# Patient Record
Sex: Female | Born: 1970
Health system: Southern US, Community
[De-identification: ages and names within clinical notes are randomized; demographics above are authoritative.]

## PROBLEM LIST (undated history)

## (undated) DIAGNOSIS — D649 Anemia, unspecified: Secondary | ICD-10-CM

## (undated) DIAGNOSIS — M1990 Unspecified osteoarthritis, unspecified site: Secondary | ICD-10-CM

## (undated) DIAGNOSIS — F329 Major depressive disorder, single episode, unspecified: Secondary | ICD-10-CM

## (undated) DIAGNOSIS — F32A Depression, unspecified: Secondary | ICD-10-CM

## (undated) DIAGNOSIS — K649 Unspecified hemorrhoids: Secondary | ICD-10-CM

## (undated) DIAGNOSIS — M797 Fibromyalgia: Secondary | ICD-10-CM

## (undated) DIAGNOSIS — R42 Dizziness and giddiness: Secondary | ICD-10-CM

## (undated) DIAGNOSIS — C50919 Malignant neoplasm of unspecified site of unspecified female breast: Secondary | ICD-10-CM

## (undated) DIAGNOSIS — D219 Benign neoplasm of connective and other soft tissue, unspecified: Secondary | ICD-10-CM

## (undated) DIAGNOSIS — Z803 Family history of malignant neoplasm of breast: Secondary | ICD-10-CM

## (undated) DIAGNOSIS — Z5189 Encounter for other specified aftercare: Secondary | ICD-10-CM

## (undated) DIAGNOSIS — IMO0001 Reserved for inherently not codable concepts without codable children: Secondary | ICD-10-CM

## (undated) DIAGNOSIS — Z923 Personal history of irradiation: Secondary | ICD-10-CM

## (undated) DIAGNOSIS — I10 Essential (primary) hypertension: Secondary | ICD-10-CM

## (undated) HISTORY — PX: LAPAROSCOPY FOR ECTOPIC PREGNANCY: SUR765

## (undated) HISTORY — PX: REDUCTION MAMMAPLASTY: SUR839

## (undated) HISTORY — DX: Dizziness and giddiness: R42

## (undated) HISTORY — PX: ABDOMINAL HYSTERECTOMY: SHX81

## (undated) HISTORY — DX: Malignant neoplasm of unspecified site of unspecified female breast: C50.919

## (undated) HISTORY — DX: Anemia, unspecified: D64.9

## (undated) HISTORY — DX: Family history of malignant neoplasm of breast: Z80.3

## (undated) HISTORY — PX: LAPAROSCOPY W/ MINI-LAPAROTOMY: SHX1939

## (undated) HISTORY — DX: Unspecified osteoarthritis, unspecified site: M19.90

## (undated) HISTORY — PX: AUGMENTATION MAMMAPLASTY: SUR837

---
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
## 1998-04-30 ENCOUNTER — Emergency Department (HOSPITAL_COMMUNITY): Admission: EM | Admit: 1998-04-30 | Discharge: 1998-04-30 | Payer: Self-pay | Admitting: Emergency Medicine

## 1999-07-08 ENCOUNTER — Other Ambulatory Visit: Admission: RE | Admit: 1999-07-08 | Discharge: 1999-07-08 | Payer: Self-pay | Admitting: Obstetrics

## 1999-11-02 HISTORY — PX: MASTECTOMY: SHX3

## 2000-02-26 ENCOUNTER — Emergency Department (HOSPITAL_COMMUNITY): Admission: EM | Admit: 2000-02-26 | Discharge: 2000-02-26 | Payer: Self-pay | Admitting: Emergency Medicine

## 2000-12-08 ENCOUNTER — Emergency Department (HOSPITAL_COMMUNITY): Admission: EM | Admit: 2000-12-08 | Discharge: 2000-12-08 | Payer: Self-pay | Admitting: Internal Medicine

## 2001-05-02 ENCOUNTER — Encounter: Payer: Self-pay | Admitting: Emergency Medicine

## 2001-05-02 ENCOUNTER — Emergency Department (HOSPITAL_COMMUNITY): Admission: EM | Admit: 2001-05-02 | Discharge: 2001-05-02 | Payer: Self-pay | Admitting: Emergency Medicine

## 2001-06-27 ENCOUNTER — Inpatient Hospital Stay (HOSPITAL_COMMUNITY): Admission: AD | Admit: 2001-06-27 | Discharge: 2001-06-27 | Payer: Self-pay | Admitting: *Deleted

## 2001-12-21 ENCOUNTER — Inpatient Hospital Stay: Admission: AD | Admit: 2001-12-21 | Discharge: 2001-12-21 | Payer: Self-pay | Admitting: *Deleted

## 2002-02-12 ENCOUNTER — Emergency Department (HOSPITAL_COMMUNITY): Admission: EM | Admit: 2002-02-12 | Discharge: 2002-02-12 | Payer: Self-pay | Admitting: Emergency Medicine

## 2002-02-12 ENCOUNTER — Encounter: Payer: Self-pay | Admitting: Emergency Medicine

## 2002-02-22 ENCOUNTER — Inpatient Hospital Stay (HOSPITAL_COMMUNITY): Admission: AD | Admit: 2002-02-22 | Discharge: 2002-02-22 | Payer: Self-pay | Admitting: Obstetrics

## 2002-07-05 ENCOUNTER — Inpatient Hospital Stay (HOSPITAL_COMMUNITY): Admission: AD | Admit: 2002-07-05 | Discharge: 2002-07-05 | Payer: Self-pay | Admitting: Family Medicine

## 2002-11-01 HISTORY — PX: RECONSTRUCTION BREAST IMMEDIATE / DELAYED W/ TISSUE EXPANDER: SUR1077

## 2002-11-01 HISTORY — PX: MASTECTOMY: SHX3

## 2003-06-15 ENCOUNTER — Emergency Department (HOSPITAL_COMMUNITY): Admission: EM | Admit: 2003-06-15 | Discharge: 2003-06-15 | Payer: Self-pay | Admitting: Emergency Medicine

## 2003-07-03 ENCOUNTER — Encounter: Admission: RE | Admit: 2003-07-03 | Discharge: 2003-07-03 | Payer: Self-pay | Admitting: Family Medicine

## 2003-07-03 ENCOUNTER — Encounter (INDEPENDENT_AMBULATORY_CARE_PROVIDER_SITE_OTHER): Payer: Self-pay | Admitting: *Deleted

## 2003-07-03 ENCOUNTER — Encounter: Payer: Self-pay | Admitting: Family Medicine

## 2003-07-04 ENCOUNTER — Encounter (INDEPENDENT_AMBULATORY_CARE_PROVIDER_SITE_OTHER): Payer: Self-pay | Admitting: *Deleted

## 2003-07-04 ENCOUNTER — Encounter: Payer: Self-pay | Admitting: Family Medicine

## 2003-07-04 ENCOUNTER — Encounter: Admission: RE | Admit: 2003-07-04 | Discharge: 2003-07-04 | Payer: Self-pay | Admitting: Family Medicine

## 2003-07-05 ENCOUNTER — Encounter: Payer: Self-pay | Admitting: Family Medicine

## 2003-07-05 HISTORY — PX: BREAST BIOPSY: SHX20

## 2003-07-09 ENCOUNTER — Encounter: Payer: Self-pay | Admitting: General Surgery

## 2003-07-09 ENCOUNTER — Encounter (HOSPITAL_COMMUNITY): Admission: RE | Admit: 2003-07-09 | Discharge: 2003-10-07 | Payer: Self-pay | Admitting: General Surgery

## 2003-07-10 ENCOUNTER — Encounter: Payer: Self-pay | Admitting: General Surgery

## 2003-07-30 ENCOUNTER — Encounter: Admission: RE | Admit: 2003-07-30 | Discharge: 2003-07-30 | Payer: Self-pay | Admitting: Family Medicine

## 2003-07-30 ENCOUNTER — Encounter: Payer: Self-pay | Admitting: Family Medicine

## 2003-07-31 ENCOUNTER — Encounter: Payer: Self-pay | Admitting: General Surgery

## 2003-08-05 ENCOUNTER — Encounter (INDEPENDENT_AMBULATORY_CARE_PROVIDER_SITE_OTHER): Payer: Self-pay | Admitting: *Deleted

## 2003-08-05 ENCOUNTER — Inpatient Hospital Stay (HOSPITAL_COMMUNITY): Admission: RE | Admit: 2003-08-05 | Discharge: 2003-08-07 | Payer: Self-pay | Admitting: General Surgery

## 2003-08-05 ENCOUNTER — Encounter: Payer: Self-pay | Admitting: General Surgery

## 2004-09-01 ENCOUNTER — Other Ambulatory Visit: Admission: RE | Admit: 2004-09-01 | Discharge: 2004-09-01 | Payer: Self-pay | Admitting: Family Medicine

## 2004-10-07 ENCOUNTER — Emergency Department (HOSPITAL_COMMUNITY): Admission: EM | Admit: 2004-10-07 | Discharge: 2004-10-07 | Payer: Self-pay | Admitting: Family Medicine

## 2005-08-30 ENCOUNTER — Encounter: Admission: RE | Admit: 2005-08-30 | Discharge: 2005-08-30 | Payer: Self-pay | Admitting: Obstetrics and Gynecology

## 2005-08-31 ENCOUNTER — Encounter: Admission: RE | Admit: 2005-08-31 | Discharge: 2005-08-31 | Payer: Self-pay | Admitting: Obstetrics and Gynecology

## 2005-09-19 ENCOUNTER — Emergency Department (HOSPITAL_COMMUNITY): Admission: EM | Admit: 2005-09-19 | Discharge: 2005-09-19 | Payer: Self-pay | Admitting: Family Medicine

## 2005-10-05 ENCOUNTER — Encounter: Admission: RE | Admit: 2005-10-05 | Discharge: 2005-10-05 | Payer: Self-pay | Admitting: Obstetrics and Gynecology

## 2005-11-12 ENCOUNTER — Encounter: Admission: RE | Admit: 2005-11-12 | Discharge: 2005-11-12 | Payer: Self-pay | Admitting: Obstetrics and Gynecology

## 2005-11-13 ENCOUNTER — Encounter: Admission: RE | Admit: 2005-11-13 | Discharge: 2005-11-13 | Payer: Self-pay | Admitting: Obstetrics and Gynecology

## 2005-11-26 ENCOUNTER — Ambulatory Visit (HOSPITAL_COMMUNITY): Admission: RE | Admit: 2005-11-26 | Discharge: 2005-11-26 | Payer: Self-pay | Admitting: Gastroenterology

## 2005-11-26 ENCOUNTER — Ambulatory Visit: Payer: Self-pay | Admitting: Oncology

## 2006-02-15 ENCOUNTER — Ambulatory Visit: Payer: Self-pay | Admitting: Oncology

## 2006-04-14 ENCOUNTER — Ambulatory Visit: Payer: Self-pay | Admitting: Oncology

## 2006-05-11 ENCOUNTER — Emergency Department (HOSPITAL_COMMUNITY): Admission: EM | Admit: 2006-05-11 | Discharge: 2006-05-11 | Payer: Self-pay | Admitting: Family Medicine

## 2006-10-10 ENCOUNTER — Ambulatory Visit (HOSPITAL_COMMUNITY): Admission: RE | Admit: 2006-10-10 | Discharge: 2006-10-10 | Payer: Self-pay | Admitting: Oncology

## 2006-10-10 ENCOUNTER — Encounter: Admission: RE | Admit: 2006-10-10 | Discharge: 2006-10-10 | Payer: Self-pay | Admitting: Oncology

## 2006-10-13 ENCOUNTER — Ambulatory Visit: Payer: Self-pay | Admitting: Oncology

## 2007-08-27 ENCOUNTER — Emergency Department (HOSPITAL_COMMUNITY): Admission: EM | Admit: 2007-08-27 | Discharge: 2007-08-27 | Payer: Self-pay | Admitting: Emergency Medicine

## 2007-12-15 ENCOUNTER — Ambulatory Visit: Payer: Self-pay | Admitting: Oncology

## 2007-12-15 LAB — COMPREHENSIVE METABOLIC PANEL
ALT: 8 U/L (ref 0–35)
AST: 21 U/L (ref 0–37)
Albumin: 4.3 g/dL (ref 3.5–5.2)
Alkaline Phosphatase: 66 U/L (ref 39–117)
BUN: 10 mg/dL (ref 6–23)
CO2: 19 mEq/L (ref 19–32)
Calcium: 9 mg/dL (ref 8.4–10.5)
Chloride: 108 mEq/L (ref 96–112)
Creatinine, Ser: 0.73 mg/dL (ref 0.40–1.20)
Glucose, Bld: 82 mg/dL (ref 70–99)
Potassium: 3.5 mEq/L (ref 3.5–5.3)
Sodium: 137 mEq/L (ref 135–145)
Total Bilirubin: 0.5 mg/dL (ref 0.3–1.2)
Total Protein: 8.3 g/dL (ref 6.0–8.3)

## 2007-12-15 LAB — IRON AND TIBC
%SAT: 5 % — ABNORMAL LOW (ref 20–55)
Iron: 22 ug/dL — ABNORMAL LOW (ref 42–145)
TIBC: 451 ug/dL (ref 250–470)
UIBC: 429 ug/dL

## 2007-12-15 LAB — CBC WITH DIFFERENTIAL/PLATELET
BASO%: 1.1 % (ref 0.0–2.0)
Basophils Absolute: 0.1 10*3/uL (ref 0.0–0.1)
EOS%: 0.8 % (ref 0.0–7.0)
Eosinophils Absolute: 0.1 10*3/uL (ref 0.0–0.5)
HCT: 29 % — ABNORMAL LOW (ref 34.8–46.6)
HGB: 8.7 g/dL — ABNORMAL LOW (ref 11.6–15.9)
LYMPH%: 24.1 % (ref 14.0–48.0)
MCH: 20.8 pg — ABNORMAL LOW (ref 26.0–34.0)
MCHC: 30 g/dL — ABNORMAL LOW (ref 32.0–36.0)
MCV: 69.4 fL — ABNORMAL LOW (ref 81.0–101.0)
MONO#: 0.3 10*3/uL (ref 0.1–0.9)
MONO%: 3.9 % (ref 0.0–13.0)
NEUT#: 5.6 10*3/uL (ref 1.5–6.5)
NEUT%: 70.1 % (ref 39.6–76.8)
Platelets: 362 10*3/uL (ref 145–400)
RBC: 4.17 10*6/uL (ref 3.70–5.32)
RDW: 14.3 % (ref 11.3–14.5)
WBC: 8 10*3/uL (ref 3.9–10.0)
lymph#: 1.9 10*3/uL (ref 0.9–3.3)

## 2007-12-15 LAB — HOLD TUBE, BLOOD BANK

## 2007-12-15 LAB — LACTATE DEHYDROGENASE: LDH: 208 U/L (ref 94–250)

## 2007-12-15 LAB — RETICULOCYTES
IRF: 0.54 — ABNORMAL HIGH (ref 0.130–0.330)
RETIC #: 118 10*3/uL — ABNORMAL HIGH (ref 19.7–115.1)
Retic %: 2.7 % — ABNORMAL HIGH (ref 0.4–2.3)

## 2007-12-15 LAB — CHCC SMEAR

## 2007-12-15 LAB — FERRITIN: Ferritin: 15 ng/mL (ref 10–291)

## 2008-01-12 LAB — CBC WITH DIFFERENTIAL/PLATELET
BASO%: 1.1 % (ref 0.0–2.0)
Basophils Absolute: 0.1 10*3/uL (ref 0.0–0.1)
EOS%: 1.3 % (ref 0.0–7.0)
Eosinophils Absolute: 0.1 10*3/uL (ref 0.0–0.5)
HCT: 31.6 % — ABNORMAL LOW (ref 34.8–46.6)
HGB: 10.3 g/dL — ABNORMAL LOW (ref 11.6–15.9)
LYMPH%: 29.1 % (ref 14.0–48.0)
MCH: 25.5 pg — ABNORMAL LOW (ref 26.0–34.0)
MCHC: 32.5 g/dL (ref 32.0–36.0)
MCV: 78.6 fL — ABNORMAL LOW (ref 81.0–101.0)
MONO#: 0.4 10*3/uL (ref 0.1–0.9)
MONO%: 5.5 % (ref 0.0–13.0)
NEUT#: 4.6 10*3/uL (ref 1.5–6.5)
NEUT%: 63 % (ref 39.6–76.8)
Platelets: 220 10*3/uL (ref 145–400)
RBC: 4.03 10*6/uL (ref 3.70–5.32)
RDW: 30.7 % — ABNORMAL HIGH (ref 11.3–14.5)
WBC: 7.3 10*3/uL (ref 3.9–10.0)
lymph#: 2.1 10*3/uL (ref 0.9–3.3)

## 2008-01-12 LAB — IRON AND TIBC
%SAT: 47 % (ref 20–55)
Iron: 156 ug/dL — ABNORMAL HIGH (ref 42–145)
TIBC: 330 ug/dL (ref 250–470)
UIBC: 174 ug/dL

## 2008-01-12 LAB — FERRITIN: Ferritin: 971 ng/mL — ABNORMAL HIGH (ref 10–291)

## 2008-01-29 ENCOUNTER — Encounter: Admission: RE | Admit: 2008-01-29 | Discharge: 2008-01-29 | Payer: Self-pay | Admitting: Obstetrics and Gynecology

## 2008-02-01 ENCOUNTER — Ambulatory Visit (HOSPITAL_COMMUNITY): Admission: RE | Admit: 2008-02-01 | Discharge: 2008-02-01 | Payer: Self-pay | Admitting: Obstetrics and Gynecology

## 2008-02-01 ENCOUNTER — Encounter (INDEPENDENT_AMBULATORY_CARE_PROVIDER_SITE_OTHER): Payer: Self-pay | Admitting: Obstetrics and Gynecology

## 2008-02-07 ENCOUNTER — Ambulatory Visit: Payer: Self-pay | Admitting: Oncology

## 2008-02-12 LAB — CBC WITH DIFFERENTIAL/PLATELET
BASO%: 0.5 % (ref 0.0–2.0)
Basophils Absolute: 0 10*3/uL (ref 0.0–0.1)
EOS%: 0.6 % (ref 0.0–7.0)
Eosinophils Absolute: 0 10*3/uL (ref 0.0–0.5)
HCT: 33.8 % — ABNORMAL LOW (ref 34.8–46.6)
HGB: 11.6 g/dL (ref 11.6–15.9)
LYMPH%: 22.6 % (ref 14.0–48.0)
MCH: 29.2 pg (ref 26.0–34.0)
MCHC: 34.3 g/dL (ref 32.0–36.0)
MCV: 85.3 fL (ref 81.0–101.0)
MONO#: 0.3 10*3/uL (ref 0.1–0.9)
MONO%: 4.7 % (ref 0.0–13.0)
NEUT#: 4.6 10*3/uL (ref 1.5–6.5)
NEUT%: 71.6 % (ref 39.6–76.8)
Platelets: 238 10*3/uL (ref 145–400)
RBC: 3.97 10*6/uL (ref 3.70–5.32)
RDW: 26.7 % — ABNORMAL HIGH (ref 11.3–14.5)
WBC: 6.4 10*3/uL (ref 3.9–10.0)
lymph#: 1.5 10*3/uL (ref 0.9–3.3)

## 2008-02-12 LAB — IRON AND TIBC
%SAT: 47 % (ref 20–55)
Iron: 132 ug/dL (ref 42–145)
TIBC: 280 ug/dL (ref 250–470)
UIBC: 148 ug/dL

## 2008-02-12 LAB — FERRITIN: Ferritin: 328 ng/mL — ABNORMAL HIGH (ref 10–291)

## 2008-02-25 ENCOUNTER — Emergency Department (HOSPITAL_COMMUNITY): Admission: EM | Admit: 2008-02-25 | Discharge: 2008-02-25 | Payer: Self-pay | Admitting: Emergency Medicine

## 2008-06-11 ENCOUNTER — Ambulatory Visit: Payer: Self-pay | Admitting: Oncology

## 2008-12-18 ENCOUNTER — Encounter: Admission: RE | Admit: 2008-12-18 | Discharge: 2008-12-18 | Payer: Self-pay | Admitting: Family Medicine

## 2009-01-28 ENCOUNTER — Other Ambulatory Visit: Admission: RE | Admit: 2009-01-28 | Discharge: 2009-01-28 | Payer: Self-pay | Admitting: Family Medicine

## 2009-02-12 ENCOUNTER — Encounter: Admission: RE | Admit: 2009-02-12 | Discharge: 2009-02-12 | Payer: Self-pay | Admitting: Family Medicine

## 2009-11-01 DIAGNOSIS — Z923 Personal history of irradiation: Secondary | ICD-10-CM

## 2009-11-01 HISTORY — DX: Personal history of irradiation: Z92.3

## 2009-11-01 HISTORY — PX: BREAST BIOPSY: SHX20

## 2010-01-12 ENCOUNTER — Encounter: Payer: Self-pay | Admitting: Family Medicine

## 2010-01-12 LAB — CONVERTED CEMR LAB: Pap Smear: NORMAL

## 2010-01-13 ENCOUNTER — Encounter: Payer: Self-pay | Admitting: Family Medicine

## 2010-01-26 ENCOUNTER — Emergency Department (HOSPITAL_COMMUNITY): Admission: EM | Admit: 2010-01-26 | Discharge: 2010-01-26 | Payer: Self-pay | Admitting: Emergency Medicine

## 2010-02-15 ENCOUNTER — Inpatient Hospital Stay (HOSPITAL_COMMUNITY): Admission: AD | Admit: 2010-02-15 | Discharge: 2010-02-15 | Payer: Self-pay | Admitting: Family Medicine

## 2010-02-17 ENCOUNTER — Ambulatory Visit (HOSPITAL_COMMUNITY): Admission: RE | Admit: 2010-02-17 | Discharge: 2010-02-17 | Payer: Self-pay | Admitting: Obstetrics and Gynecology

## 2010-02-19 ENCOUNTER — Ambulatory Visit (HOSPITAL_COMMUNITY): Admission: RE | Admit: 2010-02-19 | Discharge: 2010-02-19 | Payer: Self-pay | Admitting: Obstetrics & Gynecology

## 2010-02-21 ENCOUNTER — Ambulatory Visit (HOSPITAL_COMMUNITY): Admission: RE | Admit: 2010-02-21 | Discharge: 2010-02-21 | Payer: Self-pay | Admitting: Obstetrics & Gynecology

## 2010-02-24 ENCOUNTER — Ambulatory Visit (HOSPITAL_COMMUNITY): Admission: RE | Admit: 2010-02-24 | Discharge: 2010-02-24 | Payer: Self-pay | Admitting: Obstetrics & Gynecology

## 2010-02-27 ENCOUNTER — Ambulatory Visit (HOSPITAL_COMMUNITY): Admission: RE | Admit: 2010-02-27 | Discharge: 2010-02-27 | Payer: Self-pay | Admitting: Obstetrics & Gynecology

## 2010-03-02 ENCOUNTER — Ambulatory Visit (HOSPITAL_COMMUNITY): Admission: RE | Admit: 2010-03-02 | Discharge: 2010-03-02 | Payer: Self-pay | Admitting: Obstetrics & Gynecology

## 2010-03-04 ENCOUNTER — Ambulatory Visit: Payer: Self-pay | Admitting: Family Medicine

## 2010-03-04 DIAGNOSIS — D259 Leiomyoma of uterus, unspecified: Secondary | ICD-10-CM | POA: Insufficient documentation

## 2010-03-04 DIAGNOSIS — C50919 Malignant neoplasm of unspecified site of unspecified female breast: Secondary | ICD-10-CM | POA: Insufficient documentation

## 2010-03-04 DIAGNOSIS — D509 Iron deficiency anemia, unspecified: Secondary | ICD-10-CM | POA: Insufficient documentation

## 2010-03-04 DIAGNOSIS — O009 Unspecified ectopic pregnancy without intrauterine pregnancy: Secondary | ICD-10-CM | POA: Insufficient documentation

## 2010-03-04 DIAGNOSIS — N920 Excessive and frequent menstruation with regular cycle: Secondary | ICD-10-CM | POA: Insufficient documentation

## 2010-03-05 ENCOUNTER — Ambulatory Visit: Admission: RE | Admit: 2010-03-05 | Discharge: 2010-03-05 | Payer: Self-pay | Admitting: Obstetrics & Gynecology

## 2010-03-12 ENCOUNTER — Ambulatory Visit: Payer: Self-pay | Admitting: Obstetrics and Gynecology

## 2010-03-12 ENCOUNTER — Ambulatory Visit (HOSPITAL_COMMUNITY): Admission: RE | Admit: 2010-03-12 | Discharge: 2010-03-12 | Payer: Self-pay | Admitting: Obstetrics & Gynecology

## 2010-03-27 ENCOUNTER — Ambulatory Visit: Payer: Self-pay | Admitting: Obstetrics & Gynecology

## 2010-03-27 LAB — CONVERTED CEMR LAB: hCG, Beta Chain, Quant, S: 2 milliintl units/mL

## 2010-04-03 ENCOUNTER — Encounter: Payer: Self-pay | Admitting: Family Medicine

## 2010-04-03 ENCOUNTER — Telehealth: Payer: Self-pay | Admitting: Family Medicine

## 2010-04-14 ENCOUNTER — Ambulatory Visit: Payer: Self-pay | Admitting: Family Medicine

## 2010-04-14 ENCOUNTER — Encounter: Payer: Self-pay | Admitting: Family Medicine

## 2010-04-14 ENCOUNTER — Telehealth: Payer: Self-pay | Admitting: Family Medicine

## 2010-04-14 DIAGNOSIS — G43909 Migraine, unspecified, not intractable, without status migrainosus: Secondary | ICD-10-CM | POA: Insufficient documentation

## 2010-04-14 LAB — CONVERTED CEMR LAB
ALT: 10 units/L (ref 0–35)
AST: 23 units/L (ref 0–37)
Albumin: 3.9 g/dL (ref 3.5–5.2)
Alkaline Phosphatase: 61 units/L (ref 39–117)
BUN: 7 mg/dL (ref 6–23)
Beta hcg, urine, semiquantitative: NEGATIVE
CO2: 21 meq/L (ref 19–32)
Calcium: 8.6 mg/dL (ref 8.4–10.5)
Chloride: 107 meq/L (ref 96–112)
Cholesterol: 122 mg/dL (ref 0–200)
Creatinine, Ser: 0.74 mg/dL (ref 0.40–1.20)
Glucose, Bld: 92 mg/dL (ref 70–99)
HCT: 37.1 % (ref 36.0–46.0)
HDL: 52 mg/dL (ref >39)
Hemoglobin: 12.3 g/dL (ref 12.0–15.0)
LDL Cholesterol: 56 mg/dL (ref 0–99)
MCHC: 33.2 g/dL (ref 30.0–36.0)
MCV: 91.8 fL (ref 78.0–100.0)
Platelets: 258 10*3/uL (ref 150–400)
Potassium: 3.4 meq/L — ABNORMAL LOW (ref 3.5–5.3)
RBC: 4.04 M/uL (ref 3.87–5.11)
RDW: 14.6 % (ref 11.5–15.5)
Sodium: 137 meq/L (ref 135–145)
Total Bilirubin: 0.5 mg/dL (ref 0.3–1.2)
Total CHOL/HDL Ratio: 2.3
Total Protein: 7.5 g/dL (ref 6.0–8.3)
Triglycerides: 68 mg/dL (ref <150)
VLDL: 14 mg/dL (ref 0–40)
WBC: 7.7 10*3/uL (ref 4.0–10.5)

## 2010-04-15 ENCOUNTER — Telehealth: Payer: Self-pay | Admitting: Family Medicine

## 2010-04-15 ENCOUNTER — Encounter: Payer: Self-pay | Admitting: Family Medicine

## 2010-04-15 DIAGNOSIS — E876 Hypokalemia: Secondary | ICD-10-CM | POA: Insufficient documentation

## 2010-04-16 ENCOUNTER — Encounter: Admission: RE | Admit: 2010-04-16 | Discharge: 2010-04-16 | Payer: Self-pay | Admitting: Family Medicine

## 2010-04-22 ENCOUNTER — Encounter: Payer: Self-pay | Admitting: Family Medicine

## 2010-04-22 ENCOUNTER — Encounter: Admission: RE | Admit: 2010-04-22 | Discharge: 2010-04-22 | Payer: Self-pay | Admitting: Family Medicine

## 2010-04-22 LAB — HM MAMMOGRAPHY

## 2010-04-23 ENCOUNTER — Ambulatory Visit: Payer: Self-pay | Admitting: Oncology

## 2010-04-27 ENCOUNTER — Encounter: Payer: Self-pay | Admitting: Family Medicine

## 2010-04-27 ENCOUNTER — Encounter: Admission: RE | Admit: 2010-04-27 | Discharge: 2010-04-27 | Payer: Self-pay | Admitting: Family Medicine

## 2010-04-27 LAB — COMPREHENSIVE METABOLIC PANEL
ALT: 12 U/L (ref 0–35)
AST: 23 U/L (ref 0–37)
Albumin: 3.6 g/dL (ref 3.5–5.2)
Alkaline Phosphatase: 68 U/L (ref 39–117)
BUN: 7 mg/dL (ref 6–23)
CO2: 26 mEq/L (ref 19–32)
Calcium: 9 mg/dL (ref 8.4–10.5)
Chloride: 108 mEq/L (ref 96–112)
Creatinine, Ser: 0.81 mg/dL (ref 0.40–1.20)
Glucose, Bld: 99 mg/dL (ref 70–99)
Potassium: 3.8 mEq/L (ref 3.5–5.3)
Sodium: 139 mEq/L (ref 135–145)
Total Bilirubin: 0.5 mg/dL (ref 0.3–1.2)
Total Protein: 7.4 g/dL (ref 6.0–8.3)

## 2010-04-27 LAB — CBC WITH DIFFERENTIAL/PLATELET
BASO%: 0.6 % (ref 0.0–2.0)
Basophils Absolute: 0 10*3/uL (ref 0.0–0.1)
EOS%: 0.7 % (ref 0.0–7.0)
Eosinophils Absolute: 0 10*3/uL (ref 0.0–0.5)
HCT: 27.5 % — ABNORMAL LOW (ref 34.8–46.6)
HGB: 9.6 g/dL — ABNORMAL LOW (ref 11.6–15.9)
LYMPH%: 26.7 % (ref 14.0–49.7)
MCH: 32.2 pg (ref 25.1–34.0)
MCHC: 34.8 g/dL (ref 31.5–36.0)
MCV: 92.5 fL (ref 79.5–101.0)
MONO#: 0.3 10*3/uL (ref 0.1–0.9)
MONO%: 5.8 % (ref 0.0–14.0)
NEUT#: 3.6 10*3/uL (ref 1.5–6.5)
NEUT%: 66.2 % (ref 38.4–76.8)
Platelets: 349 10*3/uL (ref 145–400)
RBC: 2.97 10*6/uL — ABNORMAL LOW (ref 3.70–5.45)
RDW: 14.1 % (ref 11.2–14.5)
WBC: 5.4 10*3/uL (ref 3.9–10.3)
lymph#: 1.4 10*3/uL (ref 0.9–3.3)

## 2010-04-27 LAB — IRON AND TIBC
%SAT: 4 % — ABNORMAL LOW (ref 20–55)
Iron: 17 ug/dL — ABNORMAL LOW (ref 42–145)
TIBC: 382 ug/dL (ref 250–470)
UIBC: 365 ug/dL

## 2010-04-27 LAB — RETICULOCYTES
Immature Retic Fract: 15.3 % — ABNORMAL HIGH (ref 0.00–10.70)
RBC: 3.12 10*6/uL — ABNORMAL LOW (ref 3.70–5.45)
Retic %: 1.88 % — ABNORMAL HIGH (ref 0.50–1.50)
Retic Ct Abs: 58.66 10*3/uL (ref 18.30–72.70)

## 2010-04-27 LAB — FERRITIN: Ferritin: 7 ng/mL — ABNORMAL LOW (ref 10–291)

## 2010-04-27 LAB — LACTATE DEHYDROGENASE: LDH: 147 U/L (ref 94–250)

## 2010-04-28 LAB — CEA: CEA: 0.8 ng/mL (ref 0.0–5.0)

## 2010-04-28 LAB — TRANSFERRIN RECEPTOR, SOLUABLE: Transferrin Receptor, Soluble: 26.6 nmol/L

## 2010-04-28 LAB — CANCER ANTIGEN 27.29: CA 27.29: 12 U/mL (ref 0–39)

## 2010-04-29 ENCOUNTER — Telehealth: Payer: Self-pay | Admitting: Family Medicine

## 2010-04-30 ENCOUNTER — Ambulatory Visit (HOSPITAL_COMMUNITY): Admission: RE | Admit: 2010-04-30 | Discharge: 2010-04-30 | Payer: Self-pay | Admitting: Oncology

## 2010-05-01 ENCOUNTER — Ambulatory Visit: Payer: Self-pay | Admitting: Family Medicine

## 2010-05-01 DIAGNOSIS — I10 Essential (primary) hypertension: Secondary | ICD-10-CM | POA: Insufficient documentation

## 2010-05-11 ENCOUNTER — Encounter: Payer: Self-pay | Admitting: Family Medicine

## 2010-05-20 ENCOUNTER — Ambulatory Visit: Admission: RE | Admit: 2010-05-20 | Discharge: 2010-07-13 | Payer: Self-pay | Admitting: Radiation Oncology

## 2010-05-26 ENCOUNTER — Encounter: Payer: Self-pay | Admitting: Family Medicine

## 2010-05-26 ENCOUNTER — Ambulatory Visit: Payer: Self-pay | Admitting: Oncology

## 2010-05-28 ENCOUNTER — Ambulatory Visit (HOSPITAL_COMMUNITY): Admission: RE | Admit: 2010-05-28 | Discharge: 2010-05-28 | Payer: Self-pay | Admitting: General Surgery

## 2010-05-28 ENCOUNTER — Encounter: Admission: RE | Admit: 2010-05-28 | Discharge: 2010-05-28 | Payer: Self-pay | Admitting: General Surgery

## 2010-06-05 ENCOUNTER — Encounter: Payer: Self-pay | Admitting: Family Medicine

## 2010-06-08 ENCOUNTER — Encounter: Payer: Self-pay | Admitting: Family Medicine

## 2010-06-08 LAB — COMPREHENSIVE METABOLIC PANEL
ALT: 10 U/L (ref 0–35)
AST: 18 U/L (ref 0–37)
Albumin: 4.1 g/dL (ref 3.5–5.2)
Alkaline Phosphatase: 65 U/L (ref 39–117)
BUN: 12 mg/dL (ref 6–23)
CO2: 19 mEq/L (ref 19–32)
Calcium: 9.1 mg/dL (ref 8.4–10.5)
Chloride: 109 mEq/L (ref 96–112)
Creatinine, Ser: 0.77 mg/dL (ref 0.40–1.20)
Glucose, Bld: 107 mg/dL — ABNORMAL HIGH (ref 70–99)
Potassium: 4 mEq/L (ref 3.5–5.3)
Sodium: 137 mEq/L (ref 135–145)
Total Bilirubin: 0.3 mg/dL (ref 0.3–1.2)
Total Protein: 7.2 g/dL (ref 6.0–8.3)

## 2010-06-08 LAB — CBC WITH DIFFERENTIAL/PLATELET
BASO%: 0.6 % (ref 0.0–2.0)
Basophils Absolute: 0 10*3/uL (ref 0.0–0.1)
EOS%: 1.8 % (ref 0.0–7.0)
Eosinophils Absolute: 0.1 10*3/uL (ref 0.0–0.5)
HCT: 36.7 % (ref 34.8–46.6)
HGB: 12.3 g/dL (ref 11.6–15.9)
LYMPH%: 22.5 % (ref 14.0–49.7)
MCH: 31.5 pg (ref 25.1–34.0)
MCHC: 33.5 g/dL (ref 31.5–36.0)
MCV: 94.1 fL (ref 79.5–101.0)
MONO#: 0.3 10*3/uL (ref 0.1–0.9)
MONO%: 4.8 % (ref 0.0–14.0)
NEUT#: 4.8 10*3/uL (ref 1.5–6.5)
NEUT%: 70.3 % (ref 38.4–76.8)
Platelets: 214 10*3/uL (ref 145–400)
RBC: 3.9 10*6/uL (ref 3.70–5.45)
RDW: 14.8 % — ABNORMAL HIGH (ref 11.2–14.5)
WBC: 6.8 10*3/uL (ref 3.9–10.3)
lymph#: 1.5 10*3/uL (ref 0.9–3.3)
nRBC: 0 % (ref 0–0)

## 2010-06-08 LAB — IRON AND TIBC
%SAT: 18 % — ABNORMAL LOW (ref 20–55)
Iron: 55 ug/dL (ref 42–145)
TIBC: 298 ug/dL (ref 250–470)
UIBC: 243 ug/dL

## 2010-06-08 LAB — LACTATE DEHYDROGENASE: LDH: 157 U/L (ref 94–250)

## 2010-06-08 LAB — FERRITIN: Ferritin: 153 ng/mL (ref 10–291)

## 2010-06-15 LAB — CBC WITH DIFFERENTIAL/PLATELET
BASO%: 0.3 % (ref 0.0–2.0)
Basophils Absolute: 0.1 10*3/uL (ref 0.0–0.1)
EOS%: 0.6 % (ref 0.0–7.0)
Eosinophils Absolute: 0.2 10*3/uL (ref 0.0–0.5)
HCT: 36.8 % (ref 34.8–46.6)
HGB: 12.2 g/dL (ref 11.6–15.9)
LYMPH%: 7.2 % — ABNORMAL LOW (ref 14.0–49.7)
MCH: 31.9 pg (ref 25.1–34.0)
MCHC: 33.2 g/dL (ref 31.5–36.0)
MCV: 95.9 fL (ref 79.5–101.0)
MONO#: 0.7 10*3/uL (ref 0.1–0.9)
MONO%: 2.4 % (ref 0.0–14.0)
NEUT#: 25.2 10*3/uL — ABNORMAL HIGH (ref 1.5–6.5)
NEUT%: 89.5 % — ABNORMAL HIGH (ref 38.4–76.8)
Platelets: 244 10*3/uL (ref 145–400)
RBC: 3.84 10*6/uL (ref 3.70–5.45)
RDW: 15.4 % — ABNORMAL HIGH (ref 11.2–14.5)
WBC: 28.2 10*3/uL — ABNORMAL HIGH (ref 3.9–10.3)
lymph#: 2 10*3/uL (ref 0.9–3.3)

## 2010-06-22 LAB — CBC WITH DIFFERENTIAL/PLATELET
BASO%: 0.1 % (ref 0.0–2.0)
Basophils Absolute: 0 10*3/uL (ref 0.0–0.1)
EOS%: 0.1 % (ref 0.0–7.0)
Eosinophils Absolute: 0 10*3/uL (ref 0.0–0.5)
HCT: 29.6 % — ABNORMAL LOW (ref 34.8–46.6)
HGB: 9.8 g/dL — ABNORMAL LOW (ref 11.6–15.9)
LYMPH%: 12.8 % — ABNORMAL LOW (ref 14.0–49.7)
MCH: 31.2 pg (ref 25.1–34.0)
MCHC: 33.1 g/dL (ref 31.5–36.0)
MCV: 94.3 fL (ref 79.5–101.0)
MONO#: 0.4 10*3/uL (ref 0.1–0.9)
MONO%: 3 % (ref 0.0–14.0)
NEUT#: 12.5 10*3/uL — ABNORMAL HIGH (ref 1.5–6.5)
NEUT%: 84 % — ABNORMAL HIGH (ref 38.4–76.8)
Platelets: 171 10*3/uL (ref 145–400)
RBC: 3.14 10*6/uL — ABNORMAL LOW (ref 3.70–5.45)
RDW: 14.6 % — ABNORMAL HIGH (ref 11.2–14.5)
WBC: 14.9 10*3/uL — ABNORMAL HIGH (ref 3.9–10.3)
lymph#: 1.9 10*3/uL (ref 0.9–3.3)

## 2010-06-23 ENCOUNTER — Encounter: Payer: Self-pay | Admitting: Family Medicine

## 2010-06-25 ENCOUNTER — Ambulatory Visit: Payer: Self-pay | Admitting: Oncology

## 2010-06-29 ENCOUNTER — Encounter: Payer: Self-pay | Admitting: Family Medicine

## 2010-06-29 LAB — COMPREHENSIVE METABOLIC PANEL
ALT: 19 U/L (ref 0–35)
AST: 21 U/L (ref 0–37)
Albumin: 3.8 g/dL (ref 3.5–5.2)
Alkaline Phosphatase: 72 U/L (ref 39–117)
BUN: 11 mg/dL (ref 6–23)
CO2: 23 mEq/L (ref 19–32)
Calcium: 9.1 mg/dL (ref 8.4–10.5)
Chloride: 108 mEq/L (ref 96–112)
Creatinine, Ser: 0.72 mg/dL (ref 0.40–1.20)
Glucose, Bld: 95 mg/dL (ref 70–99)
Potassium: 3.8 mEq/L (ref 3.5–5.3)
Sodium: 140 mEq/L (ref 135–145)
Total Bilirubin: 0.2 mg/dL — ABNORMAL LOW (ref 0.3–1.2)
Total Protein: 6.9 g/dL (ref 6.0–8.3)

## 2010-06-29 LAB — CBC WITH DIFFERENTIAL/PLATELET
BASO%: 1 % (ref 0.0–2.0)
Basophils Absolute: 0.1 10*3/uL (ref 0.0–0.1)
EOS%: 0.1 % (ref 0.0–7.0)
Eosinophils Absolute: 0 10*3/uL (ref 0.0–0.5)
HCT: 33.9 % — ABNORMAL LOW (ref 34.8–46.6)
HGB: 11.2 g/dL — ABNORMAL LOW (ref 11.6–15.9)
LYMPH%: 19.3 % (ref 14.0–49.7)
MCH: 31.5 pg (ref 25.1–34.0)
MCHC: 33 g/dL (ref 31.5–36.0)
MCV: 95.5 fL (ref 79.5–101.0)
MONO#: 0.4 10*3/uL (ref 0.1–0.9)
MONO%: 5.5 % (ref 0.0–14.0)
NEUT#: 5.3 10*3/uL (ref 1.5–6.5)
NEUT%: 74.1 % (ref 38.4–76.8)
Platelets: 301 10*3/uL (ref 145–400)
RBC: 3.55 10*6/uL — ABNORMAL LOW (ref 3.70–5.45)
RDW: 15.3 % — ABNORMAL HIGH (ref 11.2–14.5)
WBC: 7.1 10*3/uL (ref 3.9–10.3)
lymph#: 1.4 10*3/uL (ref 0.9–3.3)

## 2010-06-29 LAB — LACTATE DEHYDROGENASE: LDH: 211 U/L (ref 94–250)

## 2010-06-30 LAB — TRANSFERRIN RECEPTOR, SOLUABLE: Transferrin Receptor, Soluble: 20.1 nmol/L

## 2010-06-30 LAB — IRON AND TIBC
%SAT: 18 % — ABNORMAL LOW (ref 20–55)
Iron: 52 ug/dL (ref 42–145)
TIBC: 297 ug/dL (ref 250–470)
UIBC: 245 ug/dL

## 2010-06-30 LAB — FERRITIN: Ferritin: 184 ng/mL (ref 10–291)

## 2010-07-20 ENCOUNTER — Encounter: Payer: Self-pay | Admitting: Family Medicine

## 2010-07-27 ENCOUNTER — Ambulatory Visit: Payer: Self-pay | Admitting: Oncology

## 2010-07-27 ENCOUNTER — Encounter: Payer: Self-pay | Admitting: Family Medicine

## 2010-07-27 LAB — CBC WITH DIFFERENTIAL/PLATELET
BASO%: 0.9 % (ref 0.0–2.0)
Basophils Absolute: 0.1 10*3/uL (ref 0.0–0.1)
EOS%: 0.2 % (ref 0.0–7.0)
Eosinophils Absolute: 0 10*3/uL (ref 0.0–0.5)
HCT: 32.4 % — ABNORMAL LOW (ref 34.8–46.6)
HGB: 10.6 g/dL — ABNORMAL LOW (ref 11.6–15.9)
LYMPH%: 9 % — ABNORMAL LOW (ref 14.0–49.7)
MCH: 31.5 pg (ref 25.1–34.0)
MCHC: 32.8 g/dL (ref 31.5–36.0)
MCV: 95.8 fL (ref 79.5–101.0)
MONO#: 0.9 10*3/uL (ref 0.1–0.9)
MONO%: 5.9 % (ref 0.0–14.0)
NEUT#: 13.3 10*3/uL — ABNORMAL HIGH (ref 1.5–6.5)
NEUT%: 84 % — ABNORMAL HIGH (ref 38.4–76.8)
Platelets: 246 10*3/uL (ref 145–400)
RBC: 3.38 10*6/uL — ABNORMAL LOW (ref 3.70–5.45)
RDW: 16.8 % — ABNORMAL HIGH (ref 11.2–14.5)
WBC: 15.8 10*3/uL — ABNORMAL HIGH (ref 3.9–10.3)
lymph#: 1.4 10*3/uL (ref 0.9–3.3)

## 2010-07-27 LAB — BASIC METABOLIC PANEL
BUN: 11 mg/dL (ref 6–23)
CO2: 25 mEq/L (ref 19–32)
Calcium: 9.3 mg/dL (ref 8.4–10.5)
Chloride: 106 mEq/L (ref 96–112)
Creatinine, Ser: 0.81 mg/dL (ref 0.40–1.20)
Glucose, Bld: 94 mg/dL (ref 70–99)
Potassium: 4 mEq/L (ref 3.5–5.3)
Sodium: 140 mEq/L (ref 135–145)

## 2010-08-03 LAB — CULTURE, BLOOD (SINGLE)

## 2010-08-10 ENCOUNTER — Encounter: Payer: Self-pay | Admitting: Family Medicine

## 2010-08-10 LAB — CBC WITH DIFFERENTIAL/PLATELET
BASO%: 1.3 % (ref 0.0–2.0)
Basophils Absolute: 0.1 10*3/uL (ref 0.0–0.1)
EOS%: 0.3 % (ref 0.0–7.0)
Eosinophils Absolute: 0 10*3/uL (ref 0.0–0.5)
HCT: 32.1 % — ABNORMAL LOW (ref 34.8–46.6)
HGB: 10.4 g/dL — ABNORMAL LOW (ref 11.6–15.9)
LYMPH%: 19 % (ref 14.0–49.7)
MCH: 31.4 pg (ref 25.1–34.0)
MCHC: 32.4 g/dL (ref 31.5–36.0)
MCV: 97 fL (ref 79.5–101.0)
MONO#: 0.6 10*3/uL (ref 0.1–0.9)
MONO%: 7.9 % (ref 0.0–14.0)
NEUT#: 5 10*3/uL (ref 1.5–6.5)
NEUT%: 71.5 % (ref 38.4–76.8)
Platelets: 299 10*3/uL (ref 145–400)
RBC: 3.31 10*6/uL — ABNORMAL LOW (ref 3.70–5.45)
RDW: 16.1 % — ABNORMAL HIGH (ref 11.2–14.5)
WBC: 7.1 10*3/uL (ref 3.9–10.3)
lymph#: 1.3 10*3/uL (ref 0.9–3.3)
nRBC: 0 % (ref 0–0)

## 2010-08-10 LAB — COMPREHENSIVE METABOLIC PANEL
ALT: 42 U/L — ABNORMAL HIGH (ref 0–35)
AST: 40 U/L — ABNORMAL HIGH (ref 0–37)
Albumin: 3.7 g/dL (ref 3.5–5.2)
Alkaline Phosphatase: 80 U/L (ref 39–117)
BUN: 12 mg/dL (ref 6–23)
CO2: 25 mEq/L (ref 19–32)
Calcium: 9 mg/dL (ref 8.4–10.5)
Chloride: 108 mEq/L (ref 96–112)
Creatinine, Ser: 0.67 mg/dL (ref 0.40–1.20)
Glucose, Bld: 112 mg/dL — ABNORMAL HIGH (ref 70–99)
Potassium: 3.8 mEq/L (ref 3.5–5.3)
Sodium: 141 mEq/L (ref 135–145)
Total Bilirubin: 0.3 mg/dL (ref 0.3–1.2)
Total Protein: 6.7 g/dL (ref 6.0–8.3)

## 2010-08-10 LAB — LACTATE DEHYDROGENASE: LDH: 309 U/L — ABNORMAL HIGH (ref 94–250)

## 2010-08-17 ENCOUNTER — Emergency Department (HOSPITAL_COMMUNITY): Admission: EM | Admit: 2010-08-17 | Discharge: 2010-08-17 | Payer: Self-pay | Admitting: Family Medicine

## 2010-08-24 ENCOUNTER — Ambulatory Visit
Admission: RE | Admit: 2010-08-24 | Discharge: 2010-11-04 | Payer: Self-pay | Source: Home / Self Care | Attending: Radiation Oncology | Admitting: Radiation Oncology

## 2010-08-25 ENCOUNTER — Encounter: Payer: Self-pay | Admitting: Family Medicine

## 2010-08-27 ENCOUNTER — Ambulatory Visit: Payer: Self-pay | Admitting: Oncology

## 2010-08-31 ENCOUNTER — Encounter: Payer: Self-pay | Admitting: Family Medicine

## 2010-08-31 LAB — CBC WITH DIFFERENTIAL/PLATELET
BASO%: 0.9 % (ref 0.0–2.0)
Basophils Absolute: 0.1 10*3/uL (ref 0.0–0.1)
EOS%: 0.4 % (ref 0.0–7.0)
Eosinophils Absolute: 0 10*3/uL (ref 0.0–0.5)
HCT: 34.3 % — ABNORMAL LOW (ref 34.8–46.6)
HGB: 11 g/dL — ABNORMAL LOW (ref 11.6–15.9)
LYMPH%: 18.3 % (ref 14.0–49.7)
MCH: 31.2 pg (ref 25.1–34.0)
MCHC: 32.1 g/dL (ref 31.5–36.0)
MCV: 97.2 fL (ref 79.5–101.0)
MONO#: 0.6 10*3/uL (ref 0.1–0.9)
MONO%: 8.2 % (ref 0.0–14.0)
NEUT#: 5.4 10*3/uL (ref 1.5–6.5)
NEUT%: 72.2 % (ref 38.4–76.8)
Platelets: 324 10*3/uL (ref 145–400)
RBC: 3.53 10*6/uL — ABNORMAL LOW (ref 3.70–5.45)
RDW: 16.6 % — ABNORMAL HIGH (ref 11.2–14.5)
WBC: 7.5 10*3/uL (ref 3.9–10.3)
lymph#: 1.4 10*3/uL (ref 0.9–3.3)

## 2010-08-31 LAB — COMPREHENSIVE METABOLIC PANEL
ALT: 37 U/L — ABNORMAL HIGH (ref 0–35)
AST: 38 U/L — ABNORMAL HIGH (ref 0–37)
Albumin: 3.6 g/dL (ref 3.5–5.2)
Alkaline Phosphatase: 76 U/L (ref 39–117)
BUN: 10 mg/dL (ref 6–23)
CO2: 27 mEq/L (ref 19–32)
Calcium: 9.3 mg/dL (ref 8.4–10.5)
Chloride: 107 mEq/L (ref 96–112)
Creatinine, Ser: 0.64 mg/dL (ref 0.40–1.20)
Glucose, Bld: 122 mg/dL — ABNORMAL HIGH (ref 70–99)
Potassium: 3.9 mEq/L (ref 3.5–5.3)
Sodium: 141 mEq/L (ref 135–145)
Total Bilirubin: 0.4 mg/dL (ref 0.3–1.2)
Total Protein: 6.4 g/dL (ref 6.0–8.3)

## 2010-08-31 LAB — LACTATE DEHYDROGENASE: LDH: 287 U/L — ABNORMAL HIGH (ref 94–250)

## 2010-09-29 ENCOUNTER — Ambulatory Visit: Payer: Self-pay | Admitting: Oncology

## 2010-10-01 ENCOUNTER — Encounter: Payer: Self-pay | Admitting: Family Medicine

## 2010-11-05 ENCOUNTER — Encounter: Payer: Self-pay | Admitting: Family Medicine

## 2010-11-11 ENCOUNTER — Encounter: Payer: Self-pay | Admitting: Family Medicine

## 2010-11-11 ENCOUNTER — Ambulatory Visit: Payer: Self-pay | Admitting: Oncology

## 2010-11-11 ENCOUNTER — Ambulatory Visit: Admission: RE | Admit: 2010-11-11 | Discharge: 2010-11-11 | Payer: Self-pay | Source: Home / Self Care

## 2010-11-11 DIAGNOSIS — R609 Edema, unspecified: Secondary | ICD-10-CM | POA: Insufficient documentation

## 2010-11-11 LAB — CONVERTED CEMR LAB
ALT: 24 units/L (ref 0–35)
AST: 31 units/L (ref 0–37)
Albumin: 4.4 g/dL (ref 3.5–5.2)
Alkaline Phosphatase: 79 units/L (ref 39–117)
BUN: 12 mg/dL (ref 6–23)
CO2: 25 meq/L (ref 19–32)
Calcium: 10.3 mg/dL (ref 8.4–10.5)
Chloride: 105 meq/L (ref 96–112)
Creatinine, Ser: 0.72 mg/dL (ref 0.40–1.20)
Glucose, Bld: 101 mg/dL — ABNORMAL HIGH (ref 70–99)
HCT: 41.1 % (ref 36.0–46.0)
Hemoglobin: 13.2 g/dL (ref 12.0–15.0)
MCHC: 32.1 g/dL (ref 30.0–36.0)
MCV: 92.4 fL (ref 78.0–100.0)
Platelets: 272 10*3/uL (ref 150–400)
Potassium: 3.9 meq/L (ref 3.5–5.3)
RBC: 4.45 M/uL (ref 3.87–5.11)
RDW: 13.7 % (ref 11.5–15.5)
Sodium: 140 meq/L (ref 135–145)
TSH: 0.872 microintl units/mL (ref 0.350–4.500)
Total Bilirubin: 0.6 mg/dL (ref 0.3–1.2)
Total CK: 230 units/L — ABNORMAL HIGH (ref 7–177)
Total Protein: 8.2 g/dL (ref 6.0–8.3)
WBC: 5.3 10*3/uL (ref 4.0–10.5)

## 2010-11-12 ENCOUNTER — Encounter: Payer: Self-pay | Admitting: Family Medicine

## 2010-11-12 LAB — COMPREHENSIVE METABOLIC PANEL
ALT: 19 U/L (ref 0–35)
AST: 29 U/L (ref 0–37)
Albumin: 4.1 g/dL (ref 3.5–5.2)
Alkaline Phosphatase: 71 U/L (ref 39–117)
BUN: 11 mg/dL (ref 6–23)
CO2: 24 mEq/L (ref 19–32)
Calcium: 9.5 mg/dL (ref 8.4–10.5)
Chloride: 106 mEq/L (ref 96–112)
Creatinine, Ser: 0.74 mg/dL (ref 0.40–1.20)
Glucose, Bld: 103 mg/dL — ABNORMAL HIGH (ref 70–99)
Potassium: 3.9 mEq/L (ref 3.5–5.3)
Sodium: 139 mEq/L (ref 135–145)
Total Bilirubin: 0.6 mg/dL (ref 0.3–1.2)
Total Protein: 7.6 g/dL (ref 6.0–8.3)

## 2010-11-12 LAB — CBC WITH DIFFERENTIAL/PLATELET
BASO%: 0.8 % (ref 0.0–2.0)
Basophils Absolute: 0 10*3/uL (ref 0.0–0.1)
EOS%: 1.9 % (ref 0.0–7.0)
Eosinophils Absolute: 0.1 10*3/uL (ref 0.0–0.5)
HCT: 36.6 % (ref 34.8–46.6)
HGB: 12.6 g/dL (ref 11.6–15.9)
LYMPH%: 28.3 % (ref 14.0–49.7)
MCH: 30.8 pg (ref 25.1–34.0)
MCHC: 34.4 g/dL (ref 31.5–36.0)
MCV: 89.7 fL (ref 79.5–101.0)
MONO#: 0.3 10*3/uL (ref 0.1–0.9)
MONO%: 8.5 % (ref 0.0–14.0)
NEUT#: 2.3 10*3/uL (ref 1.5–6.5)
NEUT%: 60.5 % (ref 38.4–76.8)
Platelets: 244 10*3/uL (ref 145–400)
RBC: 4.09 10*6/uL (ref 3.70–5.45)
RDW: 14 % (ref 11.2–14.5)
WBC: 3.8 10*3/uL — ABNORMAL LOW (ref 3.9–10.3)
lymph#: 1.1 10*3/uL (ref 0.9–3.3)

## 2010-11-12 LAB — SEDIMENTATION RATE: Sed Rate: 12 mm/hr (ref 0–22)

## 2010-11-12 LAB — LACTATE DEHYDROGENASE: LDH: 190 U/L (ref 94–250)

## 2010-11-12 LAB — CK: Total CK: 222 U/L — ABNORMAL HIGH (ref 7–177)

## 2010-11-12 LAB — C-REACTIVE PROTEIN: CRP: 0.1 mg/dL (ref <0.6)

## 2010-11-20 ENCOUNTER — Encounter: Payer: Self-pay | Admitting: Family Medicine

## 2010-11-21 ENCOUNTER — Encounter: Payer: Self-pay | Admitting: Obstetrics and Gynecology

## 2010-11-22 ENCOUNTER — Encounter: Payer: Self-pay | Admitting: Gastroenterology

## 2010-11-22 ENCOUNTER — Encounter: Payer: Self-pay | Admitting: Obstetrics and Gynecology

## 2010-11-25 ENCOUNTER — Ambulatory Visit: Admit: 2010-11-25 | Payer: Self-pay

## 2010-12-01 NOTE — Consult Note (Signed)
Summary: [facility]-recurrent breast Ca  [facility]   Imported By: Clydell Hakim 05/28/2010 16:22:38  _____________________________________________________________________  External Attachment:    Type:   Image     Comment:   External Document  Appended Document: [facility]-recurrent breast Ca pt going to go through chemotherapy, then tamoxifen.  local excision of new ca.   Clinical Lists Changes  Problems: Changed problem from DUCTAL CARCINOMA IN SITU, LEFT BREAST (ICD-233.0) to MALIGNANT NEOPLASM OF BREAST UNSPECIFIED SITE (ICD-174.9) - with recurrance Removed problem of BREAST MASS (ICD-611.72) Removed problem of ELEVATED BLOOD PRESSURE WITHOUT DIAGNOSIS OF HYPERTENSION (ICD-796.2)

## 2010-12-01 NOTE — Consult Note (Signed)
Summary: [facility]C  [facility]   Imported By: De Nurse 07/14/2010 09:30:22  _____________________________________________________________________  External Attachment:    Type:   Image     Comment:   External Document

## 2010-12-01 NOTE — Assessment & Plan Note (Signed)
Summary: patient not seen   Primary Care Provider:  Romero Belling, MD   History of Present Illness: Patient not seen by me today.  Does not appear that she even had vitals checked.  Unclear to me if she came to clinic or not.  Review of appointments indicates she cancelled for today and rescheduled for later this month.  Allergies: No Known Drug Allergies   Complete Medication List: 1)  High Potency Iron 86 (27 Fe) Mg Caps (Ferrous fumarate) .Marland Kitchen.. 1 tab by mouth two times a day for anemia

## 2010-12-01 NOTE — Letter (Signed)
Summary: Cancer Center Referral  [facility]  [address]   [place], [place] [address]   Phone: [phone]  Fax: [phone]    04/03/2010  Clydene Pugh [address] [place], [place]  [address]  Dear Ms. Raul Del,  I have made a referral to the cancer center so you can re-establish with them to continue regular follow up of your breast cancer.  My office should call you within the week to let you know your next appointment time at the cancer center.  If you do not hear from [place] before June 10, please call my office to ask for your cancer center appointment time.  I look forward to seeing at your regularly schedule appointment which I see is made for later this month.  Sincerely, Romero Belling MD  Appended Document: Cancer Center Referral mailed letter to pt

## 2010-12-01 NOTE — Assessment & Plan Note (Signed)
Summary: NP,tcb   Vital Signs:  Patient profile:   40 year old female Height:      63 inches Weight:      141.4 pounds BMI:     25.14 Temp:     98.2 degrees F oral Pulse rate:   66 / minute BP sitting:   134 / 86  (left arm) Cuff size:   regular  Vitals Entered By: Garen Grams LPN (Mar 04, 1609 9:22 AM) CC: New Patient Is Patient Diabetic? No Pain Assessment Patient in pain? no        Primary Care Provider:  Romero Belling, MD  CC:  New Patient.  History of Present Illness: 40 yo female here to establish care since losing her insurance.  Formerly seen by Dr. Cherly Hensen at [facility] and Dr. Cliffton Asters at [facility].  ECTOPIC PREGNANCY (ICD-633.90):  Followed by [facility].  Received MTX x2 (04/23, 04/29) and is having beta-hCG followed; last checked 2 days ago and is starting to decline, next check tomorrow.  This is her second ectopic pregnancy.  She is Rh negative and has received Rhogam this time around, unclear if she did last time.  She does desire pregnancy.  FIBROIDS, UTERUS (ICD-218.9) / MENORRHAGIA (ICD-626.2)  ANEMIA, IRON DEFICIENCY (ICD-280.9):  Has had a D&C for this.  Is taking iron.  Last Hgb 10.7 (04/29).  DUCTAL CARCINOMA IN SITU, LEFT BREAST (ICD-233.0):  s/p mastectomy and reconstruction.  No chemo/radiation.  Followed by [facility], though has lapsed in care there since losing her insurance.  Gets annual mammogram and was to have MRI every other year, though she is behind on that.   Habits & Providers  Alcohol-Tobacco-Diet     Tobacco Status: never  Current Problems (verified): 1)  Ectopic Pregnancy  (ICD-633.90) 2)  Fibroids, Uterus  (ICD-218.9) 3)  Ductal Carcinoma in Situ, Left Breast  (ICD-233.0)  Current Medications (verified): 1)  High Potency Iron 86 (27 Fe) Mg Caps (Ferrous Fumarate) .Marland Kitchen.. 1 Tab By Mouth Two Times A Day For Anemia  Allergies (verified): No Known Drug Allergies  Past History:  Past Medical  History: G2P0010 - 2x ectopic pregnancy (1997, 2011).  Past Surgical History: DCIS, left breast, 2004.  s/p complete masectomy with reconstruction x3.  Has implant LEFT breast.  No radiation or chemotherapy. RIGHT salpingectomy 1997 secondary to ectopic pregnancy D&C 01/2008 for fibroids.  Family History: Mother alive (b 60):  HTN, cervical cancer Father alive (b 58):  HTN, pulmonary HTN, DVTs, PEs Sister:  lung cancer Brother and other sister: alive and healthy  Social History: Lives in [place] with boyfriend Evadne Ose (b [id number]).  Neither has children.  Has a Rottweiler named Endwell.  Never smoked.  Rare EtOH, no illicit drugs.  Recently unemployed, former worked in Museum/gallery curator in Teaching laboratory technician.Smoking Status:  never  Physical Exam  Additional Exam:  VITALS:  Reviewed, normal GEN: Alert & oriented, no acute distress NECK: Midline trachea, no masses/thyromegaly, no cervical lymphadenopathy CARDIO: Regular rate and rhythm, no murmurs/rubs/gallops, 2+ bilateral radial pulses RESP: Clear to auscultation, normal work of breathing, no retractions/accessory muscle use ABD: Normoactive bowel sounds, nontender, no masses/hepatosplenomegaly EXT: Nontender, no edema SKIN: Intact, no lesions EYES:  No corneal or conjunctival inflammation noted. EOMI. PERRLA.  Vision grossly normal. EARS:  External ear without significant lesions or deformities.  Clear canals, TM intact bilaterally without bulging, retraction, inflammation or discharge. Hearing grossly normal bilaterally. NOSE:  Nasal mucosa are pink and moist  without lesions or exudates. MOUTH:  Oral mucosa and oropharynx without lesions or exudates.    Impression & Recommendations:  Problem # 1:  ECTOPIC PREGNANCY (ICD-633.90) Assessment Improved Followed by [facility].  Beta-hCG declining.  s/p MTX x2.  Problem # 2:  FIBROIDS, UTERUS (ICD-218.9) Assessment: Unchanged Not an active issue at this point.   Will re-evaluate in 1 month.  Problem # 3:  MENORRHAGIA (ICD-626.2) Assessment: Unchanged Secondary to fibroids, above.  Problem # 4:  ANEMIA, IRON DEFICIENCY (ICD-280.9) Assessment: Unchanged Secondary to menorrhagia, above.  Hgb 10.7.  Continue iron repletion. Her updated medication list for this problem includes:    High Potency Iron 86 (27 Fe) Mg Caps (Ferrous fumarate) .Marland Kitchen... 1 tab by mouth two times a day for anemia  Problem # 5:  DUCTAL CARCINOMA IN SITU, LEFT BREAST (ICD-233.0) Assessment: Improved Apparently in remission.  Will need continued surveillance.  Have referred for mammogram today.  Will need to re-establish with [facility] once she qualifies with Rudell Cobb.  Problem # 6:  Preventive Health Care (ICD-V70.0) Assessment: Comment Only Declines Pap today--RTC 1 month for this. FLP ordered for next week. Referred for mammo (h/o DCIS).  Complete Medication List: 1)  High Potency Iron 86 (27 Fe) Mg Caps (Ferrous fumarate) .Marland Kitchen.. 1 tab by mouth two times a day for anemia  Other Orders: Future Orders: Comp Met-[facility] ([id number]) ... 03/05/2011 Lipid-[facility] ([id number]) ... 03/05/2011  Patient Instructions: 1)  A pleasure to meet you today, Ms. Raul Del. 2)  Please see Rudell Cobb to qualify for reduced or free medical services within the [facility].  Call her at [phone] today. 3)  Please schedule an appointment to have your mammogram as soon as possible.  You can do this by calling either [facility] [phone] or [facility] [place] [phone]). 4)  Please schedule a follow-up appointment in 1 month for Pap. 5)  Schedule a lab visit for first thing the morning to check cholesterol 1 week prior to visit.

## 2010-12-01 NOTE — Progress Notes (Signed)
Summary: Ensure breast cancer follow up  Phone Note Outgoing Call Call back at [facility] Phone [phone]   Call placed by: Romero Belling MD,  April 29, 2010 11:47 AM Call placed to: Patient Summary of Call: Called to ensure that she is getting follow up of breast cancer.  No answer.  Left message requesting her to call me back at [phone].  Did not leave details of why I was calling.  Follow-up for Phone Call        Called patient again.  She has appropriate follow up for breast cancer.  Has had appointments with oncologist and surgeon.  She states her blood at surgeon's office:  150/90. Blood pressure at oncologist:  137/97. Headaches are getting worse. Also with Hgb 7.0 -- scheduled for infusions this Friday and next. She has appointment scheduled at [facility] for next week, but is requesting appointment for headaches soone.  Will forward to triage nurse to see if workin appointment can be made for today or tomorrow--patient has multiple doctor appointments already in the next few days for cancer, so scheduling may be challenging. Follow-up by: Romero Belling MD,  April 29, 2010 12:46 PM  Additional Follow-up for Phone Call Additional follow up Details #1::        she is unable to make it until fri at 8:30am due to her multiple appts. to call for earlier one if her schedule changes Additional Follow-up by: Golden Circle RN,  April 29, 2010 2:26 PM

## 2010-12-01 NOTE — Progress Notes (Signed)
Summary: Negative UPT  Phone Note Outgoing Call Call back at [phone]   Call placed by: Romero Belling MD,  April 14, 2010 10:15 AM Summary of Call: Patient left before UPT back.  Called and informed that it is negative.

## 2010-12-01 NOTE — Consult Note (Signed)
Summary: [facility]  [facility]   Imported By: Clydell Hakim 06/04/2010 14:13:23  _____________________________________________________________________  External Attachment:    Type:   Image     Comment:   External Document

## 2010-12-01 NOTE — Consult Note (Signed)
Summary: [facility]   Imported By: De Nurse 07/03/2010 12:19:10  _____________________________________________________________________  External Attachment:    Type:   Image     Comment:   External Document

## 2010-12-01 NOTE — Letter (Signed)
Summary: Lipid Letter  [facility]  [address]   [place], [place] [address]   Phone: [phone]  Fax: [phone]    04/15/2010  Victoria Delacruz [address] [place], [place]  [address]  Dear [name]:  We have carefully reviewed your last lipid profile from 04/14/2010 and the results are noted below with a summary of recommendations for lipid management.    Cholesterol:       122     Goal: <200   HDL "good" Cholesterol:   52     Goal: >40   LDL "bad" Cholesterol:   56     Goal: <130   Triglycerides:       68     Goal: <150    As we discussed, you cholesterol looks terrific--no need for medicine.  Following are the results of your recent test(s):    Electrolytes -- Low potassium, start potassium supplement as we discussed and follow up in 1 month.   Blood Sugar -- normal   Kidney Function -- normal   Liver Function -- normal   Blood Counts -- normal (hemoglobin = 12.3)  As we discussed on the phone, you do not have anemia, but should continue your iron supplement.  Please call my office if you have any questions.  Regards, Madlyn Frankel. Constance Goltz, MD  Appended Document: Lipid Letter mailed.

## 2010-12-01 NOTE — Consult Note (Signed)
[facility]   Imported By: De Nurse 08/19/2010 13:40:01  _____________________________________________________________________  External Attachment:    Type:   Image     Comment:   External Document

## 2010-12-01 NOTE — Progress Notes (Signed)
Summary: Confirm diagnostic mammogram, inform hypokalemia  Phone Note Outgoing Call Call back at [facility] Phone [phone]   Call placed by: Romero Belling MD,  April 15, 2010 8:55 AM Call placed to: Patient Summary of Call: Confirmed that patient has appointment for tomorrow for diagnostic mammogram. Discussed mild hypokalemia--will send Rx for 1 month of KCl supplement, she'll make appointment to f/u 1 month. CMet otherwise WNL. FLP excellent.  New Problems: HYPOKALEMIA, MILD (ICD-276.8)   New Problems: HYPOKALEMIA, MILD (ICD-276.8) New/Updated Medications: POTASSIUM CHLORIDE CR 10 MEQ CR-TABS (POTASSIUM CHLORIDE) 1 tab by mouth daily Prescriptions: POTASSIUM CHLORIDE CR 10 MEQ CR-TABS (POTASSIUM CHLORIDE) 1 tab by mouth daily  #30 x 0   Entered and Authorized by:   Romero Belling MD   Signed by:   Romero Belling MD on 04/15/2010   Method used:   Faxed to ...       [facility] Department (retail)       [address] [place], [place]  [address]       Ph: [phone]       Fax: [phone]   RxID:   [id number]     Impression & Recommendations:  Problem # 1:  HYPOKALEMIA, MILD (ICD-276.8) Assessment New K 3.4 noted incidentally on BMet.  Unknown etiology, no known GI losses, not on diuretic.  Replete with KCl 10 mEq by mouth daily.  Recheck in 1 month.  Prescription sent, patient informed and agrees with plan.  Complete Medication List: 1)  High Potency Iron 86 (27 Fe) Mg Caps (Ferrous fumarate) .Marland Kitchen.. 1 tab by mouth two times a day for anemia 2)  Potassium Chloride Cr 10 Meq Cr-tabs (Potassium chloride) .Marland Kitchen.. 1 tab by mouth daily   Allergies: No Known Drug Allergies

## 2010-12-01 NOTE — Miscellaneous (Signed)
  Clinical Lists Changes  Problems: Added new problem of MASTECTOMY, LEFT, HX OF (ICD-V45.71) - partial  pt had partial Left mastectomy  getting chemo and radiation will need 5 years tamoxifen

## 2010-12-01 NOTE — Miscellaneous (Signed)
Summary: Referral to cancer center     Impression & Recommendations:  Problem # 1:  DUCTAL CARCINOMA IN SITU, LEFT BREAST (ICD-233.0) Assessment Comment Only  Review of chart indicates patient is now established with Rudell Cobb.  Will make referral now to cancer center to have patient re-establish follow up.  Referral forwarded to blue team.  Orders: Oncology Referral (Oncology)  Complete Medication List: 1)  High Potency Iron 86 (27 Fe) Mg Caps (Ferrous fumarate) .Marland Kitchen.. 1 tab by mouth two times a day for anemia

## 2010-12-01 NOTE — Assessment & Plan Note (Signed)
Summary: [name]/KH   Vital Signs:  Patient profile:   40 year old female Weight:      140.7 pounds Temp:     97.7 degrees F Pulse rate:   75 / minute BP sitting:   137 / 92  (right arm)  Vitals Entered By: Starleen Blue RN (April 14, 2010 8:57 AM)  CC: cpe Is Patient Diabetic? No Pain Assessment Patient in pain? no        Primary Care Provider:  Romero Belling, MD  CC:  cpe.  History of Present Illness: 40 yo female presenting for [name]:  BREAST MASS (ICD-611.72) / DUCTAL CARCINOMA IN SITU, LEFT BREAST (ICD-233.0) Patient is s/p LEFT mastectomy for DCIS, and has felt nodule on LEFT breast for 1 year.  Lost to care, but recently re-established with the cancer center and has next oncology appointment on June 27.  ELEVATED BLOOD PRESSURE WITHOUT DIAGNOSIS OF HYPERTENSION (ICD-796.2) No prior elevated BPs.  Intermittent dyspnea, no chest pain, LE edema.  RUQ PAIN (ICD-789.01) Duration 1 day, intermittent, mild to moderate, sharp, no pain now.  Period is to start in 2 days, and this is similar in character to her premenstrual pain.  It is not like the pain she had with the ectopic pregnancy.  MIGRAINE HEADACHE (ICD-346.90) Duration 1 year.  Intermittent, bilateral temporal and parietal (L>R), occasionally daily.  Associated with nausea and photophobia.  At worst are 10/10, currently 4/10.  Denies focal weaknes, numbness, vision changes.  Some relief from OTC Motrin, Exedrine.  Does not smoke or drink EtOH.  Drinks a lot of caffeinated sodas and ice tea.  ECTOPIC PREGNANCY (ICD-633.90) HCGs followed by [facility] clinic, and they are coming down per patient.  No using contraception other than condoms.  Next [facility] appointment is July 1, 10 am.  FIBROIDS, UTERUS (ICD-218.9) / MENORRHAGIA (ICD-626.2) / ANEMIA, IRON DEFICIENCY (ICD-280.9) Previously followed by gynecology for this and has had a D&C for it.  Periods remain heavy for years, last 5 days at a time.  LMP 05/19.  Endorses intermittent  (nearly daily) dizziness without LOC or falls, has good energy except during periods.  Takes iron sporadically because it makes her constipated.  PREVENTION FLP today.  Had pap in March at community screening--normal.  Habits & Providers  Alcohol-Tobacco-Diet     Tobacco Status: never  Allergies: No Known Drug Allergies  Physical Exam  Additional Exam:  VITALS:  Reviewed, high diastolic pressure GEN: Alert & oriented, no acute distress NECK: Midline trachea, no masses/thyromegaly, no cervical lymphadenopathy CARDIO: Regular rate and rhythm, no murmurs/rubs/gallops, 2+ bilateral radial pulses RESP: Clear to auscultation, normal work of breathing, no retractions/accessory muscle use ABD: Normoactive bowel sounds, nontender, no masses/hepatosplenomegaly EXT: Nontender, no edema NEURO:  CN II-XII intact, 2+ DTRs EYES:  No corneal or conjunctival inflammation noted. EOMI. PERRLA.  Vision grossly normal. EARS:  External ear without significant lesions or deformities.  Clear canals, TM intact bilaterally without bulging, retraction, inflammation or discharge. Hearing grossly normal bilaterally. NOSE:  Nasal mucosa are pink and moist without lesions or exudates. MOUTH:  Oral mucosa and oropharynx without lesions or exudates. BREAST:  LEFT breast with 4 mm firm mobile nodule upper outer quadrant (approximately 2-3 o'clock)   Impression & Recommendations:  Problem # 1:  BREAST MASS (ICD-611.72) Assessment New Patient identified this mass 1 year ago and mentions it to me today. It is palpable on the LEFT breast, upper outer quadrant at approximately 2-3 o'clock.  History of breast cancer in  that same breast, s/p mastectomy.  Will arrange evaluation of breast mass and call patient back at :  [phone]  Orders: Mammogram (Diagnostic) (Mammo) [facility] - Est  18-39 yrs ([id number])  Problem # 2:  DUCTAL CARCINOMA IN SITU, LEFT BREAST (ICD-233.0) Assessment: Unchanged She has been lost to follow  up for some time.  Have helped patient re-establish care with cancer center.  Next appointment 06/27.  Problem # 3:  ELEVATED BLOOD PRESSURE WITHOUT DIAGNOSIS OF HYPERTENSION (ICD-796.2) No prior elevated BP.  Mildly elevated today at 137/92.  Will check electrolytes, kidney function, and lipids.  If next BP is high will change diagnosis to hypertension and treat accordingly. Orders: Comp Met-[facility] [id number]) Lipid-[facility] ([id number]) [facility] - Est  18-39 yrs ([id number])  Problem # 4:  RUQ PAIN (ICD-789.01) Assessment: New Similar in character to prior premenstrual pain and is about to start period.  Currently pain free and nontender abdomen.  Likely intermittent premenstrual pain, but given that she has h/o ectopic pregnancy did check UPT which was negative and gave red flags to seek medical attention.  Problem # 5:  MIGRAINE HEADACHE (ICD-346.90) Assessment: New Normal exam today and without red flags on history.  Given multiple other complaints today, have deferred further evaluation until next visit.  If persists, would consider using beta blocker for prophylaxis given borderline blood pressure.  Advised to continue OTC medications for relief as needed.  Problem # 6:  ECTOPIC PREGNANCY (ICD-633.90) Assessment: Unchanged Followed by [facility] clinic, and her next appointment there is July 1.  Negative UPT in clinic today.  Using condoms for contraception. s/p MTX treatment at [facility]. Orders: CBC-[facility] ([id number]) U Preg-[facility] ([id number])  Problem # 7:  MENORRHAGIA (ICD-626.2) Assessment: Unchanged Chronic, stable.  Apparently has fibroids and has been treated with D&C.  Periods last 5 days but are heavy.  Will check CBC today.  Problem # 8:  ANEMIA, IRON DEFICIENCY (ICD-280.9) Assessment: Unchanged  Patinet with history of anemia secondary to menorrhagia.   Iron constipates her.  Advised to use Docusate.  Check CBC today. Her updated medication list for this problem includes:    High Potency Iron 86 (27 Fe)  Mg Caps (Ferrous fumarate) .Victoria Delacruz... 1 tab by mouth two times a day for anemia  Orders: [facility] - Est  18-39 yrs ([id number])  Problem # 9:  Preventive Health Care (ICD-V70.0) Assessment: Comment Only Had pap at March community screening which was normal. Check FLP today.  Complete Medication List: 1)  High Potency Iron 86 (27 Fe) Mg Caps (Ferrous fumarate) .Victoria Delacruz.. 1 tab by mouth two times a day for anemia 2)  Potassium Chloride Cr 10 Meq Cr-tabs (Potassium chloride) .Victoria Delacruz.. 1 tab by mouth daily  Patient Instructions: 1)  Pleasure to see you today. 2)  Your next Pap test is due in March, please schedule an appointment for then. 3)  Okay to take Docusate 100 mg by mouth daily as a stool softener if you get constipated with the iron. 4)  Seek immediate medical attention if you develop worsening abdominal pain. 5)  Please schedule a follow-up appointment in 1 month.   Laboratory Results   Urine Tests  Date/Time Received: April 14, 2010 10:05 AM  Date/Time Reported: April 14, 2010 10:14 AM     Urine HCG: negative Comments: ...........test performed by...........Victoria KitchenTerese Door, CMA

## 2010-12-01 NOTE — Consult Note (Signed)
Summary[facility]   Imported By: De Nurse 08/27/2010 14:19:09  _____________________________________________________________________  External Attachment:    Type:   Image     Comment:   External Document

## 2010-12-01 NOTE — Consult Note (Signed)
Summary: [facility]   Imported By: De Nurse 08/06/2010 14:49:19  _____________________________________________________________________  External Attachment:    Type:   Image     Comment:   External Document

## 2010-12-01 NOTE — Consult Note (Signed)
Summary: [facility]  [facility]   Imported By: Clydell Hakim 05/06/2010 16:02:14  _____________________________________________________________________  External Attachment:    Type:   Image     Comment:   External Document

## 2010-12-01 NOTE — Progress Notes (Signed)
Summary: Cancer Center Referral  Phone Note Outgoing Call Call back at [facility] Phone [phone]   Call placed by: Romero Belling MD,  April 03, 2010 5:51 PM Call placed to: Patient Summary of Call: Called to inform patient that oncology referral is in process.  No answer.  Did not leave message.  Will send letter.

## 2010-12-01 NOTE — Miscellaneous (Signed)
Summary: Community Cervical Cancer Screening  Received notification that patient had Community Cervical Cancer Screening on 01/12/2010. Cytology Report:  satisfactory for evaluation, endocervical/transformation zone component PRESENT.  NEGATIVE FOR INTRAEPITHELIAL LESIONS OR MALIGNANCY.  Will have report scanned into chart. Abnormal findings on exam:  mass on RIGHT uterus palpated on exam.  Patient with history of fibroids.  PAP Result Date:  01/12/2010 PAP Result:  normal PAP Next Due:  1 yr

## 2010-12-01 NOTE — Consult Note (Signed)
[facility]  [facility]   Imported By: Clydell Hakim 06/25/2010 11:18:55  _____________________________________________________________________  External Attachment:    Type:   Image     Comment:   External Document

## 2010-12-01 NOTE — Consult Note (Signed)
Summary: [facility]   Imported By: De Nurse 09/09/2010 15:10:33  _____________________________________________________________________  External Attachment:    Type:   Image     Comment:   External Document

## 2010-12-01 NOTE — Consult Note (Signed)
Summary: [facility]   Imported By: De Nurse 09/10/2010 11:23:26  _____________________________________________________________________  External Attachment:    Type:   Image     Comment:   External Document

## 2010-12-01 NOTE — Assessment & Plan Note (Signed)
Summary: BP & HA/[place]   Vital Signs:  Patient profile:   40 year old female Weight:      146 pounds Temp:     98.2 degrees F oral Pulse rate:   79 / minute Pulse rhythm:   regular BP sitting:   145 / 90  (left arm) Cuff size:   regular  Vitals Entered By: Loralee Pacas CMA (May 01, 2010 8:41 AM) CC: BP/HA x 12month, Headache Is Patient Diabetic? No Pain Assessment Patient in pain? no     Location: head Intensity: 2 Comments pt has been dx with breast ca. she has been suffering the last month with headaches.  she was told by dr. Constance Goltz that she needed something for her bp and this would probably help with the headaches.   Primary Provider:  Romero Belling, MD  CC:  BP/HA x 12month and Headache.  History of Present Illness: Pt. comes today with elevated BP along with HA x1 month.    BP:For her BP she states it has been elevated on checks at home in the 140's/90's range.  She has been told in the past that her bp may be contributing to her ha.  She denies any chest pain or palpitations at this time.  Does admit to increased stress d/t tubal pregnancy and breast ca reoccurence.  HA:She describes pain as constant in the fronto-parietal region with L>R pain.  She does have associated photophobia, phonophobia and nausea.  For the ha she has taken excedrin which has helped some.  She has had migraines in the past but does not remember what medication she has taken before.  She denies any focal neurological deficits including numbness, weakness, vision changes.     Habits & Providers  Alcohol-Tobacco-Diet     Tobacco Status: never  Current Medications (verified): 1)  High Potency Iron 86 (27 Fe) Mg Caps (Ferrous Fumarate) .Marland Kitchen.. 1 Tab By Mouth Two Times A Day For Anemia 2)  Potassium Chloride Cr 10 Meq Cr-Tabs (Potassium Chloride) .Marland Kitchen.. 1 Tab By Mouth Daily 3)  Metoprolol Tartrate 25 Mg Tabs (Metoprolol Tartrate) .Marland Kitchen.. 1 Tab By Mouth Two Times A Day For Hypertension 4)  Imitrex  25 Mg Tabs (Sumatriptan Succinate) .Marland Kitchen.. 1 Tab By Mouth May Repeat 1 Tab If Headache Still Present After 2 Hours.  Do Not Exceed 4 Tabs in 24 Hours  Allergies (verified): No Known Drug Allergies  Past History:  Family History: Last updated: 03/04/2010 Mother alive (b 88):  HTN, cervical cancer Father alive (b 50):  HTN, pulmonary HTN, DVTs, PEs Sister:  lung cancer Brother and other sister: alive and healthy  Past medical, surgical, family and social histories (including risk factors) reviewed, and no changes noted (except as noted below).  Past Medical History: Reviewed history from 03/04/2010 and no changes required. G2P0010 - 2x ectopic pregnancy (1997, 2011).  Past Surgical History: Reviewed history from 03/04/2010 and no changes required. DCIS, left breast, 2004.  s/p complete masectomy with reconstruction x3.  Has implant LEFT breast.  No radiation or chemotherapy. RIGHT salpingectomy 1997 secondary to ectopic pregnancy D&C 01/2008 for fibroids.  Family History: Reviewed history from 03/04/2010 and no changes required. Mother alive (b 26):  HTN, cervical cancer Father alive (b 73):  HTN, pulmonary HTN, DVTs, PEs Sister:  lung cancer Brother and other sister: alive and healthy  Social History: Reviewed history from 03/04/2010 and no changes required. Lives in [place] with boyfriend Sheresa Cullop (b [phone]).  Neither has children.  Has  a Rottweiler named [place].  Never smoked.  Rare EtOH, no illicit drugs.  Recently unemployed, former worked in Museum/gallery curator in Teaching laboratory technician.  Review of Systems       Pertinent positives and negatives noted in HPI, Vitals signs noted     Physical Exam  General:  Well nourished, pleasant aaf in nad Eyes:  pupils equal, pupils round, and pupils reactive to light.   Mouth:  Oropharynx non-erythematous, no drainage Neck:  supple, no masses, no carotid bruits, and no neck tenderness.   Lungs:  normal  respiratory effort, CTAB Heart:  normal rate, regular rhythm, no murmur, and no gallop.   Neurologic:  alert & oriented X3, cranial nerves II-XII intact, strength normal in all extremities, and sensation intact to light touch.     Impression & Recommendations:  Problem # 1:  ESSENTIAL HYPERTENSION, BENIGN (ICD-401.1) BP may be contributing to severity of headaches, started on metoprolol to control HTN and prevent migraines.  Will have her return to clinic in 1 month for recheck of BP Last CMET in 04/2010, WNL Her updated medication list for this problem includes:    Metoprolol Tartrate 25 Mg Tabs (Metoprolol tartrate) .Marland Kitchen... 1 tab by mouth two times a day for hypertension  Orders: [facility]- Est Level  3 ([address])  Problem # 2:  MIGRAINE HEADACHE (ICD-346.90) Patient advised on red flags of headaches and told to call if any arise. Given Imitrex for acute migraines and metoprolol for prevention Her updated medication list for this problem includes:    Metoprolol Tartrate 25 Mg Tabs (Metoprolol tartrate) .Marland Kitchen... 1 tab by mouth two times a day for hypertension    Imitrex 25 Mg Tabs (Sumatriptan succinate) .Marland Kitchen... 1 tab by mouth may repeat 1 tab if headache still present after 2 hours.  do not exceed 4 tabs in 24 hours  Orders: [facility]- Est Level  3 ([address])  Complete Medication List: 1)  High Potency Iron 86 (27 Fe) Mg Caps (Ferrous fumarate) .Marland Kitchen.. 1 tab by mouth two times a day for anemia 2)  Potassium Chloride Cr 10 Meq Cr-tabs (Potassium chloride) .Marland Kitchen.. 1 tab by mouth daily 3)  Metoprolol Tartrate 25 Mg Tabs (Metoprolol tartrate) .Marland Kitchen.. 1 tab by mouth two times a day for hypertension 4)  Imitrex 25 Mg Tabs (Sumatriptan succinate) .Marland Kitchen.. 1 tab by mouth may repeat 1 tab if headache still present after 2 hours.  do not exceed 4 tabs in 24 hours    Patient Instructions: 1)  Your prescriptions have been sent to the health department. 2)  We are starting you on a blood pressure medication called metoprolol,  this will help with you blood pressure and prevention of the headaches. 3)  For the pain of the headache I am giving you a medicine called imitrex.  Please take this when you have acute pain with the headache. 4)  Please contact the clinic if you have pain that is greater than normal, vision changes, numbness, or weakness.  Also contact the [place] if you have chest pain or palpitations or are feeling weak or dizzy. 5)  Please schedule a follow-up appointment in 1 month.   Prescriptions: IMITREX 25 MG TABS (SUMATRIPTAN SUCCINATE) 1 tab by mouth may repeat 1 tab if headache still present after 2 hours.  Do not exceed 4 tabs in 24 hours  #60 x 1   Entered by:   Everrett Coombe DO   Authorized by:   Jamie Brookes MD   Signed by:  Everrett Coombe DO on 05/01/2010   Method used:   Faxed to ...       [facility] (retail)       [address] [place], [place]  [address]       Ph: [phone]       Fax: [phone]   RxID:   [id number] METOPROLOL TARTRATE 25 MG TABS (METOPROLOL TARTRATE) 1 tab by mouth two times a day for hypertension  #60 x 1   Entered by:   Everrett Coombe DO   Authorized by:   Jamie Brookes MD   Signed by:   Everrett Coombe DO on 05/01/2010   Method used:   Faxed to ...       [facility] Department (retail)       [address] [place], [place]  [address]       Ph: [phone]       Fax: [phone]   RxID:   [id number]

## 2010-12-03 NOTE — Consult Note (Signed)
[facility]   Imported By: De Nurse 11/11/2010 14:44:00  _____________________________________________________________________  External Attachment:    Type:   Image     Comment:   External Document

## 2010-12-03 NOTE — Consult Note (Signed)
Summary: [facility]   Imported By: Bradly Bienenstock 11/10/2010 10:51:08  _____________________________________________________________________  External Attachment:    Type:   Image     Comment:   External Document

## 2010-12-03 NOTE — Assessment & Plan Note (Signed)
Summary: hands & feet swelling/eo   Vital Signs:  Patient profile:   40 year old female Height:      63 inches Weight:      153.56 pounds BMI:     27.30 BSA:     1.73 Temp:     98.3 degrees F Pulse rate:   64 / minute BP sitting:   130 / 98  Vitals Entered By: Jone Baseman CMA (November 11, 2010 10:58 AM) CC: hand and feet swelling x couple weeks Is Patient Diabetic? No Pain Assessment Patient in pain? yes     Location: hands/arms/shoulders Intensity: 7    Primary Care Provider:  Ellery Plunk MD  CC:  hand and feet swelling x couple weeks.  History of Present Illness: finished chemo in oct, finished radiation january 4th.   lymph node biopsy left not on right.  swelling in hands x3 weeks.  suddenly.  no injury, not different throughout day.  both hands swollen.  not happened before.  wants to go back to work but cant.  warm water helps.    occasional swelling in feet that comes and goes.  has migrating pain over body that comes and goes.  takes aleve, doesnt really help.  sees oncology tomorrow.  works out on stationary bike, felt dizzy yesterday during exercise.  Habits & Providers  Alcohol-Tobacco-Diet     Tobacco Status: never  Current Medications (verified): 1)  None  Allergies (verified): No Known Drug Allergies  Past History:  Past Medical History: G2P0010 - 2x ectopic pregnancy (1997, 2011). breast CA x2   Past Surgical History: recurrent invasive breast CA with lumpectomy chemo, radiation 2011 DCIS, left breast, 2004.  s/p complete masectomy with reconstruction x3.  Has implant LEFT breast.  No radiation or chemotherapy. RIGHT salpingectomy 1997 secondary to ectopic pregnancy D&C 01/2008 for fibroids.  Physical Exam  General:  well-developed, well-nourished, and well-hydrated.   Head:  normocephalic and atraumatic.   Lungs:  normal respiratory effort and normal breath sounds.   Heart:  normal rate, regular rhythm, and no murmur.     Abdomen:  soft and non-tender.   Extremities:  hands with some mild swelling over fingers.  full ROM, no joint swelling/tenderness  no one point of tenderness over body, vague areas of pain   Impression & Recommendations:  Problem # 1:  EDEMA (ICD-782.3) Assessment New unclear etiology, seems likely to be related to CA treatments but oncologist does not agree.  will check labs today to see if something is abnormal and would lead [place] one direction.    RTC in 2 weeks.  consider med for post chemo pains like nortryptilline. continue aleve.  No sign that pains are related to any area of metatasis Orders: Urinalysis-[facility] (00000) U Preg-[facility] (810[address]) CBC-[facility] ([address]) CK (Creatine Kinase)-[facility] [phone]) Comp Met-[facility] ([address]1478-29562) TSH-[facility] (13086-57846) [facility]- Est  Level 4 ([address])  Problem # 2:  ESSENTIAL HYPERTENSION, BENIGN (ICD-401.1) Assessment: Unchanged  metoprolol made her feel bad and she stopped it.  recheck bp at next visit.  checking labs today The following medications were removed from the medication list:    Metoprolol Tartrate 25 Mg Tabs (Metoprolol tartrate) .Marland Kitchen... 1 tab by mouth two times a day for hypertension  Orders: [facility]- Est  Level 4 ([address])  Patient Instructions: 1)  It was nice to see you today 2)  We are checking some blood work for your swelling 3)  Take aleve or tylenol for pain 4)  come back in 2 weeks to  recheck blood pressure and swelling. 5)  I will call with your lab results   Orders Added: 1)  Urinalysis-[facility] [00000] 2)  U Preg-[facility] [81025] 3)  CBC-[facility] [85027] 4)  CK (Creatine Kinase)-[facility] [82550-23250] 5)  Comp Met-[facility] [80053-22900] 6)  TSH-[facility] [[address]] 7)  [facility]- Est  Level 4 [[address]]  Appended Document: u preg = negative    Lab Visit  Laboratory Results   Urine Tests  Date/Time Received: November 11, 2010 12:11 PM  Date/Time Reported: November 11, 2010 12:27 PM November 11, 2010 12:32 PM   Routine Urinalysis   Color:  yellow Appearance: Clear Glucose: negative   (Normal Range: Negative) Bilirubin: negative   (Normal Range: Negative) Ketone: negative   (Normal Range: Negative) Spec. Gravity: 1.025   (Normal Range: 1.003-1.035) Blood: negative   (Normal Range: Negative) pH: 5.5   (Normal Range: 5.0-8.0) Protein: negative   (Normal Range: Negative) Urobilinogen: 0.2   (Normal Range: 0-1) Nitrite: negative   (Normal Range: Negative) Leukocyte Esterace: negative   (Normal Range: Negative)    Urine HCG: negative Comments: ...............test performed by......Marland KitchenBonnie A. [place], MLS (ASCP)cm    Orders Today:

## 2010-12-03 NOTE — Letter (Signed)
Summary: [facility]: DM supplies order  [facility]: DM supplies order   Imported By: Knox Royalty 11/26/2010 10:38:32  _____________________________________________________________________  External Attachment:    Type:   Image     Comment:   External Document

## 2010-12-03 NOTE — Consult Note (Signed)
Summary: [facility]  [facility]   Imported By: Knox Royalty 11/26/2010 10:26:56  _____________________________________________________________________  External Attachment:    Type:   Image     Comment:   External Document  Appended Document: [facility]    Clinical Lists Changes  Medications: Added new medication of TAMOXIFEN CITRATE 20 MG TABS (TAMOXIFEN CITRATE)

## 2010-12-09 ENCOUNTER — Ambulatory Visit: Payer: Self-pay | Attending: Radiation Oncology | Admitting: Radiation Oncology

## 2010-12-17 ENCOUNTER — Other Ambulatory Visit (HOSPITAL_COMMUNITY): Payer: Self-pay | Admitting: Oncology

## 2010-12-17 ENCOUNTER — Encounter (HOSPITAL_BASED_OUTPATIENT_CLINIC_OR_DEPARTMENT_OTHER): Payer: Self-pay | Admitting: Oncology

## 2010-12-17 DIAGNOSIS — Z5111 Encounter for antineoplastic chemotherapy: Secondary | ICD-10-CM

## 2010-12-17 DIAGNOSIS — M7989 Other specified soft tissue disorders: Secondary | ICD-10-CM

## 2010-12-17 DIAGNOSIS — C50419 Malignant neoplasm of upper-outer quadrant of unspecified female breast: Secondary | ICD-10-CM

## 2010-12-17 DIAGNOSIS — IMO0001 Reserved for inherently not codable concepts without codable children: Secondary | ICD-10-CM

## 2010-12-17 DIAGNOSIS — Z452 Encounter for adjustment and management of vascular access device: Secondary | ICD-10-CM

## 2010-12-17 LAB — CBC WITH DIFFERENTIAL/PLATELET
BASO%: 0.5 % (ref 0.0–2.0)
Basophils Absolute: 0 10*3/uL (ref 0.0–0.1)
EOS%: 1.4 % (ref 0.0–7.0)
Eosinophils Absolute: 0.1 10*3/uL (ref 0.0–0.5)
HCT: 37.3 % (ref 34.8–46.6)
HGB: 12.9 g/dL (ref 11.6–15.9)
LYMPH%: 30.9 % (ref 14.0–49.7)
MCH: 29.7 pg (ref 25.1–34.0)
MCHC: 34.6 g/dL (ref 31.5–36.0)
MCV: 85.9 fL (ref 79.5–101.0)
MONO#: 0.2 10*3/uL (ref 0.1–0.9)
MONO%: 4.2 % (ref 0.0–14.0)
NEUT#: 2.7 10*3/uL (ref 1.5–6.5)
NEUT%: 63 % (ref 38.4–76.8)
Platelets: 231 10*3/uL (ref 145–400)
RBC: 4.34 10*6/uL (ref 3.70–5.45)
RDW: 13.6 % (ref 11.2–14.5)
WBC: 4.3 10*3/uL (ref 3.9–10.3)
lymph#: 1.3 10*3/uL (ref 0.9–3.3)

## 2010-12-17 LAB — COMPREHENSIVE METABOLIC PANEL
ALT: 11 U/L (ref 0–35)
AST: 20 U/L (ref 0–37)
Albumin: 4.2 g/dL (ref 3.5–5.2)
Alkaline Phosphatase: 78 U/L (ref 39–117)
BUN: 12 mg/dL (ref 6–23)
CO2: 23 mEq/L (ref 19–32)
Calcium: 9.3 mg/dL (ref 8.4–10.5)
Chloride: 103 mEq/L (ref 96–112)
Creatinine, Ser: 0.65 mg/dL (ref 0.40–1.20)
Glucose, Bld: 110 mg/dL — ABNORMAL HIGH (ref 70–99)
Potassium: 3.6 mEq/L (ref 3.5–5.3)
Sodium: 139 mEq/L (ref 135–145)
Total Bilirubin: 0.5 mg/dL (ref 0.3–1.2)
Total Protein: 7.6 g/dL (ref 6.0–8.3)

## 2010-12-17 LAB — LACTATE DEHYDROGENASE: LDH: 189 U/L (ref 94–250)

## 2010-12-18 ENCOUNTER — Encounter: Payer: Self-pay | Admitting: *Deleted

## 2011-01-13 ENCOUNTER — Ambulatory Visit (INDEPENDENT_AMBULATORY_CARE_PROVIDER_SITE_OTHER): Payer: Self-pay | Admitting: *Deleted

## 2011-01-13 DIAGNOSIS — Z111 Encounter for screening for respiratory tuberculosis: Secondary | ICD-10-CM

## 2011-01-13 MED ORDER — TUBERCULIN PPD 5 UNIT/0.1ML ID SOLN: 5.0000 [IU] | Freq: Once | INTRADERMAL | Status: DC

## 2011-01-14 ENCOUNTER — Encounter (HOSPITAL_BASED_OUTPATIENT_CLINIC_OR_DEPARTMENT_OTHER): Payer: Self-pay | Admitting: Oncology

## 2011-01-14 ENCOUNTER — Other Ambulatory Visit (HOSPITAL_COMMUNITY): Payer: Self-pay | Admitting: Oncology

## 2011-01-14 DIAGNOSIS — M7989 Other specified soft tissue disorders: Secondary | ICD-10-CM

## 2011-01-14 DIAGNOSIS — C50419 Malignant neoplasm of upper-outer quadrant of unspecified female breast: Secondary | ICD-10-CM

## 2011-01-14 DIAGNOSIS — IMO0001 Reserved for inherently not codable concepts without codable children: Secondary | ICD-10-CM

## 2011-01-14 LAB — COMPREHENSIVE METABOLIC PANEL
ALT: 13 U/L (ref 0–35)
AST: 24 U/L (ref 0–37)
Albumin: 3.8 g/dL (ref 3.5–5.2)
Alkaline Phosphatase: 81 U/L (ref 39–117)
BUN: 11 mg/dL (ref 6–23)
CO2: 29 mEq/L (ref 19–32)
Calcium: 9.7 mg/dL (ref 8.4–10.5)
Chloride: 108 mEq/L (ref 96–112)
Creatinine, Ser: 0.8 mg/dL (ref 0.40–1.20)
Glucose, Bld: 100 mg/dL — ABNORMAL HIGH (ref 70–99)
Potassium: 3.7 mEq/L (ref 3.5–5.3)
Sodium: 142 mEq/L (ref 135–145)
Total Bilirubin: 0.6 mg/dL (ref 0.3–1.2)
Total Protein: 7.8 g/dL (ref 6.0–8.3)

## 2011-01-14 LAB — CBC WITH DIFFERENTIAL/PLATELET
BASO%: 0.6 % (ref 0.0–2.0)
Basophils Absolute: 0 10*3/uL (ref 0.0–0.1)
EOS%: 1.3 % (ref 0.0–7.0)
Eosinophils Absolute: 0.1 10*3/uL (ref 0.0–0.5)
HCT: 37.4 % (ref 34.8–46.6)
HGB: 12.7 g/dL (ref 11.6–15.9)
LYMPH%: 28.9 % (ref 14.0–49.7)
MCH: 29.3 pg (ref 25.1–34.0)
MCHC: 34 g/dL (ref 31.5–36.0)
MCV: 86.4 fL (ref 79.5–101.0)
MONO#: 0.3 10*3/uL (ref 0.1–0.9)
MONO%: 5.7 % (ref 0.0–14.0)
NEUT#: 3.4 10*3/uL (ref 1.5–6.5)
NEUT%: 63.5 % (ref 38.4–76.8)
Platelets: 243 10*3/uL (ref 145–400)
RBC: 4.33 10*6/uL (ref 3.70–5.45)
RDW: 13.9 % (ref 11.2–14.5)
WBC: 5.3 10*3/uL (ref 3.9–10.3)
lymph#: 1.5 10*3/uL (ref 0.9–3.3)

## 2011-01-14 LAB — LACTATE DEHYDROGENASE: LDH: 175 U/L (ref 94–250)

## 2011-01-14 LAB — SEDIMENTATION RATE: Sed Rate: 14 mm/hr (ref 0–22)

## 2011-01-15 ENCOUNTER — Ambulatory Visit (INDEPENDENT_AMBULATORY_CARE_PROVIDER_SITE_OTHER): Payer: Self-pay | Admitting: *Deleted

## 2011-01-15 DIAGNOSIS — Z111 Encounter for screening for respiratory tuberculosis: Secondary | ICD-10-CM

## 2011-01-15 LAB — CK: Total CK: 254 U/L — ABNORMAL HIGH (ref 7–177)

## 2011-01-15 LAB — ANA: Anti Nuclear Antibody(ANA): NEGATIVE

## 2011-01-15 LAB — RHEUMATOID FACTOR: Rhuematoid fact SerPl-aCnc: <10 IU/mL (ref <=14)

## 2011-01-15 NOTE — Progress Notes (Signed)
PPD results negative "0mm".

## 2011-01-16 LAB — CBC
HCT: 33.5 % — ABNORMAL LOW (ref 36.0–46.0)
Hemoglobin: 11 g/dL — ABNORMAL LOW (ref 12.0–15.0)
MCH: 32.2 pg (ref 26.0–34.0)
MCHC: 33 g/dL (ref 30.0–36.0)
MCV: 97.8 fL (ref 78.0–100.0)
Platelets: 266 10*3/uL (ref 150–400)
RBC: 3.42 MIL/uL — ABNORMAL LOW (ref 3.87–5.11)
RDW: 16.7 % — ABNORMAL HIGH (ref 11.5–15.5)
WBC: 5.6 10*3/uL (ref 4.0–10.5)

## 2011-01-16 LAB — URINALYSIS, ROUTINE W REFLEX MICROSCOPIC
Bilirubin Urine: NEGATIVE
Glucose, UA: NEGATIVE mg/dL
Ketones, ur: NEGATIVE mg/dL
Leukocytes, UA: NEGATIVE
Nitrite: NEGATIVE
Protein, ur: NEGATIVE mg/dL
Specific Gravity, Urine: 1.02 (ref 1.005–1.030)
Urobilinogen, UA: 0.2 mg/dL (ref 0.0–1.0)
pH: 6 (ref 5.0–8.0)

## 2011-01-16 LAB — DIFFERENTIAL
Basophils Absolute: 0 10*3/uL (ref 0.0–0.1)
Basophils Relative: 1 % (ref 0–1)
Eosinophils Absolute: 0 10*3/uL (ref 0.0–0.7)
Eosinophils Relative: 1 % (ref 0–5)
Lymphocytes Relative: 28 % (ref 12–46)
Lymphs Abs: 1.6 10*3/uL (ref 0.7–4.0)
Monocytes Absolute: 0.4 10*3/uL (ref 0.1–1.0)
Monocytes Relative: 6 % (ref 3–12)
Neutro Abs: 3.6 10*3/uL (ref 1.7–7.7)
Neutrophils Relative %: 64 % (ref 43–77)

## 2011-01-16 LAB — URINE MICROSCOPIC-ADD ON

## 2011-01-16 LAB — LACTATE DEHYDROGENASE: LDH: 177 U/L (ref 94–250)

## 2011-01-16 LAB — COMPREHENSIVE METABOLIC PANEL
ALT: 13 U/L (ref 0–35)
AST: 23 U/L (ref 0–37)
Albumin: 3.6 g/dL (ref 3.5–5.2)
Alkaline Phosphatase: 62 U/L (ref 39–117)
BUN: 6 mg/dL (ref 6–23)
CO2: 24 mEq/L (ref 19–32)
Calcium: 9.1 mg/dL (ref 8.4–10.5)
Chloride: 111 mEq/L (ref 96–112)
Creatinine, Ser: 0.7 mg/dL (ref 0.4–1.2)
GFR calc Af Amer: >60 mL/min (ref >60)
GFR calc non Af Amer: >60 mL/min (ref >60)
Glucose, Bld: 88 mg/dL (ref 70–99)
Potassium: 3.7 mEq/L (ref 3.5–5.1)
Sodium: 141 mEq/L (ref 135–145)
Total Bilirubin: 0.3 mg/dL (ref 0.3–1.2)
Total Protein: 7.4 g/dL (ref 6.0–8.3)

## 2011-01-16 LAB — SURGICAL PCR SCREEN
MRSA, PCR: NEGATIVE
Staphylococcus aureus: NEGATIVE

## 2011-01-16 LAB — PREGNANCY, URINE: Preg Test, Ur: NEGATIVE

## 2011-01-16 LAB — CANCER ANTIGEN 27.29: CA 27.29: 10 U/mL (ref 0–39)

## 2011-01-17 LAB — GLUCOSE, CAPILLARY: Glucose-Capillary: 99 mg/dL (ref 70–99)

## 2011-01-19 LAB — URINALYSIS, ROUTINE W REFLEX MICROSCOPIC
Bilirubin Urine: NEGATIVE
Glucose, UA: NEGATIVE mg/dL
Ketones, ur: NEGATIVE mg/dL
Leukocytes, UA: NEGATIVE
Nitrite: NEGATIVE
Protein, ur: NEGATIVE mg/dL
Specific Gravity, Urine: >1.03 — ABNORMAL HIGH (ref 1.005–1.030)
Urobilinogen, UA: 0.2 mg/dL (ref 0.0–1.0)
pH: 5.5 (ref 5.0–8.0)

## 2011-01-19 LAB — CBC
HCT: 29.8 % — ABNORMAL LOW (ref 36.0–46.0)
HCT: 31.7 % — ABNORMAL LOW (ref 36.0–46.0)
Hemoglobin: 10 g/dL — ABNORMAL LOW (ref 12.0–15.0)
Hemoglobin: 10.7 g/dL — ABNORMAL LOW (ref 12.0–15.0)
MCHC: 33.6 g/dL (ref 30.0–36.0)
MCHC: 33.8 g/dL (ref 30.0–36.0)
MCV: 93 fL (ref 78.0–100.0)
MCV: 94.8 fL (ref 78.0–100.0)
Platelets: 231 10*3/uL (ref 150–400)
Platelets: 252 10*3/uL (ref 150–400)
RBC: 3.2 MIL/uL — ABNORMAL LOW (ref 3.87–5.11)
RBC: 3.34 MIL/uL — ABNORMAL LOW (ref 3.87–5.11)
RDW: 14.7 % (ref 11.5–15.5)
RDW: 15.4 % (ref 11.5–15.5)
WBC: 5.4 10*3/uL (ref 4.0–10.5)
WBC: 6.2 10*3/uL (ref 4.0–10.5)

## 2011-01-19 LAB — GC/CHLAMYDIA PROBE AMP, GENITAL
Chlamydia, DNA Probe: NEGATIVE
GC Probe Amp, Genital: NEGATIVE

## 2011-01-19 LAB — DIFFERENTIAL
Basophils Absolute: 0 10*3/uL (ref 0.0–0.1)
Basophils Relative: 1 % (ref 0–1)
Eosinophils Absolute: 0.1 10*3/uL (ref 0.0–0.7)
Eosinophils Relative: 2 % (ref 0–5)
Lymphocytes Relative: 27 % (ref 12–46)
Lymphs Abs: 1.5 10*3/uL (ref 0.7–4.0)
Monocytes Absolute: 0.2 10*3/uL (ref 0.1–1.0)
Monocytes Relative: 4 % (ref 3–12)
Neutro Abs: 3.6 10*3/uL (ref 1.7–7.7)
Neutrophils Relative %: 67 % (ref 43–77)

## 2011-01-19 LAB — URINE MICROSCOPIC-ADD ON

## 2011-01-19 LAB — HCG, QUANTITATIVE, PREGNANCY
hCG, Beta Chain, Quant, S: 139 m[IU]/mL — ABNORMAL HIGH (ref <5)
hCG, Beta Chain, Quant, S: 175 m[IU]/mL — ABNORMAL HIGH (ref <5)
hCG, Beta Chain, Quant, S: 52 m[IU]/mL — ABNORMAL HIGH (ref <5)
hCG, Beta Chain, Quant, S: 111 m[IU]/mL — ABNORMAL HIGH (ref <5)
hCG, Beta Chain, Quant, S: 155 m[IU]/mL — ABNORMAL HIGH (ref <5)
hCG, Beta Chain, Quant, S: 205 m[IU]/mL — ABNORMAL HIGH (ref <5)
hCG, Beta Chain, Quant, S: 213 m[IU]/mL — ABNORMAL HIGH (ref <5)
hCG, Beta Chain, Quant, S: 66 m[IU]/mL — ABNORMAL HIGH (ref <5)
hCG, Beta Chain, Quant, S: 79 m[IU]/mL — ABNORMAL HIGH (ref <5)

## 2011-01-19 LAB — CREATININE, SERUM
Creatinine, Ser: 0.67 mg/dL (ref 0.4–1.2)
Creatinine, Ser: 0.75 mg/dL (ref 0.4–1.2)
GFR calc Af Amer: >60 mL/min (ref >60)
GFR calc Af Amer: >60 mL/min (ref >60)
GFR calc non Af Amer: >60 mL/min (ref >60)
GFR calc non Af Amer: >60 mL/min (ref >60)

## 2011-01-19 LAB — AST
AST: 19 U/L (ref 0–37)
AST: 25 U/L (ref 0–37)

## 2011-01-19 LAB — RH IMMUNE GLOBULIN WORKUP (NOT WOMEN'S HOSP)
ABO/RH(D): O NEG
Antibody Screen: NEGATIVE

## 2011-01-19 LAB — WET PREP, GENITAL
Clue Cells Wet Prep HPF POC: NONE SEEN
Trich, Wet Prep: NONE SEEN
Yeast Wet Prep HPF POC: NONE SEEN

## 2011-01-19 LAB — POCT PREGNANCY, URINE: Preg Test, Ur: POSITIVE

## 2011-01-19 LAB — BUN
BUN: 6 mg/dL (ref 6–23)
BUN: 9 mg/dL (ref 6–23)

## 2011-01-19 LAB — ABO/RH: ABO/RH(D): O NEG

## 2011-01-24 LAB — POCT I-STAT, CHEM 8
BUN: 13 mg/dL (ref 6–23)
Calcium, Ion: 1.2 mmol/L (ref 1.12–1.32)
Chloride: 109 mEq/L (ref 96–112)
Creatinine, Ser: 0.7 mg/dL (ref 0.4–1.2)
Glucose, Bld: 99 mg/dL (ref 70–99)
HCT: 38 % (ref 36.0–46.0)
Hemoglobin: 12.9 g/dL (ref 12.0–15.0)
Potassium: 3.8 mEq/L (ref 3.5–5.1)
Sodium: 141 mEq/L (ref 135–145)
TCO2: 23 mmol/L (ref 0–100)

## 2011-01-24 LAB — POCT URINALYSIS DIP (DEVICE)
Bilirubin Urine: NEGATIVE
Glucose, UA: NEGATIVE mg/dL
Ketones, ur: NEGATIVE mg/dL
Nitrite: POSITIVE — AB
Protein, ur: NEGATIVE mg/dL
Specific Gravity, Urine: >=1.03 (ref 1.005–1.030)
Urobilinogen, UA: 0.2 mg/dL (ref 0.0–1.0)
pH: 5 (ref 5.0–8.0)

## 2011-01-24 LAB — URINE CULTURE: Colony Count: >=100000

## 2011-01-24 LAB — POCT PREGNANCY, URINE: Preg Test, Ur: NEGATIVE

## 2011-01-28 ENCOUNTER — Ambulatory Visit (INDEPENDENT_AMBULATORY_CARE_PROVIDER_SITE_OTHER): Payer: Self-pay | Admitting: Family Medicine

## 2011-01-28 ENCOUNTER — Encounter: Payer: Self-pay | Admitting: Family Medicine

## 2011-01-28 ENCOUNTER — Other Ambulatory Visit (HOSPITAL_COMMUNITY)
Admission: RE | Admit: 2011-01-28 | Discharge: 2011-01-28 | Disposition: A | Discharge status: Home / Self Care | Payer: Self-pay | Source: Ambulatory Visit | Attending: Family Medicine | Admitting: Family Medicine

## 2011-01-28 ENCOUNTER — Encounter (HOSPITAL_BASED_OUTPATIENT_CLINIC_OR_DEPARTMENT_OTHER): Payer: Self-pay | Admitting: Oncology

## 2011-01-28 VITALS — BP 147/88 | HR 72 | Temp 98.3°F | Wt 152.1 lb

## 2011-01-28 DIAGNOSIS — C50419 Malignant neoplasm of upper-outer quadrant of unspecified female breast: Secondary | ICD-10-CM

## 2011-01-28 DIAGNOSIS — G589 Mononeuropathy, unspecified: Secondary | ICD-10-CM

## 2011-01-28 DIAGNOSIS — M79643 Pain in unspecified hand: Secondary | ICD-10-CM | POA: Insufficient documentation

## 2011-01-28 DIAGNOSIS — Z124 Encounter for screening for malignant neoplasm of cervix: Secondary | ICD-10-CM

## 2011-01-28 DIAGNOSIS — IMO0001 Reserved for inherently not codable concepts without codable children: Secondary | ICD-10-CM

## 2011-01-28 DIAGNOSIS — Z01419 Encounter for gynecological examination (general) (routine) without abnormal findings: Secondary | ICD-10-CM | POA: Insufficient documentation

## 2011-01-28 DIAGNOSIS — G629 Polyneuropathy, unspecified: Secondary | ICD-10-CM | POA: Insufficient documentation

## 2011-01-28 DIAGNOSIS — N760 Acute vaginitis: Secondary | ICD-10-CM

## 2011-01-28 DIAGNOSIS — I10 Essential (primary) hypertension: Secondary | ICD-10-CM

## 2011-01-28 DIAGNOSIS — M7989 Other specified soft tissue disorders: Secondary | ICD-10-CM

## 2011-01-28 DIAGNOSIS — M79609 Pain in unspecified limb: Secondary | ICD-10-CM

## 2011-01-28 LAB — POCT WET PREP (WET MOUNT)
Trichomonas Wet Prep HPF POC: NEGATIVE
Yeast Wet Prep HPF POC: NEGATIVE

## 2011-01-28 MED ORDER — HYDROCHLOROTHIAZIDE 12.5 MG PO CAPS: 12.5000 mg | ORAL_CAPSULE | Freq: Every day | ORAL | Status: DC

## 2011-01-28 MED ORDER — GABAPENTIN 300 MG PO CAPS: 300.0000 mg | ORAL_CAPSULE | ORAL | Status: DC

## 2011-01-28 NOTE — Assessment & Plan Note (Signed)
Seems to be related to post chemo effects.  Will try 300mg  qhs and see if that helps

## 2011-01-28 NOTE — Progress Notes (Signed)
  Subjective:    Patient ID: Victoria Delacruz, female    DOB: [birth year], 40 y.o.   MRN: [id number]  HPI  Pt returns for [name]-  Last PAP one year ago.  On tamoxifen for breast CA and effexor for hot flashes.  Notes that her hand pain is not better, has pain with movt, bending fingers, cant write without pain Notes a yellow d/c for the last week.  Has a concern that she should be checked for STDs periodically.    Review of Systems Denies HA, CP, SOB    Objective:   Physical Exam  Genitourinary: Vagina normal and uterus normal. There is no rash on the right labia. There is no rash on the left labia. Cervix exhibits no motion tenderness, no discharge and no friability. Right adnexum displays no mass. Left adnexum displays no mass.   Vital signs reviewed General appearance - alert, well appearing, and in no distress and oriented to person, place, and time Chest - clear to auscultation, no wheezes, rales or rhonchi, symmetric air entry, no tachypnea, retractions or cyanosis Heart - normal rate, regular rhythm, normal S1, S2, no murmurs, rubs, clicks or gallops Abdomen - soft, nontender, nondistended, no masses or organomegaly        Assessment & Plan:

## 2011-01-28 NOTE — Assessment & Plan Note (Signed)
Unclear etiology of this, oncologist states not related to chemo, but that seems most likely to me.  Oncologist wanted to start prednisone today but pt and I decided to wait to try that.  Will try HCTZ and gabapentin at night (mostly for neuropathy in feet) as well as motrin BID for a few weeks.  Will see back in 1 month.  Will try to get pt in to rheum but right now with negative markers, not sure what else I should try.

## 2011-01-28 NOTE — Assessment & Plan Note (Signed)
BP elevated at several visits.  Will start HCTZ today.  RTC 1 month, check BMET, f/u BP.

## 2011-01-28 NOTE — Patient Instructions (Signed)
Please start the HCTZ and the gabapentin Take motrin twice a day I will put in a referral for a rheumatologist to get you on a list if possible If anything come back on your swab, I will give you a call Come back to see me in one month. We will check blood work, check for STDs and talk about your hands

## 2011-01-29 ENCOUNTER — Other Ambulatory Visit: Payer: Self-pay | Admitting: Family Medicine

## 2011-01-29 DIAGNOSIS — M79643 Pain in unspecified hand: Secondary | ICD-10-CM

## 2011-01-29 MED ORDER — GABAPENTIN 300 MG PO CAPS: 300.0000 mg | ORAL_CAPSULE | Freq: Every day | ORAL | Status: DC

## 2011-02-03 ENCOUNTER — Encounter: Payer: Self-pay | Admitting: Family Medicine

## 2011-03-02 ENCOUNTER — Encounter (HOSPITAL_BASED_OUTPATIENT_CLINIC_OR_DEPARTMENT_OTHER): Payer: Self-pay | Admitting: Oncology

## 2011-03-02 ENCOUNTER — Other Ambulatory Visit (HOSPITAL_COMMUNITY): Payer: Self-pay | Admitting: Oncology

## 2011-03-02 DIAGNOSIS — M7989 Other specified soft tissue disorders: Secondary | ICD-10-CM

## 2011-03-02 DIAGNOSIS — IMO0001 Reserved for inherently not codable concepts without codable children: Secondary | ICD-10-CM

## 2011-03-02 DIAGNOSIS — C50419 Malignant neoplasm of upper-outer quadrant of unspecified female breast: Secondary | ICD-10-CM

## 2011-03-02 LAB — COMPREHENSIVE METABOLIC PANEL WITH GFR
ALT: 11 U/L (ref 0–35)
AST: 23 U/L (ref 0–37)
Albumin: 4.4 g/dL (ref 3.5–5.2)
Alkaline Phosphatase: 80 U/L (ref 39–117)
BUN: 10 mg/dL (ref 6–23)
CO2: 25 meq/L (ref 19–32)
Calcium: 9.6 mg/dL (ref 8.4–10.5)
Chloride: 102 meq/L (ref 96–112)
Creatinine, Ser: 0.81 mg/dL (ref 0.40–1.20)
Glucose, Bld: 88 mg/dL (ref 70–99)
Potassium: 3.7 meq/L (ref 3.5–5.3)
Sodium: 138 meq/L (ref 135–145)
Total Bilirubin: 0.6 mg/dL (ref 0.3–1.2)
Total Protein: 7.8 g/dL (ref 6.0–8.3)

## 2011-03-02 LAB — LACTATE DEHYDROGENASE: LDH: 171 U/L (ref 94–250)

## 2011-03-02 LAB — CBC WITH DIFFERENTIAL/PLATELET
BASO%: 0.2 % (ref 0.0–2.0)
Basophils Absolute: 0 10*3/uL (ref 0.0–0.1)
EOS%: 1.1 % (ref 0.0–7.0)
Eosinophils Absolute: 0.1 10*3/uL (ref 0.0–0.5)
HCT: 38.1 % (ref 34.8–46.6)
HGB: 13.1 g/dL (ref 11.6–15.9)
LYMPH%: 25.5 % (ref 14.0–49.7)
MCH: 31.5 pg (ref 25.1–34.0)
MCHC: 34.3 g/dL (ref 31.5–36.0)
MCV: 91.7 fL (ref 79.5–101.0)
MONO#: 0.3 10*3/uL (ref 0.1–0.9)
MONO%: 5 % (ref 0.0–14.0)
NEUT#: 3.6 10*3/uL (ref 1.5–6.5)
NEUT%: 68.2 % (ref 38.4–76.8)
Platelets: 237 10*3/uL (ref 145–400)
RBC: 4.16 10*6/uL (ref 3.70–5.45)
RDW: 14 % (ref 11.2–14.5)
WBC: 5.3 10*3/uL (ref 3.9–10.3)
lymph#: 1.3 10*3/uL (ref 0.9–3.3)

## 2011-03-12 ENCOUNTER — Ambulatory Visit: Payer: Self-pay | Admitting: Family Medicine

## 2011-03-12 ENCOUNTER — Encounter: Payer: Self-pay | Admitting: Family Medicine

## 2011-03-12 ENCOUNTER — Ambulatory Visit (INDEPENDENT_AMBULATORY_CARE_PROVIDER_SITE_OTHER): Payer: Self-pay | Admitting: Family Medicine

## 2011-03-12 DIAGNOSIS — G589 Mononeuropathy, unspecified: Secondary | ICD-10-CM

## 2011-03-12 DIAGNOSIS — G629 Polyneuropathy, unspecified: Secondary | ICD-10-CM

## 2011-03-14 NOTE — Progress Notes (Signed)
  Subjective:    Patient ID: Victoria Delacruz, female    DOB: Sep 21, 1971, 40 y.o.   MRN: [id number]  HPI Pt ocntinues to have aching, pain, and swelling in hands and feet. Also with pain in arms and legs.  Slightly better on the gabapentin.  Has seen oncologist and rheumatologist with possible diagnosis of fibromyalgia.     Review of Systems    denies fever, chills, CP, SOB  Objective:   Physical Exam    Vital signs reviewed General appearance - alert, well appearing, and in no distress and oriented to person, place, and time Heart - normal rate, regular rhythm, normal S1, S2, no murmurs, rubs, clicks or gallops Chest - clear to auscultation, no wheezes, rales or rhonchi, symmetric air entry, no tachypnea, retractions or cyanosis MSK- some "puffiness" of hands.  No loss of ROM or strength of upper or lower ext.  No rash.    Assessment & Plan:

## 2011-03-14 NOTE — Assessment & Plan Note (Signed)
Discussed fibromyalgia with pt.  Advised walking or other gentle exercise daily.  Increase gabapentin to BID.  Could consider switch to cymbalta from effexor in the future (which her oncologist also suggested)

## 2011-03-16 NOTE — Op Note (Signed)
[name], Victoria Delacruz NO.:  [id number]   MEDICAL RECORD NO.:  [id number]          PATIENT TYPE:  AMB   LOCATION:  SDC                           FACILITY:  [facility]   PHYSICIAN:  Maxie Better, M.D.DATE OF BIRTH:  Oct 07, 1971   DATE OF PROCEDURE:  02/01/2008  DATE OF DISCHARGE:                               OPERATIVE REPORT   PREOPERATIVE DIAGNOSIS:  Menorrhagia and submucosal fibroid.   PROCEDURE:  Diagnostic hysteroscopy, hysteroscopic resection of  submucosal fibroid, dilation and curettage.   POSTOPERATIVE DIAGNOSIS:  Menorrhagia and submucosal fibroid.   ANESTHESIA:  General, paracervical block.   SURGEON:  Maxie Better, M.D.   INDICATIONS:  This is a 40 year old G0 single black female with  symptomatic uterine fibroids resulting in severe iron deficiency anemia  who now presents for surgical management.  Surgical risks were reviewed  with the patient.  Consent was signed. The patient had a diagnosis  bacterial vaginosis and has already started antibiotics and continues on  antibiotics to cover her in the procedure.   PROCEDURE IN DETAIL:  Under adequate general anesthesia, the patient was  placed in the dorsal lithotomy position.  She was sterilely prepped and  draped in the usual fashion.  Her bladder was catheterized for a small  amount of urine.  Examination under anesthesia revealed an anteverted  uterus.  No adnexal masses could be appreciated.  A bivalve speculum was  placed in the vagina.  The anterior lip of the cervix was grasped with a  single tooth tenaculum.  10 mL of 1% Nesacaine was injected at 3 and 9  o'clock paracervically.  The cervix was easily dilated up to number 27  Pratt dilator and the resectoscope was introduced into the uterine  cavity.  The left tubal ostia could be seen but the cavity was not  maintaining its distention due to the dilation of the cervix.  Therefore, the small resectoscope was removed.  The cervix was  easily  accepted a #31 Pratt dilator and a larger resectoscope was introduced  into the uterine cavity without incident.  The anterior submucosal  fibroid was noted. Resection of the fibroid was then performed, the  cavity curetted, and the pieces removed.  When the resection was felt to  be adequate, all instruments were then removed from the vagina.  A  specimen labeled fibroid resection and endometrial curettings were sent  to pathology.  Estimated blood loss was minimal. Fluid deficit was 260  mL.  Complication was none.  The patient tolerated the procedure well  and was transferred to the recovery room in stable condition.      Maxie Better, M.D.  Electronically Signed     [place]/MED[facility]  D:  02/01/2008  T:  02/01/2008  Job:  [id number]

## 2011-03-19 NOTE — Op Note (Signed)
NAME:  Victoria Delacruz, Victoria Delacruz                        ACCOUNT NO.:  [id number]   MEDICAL RECORD NO.:  [id number]                   PATIENT TYPE:  INP   LOCATION:  2550                                 FACILITY:  [facility]   PHYSICIAN:  Yaakov Guthrie. [name], M.D.           DATE OF BIRTH:  01/11/1971   DATE OF PROCEDURE:  08/05/2003  DATE OF DISCHARGE:                                 OPERATIVE REPORT   PREOPERATIVE DIAGNOSIS:  Biopsy proven left breast cancer.   POSTOPERATIVE DIAGNOSIS:  Biopsy proven left breast cancer.   INDICATIONS FOR PROCEDURE:  The patient is now being prepared for  reconstruction of the area.  The alternatives and risks were explained to  the patient preoperatively and the patient chooses to have reconstruction  with tissue expander.  She also has some ptosis in the right breast and we  will do a superior lift on that side, mastopexy.   SURGEON:  Yaakov Guthrie. [name], M.D.   ANESTHESIA:  General.   DESCRIPTION OF PROCEDURE:  After Dr. Francina Ames had finished the left  mastectomy, prep was done to the breast and chest areas in routine fashion  using Betadine soap and solution and walled off with sterile towels so as to  make a sterile field.  The space was examined showing the demarcation of the  pectoralis major and serratus anterior.  A flap was made between the two and  dissection was carried subpectorally, medially, superiorly, laterally and  inferiorly using a lighter retractor as a guide and using a Bovie on  coagulation.  After proper hemostasis the space was then reconstructed with  a Mentor Seal-Tek tissue expander.  The specifications are the following:  Mentor reference [phone] and we filled it up to 225 mL to allow to sit into  the space.  The port was then taken to the left axillary area.  The muscles  were repaired with 2-0 Monocryl times two layers  and a subcutaneous suture  of 2-0 Monocryl and then the skin edges reapproximated with running  subcuticular stitch of 3-0 Monocryl.  The wounds were drained with a #10  Blake drain, fully floated and placed in the depths of the wound and brought  out through the lateral most portion of the incision and secured with 3-0  nylon.  Steri-Strips and soft dressings were applied to all the areas.  She  withstood this part of the procedure very well.  Estimated blood loss was  less than 100 mL.   COMPLICATIONS:  None.   ADDENDUM:  After this was completed, the right mastopexy had been re-done to  re-ellipse the nipple areolar complex in a superior lollypop type mastopexy  to 20 cm from the suprasternal notch.  The skin was de-epithelized  and then the subcutaneous layers were closed with 2-0 Monocryl times two  layers and a running subcuticular incision of 3-0 Monocryl throughout the  area.  Steri-Strips and soft dressings  were applied to all the areas.  She  withstood this part of the procedure very well as well and was taken to the  recovery room in good condition.                                               Yaakov Guthrie. [name], M.D.    Cathie Hoops  D:  08/05/2003  T:  08/05/2003  Job:  [id number]   cc:   Rose Phi. Young, M.D.  [address]  [place]  [place] [address]  Fax: [phone]

## 2011-03-19 NOTE — Discharge Summary (Signed)
   NAME:  [name], [name]                        ACCOUNT NO.:  [id number]   MEDICAL RECORD NO.:  [id number]                   PATIENT TYPE:  INP   LOCATION:  5737                                 FACILITY:  [facility]   PHYSICIAN:  Yaakov Guthrie. [name], M.D.           DATE OF BIRTH:  18-Sep-1971   DATE OF ADMISSION:  08/05/2003  DATE OF DISCHARGE:  08/07/2003                                 DISCHARGE SUMMARY   HOSPITAL COURSE:  This is a 40 year old lady admitted to the hospital for  left mastectomy for breast cancer. She also has ptosis involving her right  breast. She underwent a left mastectomy by Dr. Francina Ames and  reconstruction with tissue expander by myself on August 05, 2003.  Postoperatively, she has done excellently and is ready for discharge today,  August 07, 2003.   DISCHARGE DIAGNOSES:  1. Left breast cancer.  2. Right breast ptosis.   CONDITION ON DISCHARGE:  Improved.   COMPLICATIONS:  None.   MEDICATIONS:  1. Percocet one to two tabs p.o. q.4h. p.r.n. pain, #30 tablets.  2. Duricef 500 mg b.i.d. for five days.   POSTOPERATIVE FOLLOWUP:  In my office in approximately four days for  dressing change and removal of drain. The patient is to call if she has any  medical problems, temperature greater than 102, pulse greater than 120, or  any fever or chills.                                                Yaakov Guthrie. [name], M.D.    Cathie Hoops  D:  08/07/2003  T:  08/07/2003  Job:  [id number]   cc:   Rose Phi. Young, M.D.  [address]  [place]  [place] [address]  Fax: [phone]

## 2011-03-19 NOTE — Op Note (Signed)
NAME:  Victoria Delacruz, Victoria Delacruz                        ACCOUNT NO.:  [id number]   MEDICAL RECORD NO.:  [id number]                   PATIENT TYPE:  INP   LOCATION:  2550                                 FACILITY:  [facility]   PHYSICIAN:  Rose Phi. Young, M.D.                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   OPERATION PERFORMED:  1. Blue dye injection.  2. Left sentinel lymph node biopsy.  3. Left total mastectomy.   SURGEON:  Rose Phi. Maple Hudson, M.D.   ASSISTANT:  Magnus Ivan, RN   ANESTHESIA:  General.   DESCRIPTION OF PROCEDURE:  Prior to coming to the operating room 1 mCi of  technetium sulfur colloid was injected intradermally in the periareolar  area.   After suitable general anesthesia was induced, the patient was placed in the  supine position with both arms extended on the arm board.  5mL of a mixture  of 2mL of methylene blue and 3mL of injectable saline injected in the  subareolar tissue.  The breast was then gently massaged for about three  minutes.   We then prepped and draped both breasts and axilla out.  A short transverse  left axillary incision was made with dissection through the subcutaneous  tissue to the clavipectoral fascia.  Just deep to that was obvious blue  lymphatic going into several blue and hot nodes and I actually removed four,  I believe.  These were submitted to the pathologist for Touch Prep  evaluation.  While that was being done a short transverse elliptical  incision incorporating the nipple areolar complex was then outlined in order  to do a skin sparing mastectomy.  Incisions were made and then we carefully  dissected the superior flap to near the clavicle and then medially to the  sternum and inferiorly to the rectus fascia at the inframammary fold and  then laterally to the latissimus dorsi muscle.  Having completed this, we  then removed the breast by dissecting from medial to lateral.  In the low  axilla, we removed and completed the removal of the breast.   The sentinel nodes were negative.  Hemostasis was obtained with the cautery  and we thoroughly irrigated the field with saline.  Dr. Louisa Second  then was present to do a reconstruction which will be in a separate note.    Subcutaneous closure with 4-0 Monocryl and Steri-Strips carried out.  Dressing applied.  The patient was then transferred to the recovery room in  satisfactory condition having tolerated the procedure well.  Rose Phi. Maple Hudson, M.D.    PRY/MEDQ  D:  08/05/2003  T:  08/05/2003  Job:  [id number]

## 2011-03-25 ENCOUNTER — Encounter (INDEPENDENT_AMBULATORY_CARE_PROVIDER_SITE_OTHER): Payer: Self-pay | Admitting: General Surgery

## 2011-03-25 DIAGNOSIS — N6452 Nipple discharge: Secondary | ICD-10-CM | POA: Insufficient documentation

## 2011-03-25 DIAGNOSIS — Z803 Family history of malignant neoplasm of breast: Secondary | ICD-10-CM | POA: Insufficient documentation

## 2011-03-25 DIAGNOSIS — R5383 Other fatigue: Secondary | ICD-10-CM

## 2011-03-25 DIAGNOSIS — R61 Generalized hyperhidrosis: Secondary | ICD-10-CM | POA: Insufficient documentation

## 2011-03-25 DIAGNOSIS — R0602 Shortness of breath: Secondary | ICD-10-CM | POA: Insufficient documentation

## 2011-04-02 ENCOUNTER — Encounter (HOSPITAL_BASED_OUTPATIENT_CLINIC_OR_DEPARTMENT_OTHER): Payer: Self-pay | Admitting: Oncology

## 2011-04-02 ENCOUNTER — Other Ambulatory Visit (HOSPITAL_COMMUNITY): Payer: Self-pay | Admitting: Oncology

## 2011-04-02 DIAGNOSIS — IMO0001 Reserved for inherently not codable concepts without codable children: Secondary | ICD-10-CM

## 2011-04-02 DIAGNOSIS — Z170 Estrogen receptor positive status [ER+]: Secondary | ICD-10-CM

## 2011-04-02 DIAGNOSIS — C50419 Malignant neoplasm of upper-outer quadrant of unspecified female breast: Secondary | ICD-10-CM

## 2011-04-02 DIAGNOSIS — M7989 Other specified soft tissue disorders: Secondary | ICD-10-CM

## 2011-04-02 LAB — CBC WITH DIFFERENTIAL/PLATELET
BASO%: 0.7 % (ref 0.0–2.0)
Basophils Absolute: 0 10*3/uL (ref 0.0–0.1)
EOS%: 2.4 % (ref 0.0–7.0)
Eosinophils Absolute: 0.1 10*3/uL (ref 0.0–0.5)
HCT: 39.2 % (ref 34.8–46.6)
HGB: 13.4 g/dL (ref 11.6–15.9)
LYMPH%: 27.8 % (ref 14.0–49.7)
MCH: 31.3 pg (ref 25.1–34.0)
MCHC: 34.2 g/dL (ref 31.5–36.0)
MCV: 91.6 fL (ref 79.5–101.0)
MONO#: 0.3 10*3/uL (ref 0.1–0.9)
MONO%: 4.7 % (ref 0.0–14.0)
NEUT#: 3.6 10*3/uL (ref 1.5–6.5)
NEUT%: 64.4 % (ref 38.4–76.8)
Platelets: 272 10*3/uL (ref 145–400)
RBC: 4.28 10*6/uL (ref 3.70–5.45)
RDW: 13.6 % (ref 11.2–14.5)
WBC: 5.6 10*3/uL (ref 3.9–10.3)
lymph#: 1.6 10*3/uL (ref 0.9–3.3)

## 2011-04-02 LAB — COMPREHENSIVE METABOLIC PANEL
ALT: 14 U/L (ref 0–35)
AST: 21 U/L (ref 0–37)
Albumin: 4.3 g/dL (ref 3.5–5.2)
Alkaline Phosphatase: 95 U/L (ref 39–117)
BUN: 7 mg/dL (ref 6–23)
CO2: 27 mEq/L (ref 19–32)
Calcium: 9.4 mg/dL (ref 8.4–10.5)
Chloride: 107 mEq/L (ref 96–112)
Creatinine, Ser: 0.78 mg/dL (ref 0.50–1.10)
Glucose, Bld: 114 mg/dL — ABNORMAL HIGH (ref 70–99)
Potassium: 3.7 mEq/L (ref 3.5–5.3)
Sodium: 142 mEq/L (ref 135–145)
Total Bilirubin: 0.3 mg/dL (ref 0.3–1.2)
Total Protein: 7.8 g/dL (ref 6.0–8.3)

## 2011-04-02 LAB — LACTATE DEHYDROGENASE: LDH: 194 U/L (ref 94–250)

## 2011-04-05 ENCOUNTER — Other Ambulatory Visit (INDEPENDENT_AMBULATORY_CARE_PROVIDER_SITE_OTHER): Payer: Self-pay | Admitting: General Surgery

## 2011-04-05 ENCOUNTER — Other Ambulatory Visit: Payer: Self-pay | Admitting: Obstetrics and Gynecology

## 2011-04-05 DIAGNOSIS — Z853 Personal history of malignant neoplasm of breast: Secondary | ICD-10-CM

## 2011-04-05 DIAGNOSIS — Z9010 Acquired absence of unspecified breast and nipple: Secondary | ICD-10-CM

## 2011-04-19 ENCOUNTER — Ambulatory Visit: Payer: Self-pay

## 2011-04-28 ENCOUNTER — Ambulatory Visit: Payer: Self-pay | Admitting: Family Medicine

## 2011-05-01 ENCOUNTER — Inpatient Hospital Stay (HOSPITAL_COMMUNITY)
Admission: AD | Admit: 2011-05-01 | Discharge: 2011-05-01 | Disposition: A | Discharge status: Home / Self Care | Payer: Self-pay | Source: Ambulatory Visit | Attending: Obstetrics and Gynecology | Admitting: Obstetrics and Gynecology

## 2011-05-01 ENCOUNTER — Inpatient Hospital Stay (HOSPITAL_COMMUNITY): Payer: Self-pay

## 2011-05-01 DIAGNOSIS — N949 Unspecified condition associated with female genital organs and menstrual cycle: Secondary | ICD-10-CM | POA: Insufficient documentation

## 2011-05-01 DIAGNOSIS — N925 Other specified irregular menstruation: Secondary | ICD-10-CM | POA: Insufficient documentation

## 2011-05-01 DIAGNOSIS — N938 Other specified abnormal uterine and vaginal bleeding: Secondary | ICD-10-CM | POA: Insufficient documentation

## 2011-05-01 LAB — CBC
HCT: 30.8 % — ABNORMAL LOW (ref 36.0–46.0)
Hemoglobin: 10.5 g/dL — ABNORMAL LOW (ref 12.0–15.0)
MCH: 31.1 pg (ref 26.0–34.0)
MCHC: 34.1 g/dL (ref 30.0–36.0)
MCV: 91.1 fL (ref 78.0–100.0)
Platelets: 225 10*3/uL (ref 150–400)
RBC: 3.38 MIL/uL — ABNORMAL LOW (ref 3.87–5.11)
RDW: 13.6 % (ref 11.5–15.5)
WBC: 6.3 10*3/uL (ref 4.0–10.5)

## 2011-05-01 LAB — WET PREP, GENITAL
Trich, Wet Prep: NONE SEEN
Yeast Wet Prep HPF POC: NONE SEEN

## 2011-05-01 LAB — URINALYSIS, ROUTINE W REFLEX MICROSCOPIC
Bilirubin Urine: NEGATIVE
Glucose, UA: NEGATIVE mg/dL
Ketones, ur: NEGATIVE mg/dL
Leukocytes, UA: NEGATIVE
Nitrite: NEGATIVE
Protein, ur: NEGATIVE mg/dL
Specific Gravity, Urine: 1.025 (ref 1.005–1.030)
Urobilinogen, UA: 0.2 mg/dL (ref 0.0–1.0)
pH: 6 (ref 5.0–8.0)

## 2011-05-01 LAB — URINE MICROSCOPIC-ADD ON

## 2011-05-01 LAB — POCT PREGNANCY, URINE: Preg Test, Ur: NEGATIVE

## 2011-05-03 LAB — GC/CHLAMYDIA PROBE AMP, GENITAL
Chlamydia, DNA Probe: NEGATIVE
GC Probe Amp, Genital: NEGATIVE

## 2011-05-17 ENCOUNTER — Observation Stay (HOSPITAL_COMMUNITY)
Admission: AD | Admit: 2011-05-17 | Discharge: 2011-05-18 | Disposition: A | Discharge status: Home / Self Care | Payer: Self-pay | Source: Ambulatory Visit | Attending: Obstetrics & Gynecology | Admitting: Obstetrics & Gynecology

## 2011-05-17 ENCOUNTER — Ambulatory Visit (INDEPENDENT_AMBULATORY_CARE_PROVIDER_SITE_OTHER): Payer: Self-pay | Admitting: Family Medicine

## 2011-05-17 ENCOUNTER — Emergency Department (HOSPITAL_COMMUNITY)
Admission: EM | Admit: 2011-05-17 | Discharge: 2011-05-17 | Disposition: A | Discharge status: Other Acute Inpatient Hospital | Payer: Self-pay | Attending: Emergency Medicine | Admitting: Emergency Medicine

## 2011-05-17 ENCOUNTER — Encounter: Payer: Self-pay | Admitting: Family Medicine

## 2011-05-17 ENCOUNTER — Other Ambulatory Visit: Payer: Self-pay | Admitting: Obstetrics and Gynecology

## 2011-05-17 VITALS — BP 126/80 | HR 110 | Wt 150.0 lb

## 2011-05-17 DIAGNOSIS — R55 Syncope and collapse: Secondary | ICD-10-CM | POA: Insufficient documentation

## 2011-05-17 DIAGNOSIS — N920 Excessive and frequent menstruation with regular cycle: Secondary | ICD-10-CM

## 2011-05-17 DIAGNOSIS — D509 Iron deficiency anemia, unspecified: Secondary | ICD-10-CM

## 2011-05-17 DIAGNOSIS — C50919 Malignant neoplasm of unspecified site of unspecified female breast: Secondary | ICD-10-CM | POA: Insufficient documentation

## 2011-05-17 DIAGNOSIS — Z853 Personal history of malignant neoplasm of breast: Secondary | ICD-10-CM | POA: Insufficient documentation

## 2011-05-17 DIAGNOSIS — N925 Other specified irregular menstruation: Secondary | ICD-10-CM | POA: Insufficient documentation

## 2011-05-17 DIAGNOSIS — N949 Unspecified condition associated with female genital organs and menstrual cycle: Secondary | ICD-10-CM | POA: Insufficient documentation

## 2011-05-17 DIAGNOSIS — D649 Anemia, unspecified: Secondary | ICD-10-CM | POA: Insufficient documentation

## 2011-05-17 DIAGNOSIS — N938 Other specified abnormal uterine and vaginal bleeding: Secondary | ICD-10-CM | POA: Insufficient documentation

## 2011-05-17 HISTORY — DX: Fibromyalgia: M79.7

## 2011-05-17 HISTORY — DX: Essential (primary) hypertension: I10

## 2011-05-17 LAB — COMPREHENSIVE METABOLIC PANEL
ALT: 12 U/L (ref 0–35)
AST: 22 U/L (ref 0–37)
Albumin: 3.4 g/dL — ABNORMAL LOW (ref 3.5–5.2)
Alkaline Phosphatase: 90 U/L (ref 39–117)
BUN: 10 mg/dL (ref 6–23)
CO2: 24 mEq/L (ref 19–32)
Calcium: 8.7 mg/dL (ref 8.4–10.5)
Chloride: 106 mEq/L (ref 96–112)
Creatinine, Ser: 0.81 mg/dL (ref 0.50–1.10)
GFR calc Af Amer: >60 mL/min (ref >60)
GFR calc non Af Amer: >60 mL/min (ref >60)
Glucose, Bld: 104 mg/dL — ABNORMAL HIGH (ref 70–99)
Potassium: 2.8 mEq/L — ABNORMAL LOW (ref 3.5–5.1)
Sodium: 137 mEq/L (ref 135–145)
Total Bilirubin: 0.3 mg/dL (ref 0.3–1.2)
Total Protein: 6.9 g/dL (ref 6.0–8.3)

## 2011-05-17 LAB — URINALYSIS, ROUTINE W REFLEX MICROSCOPIC
Glucose, UA: NEGATIVE mg/dL
Ketones, ur: 15 mg/dL — AB
Nitrite: NEGATIVE
Protein, ur: 30 mg/dL — AB
Specific Gravity, Urine: 1.029 (ref 1.005–1.030)
Urobilinogen, UA: 0.2 mg/dL (ref 0.0–1.0)
pH: 5 (ref 5.0–8.0)

## 2011-05-17 LAB — DIFFERENTIAL
Basophils Absolute: 0 10*3/uL (ref 0.0–0.1)
Basophils Relative: 0 % (ref 0–1)
Eosinophils Absolute: 0.1 10*3/uL (ref 0.0–0.7)
Eosinophils Relative: 1 % (ref 0–5)
Lymphocytes Relative: 15 % (ref 12–46)
Lymphs Abs: 1.7 10*3/uL (ref 0.7–4.0)
Monocytes Absolute: 0.4 10*3/uL (ref 0.1–1.0)
Monocytes Relative: 4 % (ref 3–12)
Neutro Abs: 9.1 10*3/uL — ABNORMAL HIGH (ref 1.7–7.7)
Neutrophils Relative %: 80 % — ABNORMAL HIGH (ref 43–77)

## 2011-05-17 LAB — CBC
HCT: 23.8 % — ABNORMAL LOW (ref 36.0–46.0)
Hemoglobin: 8 g/dL — ABNORMAL LOW (ref 12.0–15.0)
MCH: 31.7 pg (ref 26.0–34.0)
MCHC: 33.6 g/dL (ref 30.0–36.0)
MCV: 94.4 fL (ref 78.0–100.0)
Platelets: 336 10*3/uL (ref 150–400)
RBC: 2.52 MIL/uL — ABNORMAL LOW (ref 3.87–5.11)
RDW: 15.5 % (ref 11.5–15.5)
WBC: 11.3 10*3/uL — ABNORMAL HIGH (ref 4.0–10.5)

## 2011-05-17 LAB — URINE MICROSCOPIC-ADD ON

## 2011-05-17 LAB — POCT HEMOGLOBIN: Hemoglobin: 7.6

## 2011-05-17 LAB — PREGNANCY, URINE: Preg Test, Ur: NEGATIVE

## 2011-05-17 LAB — PREPARE RBC (CROSSMATCH)

## 2011-05-17 LAB — ABO/RH: ABO/RH(D): O NEG

## 2011-05-17 MED ORDER — IBUPROFEN 800 MG PO TABS: 800.0000 mg | ORAL_TABLET | Freq: Three times a day (TID) | ORAL | Status: DC | PRN

## 2011-05-17 MED ORDER — SODIUM CHLORIDE 0.9 % IV SOLN
Freq: Once | INTRAVENOUS | Status: AC
  Administered 2011-05-17: 22:00:00 via INTRAVENOUS

## 2011-05-17 MED ORDER — MEDROXYPROGESTERONE ACETATE 10 MG PO TABS: ORAL_TABLET | ORAL | Status: DC

## 2011-05-17 NOTE — Progress Notes (Signed)
Dr. Dion Body at bedside with pt discussing plan of care.

## 2011-05-17 NOTE — Progress Notes (Signed)
  Subjective:    Patient ID: Victoria Delacruz, female    DOB: 09/11/1971, 40 y.o.   MRN: [id number]  HPI Here for persistent vaginal bleeding and cramping since 6/20, seen in MAU on 6/30 for this, Hbg at that time was 10.5, she was started on Provera 10 mg for 10 days, the bleeding has not stopped. She did have an apt. With [facility] clinic this month 7/30 to treat uterine fibroids.  She has primary breast CA with a reoccurrence in 2011, she completed chemo and radiation this past year.  She has known uterine fibroids that have been a source of menorrhagia for some time.  When she was on chemo her menses stopped.   Review of Systems     Objective:   Physical Exam    Hbg finger stick was 7.3 today, BP 123/80 prior to the fall and 149/80 after the fall,     Assessment & Plan:  Syncope:  Patient went to the bathroom and passed out, she hit her head.  Will transfer to ER for evaluation of secondary injuries.  She may need blood as she has dropped 3 points in 2 weeks.  She will eventually need treatment for her fibroids, surgery or ablation procedure.  She cannot have estrogens because of her breast cancer history,she is taking Tamoxifen.

## 2011-05-17 NOTE — Progress Notes (Signed)
Dr. Dion Body on unit reviewing chart and receiving report from Dr. Estanislado Pandy

## 2011-05-18 ENCOUNTER — Encounter (HOSPITAL_COMMUNITY): Payer: Self-pay | Admitting: Obstetrics and Gynecology

## 2011-05-18 ENCOUNTER — Ambulatory Visit (INDEPENDENT_AMBULATORY_CARE_PROVIDER_SITE_OTHER): Payer: Self-pay | Admitting: General Surgery

## 2011-05-18 ENCOUNTER — Inpatient Hospital Stay (HOSPITAL_COMMUNITY): Payer: Self-pay

## 2011-05-18 LAB — CBC
HCT: 21.2 % — ABNORMAL LOW (ref 36.0–46.0)
HCT: 23.4 % — ABNORMAL LOW (ref 36.0–46.0)
Hemoglobin: 7.2 g/dL — ABNORMAL LOW (ref 12.0–15.0)
Hemoglobin: 8 g/dL — ABNORMAL LOW (ref 12.0–15.0)
MCH: 31.7 pg (ref 26.0–34.0)
MCH: 30.5 pg (ref 26.0–34.0)
MCHC: 34 g/dL (ref 30.0–36.0)
MCHC: 34.2 g/dL (ref 30.0–36.0)
MCV: 93.4 fL (ref 78.0–100.0)
MCV: 89.3 fL (ref 78.0–100.0)
Platelets: 249 10*3/uL (ref 150–400)
Platelets: 198 10*3/uL (ref 150–400)
RBC: 2.27 MIL/uL — ABNORMAL LOW (ref 3.87–5.11)
RBC: 2.62 MIL/uL — ABNORMAL LOW (ref 3.87–5.11)
RDW: 16.4 % — ABNORMAL HIGH (ref 11.5–15.5)
RDW: 16.8 % — ABNORMAL HIGH (ref 11.5–15.5)
WBC: 10.6 10*3/uL — ABNORMAL HIGH (ref 4.0–10.5)
WBC: 9.1 10*3/uL (ref 4.0–10.5)

## 2011-05-18 LAB — COMPREHENSIVE METABOLIC PANEL
ALT: 9 U/L (ref 0–35)
AST: 16 U/L (ref 0–37)
Albumin: 2.5 g/dL — ABNORMAL LOW (ref 3.5–5.2)
Alkaline Phosphatase: 69 U/L (ref 39–117)
BUN: 5 mg/dL — ABNORMAL LOW (ref 6–23)
CO2: 18 mEq/L — ABNORMAL LOW (ref 19–32)
Calcium: 7.8 mg/dL — ABNORMAL LOW (ref 8.4–10.5)
Chloride: 110 mEq/L (ref 96–112)
Creatinine, Ser: 0.64 mg/dL (ref 0.50–1.10)
GFR calc Af Amer: >60 mL/min (ref >60)
GFR calc non Af Amer: >60 mL/min (ref >60)
Glucose, Bld: 173 mg/dL — ABNORMAL HIGH (ref 70–99)
Potassium: 3.4 mEq/L — ABNORMAL LOW (ref 3.5–5.1)
Sodium: 137 mEq/L (ref 135–145)
Total Bilirubin: 0.4 mg/dL (ref 0.3–1.2)
Total Protein: 5.8 g/dL — ABNORMAL LOW (ref 6.0–8.3)

## 2011-05-18 LAB — PREPARE RBC (CROSSMATCH)

## 2011-05-18 MED ORDER — DSS 100 MG PO CAPS: 100.0000 mg | ORAL_CAPSULE | Freq: Every day | ORAL | Status: AC

## 2011-05-18 MED ORDER — DEXTROSE IN LACTATED RINGERS 5 % IV SOLN
INTRAVENOUS | Status: DC
  Administered 2011-05-18: 15:00:00 via INTRAVENOUS

## 2011-05-18 MED ORDER — ACETAMINOPHEN 325 MG PO TABS
650.0000 mg | ORAL_TABLET | Freq: Once | ORAL | Status: AC
  Administered 2011-05-18: 650 mg via ORAL
  Filled 2011-05-18: qty 2

## 2011-05-18 MED ORDER — TRANEXAMIC ACID 650 MG PO TABS: 1300.0000 mg | ORAL_TABLET | Freq: Three times a day (TID) | ORAL | Status: AC

## 2011-05-18 MED ORDER — OXYCODONE-ACETAMINOPHEN 5-325 MG PO TABS
1.0000 | ORAL_TABLET | ORAL | Status: DC | PRN
  Administered 2011-05-18: 1 via ORAL
  Administered 2011-05-18: 2 via ORAL
  Filled 2011-05-18: qty 2
  Filled 2011-05-18: qty 1

## 2011-05-18 MED ORDER — OXYCODONE-ACETAMINOPHEN 5-325 MG PO TABS: 1.0000 | ORAL_TABLET | ORAL | Status: AC | PRN

## 2011-05-18 MED ORDER — DIPHENHYDRAMINE HCL 25 MG PO CAPS
25.0000 mg | ORAL_CAPSULE | Freq: Once | ORAL | Status: AC
  Administered 2011-05-18: 25 mg via ORAL
  Filled 2011-05-18: qty 1

## 2011-05-18 MED ORDER — SODIUM CHLORIDE 0.9 % IV SOLN
INTRAVENOUS | Status: DC
  Administered 2011-05-18: 03:00:00 via INTRAVENOUS

## 2011-05-18 MED ORDER — FERROUS SULFATE 325 (65 FE) MG PO TABS
325.0000 mg | ORAL_TABLET | Freq: Three times a day (TID) | ORAL | Status: DC
  Administered 2011-05-18: 325 mg via ORAL
  Filled 2011-05-18 (×2): qty 1

## 2011-05-18 MED ORDER — FERROUS SULFATE 325 (65 FE) MG PO TABS: 325.0000 mg | ORAL_TABLET | Freq: Three times a day (TID) | ORAL | Status: DC

## 2011-05-18 MED ORDER — TRANEXAMIC ACID 650 MG PO TABS: 1300.0000 mg | ORAL_TABLET | Freq: Three times a day (TID) | ORAL | Status: DC

## 2011-05-18 MED ORDER — TRANEXAMIC ACID 650 MG PO TABS
650.0000 mg | ORAL_TABLET | Freq: Three times a day (TID) | ORAL | Status: DC
  Administered 2011-05-18: 650 mg via ORAL
  Filled 2011-05-18: qty 1

## 2011-05-18 MED ORDER — ACETAMINOPHEN 325 MG PO TABS: 650.0000 mg | ORAL_TABLET | ORAL | Status: DC | PRN

## 2011-05-18 MED ORDER — DOCUSATE SODIUM 100 MG PO CAPS
100.0000 mg | ORAL_CAPSULE | Freq: Every day | ORAL | Status: DC
  Administered 2011-05-18: 100 mg via ORAL
  Filled 2011-05-18: qty 1

## 2011-05-18 MED ORDER — TRANEXAMIC ACID 650 MG PO TABS
650.0000 mg | ORAL_TABLET | Freq: Once | ORAL | Status: AC
  Administered 2011-05-18: 650 mg via ORAL
  Filled 2011-05-18: qty 1

## 2011-05-18 MED ORDER — TRANEXAMIC ACID 650 MG PO TABS
1300.0000 mg | ORAL_TABLET | Freq: Three times a day (TID) | ORAL | Status: AC
  Administered 2011-05-18: 1300 mg via ORAL
  Filled 2011-05-18: qty 2

## 2011-05-18 NOTE — Progress Notes (Signed)
Pt seen.  States bleeding slightly improved, changing a pad every 2-3 hr with some clots.  She denies any symptoms at this time.  Plan of discharge d/w pt and agrees with treatment plan.  Questions asked and answered.

## 2011-05-18 NOTE — Discharge Summary (Signed)
Physician Discharge Summary  Patient ID: Victoria Delacruz MRN: [id number] DOB/AGE: June 15, 1971 40 y.o.  Admit date: 05/17/2011 Discharge date: 05/18/2011  Admission Diagnoses: Menorrhagia, h/o Breast cancer  Discharge Diagnoses: Menorrhagia s/p transfusion, h/o breast cancer   Discharged Condition: stable  Hospital Course: Pt admitted for menorrhagia.  Transfused 3U Prbcs.  Hgb stable. VSS. Ultrasound normal with some mild uterine enlargement.  No anatomical abnormality seen.  Pt d/c in stable condition  Consults: none  Significant Diagnostic Studies: labs:  Lab Results  Component Value Date   WBC 9.1 05/18/2011   HGB 8.0* 05/18/2011   HCT 23.4* 05/18/2011   MCV 89.3 05/18/2011   PLT 198 05/18/2011     Treatments: treated with lysteda, pain meds, and iron  Disposition: home  Discharge Orders    Future Appointments: Provider: Department: Dept Phone: Center:   05/21/2011 7:40 AM [facility] [phone] [facility]   06/07/2011 12:45 PM Chancy Milroy, DO [facility] [phone] [facility]   06/14/2011 10:20 AM [name], MD [facility] [phone] None     Future Orders Please Complete By Expires   Diet general      Increase activity slowly      Driving Restrictions      Comments:   Be careful driving with pain medications and antidepressants as they may make you sleepy.   Sexual Activity Restrictions      Comments:   Pelvic rest until seen in clinic   Call MD for:  temperature >100.4      Call MD for:  severe uncontrolled pain      Call MD for:  persistant nausea and vomiting        Current Discharge Medication List    START taking these medications   Details  docusate sodium 100 MG CAPS Take 100 mg by mouth daily. Qty: 60 capsule, Refills: 0    ferrous sulfate 325 (65 FE) MG tablet Take 1 tablet (325 mg total) by mouth 3 (three) times daily with meals. Qty: 90 tablet, Refills: 1    oxyCODONE-acetaminophen (PERCOCET) 5-325 MG  per tablet Take 1-2 tablets by mouth every 4 (four) hours as needed (every 4-6 hours prn pain). Qty: 45 tablet, Refills: 0    tranexamic acid (LYSTEDA) 650 MG TABS Take 2 tablets (1,300 mg total) by mouth 3 (three) times daily. Qty: 24 tablet, Refills: 0      CONTINUE these medications which have NOT CHANGED   Details  hydrochlorothiazide (MICROZIDE) 12.5 MG capsule Take 1 capsule (12.5 mg total) by mouth daily. Qty: 30 capsule, Refills: 11   Associated Diagnoses: Hypertension    ibuprofen (ADVIL,MOTRIN) 800 MG tablet Take 1 tablet (800 mg total) by mouth every 8 (eight) hours as needed for pain. Qty: 50 tablet, Refills: 1   Associated Diagnoses: Menorrhagia    potassium chloride (KLOR-CON) 20 MEQ packet Take 20 mEq by mouth daily. Ask patient to clarify type, frequency and dosage not noted on medical history form dated 04/27/10.     SUMAtriptan (IMITREX) 25 MG tablet Take 25 mg by mouth. 1 tab  by mouth may repeat 1 tab if headache still present after 2 hours. Do not exceed 4 tabs in 24 hours     venlafaxine (EFFEXOR) 100 MG tablet Take 50 mg by mouth 2 (two) times daily.        STOP taking these medications     medroxyPROGESTERone (PROVERA) 10 MG tablet      ferrous fumarate (FERRO-SEQUELS)  50 MG CR tablet      tamoxifen (NOLVADEX) 10 MG tablet        Follow-up Information    Follow up with [facility]. Make an appointment in 2 weeks. (GYN clinic.  you have an appointment on 06/07/2011 @1245pm  as well)    Contact information:   [address] [place] [address]          Signed: Arlynn Stare 05/18/2011, 6:50 PM

## 2011-05-18 NOTE — Progress Notes (Signed)
UR Chart review completed.  

## 2011-05-18 NOTE — Progress Notes (Signed)
Pt continues to pass 2-3 cm clots when up, and gets up without assistance despite cautions not to do so. She had fallen twice yesterday and is high fall-risk. Pt also discards voids and pads without leeting RN inspect.

## 2011-05-18 NOTE — Plan of Care (Signed)
Problem: Phase I Progression Outcomes Goal: OOB as tolerated unless otherwise ordered Pt continues to get out of bed without assistance.

## 2011-05-18 NOTE — H&P (Addendum)
Victoria Delacruz is an 40 y.o. female G1P0010 with h/o uterine fibroids s/p myomectomy in 2009 recurrent Breast CA s/p Radiation and Chemo completed in Jan 2012  Presents via transfer from [facility].   She went to her FP yesterday due to heavy vaginal bleeding.  While in the office, she had a syncopal episode.  She was transferred to ED where she had a second syncopal episode.  Pt reports vaginally bleeding is extremely  Heavy, requiring she change pads every hour.  She reports baseball size clots.  Pt was evaluated on 6/30 in MAU and was placed on Provera x 10 days.  Pt reports no improvement in her bleeding.  She was awaiting a gyn appt at [facility] which was scheduled for 05/31/11.  Pertinent Gynecological History: Menses: No menses x 1 yr since on chemo  Spotting started within the last 2 months Bleeding: dysfunctional uterine bleeding Contraception: none DES exposure: unknown Blood transfusions: s/p One unit at [facility] long prior to admission Sexually transmitted diseases: Pt denies Previous GYN Procedures: DNC with ?Hysteroscopic myomectomy 2009, Laparoscopy converted to Laparotomy for an ectopic pregnancy Last mammogram: Unknown.  Awaiting PET scan s/p chemo/radiation Last pap: unknown  OB History: G1, P0010  Menstrual History:  Patient's last menstrual period was 04/17/2011.    Past Medical History  Diagnosis Date  . ECTOPIC PREGNANCY 1997 and 2011    x 2  . Hypertension   . Fibromyalgia   . Breast cancer 2004 and 2011    Past Surgical History  Procedure Date  . Mastectomy     complete with reconstruction x 3  . Laparoscopy for ectopic pregnancy   . Laparoscopy w/ mini-laparotomy     Family History  Problem Relation Age of Onset  . Hypertension Mother   . Hypertension Father     History of blood clots in lungs  . Lymphoma Sister     Hodgkins    Social History:  reports that she has never smoked. She does not have any smokeless tobacco history on file. She reports  that she does not drink alcohol or use illicit drugs.  Allergies: No Known Allergies  Prescriptions prior to admission  Medication Sig Dispense Refill  . medroxyPROGESTERone (PROVERA) 10 MG tablet Three tabs daily for up to 10 days, until seen by GYN  30 tablet  0  . ferrous fumarate (FERRO-SEQUELS) 50 MG CR tablet Take 50 mg by mouth 1 dose over 24 hours. Ask patient to very type, dosage and frequency not noted on medical history form dated 04/27/10.       . hydrochlorothiazide (MICROZIDE) 12.5 MG capsule Take 1 capsule (12.5 mg total) by mouth daily.  30 capsule  11  . ibuprofen (ADVIL,MOTRIN) 800 MG tablet Take 1 tablet (800 mg total) by mouth every 8 (eight) hours as needed for pain.  50 tablet  1  . potassium chloride (KLOR-CON) 20 MEQ packet Take 20 mEq by mouth daily. Ask patient to clarify type, frequency and dosage not noted on medical history form dated 04/27/10.       Marland Kitchen SUMAtriptan (IMITREX) 25 MG tablet Take 25 mg by mouth. 1 tab  by mouth may repeat 1 tab if headache still present after 2 hours. Do not exceed 4 tabs in 24 hours       . tamoxifen (NOLVADEX) 10 MG tablet Take 10 mg by mouth 2 (two) times daily.        Marland Kitchen venlafaxine (EFFEXOR) 100 MG tablet Take 50 mg  by mouth 2 (two) times daily.          Review of Systems  Constitutional: Positive for malaise/fatigue.  Respiratory: Negative for shortness of breath.   Cardiovascular: Negative for chest pain and palpitations.  Gastrointestinal: Positive for constipation. Negative for nausea, vomiting, abdominal pain and diarrhea.  Musculoskeletal: Positive for myalgias.  Neurological: Positive for dizziness and weakness.    Blood pressure 90/58, pulse 90, temperature 99.4 F (37.4 C), temperature source Oral, resp. rate 16, last menstrual period 04/17/2011, SpO2 99.00%. Physical Exam  Constitutional: She is oriented to person, place, and time. She appears well-developed and well-nourished.  HENT:  Head: Normocephalic and  atraumatic.  Eyes: EOM are normal.  Neck: Normal range of motion.  Cardiovascular: Normal rate and regular rhythm.   Respiratory: Effort normal and breath sounds normal. No respiratory distress. She has no wheezes. She has no rales. She exhibits no tenderness.  GI: Soft. Bowel sounds are normal. She exhibits no distension and no mass. There is no tenderness. There is no rebound and no guarding.  Genitourinary:       No blood on vulva.  Speculum filled with large clot in vault.  Cleared, collected and sent to path.  Cvx with normal appearance.  Appears dilated.  Slow ooze from os.  No polyps noted.  Uterus nontender 8 weeks, mobile.  No adnexal masses, nontender.  GC/Chl NOT done.  Musculoskeletal: Normal range of motion. She exhibits no edema and no tenderness.  Neurological: She is alert and oriented to person, place, and time.  Skin: No erythema.    Results for orders placed during the hospital encounter of 05/17/11 (from the past 24 hour(s))  COMPREHENSIVE METABOLIC PANEL     Status: Abnormal   Collection Time   05/18/11 12:35 AM      Component Value Range   Sodium 137  135 - 145 (mEq/L)   Potassium 3.4 (*) 3.5 - 5.1 (mEq/L)   Chloride 110  96 - 112 (mEq/L)   CO2 18 (*) 19 - 32 (mEq/L)   Glucose, Bld 173 (*) 70 - 99 (mg/dL)   BUN 5 (*) 6 - 23 (mg/dL)   Creatinine, Ser 1.61  0.50 - 1.10 (mg/dL)   Calcium 7.8 (*) 8.4 - 10.5 (mg/dL)   Total Protein 5.8 (*) 6.0 - 8.3 (g/dL)   Albumin 2.5 (*) 3.5 - 5.2 (g/dL)   AST 16  0 - 37 (U/L)   ALT 9  0 - 35 (U/L)   Alkaline Phosphatase 69  39 - 117 (U/L)   Total Bilirubin 0.4  0.3 - 1.2 (mg/dL)   GFR calc non Af Amer >60  >60 (mL/min)   GFR calc Af Amer >60  >60 (mL/min)  PREPARE RBC (CROSSMATCH)     Status: Normal   Collection Time   05/18/11 12:35 AM      Component Value Range   Order Confirmation ORDER PROCESSED BY BLOOD BANK    TYPE AND SCREEN     Status: Normal (Preliminary result)   Collection Time   05/18/11 12:35 AM      Component  Value Range   ABO/RH(D) O NEG     Antibody Screen NEG     Sample Expiration 05/21/2011     Unit Number [id number]     Blood Component Type RED CELLS,LR     Unit division 00     Status of Unit ISSUED     Transfusion Status OK TO TRANSFUSE     Crossmatch Result Compatible  Unit Number [id number]     Blood Component Type RED CELLS,LR     Unit division 00     Status of Unit ISSUED     Transfusion Status OK TO TRANSFUSE     Crossmatch Result Compatible    CBC     Status: Abnormal   Collection Time   05/18/11 12:35 AM      Component Value Range   WBC 10.6 (*) 4.0 - 10.5 (K/uL)   RBC 2.27 (*) 3.87 - 5.11 (MIL/uL)   Hemoglobin 7.2 (*) 12.0 - 15.0 (g/dL)   HCT 95.2 (*) 84.1 - 46.0 (%)   MCV 93.4  78.0 - 100.0 (fL)   MCH 31.7  26.0 - 34.0 (pg)   MCHC 34.0  30.0 - 36.0 (g/dL)   RDW 32.4 (*) 40.1 - 15.5 (%)   Platelets 249  150 - 400 (K/uL)    No results found.  Assessment/Plan:  Menorrhagia with syncope.  S/p 1 unit PRBC. Hg on arrival to women's unit 7.2.  (Hg 10.5 on 6/30) 2 additional units ordered. Lysteda 650 mg 2 tablets TID prescribed. Iron. Pelvic ultrasound.  Specimen sent to path.  Consider D&C if no improvement in bleeding, especially in that pt has been on Tamoxifen.  Evalynne Locurto 05/18/2011, 5:48 AM  Addendum:  Pt with  Less than 1 cm laceration inside bottom lip.  Hemostatic.

## 2011-05-18 NOTE — Plan of Care (Signed)
Problem: Phase I Progression Outcomes Goal: OOB as tolerated unless otherwise ordered Outcome: Progressing Pt fell twice 7/16 and has been asked repeatedly not to get out of bed without assistance; however, she continues to get up. Goal: Hemodynamically stable Outcome: Not Progressing Continues to bleed heavily

## 2011-05-18 NOTE — Progress Notes (Signed)
Subjective: Patient reports bleeding has slowed to significantly lighter flow.  No dizziness.  Reports lower abdominal pain "almost 10/10."  Unrelieved with Tylenol. Pt states she rarely takes Tamoxifen due to worsening of hot flashes.  Since February since originally prescribed, she state she may have taken one tablet every 3 weeks.  Objective: I have reviewed patient's vital signs. RN reports pt saturated 5 green pads.  Bleeding has slowed significantly within the last hour.  General: alert HEENT:  Periorbital edema.  Laceration of lip hemostatic CV:  RRR Lungs:  CTA bilaterally Abdomen:  Soft, subjective moderate tenderness Pelvic: Pad observed.  75% soiled within 1 hour. Hg 7.2  CMP K-3.4 Glucose 178    Assessment/Plan: Menorrhagia.  Improving with Lysteda. S/p 3 units PRBCs.  CBC 4-6 hours post transfusion. Abdominal pain- order Percocet 5/325mg .  Awaiting pelvic US. Consider endometrial sampling.   Pt states she rarely takes Tamoxifen so risk of endometrial cancer decreased. Low sodium diet. SCDs. Will check out to on coming attending.  LOS: 1 day    Victoria Delacruz 05/18/2011, 7:15 AM

## 2011-05-19 ENCOUNTER — Telehealth: Payer: Self-pay | Admitting: Family Medicine

## 2011-05-19 ENCOUNTER — Other Ambulatory Visit: Payer: Self-pay | Admitting: Family Medicine

## 2011-05-19 LAB — TYPE AND SCREEN
ABO/RH(D): O NEG
Antibody Screen: NEGATIVE
Unit division: 0
Unit division: 0

## 2011-05-19 MED ORDER — MEDROXYPROGESTERONE ACETATE 10 MG PO TABS: 20.0000 mg | ORAL_TABLET | Freq: Every day | ORAL | Status: DC

## 2011-05-19 NOTE — Telephone Encounter (Addendum)
Spoke  with patient.  She was in office 05/17/2011 and passed out and was transported to hospital . She received 3 units of blood she states. Was given RX for Lysteda 650 mg . [facility] not have . And even if they did it would cost her  over $100.00. States she is still bleeding very heavy .  States on June 30 she was given RX for Provera 10 mg to take for 10 days . It did not help with bleeding and that is why she came back to office 07/16. States Luretha Murphy was going to write her an RX for increased dose of Provera but then she passed out and was sent to hospital and did not get the RX.   states she feels very light headed and feels like she may pass out. Advised to continue lying down and stay by phone . Will call back.   she wants to know if MD can give her rx for provera since [facility] does not have RX that  she got in the hospital.  Will consult with Dr. Hulen Luster.

## 2011-05-19 NOTE — Telephone Encounter (Signed)
Pt asking to speak with RN about a medication she was prescribed in the hospital to stop her bleeding.

## 2011-05-19 NOTE — Progress Notes (Signed)
Discussed pt with Dr Jolayne Panther [facility].  She suggested provera 20mg  q day until clinic appt with OBGYN.

## 2011-05-20 LAB — TYPE AND SCREEN
ABO/RH(D): O NEG
Antibody Screen: NEGATIVE
Unit division: 0
Unit division: 0

## 2011-05-21 ENCOUNTER — Telehealth: Payer: Self-pay | Admitting: Family Medicine

## 2011-05-21 ENCOUNTER — Ambulatory Visit
Admission: RE | Admit: 2011-05-21 | Discharge: 2011-05-21 | Disposition: A | Discharge status: Home / Self Care | Payer: Self-pay | Source: Ambulatory Visit | Attending: Obstetrics and Gynecology | Admitting: Obstetrics and Gynecology

## 2011-05-21 DIAGNOSIS — Z853 Personal history of malignant neoplasm of breast: Secondary | ICD-10-CM

## 2011-05-21 DIAGNOSIS — Z9010 Acquired absence of unspecified breast and nipple: Secondary | ICD-10-CM

## 2011-05-21 NOTE — Telephone Encounter (Signed)
Will route to both her PCP and [place].

## 2011-05-21 NOTE — Telephone Encounter (Signed)
[name] is having a lot of pain and need to have a hysterectomy.  Was taken to [facility] same day of visit here.  Was told that she needed to have emergency surgery but it was never performed.  She is very distraught about this and need to have something done.  Would like to have Luretha Murphy who saw her call to speak with her regarding this.

## 2011-05-21 NOTE — Telephone Encounter (Signed)
Spoke with patient . States the bleeding has slowed since starting the provera. Using 4-5 pads a day now. Having abdominal pain but oxycodone helps pain some until it starts wearing off . States she is very dizzy and she is feeling like her HGB is down again due to all the blood loss prior to starting the Provera.    When she was admitted to [facility]  05/17/2011  was told that she needed an emergency hysterectomy.  She waited all day on 05/18/2011 and then that night she was discharged. She didn't understand. Then she called here on  07/18 because [facility] could not get the lysteda she was prescribed.  She reported continued bleeding and was started on Provera    Consulted with Dr. Mauricio Po . Advised to go back to [facility] ED for further evaluation  today.

## 2011-05-25 ENCOUNTER — Inpatient Hospital Stay (HOSPITAL_COMMUNITY)
Admission: AD | Admit: 2011-05-25 | Discharge: 2011-05-26 | DRG: 761 | Disposition: A | Discharge status: Home / Self Care | Payer: Self-pay | Source: Ambulatory Visit | Attending: Obstetrics and Gynecology | Admitting: Obstetrics and Gynecology

## 2011-05-25 ENCOUNTER — Encounter (HOSPITAL_COMMUNITY): Payer: Self-pay | Admitting: *Deleted

## 2011-05-25 DIAGNOSIS — D500 Iron deficiency anemia secondary to blood loss (chronic): Secondary | ICD-10-CM | POA: Diagnosis present

## 2011-05-25 DIAGNOSIS — N920 Excessive and frequent menstruation with regular cycle: Principal | ICD-10-CM | POA: Diagnosis present

## 2011-05-25 DIAGNOSIS — D259 Leiomyoma of uterus, unspecified: Secondary | ICD-10-CM

## 2011-05-25 DIAGNOSIS — D509 Iron deficiency anemia, unspecified: Secondary | ICD-10-CM

## 2011-05-25 HISTORY — DX: Benign neoplasm of connective and other soft tissue, unspecified: D21.9

## 2011-05-25 LAB — DIFFERENTIAL
Basophils Absolute: 0 10*3/uL (ref 0.0–0.1)
Basophils Relative: 0 % (ref 0–1)
Eosinophils Absolute: 0.1 10*3/uL (ref 0.0–0.7)
Eosinophils Relative: 1 % (ref 0–5)
Lymphocytes Relative: 18 % (ref 12–46)
Lymphs Abs: 1.4 10*3/uL (ref 0.7–4.0)
Monocytes Absolute: 0.3 10*3/uL (ref 0.1–1.0)
Monocytes Relative: 3 % (ref 3–12)
Neutro Abs: 5.7 10*3/uL (ref 1.7–7.7)
Neutrophils Relative %: 77 % (ref 43–77)

## 2011-05-25 LAB — CBC
HCT: 19.9 % — ABNORMAL LOW (ref 36.0–46.0)
Hemoglobin: 6.4 g/dL — CL (ref 12.0–15.0)
MCH: 29.7 pg (ref 26.0–34.0)
MCHC: 31.7 g/dL (ref 30.0–36.0)
MCV: 93.9 fL (ref 78.0–100.0)
Platelets: 346 10*3/uL (ref 150–400)
RBC: 2.12 MIL/uL — ABNORMAL LOW (ref 3.87–5.11)
RDW: 17.8 % — ABNORMAL HIGH (ref 11.5–15.5)
WBC: 7.3 10*3/uL (ref 4.0–10.5)

## 2011-05-25 LAB — PREPARE RBC (CROSSMATCH)

## 2011-05-25 MED ORDER — SODIUM CHLORIDE 0.9 % IV SOLN
INTRAVENOUS | Status: DC
  Administered 2011-05-25: 15:00:00 via INTRAVENOUS

## 2011-05-25 MED ORDER — MEDROXYPROGESTERONE ACETATE 10 MG PO TABS
20.0000 mg | ORAL_TABLET | Freq: Every day | ORAL | Status: DC
  Administered 2011-05-25 – 2011-05-26 (×2): 20 mg via ORAL
  Filled 2011-05-25 (×3): qty 2

## 2011-05-25 MED ORDER — LACTATED RINGERS IV SOLN: INTRAVENOUS | Status: DC

## 2011-05-25 MED ORDER — KETOROLAC TROMETHAMINE 60 MG/2ML IM SOLN
60.0000 mg | Freq: Once | INTRAMUSCULAR | Status: AC
  Administered 2011-05-25: 60 mg via INTRAMUSCULAR
  Filled 2011-05-25: qty 2

## 2011-05-25 MED ORDER — DIPHENHYDRAMINE HCL 25 MG PO CAPS
25.0000 mg | ORAL_CAPSULE | Freq: Once | ORAL | Status: AC
  Administered 2011-05-25: 25 mg via ORAL
  Filled 2011-05-25: qty 1

## 2011-05-25 MED ORDER — ACETAMINOPHEN 325 MG PO TABS
650.0000 mg | ORAL_TABLET | Freq: Once | ORAL | Status: AC
  Administered 2011-05-25: 650 mg via ORAL
  Filled 2011-05-25: qty 2

## 2011-05-25 NOTE — ED Provider Notes (Addendum)
History     Chief Complaint  Patient presents with  . Vaginal Bleeding   HPI  40 yo presents for abnormal vaginal bleeding. G2 p 0020. Was seen here on 6/30 with same complaint, rx and took Provera for sxs.  The medication lightened the bleeding.  Was seen on 7/16 by S. Martina Sinner, NP at [facility].  During this visit she fell during the visit from dizziness, transported to [facility] states she fell there as well.  She was transported to [facility] from there.  She was admitted here, received a total of 3 units of blood, and discharged the next day.  She continued to bleed, now passing some clots and having abdominal pain.  Had ultrasounds on both dates.  Has fibroids.  Has not taken anything for pain and is asking for medication.     Past Medical History  Diagnosis Date  . ECTOPIC PREGNANCY 1997 and 2011    x 2  . Hypertension   . Fibromyalgia   . Breast cancer 2004 and 2011    Past Surgical History  Procedure Date  . Mastectomy     complete with reconstruction x 3  . Laparoscopy for ectopic pregnancy   . Laparoscopy w/ mini-laparotomy   . Reconstruction breast immediate / delayed w/ tissue expander 2004    Family History  Problem Relation Age of Onset  . Hypertension Mother   . Hypertension Father     History of blood clots in lungs  . Lymphoma Sister     Hodgkins    History  Substance Use Topics  . Smoking status: Never Smoker   . Smokeless tobacco: Never Used  . Alcohol Use: No    Allergies: No Known Allergies  Prescriptions prior to admission  Medication Sig Dispense Refill  . docusate sodium 100 MG CAPS Take 100 mg by mouth daily.  60 capsule  0  . ferrous sulfate 325 (65 FE) MG tablet Take 1 tablet (325 mg total) by mouth 3 (three) times daily with meals.  90 tablet  1  . hydrochlorothiazide (MICROZIDE) 12.5 MG capsule Take 1 capsule (12.5 mg total) by mouth daily.  30 capsule  11  . ibuprofen (ADVIL,MOTRIN) 800 MG tablet Take 1 tablet (800 mg total) by mouth every 8 (eight)  hours as needed for pain.  50 tablet  1  . medroxyPROGESTERone (PROVERA) 10 MG tablet Take 2 tablets (20 mg total) by mouth daily.  50 tablet  0  . oxyCODONE-acetaminophen (PERCOCET) 5-325 MG per tablet Take 1-2 tablets by mouth every 4 (four) hours as needed (every 4-6 hours prn pain).  45 tablet  0  . potassium chloride (KLOR-CON) 20 MEQ packet Take 20 mEq by mouth daily. Ask patient to clarify type, frequency and dosage not noted on medical history form dated 04/27/10.       Marland Kitchen SUMAtriptan (IMITREX) 25 MG tablet Take 25 mg by mouth. 1 tab  by mouth may repeat 1 tab if headache still present after 2 hours. Do not exceed 4 tabs in 24 hours       . venlafaxine (EFFEXOR) 100 MG tablet Take 50 mg by mouth 2 (two) times daily.          Review of Systems  Constitutional: Negative for fever.  Gastrointestinal: Positive for abdominal pain. Negative for nausea and vomiting.  Neurological: Weakness: dizzy and lightheaded.   Physical Exam   Blood pressure 141/85, pulse 98, temperature 99 F (37.2 C), temperature source Oral, resp. rate 20, height 5'  3.5" (1.613 m), weight 152 lb (68.947 kg), last menstrual period 04/23/2011, SpO2 100.00%.  Physical Exam  Constitutional: She is oriented to person, place, and time. She appears well-developed and well-nourished.  Neck: Neck supple.  Respiratory: Effort normal.  GI: Soft. There is tenderness.  Genitourinary: Uterus is enlarged and tender. Cervix exhibits no motion tenderness. Right adnexum displays no tenderness. Left adnexum displays no tenderness. Bleeding: moderate amount of bleeding with moderate clots.  Neurological: She is alert and oriented to person, place, and time.  Skin: Skin is warm and dry.    MAU Course  Procedures CBC   Results for orders placed during the hospital encounter of 05/25/11 (from the past 24 hour(s))  CBC     Status: Abnormal   Collection Time   05/25/11  1:05 PM      Component Value Range   WBC 7.3  4.0 - 10.5 (K/uL)    RBC 2.12 (*) 3.87 - 5.11 (MIL/uL)   Hemoglobin 6.4 (*) 12.0 - 15.0 (g/dL)   HCT 86.5 (*) 78.4 - 46.0 (%)   MCV 93.9  78.0 - 100.0 (fL)   MCH 29.7  26.0 - 34.0 (pg)   MCHC 31.7  30.0 - 36.0 (g/dL)   RDW 69.6 (*) 29.5 - 15.5 (%)   Platelets 346  150 - 400 (K/uL)  DIFFERENTIAL     Status: Normal   Collection Time   05/25/11  1:05 PM      Component Value Range   Neutrophils Relative 77  43 - 77 (%)   Neutro Abs 5.7  1.7 - 7.7 (K/uL)   Lymphocytes Relative 18  12 - 46 (%)   Lymphs Abs 1.4  0.7 - 4.0 (K/uL)   Monocytes Relative 3  3 - 12 (%)   Monocytes Absolute 0.3  0.1 - 1.0 (K/uL)   Eosinophils Relative 1  0 - 5 (%)   Eosinophils Absolute 0.1  0.0 - 0.7 (K/uL)   Basophils Relative 0  0 - 1 (%)   Basophils Absolute 0.0  0.0 - 0.1 (K/uL)     MDM Consulted with Dr. Jolayne Panther.  Dr. Jolayne Panther will come to see patient.  Plans admission.   IV begun with 1 liter of Normal Saline at 125cc/hr  Assessment and Plan  Abnormal vaginal bleeding Known fibroids Anemia   Dr. Jolayne Panther will admit patient.  KEY,EVE M 05/25/2011, 12:47 PM    Matt Holmes, NP 06/05/11 1810

## 2011-05-25 NOTE — ED Notes (Signed)
Eve Key RNP in wi th pt  

## 2011-05-25 NOTE — Progress Notes (Signed)
Pt states she was seen in MAU last week, was admitted and received 3 unit blood transfusion. Has an appointment 8-2 to evaluate for a hysterectomy but is in so much pain she had to come in today. States she has also continued to bleed, just changed pad and no bleeding now.

## 2011-05-25 NOTE — Progress Notes (Signed)
Pt states that she had a blood transfusion last Monday ( 3 units) and went home Tuesday evening. She c/o that she is having pain in her lower abd and is taking two percocet every 6 hrs without relief. She states the bleeding "is not all that bad" but is still passing some clots.

## 2011-05-26 LAB — CBC
HCT: 34.2 % — ABNORMAL LOW (ref 36.0–46.0)
Hemoglobin: 11.6 g/dL — ABNORMAL LOW (ref 12.0–15.0)
MCH: 30.4 pg (ref 26.0–34.0)
MCHC: 33.9 g/dL (ref 30.0–36.0)
MCV: 89.8 fL (ref 78.0–100.0)
Platelets: 327 10*3/uL (ref 150–400)
RBC: 3.81 MIL/uL — ABNORMAL LOW (ref 3.87–5.11)
RDW: 16.2 % — ABNORMAL HIGH (ref 11.5–15.5)
WBC: 7.1 10*3/uL (ref 4.0–10.5)

## 2011-05-26 NOTE — Discharge Summary (Signed)
Physician Discharge Summary  Patient ID: Victoria Delacruz MRN: [id number] DOB/AGE: 1971/03/08 40 y.o.  Admit date: 05/25/2011 Discharge date: 05/26/2011  Admission Diagnoses: symptomatic anemia, menorrhagia  Discharge Diagnoses: resolved anemia Active Problems:  * No active hospital problems. *    Discharged Condition: stable  Hospital Course: 40yo gravida 1 para 0010 who presented to the hospital secondary to symptomatic anemia. Patient has a history of fibroid uterus resulting in menorrhagia with plans to followup at the [facility] hospital clinic on 06/03/2011 for further management. The patient was currently being treated for her menorrhagia with 20 mg of Provera daily. Patient presented to the MAU complaining of heavy vaginal bleeding, lightheadedness and dizziness and her admission hemoglobin was found to be 6.4. Patient was admitted to receive blood transfusion and she was continued on her Provera. On the day of discharge patient reported improvement in her vaginal bleeding. She also denied symptoms of lightheadedness or dizziness or shortness of breath. She was able to ambulate without difficulty. Patient was deemed stable for discharge with plans to followup at the [facility] hospital clinic on August 2 for endometrial biopsy. Her post transfusion hemoglobin was found to be 11.  Consults: none  Significant Diagnostic Studies: labs: admission hg 6.4 on discharge after 4 units pRBC 11  Treatments: 4 units pRBC  Discharge Exam: Blood pressure 122/79, pulse 76, temperature 98.5 F (36.9 C), temperature source Oral, resp. rate 18, height 5' 3.5" (1.613 m), weight 68.947 kg (152 lb), last menstrual period 04/23/2011, SpO2 100.00%. General appearance: alert and no distress GI: soft, non-tender; bowel sounds normal; no masses,  no organomegaly Extremities: extremities normal, atraumatic, no cyanosis or edema and no edema, redness or tenderness in the calves or thighs  Disposition: Home or  Self Care  Discharge Orders    Future Appointments: Provider: Department: Dept Phone: Center:   06/03/2011 2:00 PM Chancy Milroy, DO [facility] [phone] [facility]   06/14/2011 10:20 AM [name], MD [facility] [phone] None     Current Discharge Medication List    CONTINUE these medications which have NOT CHANGED   Details  docusate sodium 100 MG CAPS Take 100 mg by mouth daily. Qty: 60 capsule, Refills: 0    ferrous sulfate 325 (65 FE) MG tablet Take 325 mg by mouth 3 (three) times daily with meals.      hydrochlorothiazide (,MICROZIDE/HYDRODIURIL,) 12.5 MG capsule Take 12.5 mg by mouth daily.      ibuprofen (ADVIL,MOTRIN) 800 MG tablet Take 800 mg by mouth every 8 (eight) hours as needed.      oxyCODONE-acetaminophen (PERCOCET) 5-325 MG per tablet Take 1-2 tablets by mouth every 4 (four) hours as needed (every 4-6 hours prn pain). Qty: 45 tablet, Refills: 0    medroxyPROGESTERone (PROVERA) 10 MG tablet Take 20 mg by mouth daily.        STOP taking these medications     potassium chloride (KLOR-CON) 20 MEQ packet      SUMAtriptan (IMITREX) 25 MG tablet      venlafaxine (EFFEXOR) 100 MG tablet        Follow-up Information    Follow up with [facility]. (as scheduled on June 03, 2011)    Contact information:   [address] [place] [address]          Signed: Catalina Antigua 05/26/2011, 12:12 PM

## 2011-05-26 NOTE — Progress Notes (Signed)
Subjective: Patient is without complaints and reports some improvements in her vaginal bleeding. She denies chest pain, SOB, lightheadedness or dizziness.     Objective: I have reviewed patient's vital signs.  General: alert and no distress GI: soft, non-tender; bowel sounds normal; no masses,  no organomegaly Extremities: extremities normal, atraumatic, no cyanosis or edema and no edema, redness or tenderness in the calves or thighs   Assessment/Plan: 40yo G1P0010 with menorrhagia admitted secondary to symptomatic anemia -Patient hemodynamically stable for discharge home - Follow-up in clinic for endometrial biopsy and further management  - Discharge instructions provided.  LOS: 1 day    Tamlyn Sides 05/26/2011, 12:08 PM

## 2011-05-27 LAB — TYPE AND SCREEN
ABO/RH(D): O NEG
Antibody Screen: NEGATIVE
Unit division: 0
Unit division: 0
Unit division: 0
Unit division: 0

## 2011-05-28 DIAGNOSIS — Z8759 Personal history of other complications of pregnancy, childbirth and the puerperium: Secondary | ICD-10-CM | POA: Insufficient documentation

## 2011-05-31 ENCOUNTER — Inpatient Hospital Stay (HOSPITAL_COMMUNITY)
Admission: AD | Admit: 2011-05-31 | Discharge: 2011-05-31 | Disposition: A | Discharge status: Home / Self Care | Payer: Self-pay | Source: Ambulatory Visit | Attending: Obstetrics and Gynecology | Admitting: Obstetrics and Gynecology

## 2011-05-31 ENCOUNTER — Encounter (HOSPITAL_COMMUNITY): Payer: Self-pay | Admitting: *Deleted

## 2011-05-31 DIAGNOSIS — N920 Excessive and frequent menstruation with regular cycle: Secondary | ICD-10-CM | POA: Insufficient documentation

## 2011-05-31 DIAGNOSIS — N946 Dysmenorrhea, unspecified: Secondary | ICD-10-CM | POA: Insufficient documentation

## 2011-05-31 MED ORDER — HYDROCODONE-IBUPROFEN 7.5-200 MG PO TABS: 1.0000 | ORAL_TABLET | ORAL | Status: AC | PRN

## 2011-05-31 MED ORDER — HYDROMORPHONE HCL 2 MG PO TABS: 2.0000 mg | ORAL_TABLET | ORAL | Status: DC | PRN

## 2011-05-31 MED ORDER — MEGESTROL ACETATE 20 MG PO TABS: 40.0000 mg | ORAL_TABLET | Freq: Three times a day (TID) | ORAL | Status: DC

## 2011-05-31 MED ORDER — KETOROLAC TROMETHAMINE 60 MG/2ML IM SOLN
60.0000 mg | Freq: Once | INTRAMUSCULAR | Status: AC
  Administered 2011-05-31: 60 mg via INTRAMUSCULAR
  Filled 2011-05-31: qty 2

## 2011-05-31 MED ORDER — HYDROMORPHONE HCL 1 MG/ML IJ SOLN
2.0000 mg | Freq: Once | INTRAMUSCULAR | Status: AC
  Administered 2011-05-31: 2 mg via INTRAMUSCULAR
  Filled 2011-05-31: qty 2

## 2011-05-31 NOTE — Progress Notes (Signed)
Pt has bled since June 22nd.  Had two transfusions on Jul 16th and 24th.  Is scheduled for biopsy on this Thurs, then will schedule hysterectomy after.  Pt says she can not wait, the pain is too severe.

## 2011-05-31 NOTE — ED Provider Notes (Signed)
History   The pt is a 40 year-old female who presents to MAU reporting menorrhagia and dysmenorrhea since the beginning of June. The bleeding is slightly better since increasing Provery from 10 mg QD to 20 mg QD in 05/18/11, but the pain has gotten worse . She has been hospitalized multiple times for these same complaints and received multiple blood transfusion since the problem began, last [facility] D/C 05/26/11. She has been taking Percocet w/ no improvement. She denies dizziness.     Pain resolved w/ Toradol and Dilaudid.  Chief Complaint  Patient presents with  . Vaginal Bleeding   HPI  Past Medical History  Diagnosis Date  . ECTOPIC PREGNANCY 1997 and 2011    x 2  . Hypertension   . Fibromyalgia   . Fibroid   . Breast cancer 2004 and 2011    Past Surgical History  Procedure Date  . Mastectomy     complete with reconstruction x 3  . Laparoscopy for ectopic pregnancy   . Laparoscopy w/ mini-laparotomy   . Reconstruction breast immediate / delayed w/ tissue expander 2004    Family History  Problem Relation Age of Onset  . Hypertension Mother   . Hypertension Father     History of blood clots in lungs  . Lymphoma Sister     Hodgkins  . Cancer Sister   . Cancer Maternal Aunt   . Cancer Maternal Uncle   . Cancer Maternal Grandmother   . Cancer Maternal Grandfather     History  Substance Use Topics  . Smoking status: Never Smoker   . Smokeless tobacco: Never Used  . Alcohol Use: No    OB History    Grav Para Term Preterm Abortions TAB SAB Ect Mult Living   2    2  1 1   0      Review of Systems Otherwise neg Physical Exam  BP 140/78  Pulse 104  Temp(Src) 98.8 F (37.1 C) (Oral)  Resp 20  Ht 5\' 3"  (1.6 m)  Wt 68.493 kg (151 lb)  BMI 26.75 kg/m2  LMP 04/23/2011 BP 110/63  Pulse 67  Temp(Src) 98.8 F (37.1 C) (Oral)  Resp 16  Ht 5\' 3"  (1.6 m)  Wt 68.493 kg (151 lb)  BMI 26.75 kg/m2  LMP 04/23/2011  Physical Exam Pelvic deferred. Small amount of BRB  on pad  ED Course  Procedures Assessment: 1. Menorrhagia, stable, but interfering w/ quality of life 2. Dysmenorrhea   Plan: 1. D/C home per consult w/ Dr. Jolayne Panther 2. Rx Vicoprofen and Dilaudid. (do not take w/in 4 hours of each other) 3. D/C Provera 4. Rx Megace. Take two tablets (40 mg) three times per day time three days,  then take two tablets (40 mg) two times per day time three days,  then take two tablets (40 mg)once per day

## 2011-05-31 NOTE — Progress Notes (Signed)
Informed mid level of pt complaints.  Will be in to assess pt shortly

## 2011-05-31 NOTE — Progress Notes (Signed)
Still bleeding, started 06/22. Adm 16 and the 24, received blood transfusion.  Dx with fibroids.  Was told needs to have biopsy, want hyst and then just do the pathology. States can not take the pain

## 2011-05-31 NOTE — Progress Notes (Signed)
Bleeding since June 22nd. Has a lot of pain and cramping.

## 2011-05-31 NOTE — ED Notes (Signed)
CNM Katrinka Blazing discussing medications for controlling bleeding and pain with patient

## 2011-06-01 ENCOUNTER — Ambulatory Visit (INDEPENDENT_AMBULATORY_CARE_PROVIDER_SITE_OTHER): Payer: Self-pay | Admitting: General Surgery

## 2011-06-02 NOTE — ED Provider Notes (Signed)
Agree with above note.  Jahon Bart 06/02/2011 2:12 PM

## 2011-06-03 ENCOUNTER — Ambulatory Visit (INDEPENDENT_AMBULATORY_CARE_PROVIDER_SITE_OTHER): Payer: Self-pay | Admitting: Family Medicine

## 2011-06-03 ENCOUNTER — Encounter: Payer: Self-pay | Admitting: Family Medicine

## 2011-06-03 ENCOUNTER — Other Ambulatory Visit (HOSPITAL_COMMUNITY)
Admission: RE | Admit: 2011-06-03 | Discharge: 2011-06-03 | Disposition: A | Discharge status: Home / Self Care | Payer: Self-pay | Source: Ambulatory Visit | Attending: Family Medicine | Admitting: Family Medicine

## 2011-06-03 VITALS — BP 144/89 | HR 89 | Temp 98.1°F | Ht 63.0 in | Wt 156.1 lb

## 2011-06-03 DIAGNOSIS — N938 Other specified abnormal uterine and vaginal bleeding: Secondary | ICD-10-CM

## 2011-06-03 DIAGNOSIS — N925 Other specified irregular menstruation: Secondary | ICD-10-CM

## 2011-06-03 DIAGNOSIS — D0590 Unspecified type of carcinoma in situ of unspecified breast: Secondary | ICD-10-CM

## 2011-06-03 DIAGNOSIS — N949 Unspecified condition associated with female genital organs and menstrual cycle: Secondary | ICD-10-CM

## 2011-06-03 DIAGNOSIS — D0510 Intraductal carcinoma in situ of unspecified breast: Secondary | ICD-10-CM

## 2011-06-03 LAB — POCT PREGNANCY, URINE: Preg Test, Ur: NEGATIVE

## 2011-06-03 NOTE — Progress Notes (Deleted)
°  Subjective:    Patient ID: Victoria Delacruz, female    DOB: April 09, 1971, 40 y.o.   MRN: [id number]  HPI    Review of Systems     Objective:   Physical Exam        Assessment & Plan:  Endometrial Biopsy Procedure Note  Pre-operative Diagnosis: ***  Post-operative Diagnosis: {ENT Condition:17813}  Indications: {endobx indication:16409}  Procedure Details   Urine pregnancy test {Actions; was done:16410}.  The risks (including infection, bleeding, pain, and uterine perforation) and benefits of the procedure were explained to the patient and {desc; verbal/written:16408} informed consent was obtained.  Antibiotic prophylaxis against endocarditis {WAS:16407} indicated.   The patient was placed in the dorsal lithotomy position.  Bimanual exam showed the uterus to be in the {Uterine position:16454} position.  A Graves' speculum inserted in the vagina, and the cervix prepped with povidone iodine.  Endocervical curettage with a Kevorkian curette {was/were/not:16407} performed.   A sharp tenaculum was applied to the {Desc; anatomic locations:16411} lip of the cervix for stabilization.  A sterile uterine sound was used to sound the uterus to a depth of {NUMBERS; 1-9.5 BY 0.5:16831}cm.  A {devices; endobx instrument:16412} was used to sample the endometrium.  Sample was sent for pathologic examination.  Condition: Stable  Complications: None  Plan:  The patient was advised to call for any fever or for prolonged or severe pain or bleeding. She was advised to use {meds; pain:16413} as needed for mild to moderate pain. She was advised to avoid vaginal intercourse for 48 hours or until the bleeding has completely stopped.  Attending Physician Documentation: {attending attestation:17855}

## 2011-06-03 NOTE — Progress Notes (Signed)
  Subjective:    Patient ID: Victoria Delacruz, female    DOB: [birth year], 40 y.o.   MRN: [id number]  HPI  Agree with PA-S Payton Doughty note  Review of Systems     Objective:   Physical Exam See note by Payton Doughty, PA-S       Assessment & Plan:  Endometrial Biopsy Procedure Note  Pre-operative Diagnosis: Dysfunctional Uterine Bleeding  Post-operative Diagnosis: same  Indications: abnormal uterine bleeding  Procedure Details   Urine pregnancy test was done today and result was NEG.  The risks (including infection, bleeding, pain, and uterine perforation) and benefits of the procedure were explained to the patient and Written informed consent was obtained.   The patient was placed in the dorsal lithotomy position.  Bimanual exam showed the uterus to be in the neutral position.  A Graves' speculum inserted in the vagina, and the cervix prepped with povidone iodine.   A Pipelle endometrial aspirator was used to sample the endometrium.  Sample was sent for pathologic examination.  Condition: Stable  Complications: None  Plan:  The patient was advised to call for any fever or for prolonged or severe pain or bleeding. She was advised to use NSAID as needed for mild to moderate pain. She was advised to avoid vaginal intercourse for 48 hours or until the bleeding has completely stopped.

## 2011-06-03 NOTE — Patient Instructions (Signed)
Endometrial Biopsy   This is a test in which a tissue sample (a biopsy) is taken from inside the uterus (womb). It is then looked at by a specialist under a microscope to see if the tissue is normal or abnormal. The endometrium is the lining of the uterus. This test helps determine where you are in your menstrual cycle and  how hormone levels are affecting the lining of the uterus. Another use for this test is to diagnose endometrial cancer, tuberculosis, polyps, or inflammatory conditions and to evaluate uterine bleeding.   MEANING OF TEST Your caregiver will go over the test results with you and discuss the importance and meaning of your results, as well as treatment options and the need for additional tests if necessary.   OBTAINING THE TEST RESULTS Make an appointment for 2 weeks from today to discuss your results with one of our surgeons.  Present to the MAU if you experience heavier than normal bleeding, or if you develop severe abdominal pain and fever >100.4 F.    Document Released: 02/18/2005  Document Re-Released: 01/14/2009 [facility] Patient Information 2011 [place], [place].

## 2011-06-03 NOTE — Progress Notes (Signed)
Pt here for endometrial biopsy today

## 2011-06-03 NOTE — Progress Notes (Signed)
Subjective:     [name] is an 40 y.o. G2P0020 woman who presents for irregular menses. She describes her bleeding as spotting starting in May of this year and then became heavy bleeding with clots in June.  She presented to the MAU on June 30th and was seen and discharged with provera.  She continued bleeding heavily and presented to [facility] on July 16th for a checkup where she fainted.  She was taken back to the MAU where she received 3 units of PRBC and doubled the dose of provera.  She returned to the MAU July 24th and received another 4 units of PRBC and megestrol prescription.  She presented to the MAU again July 31st for abdominal pain and was discharged with dilaudid.  The abdominal pain has decreased.  She is still currently bleeding and says the amount is about a light cycle.  She has a signifcant history of left breast cancer dx with DCIS in 2004 leading to a left mastectomy with reconstructive surgery.  The cancer returned in 2011 where she underwent chemo therapy, finishing in October 2011, and radiation, finishing January 2012.  She is here today regarding dysfunctional uterine bleeding and desire for hysterectomy.  Menstrual History: OB History    Grav Para Term Preterm Abortions TAB SAB Ect Mult Living   2    2  1 1   0       Patient's last menstrual period was 04/23/2011.    The following portions of the patient's history were reviewed and updated as appropriate: allergies, current medications, past family history, past medical history, past social history and past surgical history.  Review of Systems Pertinent items are noted in HPI.    Objective:    BP 144/89   Pulse 89   Temp(Src) 98.1 F (36.7 C) (Oral)   Ht 5\' 3"  (1.6 m)   Wt 156 lb 1.6 oz (70.806 kg)   BMI 27.65 kg/m2   LMP 04/23/2011  General:   alert, cooperative, appears stated age and mild distress  Neck:  no adenopathy, supple, symmetrical, trachea midline and thyroid not enlarged,  symmetric, no tenderness/mass/nodules  Cardiac  RRR, distinct s1/s2, no M/G/R  Lungs  CTAB  Abdomen:  mild tenderness, firm at lower abdomen  Pelvic:   cervix normal in appearance, external genitalia normal and slight vaginal bleeding.     Assessment:   1. dysfunctional uterine bleeding 2. ductal carcinoma in situ   Plan:   1. Endometriosis biopsy - done 2. F/U in approximately two weeks to discuss EMB results and surgical consultation for hysterectomy.    Payton Doughty PA-S2 16:38 Aug 2nd, 2012   Please ignore below         HPI    Review of Systems  Gastrointestinal: Positive for abdominal pain.  Genitourinary: Positive for vaginal bleeding and menstrual problem.  All other systems reviewed and are negative.    Physical Exam  Cardiovascular: Normal rate, regular rhythm, S1 normal and S2 normal.   Pulses:      Carotid pulses are 2+ on the right side.      Dorsalis pedis pulses are 2+ on the right side.  Pulmonary/Chest: Effort normal and breath sounds normal.  Abdominal: Soft. Normal appearance and bowel sounds are normal.

## 2011-06-04 LAB — POCT PREGNANCY, URINE: Preg Test, Ur: NEGATIVE

## 2011-06-07 ENCOUNTER — Ambulatory Visit (INDEPENDENT_AMBULATORY_CARE_PROVIDER_SITE_OTHER): Payer: Self-pay | Admitting: General Surgery

## 2011-06-07 ENCOUNTER — Ambulatory Visit: Payer: Self-pay | Admitting: Family Medicine

## 2011-06-10 ENCOUNTER — Encounter (HOSPITAL_BASED_OUTPATIENT_CLINIC_OR_DEPARTMENT_OTHER): Payer: Self-pay | Admitting: Oncology

## 2011-06-10 ENCOUNTER — Ambulatory Visit (HOSPITAL_COMMUNITY)
Admission: RE | Admit: 2011-06-10 | Discharge: 2011-06-10 | Disposition: A | Discharge status: Home / Self Care | Payer: Self-pay | Source: Ambulatory Visit | Attending: Oncology | Admitting: Oncology

## 2011-06-10 ENCOUNTER — Other Ambulatory Visit (HOSPITAL_COMMUNITY): Payer: Self-pay | Admitting: Oncology

## 2011-06-10 DIAGNOSIS — IMO0001 Reserved for inherently not codable concepts without codable children: Secondary | ICD-10-CM

## 2011-06-10 DIAGNOSIS — C50919 Malignant neoplasm of unspecified site of unspecified female breast: Secondary | ICD-10-CM | POA: Insufficient documentation

## 2011-06-10 DIAGNOSIS — Z170 Estrogen receptor positive status [ER+]: Secondary | ICD-10-CM

## 2011-06-10 DIAGNOSIS — C50419 Malignant neoplasm of upper-outer quadrant of unspecified female breast: Secondary | ICD-10-CM

## 2011-06-10 DIAGNOSIS — R0602 Shortness of breath: Secondary | ICD-10-CM | POA: Insufficient documentation

## 2011-06-10 LAB — CBC WITH DIFFERENTIAL/PLATELET
BASO%: 0.9 % (ref 0.0–2.0)
Basophils Absolute: 0.1 10*3/uL (ref 0.0–0.1)
EOS%: 1.2 % (ref 0.0–7.0)
Eosinophils Absolute: 0.1 10*3/uL (ref 0.0–0.5)
HCT: 30.5 % — ABNORMAL LOW (ref 34.8–46.6)
HGB: 9.9 g/dL — ABNORMAL LOW (ref 11.6–15.9)
LYMPH%: 35.2 % (ref 14.0–49.7)
MCH: 28.9 pg (ref 25.1–34.0)
MCHC: 32.5 g/dL (ref 31.5–36.0)
MCV: 88.9 fL (ref 79.5–101.0)
MONO#: 0.3 10*3/uL (ref 0.1–0.9)
MONO%: 5.1 % (ref 0.0–14.0)
NEUT#: 3.4 10*3/uL (ref 1.5–6.5)
NEUT%: 57.6 % (ref 38.4–76.8)
Platelets: 458 10*3/uL — ABNORMAL HIGH (ref 145–400)
RBC: 3.43 10*6/uL — ABNORMAL LOW (ref 3.70–5.45)
RDW: 14.4 % (ref 11.2–14.5)
WBC: 5.9 10*3/uL (ref 3.9–10.3)
lymph#: 2.1 10*3/uL (ref 0.9–3.3)

## 2011-06-10 LAB — LACTATE DEHYDROGENASE: LDH: 226 U/L (ref 94–250)

## 2011-06-10 LAB — COMPREHENSIVE METABOLIC PANEL
ALT: 9 U/L (ref 0–35)
AST: 19 U/L (ref 0–37)
Albumin: 3.7 g/dL (ref 3.5–5.2)
Alkaline Phosphatase: 94 U/L (ref 39–117)
BUN: 7 mg/dL (ref 6–23)
CO2: 24 mEq/L (ref 19–32)
Calcium: 10.7 mg/dL — ABNORMAL HIGH (ref 8.4–10.5)
Chloride: 101 mEq/L (ref 96–112)
Creatinine, Ser: 0.75 mg/dL (ref 0.50–1.10)
Glucose, Bld: 98 mg/dL (ref 70–99)
Potassium: 3.3 mEq/L — ABNORMAL LOW (ref 3.5–5.3)
Sodium: 136 mEq/L (ref 135–145)
Total Bilirubin: 0.2 mg/dL — ABNORMAL LOW (ref 0.3–1.2)
Total Protein: 7.7 g/dL (ref 6.0–8.3)

## 2011-06-14 ENCOUNTER — Encounter (INDEPENDENT_AMBULATORY_CARE_PROVIDER_SITE_OTHER): Payer: Self-pay | Admitting: General Surgery

## 2011-06-14 ENCOUNTER — Ambulatory Visit (INDEPENDENT_AMBULATORY_CARE_PROVIDER_SITE_OTHER): Payer: PRIVATE HEALTH INSURANCE | Admitting: General Surgery

## 2011-06-14 VITALS — BP 132/78 | HR 70 | Temp 97.8°F | Ht 63.0 in | Wt 150.0 lb

## 2011-06-14 DIAGNOSIS — C50912 Malignant neoplasm of unspecified site of left female breast: Secondary | ICD-10-CM

## 2011-06-14 DIAGNOSIS — C50919 Malignant neoplasm of unspecified site of unspecified female breast: Secondary | ICD-10-CM

## 2011-06-14 NOTE — Patient Instructions (Signed)
All of your wounds have healed, and there is no evidence of recurrent cancer in either breast or in the lymph nodes. You should get a right breast mammogram in July of 2012. I will remove  your  Port-A-Cath when Dr. Arline Asp  states that he is through using it. At your request, I'm going to refer you back to Dr. Louisa Second for consideration of symmetry procedure. Keep her appointment with Dr. Arline Asp. I will see you back in one year.

## 2011-06-14 NOTE — Progress Notes (Signed)
Victoria Delacruz is a 40 y.o. female.    Chief Complaint  Patient presents with  . Other    6 month recheck - breast    HPI HPI This 40 year old African American female returns for long-term followup regarding her recurrent left breast cancer. She is followed Victoria Delacruz, Victoria Delacruz, and Victoria Delacruz.  Past history is significant for a left total mastectomy and sentinel node biopsy in 2004 for ductal carcinoma in situ. BRCA1 and BRCA2 were negative. Victoria Delacruz put the tissue expander the left breast and he reconstructed the nipple and also performed a reduction on the right.  She was lost to followup until 2011 at which time she presented with a palpable nodule under the skin of the left breast in the upper-outer quadrant. Image guided biopsy showed invasive ductal carcinoma. We proceeded with wide local excision and left partial mastectomy of this area, but we were able to keep the implant in place. I also put a Victoria Delacruz in at that time. She has had adjuvant chemotherapy which finished in October 2011. She had radiation therapy thereafter which ended in January 2012. She had a right breast mammogram on May 21, 2011 which looks fine. A chest x-ray on June 10, 2011 is negative.  She is here with me today for long-term followup. She has asked about reconstruction to match the size of her breasts. She has no other complaint about her breast. She would like to get the Victoria Delacruz out.   Past Medical History  Diagnosis Date  . ECTOPIC PREGNANCY 1997 and 2011    x 2  . Hypertension   . Fibromyalgia   . Fibroid   . Breast cancer 2004 and 2011  . Anemia   . Arthritis   . Dizziness   . Family history of breast cancer     grandmother    Past Surgical History  Procedure Date  . Mastectomy     complete with reconstruction x 3  . Laparoscopy for ectopic pregnancy   . Laparoscopy w/ mini-laparotomy   . Reconstruction breast immediate / delayed w/ tissue  expander 2004    Family History  Problem Relation Age of Onset  . Hypertension Mother   . Hypertension Father     History of blood clots in lungs  . Lymphoma Sister     Hodgkins  . Cancer Sister   . Cancer Maternal Aunt   . Cancer Maternal Uncle   . Cancer Maternal Grandmother   . Cancer Maternal Grandfather     Social History History  Substance Use Topics  . Smoking status: Never Smoker   . Smokeless tobacco: Never Used  . Alcohol Use: No    No Known Allergies  Current Outpatient Prescriptions  Medication Sig Dispense Refill  . ferrous sulfate 325 (65 FE) MG tablet Take 325 mg by mouth 3 (three) times daily with meals.        . hydrochlorothiazide (,MICROZIDE/HYDRODIURIL,) 12.5 MG capsule Take 12.5 mg by mouth daily.        . Hydrocodone-Ibuprofen (VICOPROFEN PO) Take by mouth as needed.        Marland Kitchen HYDROmorphone (DILAUDID) 2 MG tablet Take 1 tablet (2 mg total) by mouth every 4 (four) hours as needed for pain.  20 tablet  0  . megestrol (MEGACE) 20 MG tablet Take 2 tablets (40 mg total) by mouth 3 (three) times daily.  90 tablet  0    Review of Systems ROS 10 system review of systems  is performed and is negative except as described above. Physical Exam Physical Exam  Patient looks well and is in no distress.  Neck no adenopathy mass or jugular venous distention.  Left breast reveals transverse scar well healed. Nipple reconstruction looks good. Implant feels good. There is no palpable nodules or ulcerations or axillary adenopathy. No sign of local recurrence.  Right breast reveals the scars from the reconstruction with no palpable mass no nodules no axillary adenopathy.  Chest Victoria Delacruz in the right infraclavicular area looks fine. Blood pressure 132/78, pulse 70, temperature 97.8 F (36.6 C), temperature source Temporal, height 5\' 3"  (1.6 m), weight 150 lb (68.04 kg), last menstrual period 04/23/2011.  Assessment/Plan Extensive ductal carcinoma in situ, status  post left total mastectomy sentinel and biopsy and reconstruction 2004.  Recurrent left invasive ductal breast cancer, upper outer quadrant, status post wide local excision and insertion of Victoria Delacruz May 28, 2010. No evidence of recurrence to date.   Status post adjuvant chemotherapy and adjuvant radiation therapy, completed in January 2012.  The patient was advised to get a right breast mammogram in July 2012 and see me thereafter for a one year followup.  She was about to keep her followup on this with Victoria Delacruz  She is referred to Victoria Delacruz for consultation regarding reconstructive issues.  I told her we would be happy to remove her Victoria Delacruz was Dr. Marshell Garfinkel and states that he is no longer using it. She will check with Victoria Delacruz  about this.  Victoria Delacruz M 06/14/2011, 11:23 AM

## 2011-06-22 ENCOUNTER — Encounter: Payer: Self-pay | Admitting: Family Medicine

## 2011-06-22 ENCOUNTER — Ambulatory Visit (INDEPENDENT_AMBULATORY_CARE_PROVIDER_SITE_OTHER): Payer: Self-pay | Admitting: Family Medicine

## 2011-06-22 DIAGNOSIS — I10 Essential (primary) hypertension: Secondary | ICD-10-CM

## 2011-06-22 DIAGNOSIS — D509 Iron deficiency anemia, unspecified: Secondary | ICD-10-CM

## 2011-06-22 DIAGNOSIS — M797 Fibromyalgia: Secondary | ICD-10-CM

## 2011-06-22 DIAGNOSIS — G43909 Migraine, unspecified, not intractable, without status migrainosus: Secondary | ICD-10-CM

## 2011-06-22 DIAGNOSIS — IMO0001 Reserved for inherently not codable concepts without codable children: Secondary | ICD-10-CM

## 2011-06-22 LAB — CBC
HCT: 33.3 % — ABNORMAL LOW (ref 36.0–46.0)
Hemoglobin: 11.1 g/dL — ABNORMAL LOW (ref 12.0–15.0)
MCH: 30.1 pg (ref 26.0–34.0)
MCHC: 33.3 g/dL (ref 30.0–36.0)
MCV: 90.2 fL (ref 78.0–100.0)
Platelets: 318 10*3/uL (ref 150–400)
RBC: 3.69 MIL/uL — ABNORMAL LOW (ref 3.87–5.11)
RDW: 15.5 % (ref 11.5–15.5)
WBC: 5.6 10*3/uL (ref 4.0–10.5)

## 2011-06-22 MED ORDER — HYDROCHLOROTHIAZIDE 12.5 MG PO CAPS: 12.5000 mg | ORAL_CAPSULE | Freq: Every day | ORAL | Status: DC

## 2011-06-22 MED ORDER — AMITRIPTYLINE HCL 25 MG PO TABS: 25.0000 mg | ORAL_TABLET | Freq: Every day | ORAL | Status: DC

## 2011-06-22 MED ORDER — MEGESTROL ACETATE 20 MG PO TABS: 40.0000 mg | ORAL_TABLET | Freq: Every day | ORAL | Status: AC

## 2011-06-22 NOTE — Progress Notes (Signed)
  Subjective:    Patient ID: Victoria Delacruz, female    DOB: 1971-06-06, 40 y.o.   MRN: [id number]  HPI fibromyalgia- saw rheumatologist for this and he suggested cymbalta but she has not gotten an rx for this.  Pain is the same.   Migraine- HA every other day, high stress, the same as her usual headaches.  imitrex doesn't help much.  No vision change or weakness  HTN- taking HCTZ, no SOB or CP.  Does not check BP at home  Bleeding- still having uterine bleeding but much lower on the megace.  Seeing GYN for possible hysterectomy on 9/10.  She is worried about running out of her megace.  No dizziness now.   Review of Systems See above    Objective:   Physical Exam Vital signs reviewed General appearance - alert, well appearing, and in no distress and oriented to person, place, and time Heart - normal rate, regular rhythm, normal S1, S2, no murmurs, rubs, clicks or gallops Chest - clear to auscultation, no wheezes, rales or rhonchi, symmetric air entry, no tachypnea, retractions or cyanosis Abdomen - soft, nontender, nondistended, no masses or organomegaly        Assessment & Plan:  Hypertension BP at goal. Continue current regimen  ANEMIA, IRON DEFICIENCY Taking iron, still bleeding but less.  Recheck CBC today. Would send for transfusion if Hgb <7.5  Fibromyalgia Amitriptyline may help this as well.  May need to increase dose.    MIGRAINE HEADACHE Amitriptyline for ppx.  Will have her RTC to recheck, likely increase dose.  Likely high stress is trigger

## 2011-06-22 NOTE — Assessment & Plan Note (Signed)
Amitriptyline for ppx.  Will have her RTC to recheck, likely increase dose.  Likely high stress is trigger

## 2011-06-22 NOTE — Assessment & Plan Note (Signed)
Amitriptyline may help this as well.  May need to increase dose.

## 2011-06-22 NOTE — Assessment & Plan Note (Signed)
BP at goal. Continue current regimen.  

## 2011-06-22 NOTE — Assessment & Plan Note (Signed)
Taking iron, still bleeding but less.  Recheck CBC today. Would send for transfusion if Hgb <7.5

## 2011-06-25 ENCOUNTER — Telehealth (INDEPENDENT_AMBULATORY_CARE_PROVIDER_SITE_OTHER): Payer: Self-pay

## 2011-06-25 NOTE — Telephone Encounter (Signed)
Per Gavin Pound, pt called and advised her that no Plastic surgeon will see her for breast semmitry issues. They do not accept project access. Gavin Pound states she has also tried to locate [employer] that will work with project access. Pt is to contact project access and see if they have a plastic surgery option for pts under there coverage.

## 2011-07-12 ENCOUNTER — Other Ambulatory Visit: Payer: Self-pay | Admitting: Obstetrics & Gynecology

## 2011-07-12 ENCOUNTER — Ambulatory Visit (INDEPENDENT_AMBULATORY_CARE_PROVIDER_SITE_OTHER): Payer: Self-pay | Admitting: Obstetrics & Gynecology

## 2011-07-12 ENCOUNTER — Encounter: Payer: Self-pay | Admitting: Obstetrics & Gynecology

## 2011-07-12 DIAGNOSIS — Z01818 Encounter for other preprocedural examination: Secondary | ICD-10-CM

## 2011-07-12 DIAGNOSIS — D259 Leiomyoma of uterus, unspecified: Secondary | ICD-10-CM

## 2011-07-12 DIAGNOSIS — N920 Excessive and frequent menstruation with regular cycle: Secondary | ICD-10-CM

## 2011-07-12 NOTE — Patient Instructions (Signed)
Hysterectomy A hysterectomy is a procedure where your womb (uterus) is surgically taken out. It will no longer be possible to have menstrual periods or to become pregnant. Removal of the tubes and ovaries (bilateral salpingo-oopherectomy) can be done during this operation as well.   An abdominal hysterectomy is done through a large cut (incision) in the abdomen made by the surgeon.   A vaginal hysterectomy is done through the vagina. There are no abdominal incisions, but there will be incisions on the inside of the vagina.   A laparoscopic assisted vaginal hysterectomy is done through 2 or 3 small incisions in the abdomen, but the uterus is removed and passed through the vagina.  Women who are going to have a hysterectomy should be tested first to make sure there is no cancer of the cervix or in the uterus. INDICATIONS FOR HYSTERECTOMY:  Persistent abnormal bleeding.   Lasting (chronic) pelvic pain.   Endometriosis. This is when the lining of the uterus (endometrium) is misplaced outside of its normal location.   Adenomyosis. This is when the endometrium tissue grows in the muscle of the uterus.   Uterine prolapse. This is when the uterus falls down into the vagina.   Cancer of the uterus or cervix that requires a radical hysterectomy, removal of the uterus, tubes, ovaries and surrounding lymph nodes.  RISKS  All surgeries can have risks. Some of these risks are:  A lot of bleeding.  Injury to surrounding organs.   Infection.   Blood clots of the leg, heart or lung.  Problems with anesthesia.   Early menopause.   BEFORE SURGERY  Do not take aspirin or blood thinners for a week before surgery, or as directed by your caregiver.   Do not eat or drink anything after midnight the night before surgery, or as directed by your caregiver.   Let your caregiver know if you get a cold or other infectious problems before surgery.   If you are being admitted the day of surgery, you  should be present 1.5 hours before your procedure or as told by your caregiver. Check in at the admissions desk for filling out necessary forms if you are not preregistered. There may be consent forms to sign prior to the procedure. If friends or family come with you, there is a waiting area for them while you are having your procedure.  LET YOUR CAREGIVERS KNOW ABOUT THE FOLLOWING:  Allergies (especially to medicines).  Medications taken including herbs, eye drops, over the counter medications, and creams.   Use of steroids (by mouth or creams).   Past problems with anesthetics or novocaine.   Possibility of pregnancy, if this applies.  History of blood clots (thrombophlebitis).   History of bleeding or blood problems.   Past surgery.   Other health problems.   PROCEDURE  An IV (intravenous) will be placed in your arm. You will be given a drug to make you sleep (anesthetic) during surgery. You may be given a shot in the spine (spinal anesthesia) that will numb your body from the waist down. This will keep you pain free during surgery.   When you wake from surgery, you will have the IV and a long, narrow, hollow tube (Foley catheter) draining the bladder for one or two days after surgery. This will make passing your urine easier. It also helps by keeping your bladder empty during surgery.   After surgery, you will be taken to the recovery area where a nurse will watch and   check your progress. Once you wake up, stable and taking fluids well, without other problems, you will be allowed to return to your room. Usually you will remain in the hospital 3 to 5 days. You may be given a medicine that kills germs (antibiotic) during and after the surgery and when you go home. Pain medication will be ordered by your caregiver while you are in the hospital and when you go home.  HOME CARE INSTRUCTIONS Healing will take time. You will have discomfort, tenderness, swelling and bruising at the operative  site for a couple of weeks. This is normal and will get better as time goes on.   Only take over-the-counter or prescription medicines for pain, discomfort or fever as directed by your caregiver.   Do not take aspirin. It can cause bleeding.   Do not drive when taking pain medication.   Follow your caregiver's advice regarding diet, exercise, lifting, driving and general activities.   Resume your usual diet as directed and allowed.   Get plenty of rest and sleep.   Do not douche, use tampons, or have sexual intercourse until your caregiver gives you permission.   Change your bandages (dressings) as directed.   Take your temperature twice a day. Write it down.   Your caregiver may recommend showers instead of baths for a few weeks.   Do not drink alcohol until your caregiver gives you permission.   If you develop constipation, you may take a mild laxative with your caregiver's permission. Bran foods and drinking fluids helps with constipation problems.   Try to have someone home with you for a week or two to help with the household activities.   Make sure you and your family understands everything about your operation and recovery.   Do not sign any legal documents until you feel normal again.   Keep all your follow-up appointments as recommended by your caregiver.  SEEK MEDICAL CARE IF:  There is swelling, redness or increasing pain in the wound area.   Pus is coming from the wound.   You notice a bad smell from the wound or surgical dressing.   You have pain, redness and swelling from the intravenous site.   The wound is breaking open (the edges are not staying together).   You feel dizzy or feel like fainting.   You develop pain or bleeding when you urinate.   You develop diarrhea.   You develop nausea and vomiting.   You develop abnormal vaginal discharge.   You develop a rash.   You have any type of abnormal reaction or develop an allergy to your  medication.   You need stronger pain medication for your pain.  SEEK IMMEDIATE MEDICAL CARE:  You develop a temperature of 101 or higher.   You develop abdominal pain.   You develop chest pain.   You develop shortness of breath.   You pass out.   You develop pain, swelling or redness of your leg.   You develop heavy vaginal bleeding with or without blood clots.  Document Released: 04/13/2001 Document Re-Released: 11/09/2009 [employer] Patient Information 2011 [employer]. 

## 2011-07-12 NOTE — Progress Notes (Signed)
History 40 year-old  [id number] who has been evaluated for menorrhagia and dysmenorrhea that started in the beginning of June 2012.  She was hospitalized twice for transfusions in July, and has been on Megace 40 mg daily.  She is here today to follow up results of her evaluation; she has no concerns.  She is interested in having a hysterectomy.  Endometrial biopsy (06/03/11) ABUNDANT BLOOD, SCANTY SECRETORY ENDOMETRIUM WITH FOCAL STROMAL BREAKDOWN.  NO ATYPIA, HYPERPLASIA OR MALIGNANCY  Pelvic ultrasound (05/18/11) The uterus is slightly enlarged, measuring 10.9 x 5.8 x 6.3 cm (previously 8.9 x 4.9 x 5.4 cm.) There are multiple, at least four, myometrial fibroids. The largest is at the right fundus, measuring 2.5 cm (previously 1.8 cm.) The endometrial stripe is homogeneous and upper limits of normal for a patient this age, measuring 15 mm. Both ovaries have a normal size and appearance. The right ovary measures 3.9 x 2.8 x 3.0 cm, and the left ovary measures 3.4 x 1.4 x 1.9 cm. There is a small amount of simple free pelvic fluid.  IMPRESSION: Fibroid uterus. The overall size of the uterus and the individual fibroids have increased slightly in size compared with the prior study in 2006.   The following portions of the patient's history were reviewed and updated as appropriate: allergies, current medications, past family history, past medical history, past social history, past surgical history and problem list.  Physical exam Deferred at this visit.  Assessment and Plan Results were discussed with patient. Discussed management options for menorrhagia including oral Provera, Depo Provera, Mirena IUD, endometrial ablation (Novasure/Hydrothermal Ablation/Thermachoice) or hysterectomy as definitive surgical management.  Discussed risks and benefits of each method. Patient desires definitive management with hysterectomy.  Patient has a small uterus, and had a laparotomy for an ectopic pregnancy.  I proposed  doing a laparoscopic-assisted vaginal hysterectomy (LAVH).  Of note, patient has a history of breast cancer (DCIS s/p mastectomy and chemotherapy) but reports testing negative for BRCA mutations, there are no first degree relatives with breast cancer and no family history of ovarian cancer, so there is no clear indication for prophylactic oophorectomy.  Also given that the patient is young, surgical menopause could have major health risks such as loss of ovarian hormones that could lead to vasomotor symptoms and osteoporosis, increased risk of cardiovascular disease, and procedure-related complications.  However, patient was counseled regarding prophylactic bilateral salpingectomy given that a growing body of knowledge reveals that the majority of cases of high grade serous "ovarian" cancer actually are fallopian tube cancers. The precursor lesions are though to arise from the fimbriated end of the fallopian tube and the cancer can have metastases from this primary lesion; and removal of fallopian tubes do not result in any known hormonal imbalance.   The patient agrees with this proposed procedures of laparoscopic-assisted vaginal hysterectomy (LAVH) and bilateral salpingectomy .  The risks of surgery were discussed in detail with the patient including but not limited to: bleeding which may require transfusion or reoperation; infection which may require antibiotics; injury to bowel, bladder, ureters or other surrounding organs; need for additional procedures including laparotomy; thromboembolic phenomenon, incisional problems and other postoperative/anesthesia complications. She was told that she will be contacted by our surgical scheduler regarding the time and date of her surgery; routine preoperative instructions of having nothing to eat or drink after midnight on the day prior to surgery and also coming to the hospital 1 1/2 hours prior to her time of surgery were also emphasized.  She  was told she may be  called for a preoperative appointment about a week prior to surgery and will be given further preoperative instructions at that visit. In the meantime, she will continue her Megace and Ferrous sulfate; bleeding precautions were reviewed.

## 2011-07-16 ENCOUNTER — Other Ambulatory Visit (HOSPITAL_COMMUNITY): Payer: Self-pay | Admitting: Oncology

## 2011-07-16 ENCOUNTER — Encounter (HOSPITAL_BASED_OUTPATIENT_CLINIC_OR_DEPARTMENT_OTHER): Payer: Self-pay | Admitting: Oncology

## 2011-07-16 DIAGNOSIS — C50419 Malignant neoplasm of upper-outer quadrant of unspecified female breast: Secondary | ICD-10-CM

## 2011-07-16 DIAGNOSIS — Z170 Estrogen receptor positive status [ER+]: Secondary | ICD-10-CM

## 2011-07-16 DIAGNOSIS — C50919 Malignant neoplasm of unspecified site of unspecified female breast: Secondary | ICD-10-CM

## 2011-07-16 DIAGNOSIS — IMO0001 Reserved for inherently not codable concepts without codable children: Secondary | ICD-10-CM

## 2011-07-16 LAB — COMPREHENSIVE METABOLIC PANEL
ALT: 10 U/L (ref 0–35)
AST: 17 U/L (ref 0–37)
Albumin: 4.1 g/dL (ref 3.5–5.2)
Alkaline Phosphatase: 81 U/L (ref 39–117)
BUN: 8 mg/dL (ref 6–23)
CO2: 23 mEq/L (ref 19–32)
Calcium: 9.5 mg/dL (ref 8.4–10.5)
Chloride: 104 mEq/L (ref 96–112)
Creatinine, Ser: 1 mg/dL (ref 0.50–1.10)
Glucose, Bld: 89 mg/dL (ref 70–99)
Potassium: 3.5 mEq/L (ref 3.5–5.3)
Sodium: 142 mEq/L (ref 135–145)
Total Bilirubin: 0.3 mg/dL (ref 0.3–1.2)
Total Protein: 7.5 g/dL (ref 6.0–8.3)

## 2011-07-16 LAB — CBC WITH DIFFERENTIAL/PLATELET
BASO%: 0.5 % (ref 0.0–2.0)
Basophils Absolute: 0 10*3/uL (ref 0.0–0.1)
EOS%: 1.1 % (ref 0.0–7.0)
Eosinophils Absolute: 0.1 10*3/uL (ref 0.0–0.5)
HCT: 35 % (ref 34.8–46.6)
HGB: 11.9 g/dL (ref 11.6–15.9)
LYMPH%: 32.7 % (ref 14.0–49.7)
MCH: 30.1 pg (ref 25.1–34.0)
MCHC: 34 g/dL (ref 31.5–36.0)
MCV: 88.6 fL (ref 79.5–101.0)
MONO#: 0.3 10*3/uL (ref 0.1–0.9)
MONO%: 5.6 % (ref 0.0–14.0)
NEUT#: 3.6 10*3/uL (ref 1.5–6.5)
NEUT%: 60.1 % (ref 38.4–76.8)
Platelets: 268 10*3/uL (ref 145–400)
RBC: 3.95 10*6/uL (ref 3.70–5.45)
RDW: 15.6 % — ABNORMAL HIGH (ref 11.2–14.5)
WBC: 6.1 10*3/uL (ref 3.9–10.3)
lymph#: 2 10*3/uL (ref 0.9–3.3)

## 2011-07-16 LAB — LACTATE DEHYDROGENASE: LDH: 168 U/L (ref 94–250)

## 2011-07-27 LAB — CBC
HCT: 35.6 — ABNORMAL LOW
Hemoglobin: 11.9 — ABNORMAL LOW
MCHC: 33.3
MCV: 84.6
Platelets: 268
RBC: 4.21
RDW: 29.7 — ABNORMAL HIGH
WBC: 9

## 2011-07-27 LAB — RAPID STREP SCREEN (MED CTR MEBANE ONLY): Streptococcus, Group A Screen (Direct): NEGATIVE

## 2011-07-27 LAB — PREGNANCY, URINE: Preg Test, Ur: NEGATIVE

## 2011-07-29 ENCOUNTER — Ambulatory Visit (INDEPENDENT_AMBULATORY_CARE_PROVIDER_SITE_OTHER): Payer: Self-pay | Admitting: Family Medicine

## 2011-07-29 ENCOUNTER — Encounter: Payer: Self-pay | Admitting: Family Medicine

## 2011-07-29 DIAGNOSIS — M797 Fibromyalgia: Secondary | ICD-10-CM

## 2011-07-29 DIAGNOSIS — G43909 Migraine, unspecified, not intractable, without status migrainosus: Secondary | ICD-10-CM

## 2011-07-29 DIAGNOSIS — N920 Excessive and frequent menstruation with regular cycle: Secondary | ICD-10-CM

## 2011-07-29 DIAGNOSIS — I10 Essential (primary) hypertension: Secondary | ICD-10-CM

## 2011-07-29 DIAGNOSIS — IMO0001 Reserved for inherently not codable concepts without codable children: Secondary | ICD-10-CM

## 2011-07-29 MED ORDER — MEGESTROL ACETATE 20 MG PO TABS: 40.0000 mg | ORAL_TABLET | Freq: Every day | ORAL | Status: AC

## 2011-07-29 MED ORDER — MEGESTROL ACETATE 40 MG PO TABS: 40.0000 mg | ORAL_TABLET | Freq: Every day | ORAL | Status: DC

## 2011-07-29 MED ORDER — AMITRIPTYLINE HCL 50 MG PO TABS: 50.0000 mg | ORAL_TABLET | Freq: Every day | ORAL | Status: DC

## 2011-07-29 MED ORDER — GABAPENTIN 300 MG PO CAPS: 300.0000 mg | ORAL_CAPSULE | Freq: Every day | ORAL | Status: DC

## 2011-07-29 NOTE — Patient Instructions (Signed)
For the headaches, please increase the amitriptyline to 50mg  each night If you arent feeling too much daytime sleepiness, increase it to 100mg  after 1 week  Restart gabapentin at night.  Once we are happy with the amitriptyline dose, we can up the gabapentin  I have refilled your megace

## 2011-07-29 NOTE — Assessment & Plan Note (Signed)
Worse now that she has stopped gabapentin.  Will restart gabapentin at 300 qhs and will hold there until elavil titrated.

## 2011-07-29 NOTE — Progress Notes (Signed)
  Subjective:    Patient ID: Victoria Delacruz, female    DOB: 1971/08/30, 40 y.o.   MRN: [id number]  HPI  HTN- taking HCTZ, no problems, no CP. Does not check BP at home  Fibromyalgia- not taking gabapentin because she was worried it would be bad with elavil.  She felt better when she was on it and now has increased stiffness in hands and more pain, especially with sitting too long.  No exercise.  Migraines- has some type of HA 4x/week.  Takes motrin.  No vision changes or weakness.  No change in HAs  Menorrhagia- has stayed on megace.  Not currently bleeding.  Concerned about increased appetite and weight gain.  Review of Systems    no N/V/D or CP  Objective:   Physical Exam  Vital signs reviewed General appearance - alert, well appearing, and in no distress and oriented to person, place, and time Heart - normal rate, regular rhythm, normal S1, S2, no murmurs, rubs, clicks or gallops Chest - clear to auscultation, no wheezes, rales or rhonchi, symmetric air entry, no tachypnea, retractions or cyanosis Hands- mildly swollen fingers, normal sensation and movement, no joint effusions or synovitiis Neurological - alert, oriented, normal speech, no focal findings or movement disorder noted, screening mental status exam normal, neck supple without rigidity, cranial nerves II through XII intact       Assessment & Plan:  Fibromyalgia Worse now that she has stopped gabapentin.  Will restart gabapentin at 300 qhs and will hold there until elavil titrated.  Hypertension At goal, continue HCTZ  Menorrhagia Bleeding has stopped on megace.  Will refill megace.  Reviewed GYN note, they recommend continuing until hysterectomy in Oct  MIGRAINE HEADACHE No improvement on small dose of elavil but no fatigue either.  Will increase to 50 then 100 as tolerated.  Plan to go up to 150 and then assess if helping headaches and chronic pain.  Could switch to a B blocker since HTN if necessary.

## 2011-07-29 NOTE — Assessment & Plan Note (Signed)
No improvement on small dose of elavil but no fatigue either.  Will increase to 50 then 100 as tolerated.  Plan to go up to 150 and then assess if helping headaches and chronic pain.  Could switch to a B blocker since HTN if necessary.

## 2011-07-29 NOTE — Assessment & Plan Note (Signed)
At goal, continue HCTZ

## 2011-07-29 NOTE — Assessment & Plan Note (Signed)
Bleeding has stopped on megace.  Will refill megace.  Reviewed GYN note, they recommend continuing until hysterectomy in Oct

## 2011-08-12 NOTE — Telephone Encounter (Signed)
Pt has been seen for this and this is taken care of

## 2011-08-17 NOTE — Patient Instructions (Addendum)
   Your procedure is scheduled on: Monday October 29th  Enter through the Main Entrance of [facility] at: 8:30 am Pick up the phone at the desk and dial [phone] and inform us of your arrival.  Please call this number if you have any problems the morning of surgery: [phone]  Remember: Do not eat food after midnight:Sunday Do not drink clear liquids after:midnight Sunday Take these medicines the morning of surgery with a SIP OF WATER:blood pressure medicine  Do not wear jewelry, make-up, or FINGER nail polish Do not wear lotions, powders, or perfumes.  Do not shave 48 hours prior to surgery. Do not bring valuables to the hospital.  Leave suitcase in the car. After Surgery it may be brought to your room. For patients being admitted to the hospital, checkout time is 11:00am the day of discharge.     Remember to use your hibiclens as instructed.Please shower with 1/2 bottle the evening before your surgery and the other 1/2 bottle the morning of surgery.

## 2011-08-23 ENCOUNTER — Encounter (HOSPITAL_BASED_OUTPATIENT_CLINIC_OR_DEPARTMENT_OTHER): Payer: Self-pay | Admitting: Oncology

## 2011-08-23 ENCOUNTER — Other Ambulatory Visit (HOSPITAL_COMMUNITY): Payer: Self-pay | Admitting: Oncology

## 2011-08-23 ENCOUNTER — Encounter (HOSPITAL_COMMUNITY)
Admission: RE | Admit: 2011-08-23 | Discharge: 2011-08-23 | Disposition: A | Discharge status: Home / Self Care | Payer: Self-pay | Source: Ambulatory Visit | Attending: Obstetrics & Gynecology | Admitting: Obstetrics & Gynecology

## 2011-08-23 ENCOUNTER — Encounter (HOSPITAL_COMMUNITY): Payer: Self-pay

## 2011-08-23 DIAGNOSIS — N6459 Other signs and symptoms in breast: Secondary | ICD-10-CM

## 2011-08-23 DIAGNOSIS — IMO0001 Reserved for inherently not codable concepts without codable children: Secondary | ICD-10-CM

## 2011-08-23 DIAGNOSIS — C50419 Malignant neoplasm of upper-outer quadrant of unspecified female breast: Secondary | ICD-10-CM

## 2011-08-23 DIAGNOSIS — Z9010 Acquired absence of unspecified breast and nipple: Secondary | ICD-10-CM

## 2011-08-23 DIAGNOSIS — Z170 Estrogen receptor positive status [ER+]: Secondary | ICD-10-CM

## 2011-08-23 HISTORY — DX: Reserved for inherently not codable concepts without codable children: IMO0001

## 2011-08-23 HISTORY — DX: Depression, unspecified: F32.A

## 2011-08-23 HISTORY — DX: Major depressive disorder, single episode, unspecified: F32.9

## 2011-08-23 HISTORY — DX: Encounter for other specified aftercare: Z51.89

## 2011-08-23 LAB — CBC WITH DIFFERENTIAL/PLATELET
BASO%: 0.7 % (ref 0.0–2.0)
Basophils Absolute: 0 10*3/uL (ref 0.0–0.1)
EOS%: 2 % (ref 0.0–7.0)
Eosinophils Absolute: 0.1 10*3/uL (ref 0.0–0.5)
HCT: 35.5 % (ref 34.8–46.6)
HGB: 12.1 g/dL (ref 11.6–15.9)
LYMPH%: 31.2 % (ref 14.0–49.7)
MCH: 30 pg (ref 25.1–34.0)
MCHC: 34 g/dL (ref 31.5–36.0)
MCV: 88.2 fL (ref 79.5–101.0)
MONO#: 0.3 10*3/uL (ref 0.1–0.9)
MONO%: 5.3 % (ref 0.0–14.0)
NEUT#: 3.3 10*3/uL (ref 1.5–6.5)
NEUT%: 60.8 % (ref 38.4–76.8)
Platelets: 257 10*3/uL (ref 145–400)
RBC: 4.03 10*6/uL (ref 3.70–5.45)
RDW: 15.8 % — ABNORMAL HIGH (ref 11.2–14.5)
WBC: 5.4 10*3/uL (ref 3.9–10.3)
lymph#: 1.7 10*3/uL (ref 0.9–3.3)

## 2011-08-23 LAB — COMPREHENSIVE METABOLIC PANEL
ALT: 10 U/L (ref 0–35)
AST: 22 U/L (ref 0–37)
Albumin: 4.2 g/dL (ref 3.5–5.2)
Alkaline Phosphatase: 77 U/L (ref 39–117)
BUN: 11 mg/dL (ref 6–23)
CO2: 24 mEq/L (ref 19–32)
Calcium: 9.1 mg/dL (ref 8.4–10.5)
Chloride: 106 mEq/L (ref 96–112)
Creatinine, Ser: 0.92 mg/dL (ref 0.50–1.10)
Glucose, Bld: 115 mg/dL — ABNORMAL HIGH (ref 70–99)
Potassium: 3.4 mEq/L — ABNORMAL LOW (ref 3.5–5.3)
Sodium: 140 mEq/L (ref 135–145)
Total Bilirubin: 0.4 mg/dL (ref 0.3–1.2)
Total Protein: 7.8 g/dL (ref 6.0–8.3)

## 2011-08-23 LAB — LACTATE DEHYDROGENASE: LDH: 184 U/L (ref 94–250)

## 2011-08-23 LAB — SURGICAL PCR SCREEN
MRSA, PCR: NEGATIVE
Staphylococcus aureus: NEGATIVE

## 2011-08-23 NOTE — Pre-Procedure Instructions (Signed)
Cbc, cmet drawn today [facility]23/11 [facility]-will not draw at [facility]

## 2011-08-29 MED ORDER — DEXTROSE 5 % IV SOLN
2.0000 g | INTRAVENOUS | Status: AC
  Administered 2011-08-30: 2 g via INTRAVENOUS
  Filled 2011-08-29: qty 2

## 2011-08-30 ENCOUNTER — Inpatient Hospital Stay (HOSPITAL_COMMUNITY): Payer: Self-pay | Admitting: Anesthesiology

## 2011-08-30 ENCOUNTER — Other Ambulatory Visit: Payer: Self-pay | Admitting: Obstetrics & Gynecology

## 2011-08-30 ENCOUNTER — Encounter (HOSPITAL_COMMUNITY): Payer: Self-pay | Admitting: *Deleted

## 2011-08-30 ENCOUNTER — Inpatient Hospital Stay (HOSPITAL_COMMUNITY)
Admission: RE | Admit: 2011-08-30 | Discharge: 2011-09-01 | DRG: 743 | Disposition: A | Discharge status: Home / Self Care | Payer: Self-pay | Source: Ambulatory Visit | Attending: Obstetrics & Gynecology | Admitting: Obstetrics & Gynecology

## 2011-08-30 ENCOUNTER — Encounter (HOSPITAL_COMMUNITY)
Admission: RE | Disposition: A | Discharge status: Home / Self Care | Payer: Self-pay | Source: Ambulatory Visit | Attending: Obstetrics & Gynecology

## 2011-08-30 ENCOUNTER — Encounter (HOSPITAL_COMMUNITY): Payer: Self-pay | Admitting: Anesthesiology

## 2011-08-30 DIAGNOSIS — N920 Excessive and frequent menstruation with regular cycle: Principal | ICD-10-CM | POA: Diagnosis present

## 2011-08-30 DIAGNOSIS — G43909 Migraine, unspecified, not intractable, without status migrainosus: Secondary | ICD-10-CM

## 2011-08-30 DIAGNOSIS — D251 Intramural leiomyoma of uterus: Secondary | ICD-10-CM | POA: Diagnosis present

## 2011-08-30 DIAGNOSIS — D252 Subserosal leiomyoma of uterus: Secondary | ICD-10-CM | POA: Diagnosis present

## 2011-08-30 DIAGNOSIS — Z01812 Encounter for preprocedural laboratory examination: Secondary | ICD-10-CM

## 2011-08-30 DIAGNOSIS — D259 Leiomyoma of uterus, unspecified: Secondary | ICD-10-CM

## 2011-08-30 DIAGNOSIS — M797 Fibromyalgia: Secondary | ICD-10-CM

## 2011-08-30 DIAGNOSIS — D250 Submucous leiomyoma of uterus: Secondary | ICD-10-CM | POA: Diagnosis present

## 2011-08-30 DIAGNOSIS — N946 Dysmenorrhea, unspecified: Secondary | ICD-10-CM | POA: Diagnosis present

## 2011-08-30 DIAGNOSIS — Z01818 Encounter for other preprocedural examination: Secondary | ICD-10-CM

## 2011-08-30 HISTORY — PX: LAPAROSCOPIC ASSISTED VAGINAL HYSTERECTOMY: SHX5398

## 2011-08-30 LAB — PREGNANCY, URINE: Preg Test, Ur: NEGATIVE

## 2011-08-30 SURGERY — HYSTERECTOMY, VAGINAL, LAPAROSCOPY-ASSISTED
Anesthesia: General | Site: Abdomen | Wound class: Clean Contaminated

## 2011-08-30 MED ORDER — MENTHOL 3 MG MT LOZG: 1.0000 | LOZENGE | OROMUCOSAL | Status: DC | PRN

## 2011-08-30 MED ORDER — LACTATED RINGERS IR SOLN
Status: DC | PRN
  Administered 2011-08-30: 3000 mL

## 2011-08-30 MED ORDER — GLYCOPYRROLATE 0.2 MG/ML IJ SOLN
INTRAMUSCULAR | Status: DC | PRN
  Administered 2011-08-30: .4 mg via INTRAVENOUS

## 2011-08-30 MED ORDER — KETOROLAC TROMETHAMINE 30 MG/ML IJ SOLN
INTRAMUSCULAR | Status: DC | PRN
  Administered 2011-08-30 (×2): 30 mg via INTRAVENOUS

## 2011-08-30 MED ORDER — MAGNESIUM CITRATE PO SOLN
296.0000 mL | Freq: Every day | ORAL | Status: DC | PRN
  Filled 2011-08-30: qty 296

## 2011-08-30 MED ORDER — ONDANSETRON HCL 4 MG/2ML IJ SOLN
INTRAMUSCULAR | Status: AC
  Filled 2011-08-30: qty 2

## 2011-08-30 MED ORDER — LIDOCAINE HCL (CARDIAC) 20 MG/ML IV SOLN
INTRAVENOUS | Status: AC
  Filled 2011-08-30: qty 5

## 2011-08-30 MED ORDER — DEXAMETHASONE SODIUM PHOSPHATE 4 MG/ML IJ SOLN
INTRAMUSCULAR | Status: DC | PRN
  Administered 2011-08-30: 10 mg via INTRAVENOUS

## 2011-08-30 MED ORDER — MAGNESIUM HYDROXIDE 400 MG/5ML PO SUSP: 30.0000 mL | Freq: Every day | ORAL | Status: DC | PRN

## 2011-08-30 MED ORDER — KETOROLAC TROMETHAMINE 60 MG/2ML IM SOLN
INTRAMUSCULAR | Status: AC
  Filled 2011-08-30: qty 2

## 2011-08-30 MED ORDER — KETOROLAC TROMETHAMINE 30 MG/ML IJ SOLN: 30.0000 mg | Freq: Once | INTRAMUSCULAR | Status: DC

## 2011-08-30 MED ORDER — HETASTARCH-ELECTROLYTES 6 % IV SOLN
INTRAVENOUS | Status: AC
  Filled 2011-08-30: qty 500

## 2011-08-30 MED ORDER — PROPOFOL 10 MG/ML IV EMUL
INTRAVENOUS | Status: AC
  Filled 2011-08-30: qty 20

## 2011-08-30 MED ORDER — FENTANYL CITRATE 0.05 MG/ML IJ SOLN
INTRAMUSCULAR | Status: AC
  Filled 2011-08-30: qty 5

## 2011-08-30 MED ORDER — ROCURONIUM BROMIDE 50 MG/5ML IV SOLN
INTRAVENOUS | Status: AC
  Filled 2011-08-30: qty 1

## 2011-08-30 MED ORDER — ROCURONIUM BROMIDE 100 MG/10ML IV SOLN
INTRAVENOUS | Status: DC | PRN
  Administered 2011-08-30: 10 mg via INTRAVENOUS
  Administered 2011-08-30: 45 mg via INTRAVENOUS
  Administered 2011-08-30: 5 mg via INTRAVENOUS

## 2011-08-30 MED ORDER — HETASTARCH-ELECTROLYTES 6 % IV SOLN
INTRAVENOUS | Status: DC | PRN
  Administered 2011-08-30: 12:00:00 via INTRAVENOUS

## 2011-08-30 MED ORDER — ZOLPIDEM TARTRATE 5 MG PO TABS: 5.0000 mg | ORAL_TABLET | Freq: Every evening | ORAL | Status: DC | PRN

## 2011-08-30 MED ORDER — ALUM & MAG HYDROXIDE-SIMETH 200-200-20 MG/5ML PO SUSP
30.0000 mL | ORAL | Status: DC | PRN
  Administered 2011-08-30: 30 mL via ORAL
  Filled 2011-08-30: qty 30

## 2011-08-30 MED ORDER — GLYCOPYRROLATE 0.2 MG/ML IJ SOLN
INTRAMUSCULAR | Status: AC
  Filled 2011-08-30: qty 1

## 2011-08-30 MED ORDER — ONDANSETRON HCL 4 MG PO TABS: 4.0000 mg | ORAL_TABLET | Freq: Four times a day (QID) | ORAL | Status: DC | PRN

## 2011-08-30 MED ORDER — LIDOCAINE HCL (CARDIAC) 20 MG/ML IV SOLN
INTRAVENOUS | Status: DC | PRN
  Administered 2011-08-30: 100 mg via INTRAVENOUS

## 2011-08-30 MED ORDER — HYDROCHLOROTHIAZIDE 12.5 MG PO CAPS
12.5000 mg | ORAL_CAPSULE | Freq: Every day | ORAL | Status: DC
  Administered 2011-08-31 – 2011-09-01 (×2): 12.5 mg via ORAL
  Filled 2011-08-30 (×4): qty 1

## 2011-08-30 MED ORDER — PROPOFOL 10 MG/ML IV EMUL
INTRAVENOUS | Status: DC | PRN
  Administered 2011-08-30: 200 mg via INTRAVENOUS

## 2011-08-30 MED ORDER — MIDAZOLAM HCL 2 MG/2ML IJ SOLN
INTRAMUSCULAR | Status: AC
  Filled 2011-08-30: qty 2

## 2011-08-30 MED ORDER — IBUPROFEN 600 MG PO TABS
600.0000 mg | ORAL_TABLET | Freq: Four times a day (QID) | ORAL | Status: DC | PRN
  Administered 2011-08-31 (×2): 600 mg via ORAL
  Filled 2011-08-30 (×2): qty 1

## 2011-08-30 MED ORDER — SIMETHICONE 80 MG PO CHEW
80.0000 mg | CHEWABLE_TABLET | Freq: Four times a day (QID) | ORAL | Status: DC | PRN
  Administered 2011-08-30 – 2011-08-31 (×3): 80 mg via ORAL

## 2011-08-30 MED ORDER — PANTOPRAZOLE SODIUM 40 MG PO TBEC
40.0000 mg | DELAYED_RELEASE_TABLET | Freq: Every day | ORAL | Status: DC
  Administered 2011-08-31: 40 mg via ORAL
  Filled 2011-08-30 (×3): qty 1

## 2011-08-30 MED ORDER — BUPIVACAINE-EPINEPHRINE 0.5% -1:200000 IJ SOLN
INTRAMUSCULAR | Status: DC | PRN
  Administered 2011-08-30: 30 mL

## 2011-08-30 MED ORDER — LACTATED RINGERS IV SOLN
INTRAVENOUS | Status: DC
  Administered 2011-08-30 (×6): via INTRAVENOUS

## 2011-08-30 MED ORDER — NEOSTIGMINE METHYLSULFATE 1 MG/ML IJ SOLN
INTRAMUSCULAR | Status: AC
  Filled 2011-08-30: qty 10

## 2011-08-30 MED ORDER — BUPIVACAINE HCL (PF) 0.25 % IJ SOLN
INTRAMUSCULAR | Status: DC | PRN
  Administered 2011-08-30: 30 mL

## 2011-08-30 MED ORDER — LACTATED RINGERS IV SOLN
INTRAVENOUS | Status: DC
  Administered 2011-08-30 – 2011-08-31 (×3): via INTRAVENOUS

## 2011-08-30 MED ORDER — HYDROMORPHONE HCL 1 MG/ML IJ SOLN: 0.2000 mg | INTRAMUSCULAR | Status: DC | PRN

## 2011-08-30 MED ORDER — SODIUM CHLORIDE BACTERIOSTATIC 0.9 % IJ SOLN
INTRAMUSCULAR | Status: DC | PRN
  Administered 2011-08-30: 30 mL

## 2011-08-30 MED ORDER — DEXAMETHASONE SODIUM PHOSPHATE 10 MG/ML IJ SOLN
INTRAMUSCULAR | Status: AC
  Filled 2011-08-30: qty 1

## 2011-08-30 MED ORDER — ONDANSETRON HCL 4 MG/2ML IJ SOLN
INTRAMUSCULAR | Status: DC | PRN
  Administered 2011-08-30: 4 mg via INTRAVENOUS

## 2011-08-30 MED ORDER — MIDAZOLAM HCL 5 MG/5ML IJ SOLN
INTRAMUSCULAR | Status: DC | PRN
  Administered 2011-08-30: 2 mg via INTRAVENOUS

## 2011-08-30 MED ORDER — FENTANYL CITRATE 0.05 MG/ML IJ SOLN
INTRAMUSCULAR | Status: DC | PRN
  Administered 2011-08-30 (×3): 100 ug via INTRAVENOUS
  Administered 2011-08-30 (×2): 50 ug via INTRAVENOUS

## 2011-08-30 MED ORDER — PHENYLEPHRINE HCL 10 MG/ML IJ SOLN
INTRAMUSCULAR | Status: DC | PRN
  Administered 2011-08-30: 80 mg via INTRAVENOUS
  Administered 2011-08-30: 40 mg via INTRAVENOUS
  Administered 2011-08-30: 80 mg via INTRAVENOUS

## 2011-08-30 MED ORDER — SENNOSIDES-DOCUSATE SODIUM 8.6-50 MG PO TABS: 2.0000 | ORAL_TABLET | Freq: Every day | ORAL | Status: DC | PRN

## 2011-08-30 MED ORDER — KETOROLAC TROMETHAMINE 30 MG/ML IJ SOLN
INTRAMUSCULAR | Status: AC
  Filled 2011-08-30: qty 1

## 2011-08-30 MED ORDER — AMITRIPTYLINE HCL 50 MG PO TABS
50.0000 mg | ORAL_TABLET | Freq: Two times a day (BID) | ORAL | Status: DC
  Administered 2011-08-30 – 2011-09-01 (×3): 50 mg via ORAL
  Filled 2011-08-30 (×4): qty 1

## 2011-08-30 MED ORDER — DOCUSATE SODIUM 100 MG PO CAPS
100.0000 mg | ORAL_CAPSULE | Freq: Two times a day (BID) | ORAL | Status: DC
  Administered 2011-08-30 – 2011-09-01 (×4): 100 mg via ORAL
  Filled 2011-08-30 (×4): qty 1

## 2011-08-30 MED ORDER — ONDANSETRON HCL 4 MG/2ML IJ SOLN: 4.0000 mg | Freq: Four times a day (QID) | INTRAMUSCULAR | Status: DC | PRN

## 2011-08-30 MED ORDER — NEOSTIGMINE METHYLSULFATE 1 MG/ML IJ SOLN
INTRAMUSCULAR | Status: DC | PRN
  Administered 2011-08-30: 2 mg via INTRAVENOUS

## 2011-08-30 MED ORDER — OXYCODONE-ACETAMINOPHEN 5-325 MG PO TABS
1.0000 | ORAL_TABLET | ORAL | Status: DC | PRN
Start: 2011-08-30 — End: 2011-09-01
  Administered 2011-08-31: 1 via ORAL
  Filled 2011-08-30 (×2): qty 1

## 2011-08-30 MED ORDER — BISACODYL 10 MG RE SUPP: 10.0000 mg | Freq: Every day | RECTAL | Status: DC | PRN

## 2011-08-30 MED ORDER — GABAPENTIN 300 MG PO CAPS
300.0000 mg | ORAL_CAPSULE | Freq: Every day | ORAL | Status: DC
  Administered 2011-08-31: 300 mg via ORAL
  Filled 2011-08-30 (×2): qty 1

## 2011-08-30 MED ORDER — FERROUS SULFATE 325 (65 FE) MG PO TABS
325.0000 mg | ORAL_TABLET | Freq: Three times a day (TID) | ORAL | Status: DC
  Administered 2011-08-31 – 2011-09-01 (×3): 325 mg via ORAL
  Filled 2011-08-30 (×3): qty 1

## 2011-08-30 SURGICAL SUPPLY — 48 items
ADH SKN CLS APL DERMABOND .7 (GAUZE/BANDAGES/DRESSINGS) ×3
APPLICATOR COTTON TIP 6IN STRL (MISCELLANEOUS) ×4 IMPLANT
BAG SPEC RTRVL LRG 6X4 10 (ENDOMECHANICALS) ×3
CABLE HIGH FREQUENCY MONO STRZ (ELECTRODE) IMPLANT
CANISTER SUCTION 2500CC (MISCELLANEOUS) ×4 IMPLANT
CHLORAPREP W/TINT 26ML (MISCELLANEOUS) ×4 IMPLANT
CLOTH BEACON ORANGE TIMEOUT ST (SAFETY) ×4 IMPLANT
CONT PATH 16OZ SNAP LID 3702 (MISCELLANEOUS) ×4 IMPLANT
COUNTER NEEDLE 1200 MAGNETIC (NEEDLE) IMPLANT
COVER TABLE BACK 60X90 (DRAPES) ×4 IMPLANT
DECANTER SPIKE VIAL GLASS SM (MISCELLANEOUS) ×8 IMPLANT
DERMABOND ADVANCED (GAUZE/BANDAGES/DRESSINGS) ×1
DERMABOND ADVANCED .7 DNX12 (GAUZE/BANDAGES/DRESSINGS) ×1 IMPLANT
DRAPE PROXIMA HALF (DRAPES) ×2 IMPLANT
DRAPE UTILITY XL STRL (DRAPES) ×4 IMPLANT
ELECT REM PT RETURN 9FT ADLT (ELECTROSURGICAL) ×4
ELECTRODE REM PT RTRN 9FT ADLT (ELECTROSURGICAL) ×1 IMPLANT
EVACUATOR SMOKE 8.L (FILTER) ×4 IMPLANT
GLOVE BIO SURGEON STRL SZ7 (GLOVE) ×8 IMPLANT
GLOVE BIOGEL PI IND STRL 6.5 (GLOVE) ×4 IMPLANT
GLOVE BIOGEL PI IND STRL 7.0 (GLOVE) ×7 IMPLANT
GLOVE BIOGEL PI INDICATOR 6.5 (GLOVE) ×2
GLOVE BIOGEL PI INDICATOR 7.0 (GLOVE) ×3
GOWN PREVENTION PLUS LG XLONG (DISPOSABLE) ×12 IMPLANT
GOWN STRL REIN XL XLG (GOWN DISPOSABLE) ×4 IMPLANT
NDL INSUFFLATION 14GA 120MM (NEEDLE) ×2 IMPLANT
NEEDLE HYPO 22GX1.5 SAFETY (NEEDLE) IMPLANT
NEEDLE INSUFFLATION 14GA 120MM (NEEDLE) ×4 IMPLANT
NEEDLE MAYO .5 CIRCLE (NEEDLE) ×4 IMPLANT
NS IRRIG 1000ML POUR BTL (IV SOLUTION) ×4 IMPLANT
PACK LAVH (CUSTOM PROCEDURE TRAY) ×4 IMPLANT
POUCH SPECIMEN RETRIEVAL 10MM (ENDOMECHANICALS) ×2 IMPLANT
SEALER TISSUE G2 CVD JAW 35 (ENDOMECHANICALS) IMPLANT
SEALER TISSUE G2 CVD JAW 45CM (ENDOMECHANICALS) ×2 IMPLANT
SET IRRIG TUBING LAPAROSCOPIC (IRRIGATION / IRRIGATOR) ×2 IMPLANT
SUT VIC AB 0 CT1 18XCR BRD8 (SUTURE) ×8 IMPLANT
SUT VIC AB 0 CT1 36 (SUTURE) ×4 IMPLANT
SUT VIC AB 0 CT1 8-18 (SUTURE) ×8
SUT VICRYL 0 ENDOLOOP (SUTURE) IMPLANT
SUT VICRYL 0 TIES 12 18 (SUTURE) ×4 IMPLANT
SUT VICRYL 0 UR6 27IN ABS (SUTURE) ×8 IMPLANT
SUT VICRYL 4-0 PS2 18IN ABS (SUTURE) ×6 IMPLANT
TOWEL OR 17X24 6PK STRL BLUE (TOWEL DISPOSABLE) ×12 IMPLANT
TRAY FOLEY CATH 14FR (SET/KITS/TRAYS/PACK) ×4 IMPLANT
TROCAR Z-THREAD BLADED 11X100M (TROCAR) ×4 IMPLANT
TROCAR Z-THREAD BLADED 5X100MM (TROCAR) ×8 IMPLANT
WARMER LAPAROSCOPE (MISCELLANEOUS) ×4 IMPLANT
WATER STERILE IRR 1000ML POUR (IV SOLUTION) ×4 IMPLANT

## 2011-08-30 NOTE — Transfer of Care (Signed)
Immediate Anesthesia Transfer of Care Note  Patient: Victoria Delacruz  Procedure(s) Performed:  LAPAROSCOPIC ASSISTED VAGINAL HYSTERECTOMY; BILATERAL SALPINGECTOMY  Patient Location: PACU  Anesthesia Type: General  Level of Consciousness: awake, alert , oriented and patient cooperative  Airway & Oxygen Therapy: Patient Spontanous Breathing and Patient connected to nasal cannula oxygen  Post-op Assessment: Report given to PACU RN and Post -op Vital signs reviewed and stable  Post vital signs: Reviewed and stable  Complications: No apparent anesthesia complications

## 2011-08-30 NOTE — Anesthesia Preprocedure Evaluation (Signed)
Anesthesia Evaluation  Patient identified by MRN, date of birth, ID band Patient awake  General Assessment Comment  Reviewed: Allergy & Precautions, H&P , NPO status , Patient's Chart, lab work & pertinent test results  Airway Mallampati: II TM Distance: >3 FB Neck ROM: Full    Dental No notable dental hx. (+) Teeth Intact   Pulmonary  clear to auscultation  Pulmonary exam normal       Cardiovascular hypertension, Pt. on medications Regular Normal    Neuro/Psych  Headaches, PSYCHIATRIC DISORDERS Depression  Neuromuscular disease Negative Neurological ROS     GI/Hepatic negative GI ROS Neg liver ROS    Endo/Other  Negative Endocrine ROS  Renal/GU negative Renal ROS  Genitourinary negative   Musculoskeletal  (+) Fibromyalgia -  Abdominal   Peds  Hematology Fe Deficiency Anemia   Anesthesia Other Findings   Reproductive/Obstetrics negative OB ROS                           Anesthesia Physical Anesthesia Plan  ASA: II  Anesthesia Plan: General   Post-op Pain Management:    Induction: Intravenous  Airway Management Planned: Oral ETT  Additional Equipment:   Intra-op Plan:   Post-operative Plan: Extubation in OR  Informed Consent: I have reviewed the patients History and Physical, chart, labs and discussed the procedure including the risks, benefits and alternatives for the proposed anesthesia with the patient or authorized representative who has indicated his/her understanding and acceptance.   Dental advisory given  Plan Discussed with: CRNA, Anesthesiologist and Surgeon  Anesthesia Plan Comments:         Anesthesia Quick Evaluation

## 2011-08-30 NOTE — Op Note (Signed)
Victoria Delacruz PROCEDURE DATE: 08/30/2011  PREOPERATIVE DIAGNOSIS: Menorrhagia, fibroids POSTOPERATIVE DIAGNOSIS: The same PROCEDURE: Laparoscopic assisted vaginal hysterectomy, bilateral salpingectomy SURGEON:  Dr. Jaynie Collins ASSISTANT: Dr. Nicholaus Bloom  INDICATIONS: 40 y.o. G2P0020  with history of menorrhagia and fibroids desiring definitive surgical management.  Risks of surgery were discussed with the patient including but not limited to: bleeding which may require transfusion or reoperation; infection which may require antibiotics; injury to bowel, bladder, ureters or other surrounding organs; need for additional procedures including laparotomy; thromboembolic phenomenon, incisional problems and other postoperative/anesthesia complications. Written informed consent was obtained.    FINDINGS:  12 week sized fibroid uterus, normal adnexa bilaterally. Mild adhesions in bilateral adnexal area which were lysed.  No evidence of endometriosis or any other abdominal/pelvic abnormality.  Normal upper abdomen.  ANESTHESIA:    General INTRAVENOUS FLUIDS:3000 ml; 500 ml of Hetastarch ESTIMATED BLOOD LOSS: 600 ml URINE OUTPUT: 200 ml SPECIMENS: Uterus, cervix, bilateral fallopian tubes COMPLICATIONS: None immediate  PROCEDURE IN DETAIL:  The patient received intravenous antibiotics and had sequential compression devices applied to her lower extremities while in the preoperative area.  She was then taken to the operating room where general anesthesia was administered and was found to be adequate.  She was placed in the dorsal lithotomy position, and was prepped and draped in a sterile manner.  A Foley catheter was inserted into her bladder and attached to constant drainage and a uterine manipulator was then advanced into the uterus .  After an adequate timeout was performed, attention was then turned to the patient's abdomen where a 10-mm skin incision was made on the umbilical fold.  The Veress  needle was carefully introduced into the peritoneal cavity through the abdominal wall.  Intraperitoneal placement was confirmed by drop in intraabdominal pressure with insufflation of carbon dioxide gas.  Adequate pneumoperitoneum was obtained, and the 10-mm trocar and sleeve were then advanced without difficulty into the abdomen where intraabdominal placement was confirmed by the laparoscope. A survey of the patient's pelvis and abdomen revealed entirely normal anatomy.   Bilateral lower quadrant ports (5 mm and 10 mm) were then placed under direct visualization.  The pelvis was then carefully examined.   On the right side, the round ligament was then clamped and transected with the Enseal.  The uteroovarian ligament was also clamped and transected, and the fallopian tube was freed from the underlying mesosalpinx.  The broad ligament was serially clamped and transected, and a bladder flap was created. These procedures were then repeated on the left side.  Both fallopian tubes were then removed via the 10 mm port.  The ureters were noted to be safely away from the area of dissection. The bladder was then bluntly dissected off the lower uterine segment.  A small amount of bleeding was noted, and the decision was made to leave the trocars in place and proceed with completing the hysterectomy via the vaginal route.  Attention was then turned to her pelvis.  A weighted speculum was then placed in the vagina, and the anterior and posterior lips of the cervix were grasped bilaterally with tenaculums.  The cervix was then injected circumferentially with 0.5% Marcaine with epinephrine solution to maintain hemostasis.  The cervix was then circumferentially incised, and the anterior cul-de-sac was entered sharply and a retractor was placed.  The same procedure was performed posteriorly and the posterior cul-de-sac was entered sharply without difficulty.  A long weighted speculum was inserted into the posterior cul-de-sac.   The  Heaney clamp was then used to clamp the uterosacral ligaments on either side.  They were then cut and sutured ligated with 0 Vicryl, and were held with a tag for later identification. Of note, all sutures used in this case were 0 Vicryl unless otherwise noted.   The cardinal ligaments were then clamped, cut and ligated bilaterally.   The uterine vessels were then clamped, cut and ligated bilaterally.  The uterus was then morcellated by bivalving it and removing parts of the fibroids to aid in the delivery.  After completion of the hysterectomy, all pedicles from the uterosacral ligament to the cornua were examined hemostasis was confirmed.  The vaginal cuff was then closed in a running locked fashion with 0 Vicryl with care given to incorporate the uterosacral pedicles bilaterally.  All instruments were then removed from the pelvis.  Attention was then returned to her abdomen which was insufflated again with carbon dioxide gas.  The laparoscope was used to survey the operative site, and there was a significant amount of clotted blood noted.  All pedicles were reevaluated after copious irrigation, and no active bleeding was noted.  The bladder was also evaluated and no intraperitoneal leakage was seen.  No intraoperative injury to other surrounding organs was noted. The increased blood loss was attributed to back bleeding and trauma to the uterus during the laparoscopic hysterectomy (a tenaculum was used for retraction).   The abdomen was desufflated and all instruments were then removed from the patient's abdomen.   The fascial incisions of both 10-mm sites were reapproximated with 0 Vicryl stiches; and all skin incisions were closed with a 3-0 Vicryl subcuticular stitch. The patient tolerated the procedures well.  All instruments, needles, and sponge counts were correct x 2. The patient was taken to the recovery room in stable condition.

## 2011-08-30 NOTE — Anesthesia Postprocedure Evaluation (Signed)
  Anesthesia Post-op Note  Patient: Victoria Delacruz  Procedure(s) Performed:  LAPAROSCOPIC ASSISTED VAGINAL HYSTERECTOMY; BILATERAL SALPINGECTOMY  Patient Location: PACU  Anesthesia Type: General  Level of Consciousness: awake, alert  and oriented  Airway and Oxygen Therapy: Patient Spontanous Breathing  Post-op Pain: mild  Post-op Assessment: Post-op Vital signs reviewed, Patient's Cardiovascular Status Stable, Respiratory Function Stable, Patent Airway, No signs of Nausea or vomiting and Pain level controlled  Post-op Vital Signs: Reviewed and stable  Complications: No apparent anesthesia complications

## 2011-08-30 NOTE — H&P (Signed)
Preoperative History and Physical  Victoria Delacruz is a 40 y.o. G2P0020 with No LMP recorded. here for surgical management of menorrhagia, dysmenorrhea and fibroids.    Proposed surgery: Laparoscopic-assisted vaginal hysterectomy (LAVH) and salpingectomy.   Past Medical History  Diagnosis Date  . ECTOPIC PREGNANCY 1997 and 2011    x 2  . Hypertension   . Fibromyalgia   . Fibroid   . Breast cancer 2004 and 2011  . Anemia   . Arthritis   . Dizziness   . Family history of breast cancer     grandmother  . Blood transfusion 05/17/11, 05/25/11    anemia  . Depression     Past Surgical History  Procedure Date  . Laparoscopy for ectopic pregnancy   . Laparoscopy w/ mini-laparotomy   . Reconstruction breast immediate / delayed w/ tissue expander 2004  . Mastectomy 2004    complete with reconstruction x 3, no b/p punctures to left arm    OB History    Grav Para Term Preterm Abortions TAB SAB Ect Mult Living   2    2  1 1   0      Medications: Current facility-administered medications:cefoTEtan (CEFOTAN) 2 g in dextrose 5 % 50 mL IVPB, 2 g, Intravenous, On Call to OR, Tereso Newcomer, MD;  lactated ringers infusion, , Intravenous, Continuous, Tereso Newcomer, MD, Last Rate: 125 mL/hr at 08/30/11 4098  Allergies: No Known Allergies   Social History  Substance Use Topics  . Smoking status: Never Smoker   . Smokeless tobacco: Never Used  . Alcohol Use: No   Family History  Problem Relation Age of Onset  . Hypertension Mother   . Hypertension Father     History of blood clots in lungs  . Lymphoma Sister     Hodgkins  . Cancer Sister   . Cancer Maternal Aunt   . Cancer Maternal Uncle   . Cancer Maternal Grandmother   . Cancer Maternal Grandfather     Review of Systems: Not indicated; not relevant to the procedure  PHYSICAL EXAM: Blood pressure 131/91, pulse 86, temperature 98.1 F (36.7 C), temperature source Oral, resp. rate 18, SpO2 99.00%. General: General  appearance - alert, well appearing, and in no distress Chest - clear to auscultation, no wheezes, rales or rhonchi, symmetric air entry Heart - normal rate and regular rhythm Abdomen - soft, nontender, nondistended, no masses or organomegaly scars from previous incisions well-healed Pelvic - examination not indicated Extremities - peripheral pulses normal, no pedal edema, no clubbing or cyanosis  Labs: Recent Results (from the past 336 hour(s))  CBC WITH DIFFERENTIAL   Collection Time   08/23/11  9:12 AM      Component Value Range   WBC 5.4  3.9 - 10.3 (10e3/uL)   NEUT# 3.3  1.5 - 6.5 (10e3/uL)   HGB 12.1  11.6 - 15.9 (g/dL)   HCT 11.9  14.7 - 82.9 (%)   Platelets 257  145 - 400 (10e3/uL)   MCV 88.2  79.5 - 101.0 (fL)   MCH 30.0  25.1 - 34.0 (pg)   MCHC 34.0  31.5 - 36.0 (g/dL)   RBC 5.62  1.30 - 8.65 (10e6/uL)   RDW 15.8 (*) 11.2 - 14.5 (%)   lymph# 1.7  0.9 - 3.3 (10e3/uL)   MONO# 0.3  0.1 - 0.9 (10e3/uL)   Eosinophils Absolute 0.1  0.0 - 0.5 (10e3/uL)   Basophils Absolute 0.0  0.0 - 0.1 (10e3/uL)   NEUT%  60.8  38.4 - 76.8 (%)   LYMPH% 31.2  14.0 - 49.7 (%)   MONO% 5.3  0.0 - 14.0 (%)   EOS% 2.0  0.0 - 7.0 (%)   BASO% 0.7  0.0 - 2.0 (%)  COMPREHENSIVE METABOLIC PANEL   Collection Time   08/23/11  9:12 AM      Component Value Range   Sodium 140  135 - 145 (mEq/L)   Potassium 3.4 (*) 3.5 - 5.3 (mEq/L)   Chloride 106  96 - 112 (mEq/L)   CO2 24  19 - 32 (mEq/L)   Glucose, Bld 115 (*) 70 - 99 (mg/dL)   BUN 11  6 - 23 (mg/dL)   Creatinine, Ser 0.45  0.50 - 1.10 (mg/dL)   Total Bilirubin 0.4  0.3 - 1.2 (mg/dL)   Alkaline Phosphatase 77  39 - 117 (U/L)   AST 22  0 - 37 (U/L)   ALT 10  0 - 35 (U/L)   Total Protein 7.8  6.0 - 8.3 (g/dL)   Albumin 4.2  3.5 - 5.2 (g/dL)   Calcium 9.1  8.4 - 40.9 (mg/dL)  LACTATE DEHYDROGENASE   Collection Time   08/23/11  9:12 AM      Component Value Range   LD 184  94 - 250 (U/L)  COMPREHENSIVE METABOLIC PANEL   Collection Time    08/23/11  9:12 AM      Component Value Range   Sodium 140  135 - 145 (mEq/L)   Potassium 3.4 (*) 3.5 - 5.3 (mEq/L)   Chloride 106  96 - 112 (mEq/L)   CO2 24  19 - 32 (mEq/L)   Glucose, Bld 115 (*) 70 - 99 (mg/dL)   BUN 11  6 - 23 (mg/dL)   Creatinine, Ser 8.11  0.50 - 1.10 (mg/dL)   Total Bilirubin 0.4  0.3 - 1.2 (mg/dL)   Alkaline Phosphatase 77  39 - 117 (U/L)   AST 22  0 - 37 (U/L)   ALT 10  0 - 35 (U/L)   Total Protein 7.8  6.0 - 8.3 (g/dL)   Albumin 4.2  3.5 - 5.2 (g/dL)   Calcium 9.1  8.4 - 91.4 (mg/dL)  LACTATE DEHYDROGENASE   Collection Time   08/23/11  9:12 AM      Component Value Range   LD 184  94 - 250 (U/L)  SURGICAL PCR SCREEN   Collection Time   08/23/11 11:52 AM      Component Value Range   MRSA, PCR NEGATIVE  NEGATIVE    Staphylococcus aureus NEGATIVE  NEGATIVE   PREGNANCY, URINE   Collection Time   08/30/11  8:37 AM      Component Value Range   Preg Test, Ur NEGATIVE      Endometrial biopsy (06/03/11)  ABUNDANT BLOOD, SCANTY SECRETORY ENDOMETRIUM WITH FOCAL STROMAL BREAKDOWN. NO ATYPIA, HYPERPLASIA OR MALIGNANCY   Pelvic ultrasound (05/18/11)  The uterus is slightly enlarged, measuring 10.9 x 5.8 x 6.3 cm (previously 8.9 x 4.9 x 5.4 cm.) There are multiple, at least four, myometrial fibroids. The largest is at the right fundus, measuring 2.5 cm (previously 1.8 cm.) The endometrial stripe is homogeneous and upper limits of normal for a patient this age, measuring 15 mm. Both ovaries have a normal size and appearance. The right ovary measures 3.9 x 2.8 x 3.0 cm, and the left ovary measures 3.4 x 1.4 x 1.9 cm. There is a small amount of simple free pelvic fluid.  IMPRESSION: Fibroid uterus. The overall size of the uterus and the individual fibroids have increased slightly in size compared with the prior study in 2006.   Assessment: Patient Active Problem List  Diagnoses  . MALIGNANT NEOPLASM OF BREAST UNSPECIFIED SITE  . FIBROIDS, UTERUS  . ANEMIA, IRON  DEFICIENCY  . MIGRAINE HEADACHE  . MASTECTOMY, LEFT, HX OF  . Hypertension  . Hand pain  . Neuropathy  . Night sweats  . Fatigue  . Nipple discharge  . SOB (shortness of breath)  . Family history of breast cancer  . Menorrhagia  . History of ectopic pregnancy  . Fibromyalgia    Plan: Patient is scheduled to undergo laparoscopic-assisted vaginal hysterectomy (LAVH) and salpingectomy.  Of note, patient has a history of breast cancer (DCIS s/p mastectomy and chemotherapy) but reports testing negative for BRCA mutations, there are no first degree relatives with breast cancer and no family history of ovarian cancer, so there is no clear indication for prophylactic oophorectomy.   Also given that the patient is young, surgical menopause could have major health risks such as loss of ovarian hormones that could lead to vasomotor symptoms and osteoporosis, increased risk of cardiovascular disease, and procedure-related complications. However, patient was counseled regarding prophylactic salpingectomy given that a growing body of knowledge reveals that the majority of cases of high grade serous "ovarian" cancer actually are fallopian tube cancers. The precursor lesions are though to arise from the fimbriated end of the fallopian tube and the cancer can have metastases from this primary lesion; and removal of fallopian tubes do not result in any known hormonal imbalance. The patient agrees with this proposed procedures of laparoscopic-assisted vaginal hysterectomy (LAVH) and bilateral salpingectomy . The risks of surgery were discussed in detail with the patient including but not limited to: bleeding which may require transfusion or reoperation; infection which may require antibiotics; injury to bowel, bladder, ureters or other surrounding organs; need for additional procedures including laparotomy; thromboembolic phenomenon, incisional problems and other postoperative/anesthesia complications. Discussed  expected length of stay and likelihood of success of alleviating her symptoms were also discussed.   To OR when ready.   ANYANWU,UGONNA A 08/30/2011 9:24 AM

## 2011-08-30 NOTE — Preoperative (Signed)
Beta Blockers   Reason not to administer Beta Blockers:Not Applicable 

## 2011-08-31 ENCOUNTER — Encounter (HOSPITAL_COMMUNITY): Payer: Self-pay | Admitting: Obstetrics & Gynecology

## 2011-08-31 DIAGNOSIS — IMO0001 Reserved for inherently not codable concepts without codable children: Secondary | ICD-10-CM

## 2011-08-31 LAB — CBC
HCT: 19.9 % — ABNORMAL LOW (ref 36.0–46.0)
HCT: 20.2 % — ABNORMAL LOW (ref 36.0–46.0)
Hemoglobin: 6.6 g/dL — CL (ref 12.0–15.0)
Hemoglobin: 6.7 g/dL — CL (ref 12.0–15.0)
MCH: 28.6 pg (ref 26.0–34.0)
MCH: 29 pg (ref 26.0–34.0)
MCHC: 33.2 g/dL (ref 30.0–36.0)
MCHC: 33.2 g/dL (ref 30.0–36.0)
MCV: 86.1 fL (ref 78.0–100.0)
MCV: 87.4 fL (ref 78.0–100.0)
Platelets: 202 10*3/uL (ref 150–400)
Platelets: 207 10*3/uL (ref 150–400)
RBC: 2.31 MIL/uL — ABNORMAL LOW (ref 3.87–5.11)
RBC: 2.31 MIL/uL — ABNORMAL LOW (ref 3.87–5.11)
RDW: 15.5 % (ref 11.5–15.5)
RDW: 15.8 % — ABNORMAL HIGH (ref 11.5–15.5)
WBC: 7.4 10*3/uL (ref 4.0–10.5)
WBC: 8.9 10*3/uL (ref 4.0–10.5)

## 2011-08-31 LAB — PREPARE RBC (CROSSMATCH)

## 2011-08-31 MED ORDER — ACETAMINOPHEN 500 MG PO TABS
1000.0000 mg | ORAL_TABLET | Freq: Once | ORAL | Status: AC
  Administered 2011-08-31: 1000 mg via ORAL
  Filled 2011-08-31: qty 2

## 2011-08-31 MED ORDER — DIPHENHYDRAMINE HCL 25 MG PO CAPS
25.0000 mg | ORAL_CAPSULE | Freq: Once | ORAL | Status: AC
  Administered 2011-08-31: 25 mg via ORAL
  Filled 2011-08-31: qty 1

## 2011-08-31 MED ORDER — FUROSEMIDE 10 MG/ML IJ SOLN
20.0000 mg | Freq: Once | INTRAMUSCULAR | Status: AC
  Administered 2011-08-31: 20 mg via INTRAVENOUS
  Filled 2011-08-31 (×2): qty 2

## 2011-08-31 NOTE — Progress Notes (Addendum)
1 Day Post-Op Procedure(s): LAPAROSCOPIC ASSISTED VAGINAL HYSTERECTOMY BILATERAL SALPINGECTOMY  Subjective: Patient reports incisional pain, tolerating PO and + flatus.  Foley still in place. Concerned about blood loss.  Objective: I have reviewed patient's vital signs, intake and output, medications and labs.  Excellent urine output.  General: alert and no distress Resp: clear to auscultation bilaterally Cardio: regular rate and rhythm GI: soft, non-tender; bowel sounds normal; no masses,  no organomegaly Extremities: extremities normal, atraumatic, no cyanosis or edema and Homans sign is negative, no sign of DVT Vaginal Bleeding: minimal  Assessment: s/p Procedure(s): LAPAROSCOPIC ASSISTED VAGINAL HYSTERECTOMY BILATERAL SALPINGECTOMY: Stable, progressing well.  Procedures complicated by significant intraoperative blood loss and anemia; patient needs transfusion.  No evidence of active bleeding on exam.  Lab Results  Component Value Date   WBC 7.4 08/31/2011   HGB 6.6* 08/31/2011   HCT 19.9* 08/31/2011   MCV 86.1 08/31/2011   PLT 202 08/31/2011    Plan: Will check another CBC now to ensure the trend is stable Will transfuse with 3 units of pRBCs; continue to monitor patient.  Advance diet and encourage ambulation.  Analgesia as needed. Likely discharge to home tomorrow if patient remains stable.   LOS: 1 day    Victoria Delacruz A 08/31/2011, 11:07 AM     Addendum Patient's repeat Hgb at 1100 is stable; no evidence of active bleeding Lab Results  Component Value Date   WBC 8.9 08/31/2011   HGB 6.7* 08/31/2011   HCT 20.2* 08/31/2011   MCV 87.4 08/31/2011   PLT 207 08/31/2011  Will proceed with transfusion as previously ordered.    Victoria Delacruz A 08/31/2011 12:03 PM

## 2011-08-31 NOTE — Addendum Note (Signed)
Addendum  created 08/31/11 4782 by Cephus Shelling   Modules edited:Notes Section

## 2011-08-31 NOTE — Anesthesia Postprocedure Evaluation (Signed)
  Anesthesia Post-op Note  Patient: Victoria Delacruz  Procedure(s) Performed:  LAPAROSCOPIC ASSISTED VAGINAL HYSTERECTOMY; BILATERAL SALPINGECTOMY  Patient Location: [facility]  Anesthesia Type: General  Level of Consciousness: awake  Airway and Oxygen Therapy: Patient Spontanous Breathing  Post-op Pain: none  Post-op Assessment: Post-op Vital signs reviewed  Post-op Vital Signs: Reviewed and stable  Complications: No apparent anesthesia complications

## 2011-08-31 NOTE — Progress Notes (Signed)
UR Chart review completed.  

## 2011-09-01 LAB — CBC
HCT: 31.7 % — ABNORMAL LOW (ref 36.0–46.0)
Hemoglobin: 10.6 g/dL — ABNORMAL LOW (ref 12.0–15.0)
MCH: 29 pg (ref 26.0–34.0)
MCHC: 33.4 g/dL (ref 30.0–36.0)
MCV: 86.6 fL (ref 78.0–100.0)
Platelets: 199 10*3/uL (ref 150–400)
RBC: 3.66 MIL/uL — ABNORMAL LOW (ref 3.87–5.11)
RDW: 16 % — ABNORMAL HIGH (ref 11.5–15.5)
WBC: 9.6 10*3/uL (ref 4.0–10.5)

## 2011-09-01 LAB — TYPE AND SCREEN
ABO/RH(D): O NEG
Antibody Screen: NEGATIVE
Unit division: 0
Unit division: 0
Unit division: 0

## 2011-09-01 MED ORDER — OXYCODONE-ACETAMINOPHEN 5-325 MG PO TABS: 1.0000 | ORAL_TABLET | ORAL | Status: AC | PRN

## 2011-09-01 MED ORDER — IBUPROFEN 600 MG PO TABS: 600.0000 mg | ORAL_TABLET | Freq: Four times a day (QID) | ORAL | Status: AC | PRN

## 2011-09-01 MED ORDER — ZOLPIDEM TARTRATE 5 MG PO TABS: 5.0000 mg | ORAL_TABLET | Freq: Every evening | ORAL | Status: DC | PRN

## 2011-09-01 NOTE — Progress Notes (Signed)
2 Days Post-Op Procedure(s): LAPAROSCOPIC ASSISTED VAGINAL HYSTERECTOMY BILATERAL SALPINGECTOMY  Subjective: Patient reports tolerating PO, + flatus and no problems voiding.  Tolerated transfusion overnight.  Objective: I have reviewed patient's vital signs, intake and output, medications, labs and pathology.  General: alert and no distress Resp: clear to auscultation bilaterally Cardio: regular rate and rhythm GI: soft, non-tender; bowel sounds normal; no masses,  no organomegaly and incision: clean, dry and intact Extremities: extremities normal, atraumatic, no cyanosis or edema and Homans sign is negative, no sign of DVT Vaginal Bleeding: minimal  Lab Results  Component Value Date   WBC 9.6 09/01/2011   HGB 10.6* 09/01/2011   HCT 31.7* 09/01/2011   MCV 86.6 09/01/2011   PLT 199 09/01/2011    Assessment: s/p Procedure(s): LAPAROSCOPIC ASSISTED VAGINAL HYSTERECTOMY BILATERAL SALPINGECTOMY: stable, progressing well, tolerating diet and s/p transfusion. Hgb 10.6.  Plan: Discharge home Routine postoperative discharge instructions reviewed. Follow up in clinic on 10/07/11 for postoperative evaluation.   LOS: 2 days    Victoria Delacruz A 09/01/2011, 11:09 AM

## 2011-09-01 NOTE — Discharge Summary (Signed)
Physician Discharge Summary  Patient ID: Victoria Delacruz MRN: [id number] DOB/AGE: [birth year] 38 y.o.  Admit date: 08/30/2011 Discharge date: 09/01/2011  Admission Diagnoses: Menorrhagia, fibroids   Procedure(s): LAPAROSCOPIC ASSISTED VAGINAL HYSTERECTOMY, BILATERAL SALPINGECTOMY  Discharged Condition: Stable  Hospital Course: Patient's surgery was complicated by increased intraoperative blood loss.  Her hemoglobin went from 12.1 to 6.7 after surgery.  There were no signs or symptoms of ongoing postoperative bleeding.  She was transfused with three unit of packed RBCs; which increased her hemoglobin to 10.6.  Otherwise, the patient had an uncomplicated postoperative course.  By time of discharge, her pain was controlled on oral pain medications; she was ambulating, voiding without difficulty, tolerating regular diet and passing flatus.  She was deemed stable for discharge to home.    Pathology: Uterus and cervix,and bilateral fallopian tubes - CERVIX: CHRONIC INFLAMMATION. - ENDOMETRIUM: INACTIVE APPEARING ENDOMETRIUM WITH FOCAL DECIDUALIZATION OF THE STROMA, SUGGESTIVE OF HORMONE EFFECT. - MYOMETRIUM: LEIOMYOMATA. - SEROSA: SUBSEROSAL LEIOMYOMA. - BILATERAL FALLOPIAN TUBES: PARATUBAL CYSTS.  Discharge Exam: Blood pressure 115/73, pulse 87, temperature 99.1 F (37.3 C), temperature source Oral, resp. rate 18, height 5\' 3"  (1.6 m), weight 73.936 kg (163 lb), SpO2 97.00%. General appearance: alert and no distress Resp: clear to auscultation bilaterally Cardio: regular rate and rhythm GI: soft, non-tender; bowel sounds normal; no masses,  no organomegaly Incision: laparoscopic incisions are clean, dry, intact; no erythema or induration Pelvic: minimal bleeding noted Extremities: extremities normal, atraumatic, no cyanosis or edema  Disposition: Home or Self Care  Discharge Orders    Future Appointments: Provider: Department: Dept Phone: Center:   10/04/2011 9:30 AM [facility]  Nurse [facility] [phone] None   10/07/2011 1:15 PM Tereso Newcomer, MD [facility] [phone] [facility]     Current Discharge Medication List    START taking these medications   Details  ibuprofen (ADVIL,MOTRIN) 600 MG tablet Take 1 tablet (600 mg total) by mouth every 6 (six) hours as needed for pain (mild pain). Qty: 60 tablet, Refills: 2    oxyCODONE-acetaminophen (PERCOCET) 5-325 MG per tablet Take 1-2 tablets by mouth every 4 (four) hours as needed (moderate to severe pain (when tolerating fluids)). Qty: 60 tablet, Refills: 0    zolpidem (AMBIEN) 5 MG tablet Take 1-2 tablets (5-10 mg total) by mouth at bedtime as needed for sleep. Qty: 30 tablet, Refills: 1      CONTINUE these medications which have NOT CHANGED   Details  amitriptyline (ELAVIL) 50 MG tablet Take 50 mg by mouth 2 (two) times daily.      gabapentin (NEURONTIN) 300 MG capsule Take 1 capsule (300 mg total) by mouth at bedtime. Qty: 30 capsule, Refills: 2   Associated Diagnoses: Fibromyalgia    hydrochlorothiazide (,MICROZIDE/HYDRODIURIL,) 12.5 MG capsule Take 1 capsule (12.5 mg total) by mouth daily. Qty: 30 capsule, Refills: 11   Associated Diagnoses: Migraine, unspecified, without mention of intractable migraine without mention of status migrainosus      STOP taking these medications     HYDROcodone-ibuprofen (VICOPROFEN) 7.5-200 MG per tablet      ferrous sulfate 325 (65 FE) MG tablet         Signed: Barton Want A 09/01/2011, 11:06 AM

## 2011-09-14 ENCOUNTER — Telehealth: Payer: Self-pay | Admitting: Family Medicine

## 2011-09-14 ENCOUNTER — Ambulatory Visit (INDEPENDENT_AMBULATORY_CARE_PROVIDER_SITE_OTHER): Payer: Self-pay | Admitting: Family Medicine

## 2011-09-14 ENCOUNTER — Encounter: Payer: Self-pay | Admitting: Family Medicine

## 2011-09-14 ENCOUNTER — Telehealth: Payer: Self-pay | Admitting: *Deleted

## 2011-09-14 VITALS — BP 136/91 | HR 99 | Temp 98.1°F | Ht 63.0 in | Wt 167.0 lb

## 2011-09-14 DIAGNOSIS — R300 Dysuria: Secondary | ICD-10-CM

## 2011-09-14 DIAGNOSIS — N3000 Acute cystitis without hematuria: Secondary | ICD-10-CM

## 2011-09-14 LAB — POCT URINALYSIS DIPSTICK
Bilirubin, UA: NEGATIVE
Glucose, UA: NEGATIVE
Leukocytes, UA: NEGATIVE
Nitrite, UA: NEGATIVE
Protein, UA: NEGATIVE
Spec Grav, UA: 1.025
Urobilinogen, UA: 1
pH, UA: 6

## 2011-09-14 LAB — POCT UA - MICROSCOPIC ONLY

## 2011-09-14 MED ORDER — CEFTRIAXONE SODIUM 1 G IJ SOLR
1.0000 g | Freq: Once | INTRAMUSCULAR | Status: AC
  Administered 2011-09-14: 1 g via INTRAMUSCULAR

## 2011-09-14 MED ORDER — CEPHALEXIN 500 MG PO CAPS: 500.0000 mg | ORAL_CAPSULE | Freq: Two times a day (BID) | ORAL | Status: AC

## 2011-09-14 MED ORDER — FLUCONAZOLE 150 MG PO TABS: 150.0000 mg | ORAL_TABLET | Freq: Once | ORAL | Status: AC

## 2011-09-14 MED ORDER — CEFTRIAXONE SODIUM 1 G IJ SOLR: 1.0000 g | Freq: Once | INTRAMUSCULAR | Status: DC

## 2011-09-14 NOTE — Patient Instructions (Signed)

## 2011-09-14 NOTE — Progress Notes (Signed)
  Subjective:    Victoria Delacruz is a 40 y.o. female who complains of abnormal smelling urine, burning with urination, foul smelling urine, frequency and incomplete bladder emptying. She has had symptoms for 3 days. Patient also complains of none. Patient denies back pain, fever and vaginal discharge. Patient does not have a history of recurrent UTI. Patient does not have a history of pyelonephritis. Patient does have her recent history of having a hysterectomy as well as bladder catheterization done. Patient had this surgery on October 29 required a two-day hospitalization  The following portions of the patient's history were reviewed and updated as appropriate: allergies, current medications, past family history, past medical history, past social history, past surgical history and problem list.  Review of Systems Pertinent items are noted in HPI.    Objective:    BP 136/91  Pulse 99  Temp(Src) 98.1 F (36.7 C) (Oral)  Ht 5\' 3"  (1.6 m)  Wt 167 lb (75.751 kg)  BMI 29.58 kg/m2  LMP 04/23/2011 General appearance: alert, cooperative and mild distress Lungs: clear to auscultation bilaterally Abdomen: soft, non-tender; bowel sounds normal; no masses,  no organomegaly positive suprapubic tenderness Pelvic: cervix normal in appearance, external genitalia normal, no adnexal masses or tenderness, rectovaginal septum normal, uterus normal size, shape, and consistency and vagina normal without discharge Extremities: extremities normal, atraumatic, no cyanosis or edema  Laboratory:  Urine dipstick: 4+ for hemoglobin.   Micro exam: 5-10 WBCs per HPF, 5 RBCs per HPF and hyline casts seen.    Assessment:    Acute cystitis     Plan:  Patient given one shot of ceftriaxone today. We'll do Keflex for the next week. Patient is a red flags and what to look out for and when to seek medical attention as necessary. Patient's urinary analysis is not extremely alarming but with recent intervention due to  catheterization and abdominal surgery will tear on the side of caution.  Medications: keflex.

## 2011-09-14 NOTE — Telephone Encounter (Signed)
Pt left message stating that she had recent surgery- hysterectomy and thinks she has a UTI from the catheter. She is requesting medication Rx to be called in for her.  I called pt and she told me that she has an appt @ [facility] today for this c/o. Pt had no further questions or c/o- next appt @ clinic confirmed for 10/07/11 @ 1315.

## 2011-09-14 NOTE — Telephone Encounter (Signed)
Patient had an indwelling catheter after her hysterectomy.  It is out but now she is experiencing urinary frequency and burning.  Is asking that she be prescribed an antibiotic.  Explained that we cannot do that until we know what she is infected with.  Told her to come in at 1:30 and we would get a specimen then let her see the doctor.  Patient agreeable.

## 2011-09-14 NOTE — Telephone Encounter (Signed)
Asking to speak with RN, recently had hysterectomy & now thinks she has a uti, asking for rx to be called in, advised pt we dont typically call in abx without being seen, we are full today so pt wants to speak with RN.

## 2011-09-30 DIAGNOSIS — Z90710 Acquired absence of both cervix and uterus: Secondary | ICD-10-CM | POA: Insufficient documentation

## 2011-10-04 ENCOUNTER — Ambulatory Visit (HOSPITAL_BASED_OUTPATIENT_CLINIC_OR_DEPARTMENT_OTHER): Payer: Self-pay

## 2011-10-04 DIAGNOSIS — C50419 Malignant neoplasm of upper-outer quadrant of unspecified female breast: Secondary | ICD-10-CM

## 2011-10-04 DIAGNOSIS — C50919 Malignant neoplasm of unspecified site of unspecified female breast: Secondary | ICD-10-CM

## 2011-10-04 MED ORDER — SODIUM CHLORIDE 0.9 % IJ SOLN
10.0000 mL | INTRAMUSCULAR | Status: DC | PRN
  Administered 2011-10-04: 10 mL via INTRAVENOUS
  Filled 2011-10-04: qty 10

## 2011-10-04 MED ORDER — HEPARIN SOD (PORK) LOCK FLUSH 100 UNIT/ML IV SOLN
500.0000 [IU] | Freq: Once | INTRAVENOUS | Status: AC
  Administered 2011-10-04: 500 [IU] via INTRAVENOUS
  Filled 2011-10-04: qty 5

## 2011-10-04 NOTE — Patient Instructions (Signed)
Call MD for problems 

## 2011-10-07 ENCOUNTER — Encounter: Payer: Self-pay | Admitting: Obstetrics & Gynecology

## 2011-10-07 ENCOUNTER — Ambulatory Visit (INDEPENDENT_AMBULATORY_CARE_PROVIDER_SITE_OTHER): Payer: Self-pay | Admitting: Obstetrics & Gynecology

## 2011-10-07 DIAGNOSIS — Z09 Encounter for follow-up examination after completed treatment for conditions other than malignant neoplasm: Secondary | ICD-10-CM

## 2011-10-07 DIAGNOSIS — Z90710 Acquired absence of both cervix and uterus: Secondary | ICD-10-CM

## 2011-10-07 NOTE — Progress Notes (Signed)
  Subjective:     Victoria Delacruz is a 40 y.o. female who presents to the clinic 5 weeks status post laparoscopic assisted vaginal hysterectomy for abnormal uterine bleeding and fibroids. Eating a regular diet without difficulty. Bowel movements are normal. The patient is not having any pain.  The following portions of the patient's history were reviewed and updated as appropriate: allergies, current medications, past family history, past medical history, past social history, past surgical history and problem list.  Review of Systems A comprehensive review of systems was negative.    Objective:    BP 128/87  Pulse 97  Temp(Src) 98 F (36.7 C) (Oral)  Ht 5\' 3"  (1.6 m)  Wt 163 lb 4.8 oz (74.072 kg)  BMI 28.93 kg/m2  LMP 04/23/2011 General:  alert and no distress  Pelvic: Normal external genitalia, no vaginal lesions, well healed vaginal cuff  Abdomen: soft, bowel sounds active, non-tender  Incision:   laparoscopic sites are healing well, no drainage, no erythema, no hernia, no seroma, no swelling, no dehiscence     Pathology 08/30/11 Uterus and cervix, and bilateral fallopian tubes - CERVIX: CHRONIC INFLAMMATION. - ENDOMETRIUM: INACTIVE APPEARING ENDOMETRIUM WITH FOCAL DECIDUALIZATION OF THE STROMA, SUGGESTIVE OF HORMONE EFFECT. - MYOMETRIUM: LEIOMYOMATA. - SEROSA: SUBSEROSAL LEIOMYOMA. - BILATERAL FALLOPIAN TUBES: PARATUBAL CYSTS  Assessment:    Doing well postoperatively. Operative findings again reviewed. Pathology report discussed.    Plan:    1. Continue any current medications. 2. Wound care discussed. 3. Activity restrictions: routine, pelvic rest for 6 weeks after surgery 4. Follow up: 1 year for annual exam.

## 2011-10-07 NOTE — Progress Notes (Signed)
Pt declines flu vaccine

## 2011-10-21 ENCOUNTER — Other Ambulatory Visit: Payer: Self-pay | Admitting: Obstetrics and Gynecology

## 2011-10-21 ENCOUNTER — Ambulatory Visit (INDEPENDENT_AMBULATORY_CARE_PROVIDER_SITE_OTHER): Payer: Self-pay | Admitting: Family Medicine

## 2011-10-21 ENCOUNTER — Encounter: Payer: Self-pay | Admitting: Family Medicine

## 2011-10-21 VITALS — BP 136/93 | HR 91 | Temp 98.1°F | Ht 63.0 in | Wt 164.0 lb

## 2011-10-21 DIAGNOSIS — N6320 Unspecified lump in the left breast, unspecified quadrant: Secondary | ICD-10-CM

## 2011-10-21 DIAGNOSIS — C50919 Malignant neoplasm of unspecified site of unspecified female breast: Secondary | ICD-10-CM

## 2011-10-21 NOTE — Patient Instructions (Signed)
We will send you for ultrasound of the site. Please followup with your cancer doctor on a regular schedule We will call you with the results. If it shows anything we will set you up faster with your cancer doctor for evaluation

## 2011-10-21 NOTE — Assessment & Plan Note (Addendum)
Patient here with asymmetry on left side below mastectomy site. Discussed with Dr. Deirdre Priest who feels this is likely to be adipose tissue. Will evaluate with ultrasound to see if there is any discrete mass. Patient to followup with her doctor at the cancer center.

## 2011-10-21 NOTE — Progress Notes (Signed)
  Subjective:    Patient ID: Victoria Delacruz, female    DOB: 12-08-1970, 40 y.o.   MRN: [id number]  HPI  Patient here today for evaluation of vague left-sided asymmetry on her abdominal wall. She states that she noticed it 2 weeks ago and it is not growing. She does frequent exams of her breast area. She feels like this area is noticeable more when she is standing. There is no pain associated with this area. It has not changed since she first saw it. She's very nervous and this may be a recurrence of her breast cancer. Review of Systems No night sweats, weight loss    Objective:   Physical Exam Vital signs reviewed General appearance - alert, well appearing, and in no distress and oriented to person, place, and time Skin exam-there is an area of asymmetry right at the base of the ribs on the left side of her abdomen. There is no discrete mass. This is able to be felt when she is standing but not when she is lying flat. This is a soft area that is more prominent than the surrounding area that is not matched on the other side.       Assessment & Plan:

## 2011-11-03 ENCOUNTER — Other Ambulatory Visit: Payer: Self-pay | Admitting: *Deleted

## 2011-11-03 DIAGNOSIS — N6320 Unspecified lump in the left breast, unspecified quadrant: Secondary | ICD-10-CM

## 2011-11-08 ENCOUNTER — Ambulatory Visit
Admission: RE | Admit: 2011-11-08 | Discharge: 2011-11-08 | Disposition: A | Discharge status: Home / Self Care | Payer: Self-pay | Source: Ambulatory Visit | Attending: Obstetrics and Gynecology | Admitting: Obstetrics and Gynecology

## 2011-11-08 ENCOUNTER — Other Ambulatory Visit: Payer: Self-pay | Admitting: *Deleted

## 2011-11-08 DIAGNOSIS — N6320 Unspecified lump in the left breast, unspecified quadrant: Secondary | ICD-10-CM

## 2011-12-07 ENCOUNTER — Ambulatory Visit: Payer: Self-pay | Admitting: Oncology

## 2011-12-07 ENCOUNTER — Other Ambulatory Visit: Payer: Self-pay | Admitting: Lab

## 2011-12-30 ENCOUNTER — Ambulatory Visit (HOSPITAL_BASED_OUTPATIENT_CLINIC_OR_DEPARTMENT_OTHER): Payer: Self-pay | Admitting: Physician Assistant

## 2011-12-30 ENCOUNTER — Ambulatory Visit: Payer: Self-pay

## 2011-12-30 ENCOUNTER — Other Ambulatory Visit: Payer: Self-pay | Admitting: Lab

## 2011-12-30 ENCOUNTER — Telehealth: Payer: Self-pay | Admitting: Oncology

## 2011-12-30 ENCOUNTER — Ambulatory Visit: Payer: Self-pay | Admitting: Physician Assistant

## 2011-12-30 DIAGNOSIS — C50919 Malignant neoplasm of unspecified site of unspecified female breast: Secondary | ICD-10-CM

## 2011-12-30 DIAGNOSIS — C50419 Malignant neoplasm of upper-outer quadrant of unspecified female breast: Secondary | ICD-10-CM

## 2011-12-30 DIAGNOSIS — D509 Iron deficiency anemia, unspecified: Secondary | ICD-10-CM

## 2011-12-30 DIAGNOSIS — Z170 Estrogen receptor positive status [ER+]: Secondary | ICD-10-CM

## 2011-12-30 LAB — COMPREHENSIVE METABOLIC PANEL
ALT: <8 U/L (ref 0–35)
AST: 16 U/L (ref 0–37)
Albumin: 3.8 g/dL (ref 3.5–5.2)
Alkaline Phosphatase: 81 U/L (ref 39–117)
BUN: 11 mg/dL (ref 6–23)
CO2: 26 mEq/L (ref 19–32)
Calcium: 9 mg/dL (ref 8.4–10.5)
Chloride: 105 mEq/L (ref 96–112)
Creatinine, Ser: 0.73 mg/dL (ref 0.50–1.10)
Glucose, Bld: 105 mg/dL — ABNORMAL HIGH (ref 70–99)
Potassium: 3.3 mEq/L — ABNORMAL LOW (ref 3.5–5.3)
Sodium: 139 mEq/L (ref 135–145)
Total Bilirubin: 0.3 mg/dL (ref 0.3–1.2)
Total Protein: 7 g/dL (ref 6.0–8.3)

## 2011-12-30 LAB — CBC WITH DIFFERENTIAL/PLATELET
BASO%: 0.7 % (ref 0.0–2.0)
Basophils Absolute: 0 10*3/uL (ref 0.0–0.1)
EOS%: 1.1 % (ref 0.0–7.0)
Eosinophils Absolute: 0.1 10*3/uL (ref 0.0–0.5)
HCT: 36.3 % (ref 34.8–46.6)
HGB: 12.4 g/dL (ref 11.6–15.9)
LYMPH%: 32.1 % (ref 14.0–49.7)
MCH: 31 pg (ref 25.1–34.0)
MCHC: 34.2 g/dL (ref 31.5–36.0)
MCV: 90.8 fL (ref 79.5–101.0)
MONO#: 0.3 10*3/uL (ref 0.1–0.9)
MONO%: 5.9 % (ref 0.0–14.0)
NEUT#: 3.4 10*3/uL (ref 1.5–6.5)
NEUT%: 60.2 % (ref 38.4–76.8)
Platelets: 238 10*3/uL (ref 145–400)
RBC: 4 10*6/uL (ref 3.70–5.45)
RDW: 13.5 % (ref 11.2–14.5)
WBC: 5.6 10*3/uL (ref 3.9–10.3)
lymph#: 1.8 10*3/uL (ref 0.9–3.3)

## 2011-12-30 LAB — LACTATE DEHYDROGENASE: LDH: 127 U/L (ref 94–250)

## 2011-12-30 MED ORDER — HEPARIN SOD (PORK) LOCK FLUSH 100 UNIT/ML IV SOLN
500.0000 [IU] | Freq: Once | INTRAVENOUS | Status: AC
  Administered 2011-12-30: 500 [IU] via INTRAVENOUS
  Filled 2011-12-30: qty 5

## 2011-12-30 MED ORDER — SODIUM CHLORIDE 0.9 % IJ SOLN
10.0000 mL | INTRAMUSCULAR | Status: DC | PRN
  Administered 2011-12-30: 10 mL via INTRAVENOUS
  Filled 2011-12-30: qty 10

## 2011-12-30 NOTE — Patient Instructions (Signed)
Breast Problems and Self-Exam Completing monthly breast exams may pick up problems early and save lives. There can be numerous causes of swelling, tenderness or lumps in the breasts. Some of these causes are:   Fibrocystic breast syndrome (noncancerous lumps). This is the most common cause of lumps in the breast.   Fibroadenoma breast tumors of unknown cause. These are noncancerous (benign) lumps.   Benign fatty tumors (lipomas).   Cancer of the breast.  By doing monthly breast exams, you get to know how your breasts feel and how they can change from month to month. This allows you to notice changes early. It can also offer you some reassurance that your breast health is good.  BREAST SELF-EXAM There are a few points to follow when doing a thorough breast exam. The best time to examine your breasts is 5 to 7 days after your menstrual period is over. During menstruation, the breasts are lumpier, and it may be more difficult to pick up changes. If you do not menstruate, have reached menopause, or had a hysterectomy (uterus removal), examine your breasts the first day of every month. After 3 to 4 months, you will become more familiar with the variations of your breasts and more comfortable with the exam.  Perform your breast exam monthly. Keep a written record with breast changes or normal findings for each breast. This makes it easier to be sure of changes, so you do not need to depend only on memory for size, tenderness, or location. Try to do the exam at the same time each month, and write down where you are in your menstrual cycle, if you are still menstruating.   Look at your breasts. Stand in front of a mirror with your hands clasped behind your head. Tighten your chest muscles and look for asymmetry. This means a difference in shape or contour from one breast to the other, such as puckers, dips or bumps. Also, look for skin changes.   Lean forward with your hands on your hips. Again, look for  symmetry and skin changes.   While showering, soap the breasts. Then, carefully feel the breasts with your fingertips, while holding the other arm (on the side of the breast you are examining) over your head. Do this with each breast, carefully feeling for lumps or changes. Typically, a circular motion with moderate fingertip pressure should be used.   Repeat this exam while lying on your back. Put your arm behind your head and a pillow under your shoulders. Again, use your fingertips to examine both breasts, feeling for lumps and thickening. Begin at the top of your breast, and go clockwise around the whole breast.   At the end of your exam, gently squeeze each nipple to see if there is any drainage of fluids. Look for nipple changes, dimpling, or redness.   Lastly, examine the upper chest and collarbone (clavicle) areas, and in your armpits.  It is not necessary to be alarmed if you find a breast lump. Most of them are not cancerous. However, it is necessary to see your caregiver if a lump is found, in order to have it looked at. Document Released: 10/18/2005 Document Revised: 06/30/2011 Document Reviewed: 02/04/2009 [employer] Patient Information 2012 [employer]. 

## 2011-12-30 NOTE — Telephone Encounter (Signed)
  Appt made and printed for 4/23  aom

## 2011-12-30 NOTE — Progress Notes (Signed)
[facility] OFFICE PROGRESS NOTE  Victoria Plunk, MD, MD CC: Angelia Mould. Derrell Lolling, M.D.  Maxie Better, M.D.  Yaakov Guthrie. [name], M.D.  Alben Deeds, MD   INTERIM HISTORY:  Victoria Delacruz returns to the clinic for followup of her recurrent invasive ductal carcinoma involving the residual left breast tissue following mastectomy and immediate saline reconstruction from October of 2004.  Since her last clinic visit in October of 2012, she reports no fatigue. She has been more active, trying to lose some weight and increasing her exercise routine.  No fevers, chills, or night sweats.  No dyspnea or cough. No nipple discharge. She had a left  Mammogram on January 7th 2013 at the [facility] with negative results. She is due for a Bilateral mammogram in July 2013.   She has normal appetite and has had no problems with nausea, vomiting, constipation, or diarrhea.  No dysuria, no frequency, or hematuria.  No alteration in sensation or balance.  She does report intermittent generalized myalgias which she states has been a chronic issue related to her fibromyalgia and has not really changed over baseline.  She is not having issues with ongoing swelling in extremities.  Also of note, she states she  underwent a laparoscopic hysterectomy on August 30, 2011 successfully. She was discharged on October 31st .No complications from surgery were noted.   Current medications are reviewed and recorded.Her last LDH on October 22 was 184. Her new LDH is pending.   MEDICAL HISTORY: Past Medical History  Diagnosis Date  . ECTOPIC PREGNANCY 1997 and 2011    x 2  . Hypertension   . Fibromyalgia   . Fibroid   . Breast cancer 2004 and 2011  . Anemia   . Arthritis   . Dizziness   . Family history of breast cancer     grandmother  . Blood transfusion 05/17/11, 05/25/11    anemia  . Depression     SURGICAL HISTORY:  Past Surgical History  Procedure Date  . Laparoscopy for ectopic pregnancy   .  Laparoscopy w/ mini-laparotomy   . Reconstruction breast immediate / delayed w/ tissue expander 2004  . Mastectomy 2004    complete with reconstruction x 3, no b/p punctures to left arm  . Laparoscopic assisted vaginal hysterectomy 08/30/2011    Procedure: LAPAROSCOPIC ASSISTED VAGINAL HYSTERECTOMY;  Surgeon: Tereso Newcomer, MD;  Location: [facility];  Service: Gynecology;  Laterality: N/A;    MEDICATIONS: Current Outpatient Prescriptions  Medication Sig Dispense Refill  . amitriptyline (ELAVIL) 50 MG tablet Take 50 mg by mouth 2 (two) times daily.        Marland Kitchen gabapentin (NEURONTIN) 300 MG capsule Take 1 capsule (300 mg total) by mouth at bedtime.  30 capsule  2  . hydrochlorothiazide (,MICROZIDE/HYDRODIURIL,) 12.5 MG capsule Take 1 capsule (12.5 mg total) by mouth daily.  30 capsule  11  . zolpidem (AMBIEN) 5 MG tablet Take 1-2 tablets (5-10 mg total) by mouth at bedtime as needed for sleep.  30 tablet  1   No current facility-administered medications for this visit.   Facility-Administered Medications Ordered in Other Visits  Medication Dose Route Frequency Provider Last Rate Last Dose  . heparin lock flush 100 unit/mL  500 Units Intravenous Once Gerarda Fraction Murinson, MD      . sodium chloride 0.9 % injection 10 mL  10 mL Intravenous PRN Samul Dada, MD        ALLERGIES:   has no known allergies.  REVIEW OF SYSTEMS:  The rest of the 14-point review of system was negative.   Filed Vitals:   12/30/11 0838  BP: 130/86  Pulse: 77  Temp: 97.4 F (36.3 C)   Wt Readings from Last 3 Encounters:  12/30/11 161 lb 3.2 oz (73.12 kg)  10/21/11 164 lb (74.39 kg)  10/07/11 163 lb 4.8 oz (74.072 kg)     PHYSICAL EXAMINATION:   In general, this is a well-developed, well-nourished, black female, in no acute distress.  HEENT:  Sclerae nonicteric.  There is no oral thrush or mucositis.  Skin without rashes or lesions.  Lymph:  No cervical, supraclavicular, axillary, or inguinal  lymphadenopathy.  Cardiac:  Regular rate and rhythm without murmurs or gallops.  Peripheral pulses are 2+.  She has a right subclavian Port-A-Cath without signs of infection.  Chest:  Lungs are clear to auscultation.  There is a right subclavian Port-A-Cath without signs of infection.  Breast exam:  Right breast without masses or nipple discharge.  Left breast has been reconstructed following left-sided mastectomy.  There is no masses or chest wall tenderness.  Abdomen:  Positive bowel sounds, soft, nontender, nondistended.  No organomegaly.  Extremities without edema, cyanosis or calf tenderness.  Neurologic:  Alert and oriented times 3.  Strength, sensation, and coordination all grossly intact.   LABORATORY/RADIOLOGY DATA:   Lab 12/30/11 0821  WBC 5.6  HGB 12.4  HCT 36.3  PLT 238  MCV 90.8  MCH 31.0  MCHC 34.2  RDW 13.5  LYMPHSABS 1.8  MONOABS 0.3  EOSABS 0.1  BASOSABS 0.0  BANDABS --    CMP   No results found for this basename: NA:5,K:5,CL:5,CO2:5,GLUCOSE:5,BUN:5,CREATININE:5,GFRCGP,:5,CALCIUM:5,MG:5,AST:5,ALT:5,ALKPHOS:5,BILITOT:5 in the last 168 hours      Component Value Date/Time   BILITOT 0.4 08/23/2011 0912   BILITOT 0.4 08/23/2011 0912     Radiology Studies:  No results found.     ASSESSMENT AND PLAN:   1. [name] is a 41 year old black female with recurrent invasive ductal carcinoma involving the residual left breast tissue following mastectomy and immediate reconstruction from August 05, 2003.  This was a 4.0 cm high-grade ductal carcinoma in situ.  She also had a 6 mm subdermal recurrence in the upper outer quadrant of her left breast and underwent local excision May 28, 2010 followed by adjuvant chemotherapy with Cytoxan and Taxotere with Neulasta support completing 4 cycles through August 10, 2010.  She went on to receive external radiation therapy to the chest November 8th through October 20, 2010 then was initiated on adjuvant tamoxifen.  She  sporadically took her tamoxifen and then ultimately discontinued this after she started having ongoing issues with vaginal bleeding.  She is ultimately had hysterectomy on August 30, 2011. No further bleeding issues. 2. Nipple discharge: resolved. Followed by her gynecologist. 3. The patient will have Port-A-Cath flush today and will continue to maintain port flushes q.6- to 8  weeks. 4. The patient will be scheduled for followup with Dr. Arline Asp in April 2013 at which time we will reassess CBC with diff, CMET and LDH.  She will also be due for Port-A-Cath flush at that time.  She is advised to call in the interim if any questions or problems.

## 2011-12-31 ENCOUNTER — Telehealth: Payer: Self-pay | Admitting: Medical Oncology

## 2011-12-31 NOTE — Telephone Encounter (Signed)
I spoke with Victoria Delacruz to let her know that her potassium was 3.3 12/30/11. Per Dr. Arline Asp he would like for her to take K-Dur 20 meq daily. She states she already has potassium and will restart.

## 2012-02-22 ENCOUNTER — Encounter: Payer: Self-pay | Admitting: Oncology

## 2012-02-22 ENCOUNTER — Ambulatory Visit (HOSPITAL_BASED_OUTPATIENT_CLINIC_OR_DEPARTMENT_OTHER): Payer: Self-pay

## 2012-02-22 ENCOUNTER — Other Ambulatory Visit (HOSPITAL_BASED_OUTPATIENT_CLINIC_OR_DEPARTMENT_OTHER): Payer: Self-pay | Admitting: Lab

## 2012-02-22 ENCOUNTER — Ambulatory Visit (HOSPITAL_BASED_OUTPATIENT_CLINIC_OR_DEPARTMENT_OTHER): Payer: Self-pay | Admitting: Oncology

## 2012-02-22 VITALS — BP 138/93 | HR 82 | Temp 98.5°F

## 2012-02-22 VITALS — BP 138/90 | HR 70 | Temp 97.8°F | Ht 63.0 in | Wt 161.5 lb

## 2012-02-22 DIAGNOSIS — C50419 Malignant neoplasm of upper-outer quadrant of unspecified female breast: Secondary | ICD-10-CM

## 2012-02-22 DIAGNOSIS — C50919 Malignant neoplasm of unspecified site of unspecified female breast: Secondary | ICD-10-CM

## 2012-02-22 DIAGNOSIS — IMO0001 Reserved for inherently not codable concepts without codable children: Secondary | ICD-10-CM

## 2012-02-22 DIAGNOSIS — D509 Iron deficiency anemia, unspecified: Secondary | ICD-10-CM

## 2012-02-22 LAB — CBC WITH DIFFERENTIAL/PLATELET
BASO%: 0.6 % (ref 0.0–2.0)
Basophils Absolute: 0 10*3/uL (ref 0.0–0.1)
EOS%: 0.8 % (ref 0.0–7.0)
Eosinophils Absolute: 0.1 10*3/uL (ref 0.0–0.5)
HCT: 37.2 % (ref 34.8–46.6)
HGB: 12.7 g/dL (ref 11.6–15.9)
LYMPH%: 25.1 % (ref 14.0–49.7)
MCH: 31.9 pg (ref 25.1–34.0)
MCHC: 34.2 g/dL (ref 31.5–36.0)
MCV: 93.2 fL (ref 79.5–101.0)
MONO#: 0.3 10*3/uL (ref 0.1–0.9)
MONO%: 4.6 % (ref 0.0–14.0)
NEUT#: 4.4 10*3/uL (ref 1.5–6.5)
NEUT%: 68.9 % (ref 38.4–76.8)
Platelets: 237 10*3/uL (ref 145–400)
RBC: 3.99 10*6/uL (ref 3.70–5.45)
RDW: 13.8 % (ref 11.2–14.5)
WBC: 6.3 10*3/uL (ref 3.9–10.3)
lymph#: 1.6 10*3/uL (ref 0.9–3.3)
nRBC: 0 % (ref 0–0)

## 2012-02-22 LAB — COMPREHENSIVE METABOLIC PANEL
ALT: 12 U/L (ref 0–35)
AST: 21 U/L (ref 0–37)
Albumin: 4.2 g/dL (ref 3.5–5.2)
Alkaline Phosphatase: 77 U/L (ref 39–117)
BUN: 8 mg/dL (ref 6–23)
CO2: 25 mEq/L (ref 19–32)
Calcium: 9.5 mg/dL (ref 8.4–10.5)
Chloride: 105 mEq/L (ref 96–112)
Creatinine, Ser: 0.76 mg/dL (ref 0.50–1.10)
Glucose, Bld: 93 mg/dL (ref 70–99)
Potassium: 4 mEq/L (ref 3.5–5.3)
Sodium: 138 mEq/L (ref 135–145)
Total Bilirubin: 0.4 mg/dL (ref 0.3–1.2)
Total Protein: 7.2 g/dL (ref 6.0–8.3)

## 2012-02-22 LAB — LACTATE DEHYDROGENASE: LDH: 179 U/L (ref 94–250)

## 2012-02-22 MED ORDER — SODIUM CHLORIDE 0.9 % IJ SOLN
10.0000 mL | INTRAMUSCULAR | Status: DC | PRN
  Administered 2012-02-22: 10 mL via INTRAVENOUS
  Filled 2012-02-22: qty 10

## 2012-02-22 MED ORDER — HEPARIN SOD (PORK) LOCK FLUSH 100 UNIT/ML IV SOLN
500.0000 [IU] | Freq: Once | INTRAVENOUS | Status: AC
  Administered 2012-02-22: 500 [IU] via INTRAVENOUS
  Filled 2012-02-22: qty 5

## 2012-02-22 NOTE — Progress Notes (Signed)
This office note has been dictated.  #[id number]

## 2012-02-22 NOTE — Progress Notes (Signed)
CC:   Ellery Plunk, MD Angelia Mould. Derrell Lolling, M.D. Alben Deeds, MD Yaakov Guthrie. [name], M.D.  PROBLEM LIST: 1. 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     Tumor was 4.0 cm.  Sentinel lymph nodes were negative.  Estrogen     receptor was 24%, progesterone receptor 2%.  The patient underwent     a saline implant. 2. 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  The patient had a 6.0 mm subdermal recurrence in the     upper outer quadrant of the left breast at about the 1-2 o'clock     position.  She underwent local excision of the tumor on 05/28/2010     followed by 4 cycles of adjuvant chemotherapy with Cytoxan and     Taxotere in combination with Neulasta from 06/08/2010 through     08/10/2010.  Radiation treatments to the left breast and chest wall     were given from 09/08/2010 through 11/04/2010 consisting of 4780     cGy in 26 fractions with an 1800 cGy boost in 9 fractions.  She     took tamoxifen sporadically for awhile, then started having vaginal     bleeding and tamoxifen was ultimately discontinued in June of 2012.     ER 99%, PR 53% and Her2 was negative with a ratio of 1.17.      3. Laparoscopic assisted vaginal hysterectomy 08/30/2011. 4. History of iron-deficiency anemia secondary to heavy periods and     possibly fibroids treated with intravenous iron in the past. 5. Irritable bowel syndrome. 6. History of hypertension. 7. Family history of breast cancer, but negative genetic testing. 8. Fibromyalgia diagnosed in the fall 2011. 9. Right-sided Port-A-Cath placed on 05/28/2010, currently being     maintained with heparin flushes every 2 months.   MEDICATIONS: 1. Amitriptyline 50 mg twice a day. 2. Neurontin 300 mg at bedtime. 3. Hydrochlorothiazide 12.5 mg daily. 4. K-Dur 20 mEq twice daily.  HISTORY:  I saw Dvora Buitron today for  followup of her recurrent invasive ductal carcinoma involving the residual left breast  after the patient had undergone mastectomy and immediate saline reconstruction for high-grade DCIS in October 2004.  The invasive cancer was detected in July 2011.  Lillyen was last seen by [place] on 12/30/2011. There has been no change in her condition.  We have been maintaining her Port-A-Cath with heparin flushes.  She is still having some symptoms of her fibromyalgia. This particularly affects her hands with some swelling and stiffness. Aside from this, she seems to be doing quite well.  She has no symptoms to suggest recurrent breast cancer, and no complaints today.  PHYSICAL EXAM:  She looks well.  Weight is stable at 161.5 pounds, height 5 feet 3 inches, body surface area 1.8 sq/m.  Blood pressure 138/90.  Other vital signs are normal.  There is no scleral icterus. Mouth and pharynx are benign.  No peripheral adenopathy palpable.  Heart and lungs were normal.  She does have a right-sided Port-A-Cath that was flushed with heparin today.  This was placed on 05/28/2010.  The right breast is somewhat pendulous and has undergone some reconstructive surgery.  Scars are well healed.  There are no suspicious findings.  On the left, the patient has undergone mastectomy and a saline implant. The area of her scar in the upper outer  quadrant looks fine.  Laterally at about the 2-3 o'clock position of the breast is an area of firmness or induration.  I cannot be sure if this was rib or some scar tissue. What is reassuring is that the patient did undergo mammogram and ultrasound of the left breast on 01-12-2024of this year and that was negative.  Abdomen:  Benign with no organomegaly or masses palpable. Extremities:  No peripheral edema.  No obvious lymphedema of the left arm.  The patient has some palm erythema and some puffiness of her hands.  Neurologic:  Grossly normal.  LABORATORY DATA:  Today, white  count 6.3, ANC 4.4, hemoglobin 12.7, hematocrit 37.2, platelets 237,000.  Chemistries are pending. Chemistries from 12/30/2011 notable for a glucose of 105 and a potassium of 3.3.  The patient was made aware of her low potassium 2 months ago.  IMAGING STUDIES: 1. Digital screening mammogram of the right breast was negative on     05/21/2011. 2. Chest x-ray, 2 view, from 06/10/2011 was negative. 3. Digital diagnostic left mammogram on 11-13-2011 showed no evidence     of left breast recurrence.  It was noted that the patient is due     for right screening mammogram in July 2013.  Ultrasound of the left     breast showed normal skin and fat.  No discrete mass was seen.  IMPRESSION AND PLAN:  Jakerria seems to be doing well now approaching 2 years from the time of diagnosis of her invasive cancer.  She is on no medicine at this time.  I should mention that her estrogen receptor on the invasive was 99%, progesterone receptor 53%, HER-2/neu was negative with a ratio of 1.17.  We will keep close watch of the lateral aspect of the left breast.  I am somewhat reassured by the normal imaging studies that were carried out on 11/12/22.  Keyera is due for screening mammogram of her right breast in July 2013 and a chest x-ray also in July or August.  We will plan to see Alauna again in 2 months at which time we will check CBC and chemistries.  She will be due for a Port-A-Cath flush at that time.    ______________________________ Samul Dada, M.D. DSM/MEDQ  D:  02/22/2012  T:  02/22/2012  Job:  [id number]

## 2012-02-22 NOTE — Patient Instructions (Signed)
Call MD for problems 

## 2012-02-25 ENCOUNTER — Telehealth: Payer: Self-pay | Admitting: Oncology

## 2012-02-25 NOTE — Telephone Encounter (Signed)
s/w pt and she is aware of her appts

## 2012-03-31 IMAGING — CR DG CHEST 1V PORT
1 series · 1 of 1 positions shown · non-contrast
Comparison: 12/18/2008

CLINICAL DATA: Postop Port-A-Cath insertion.

PORTABLE CHEST - 1 VIEW

[view not recorded]
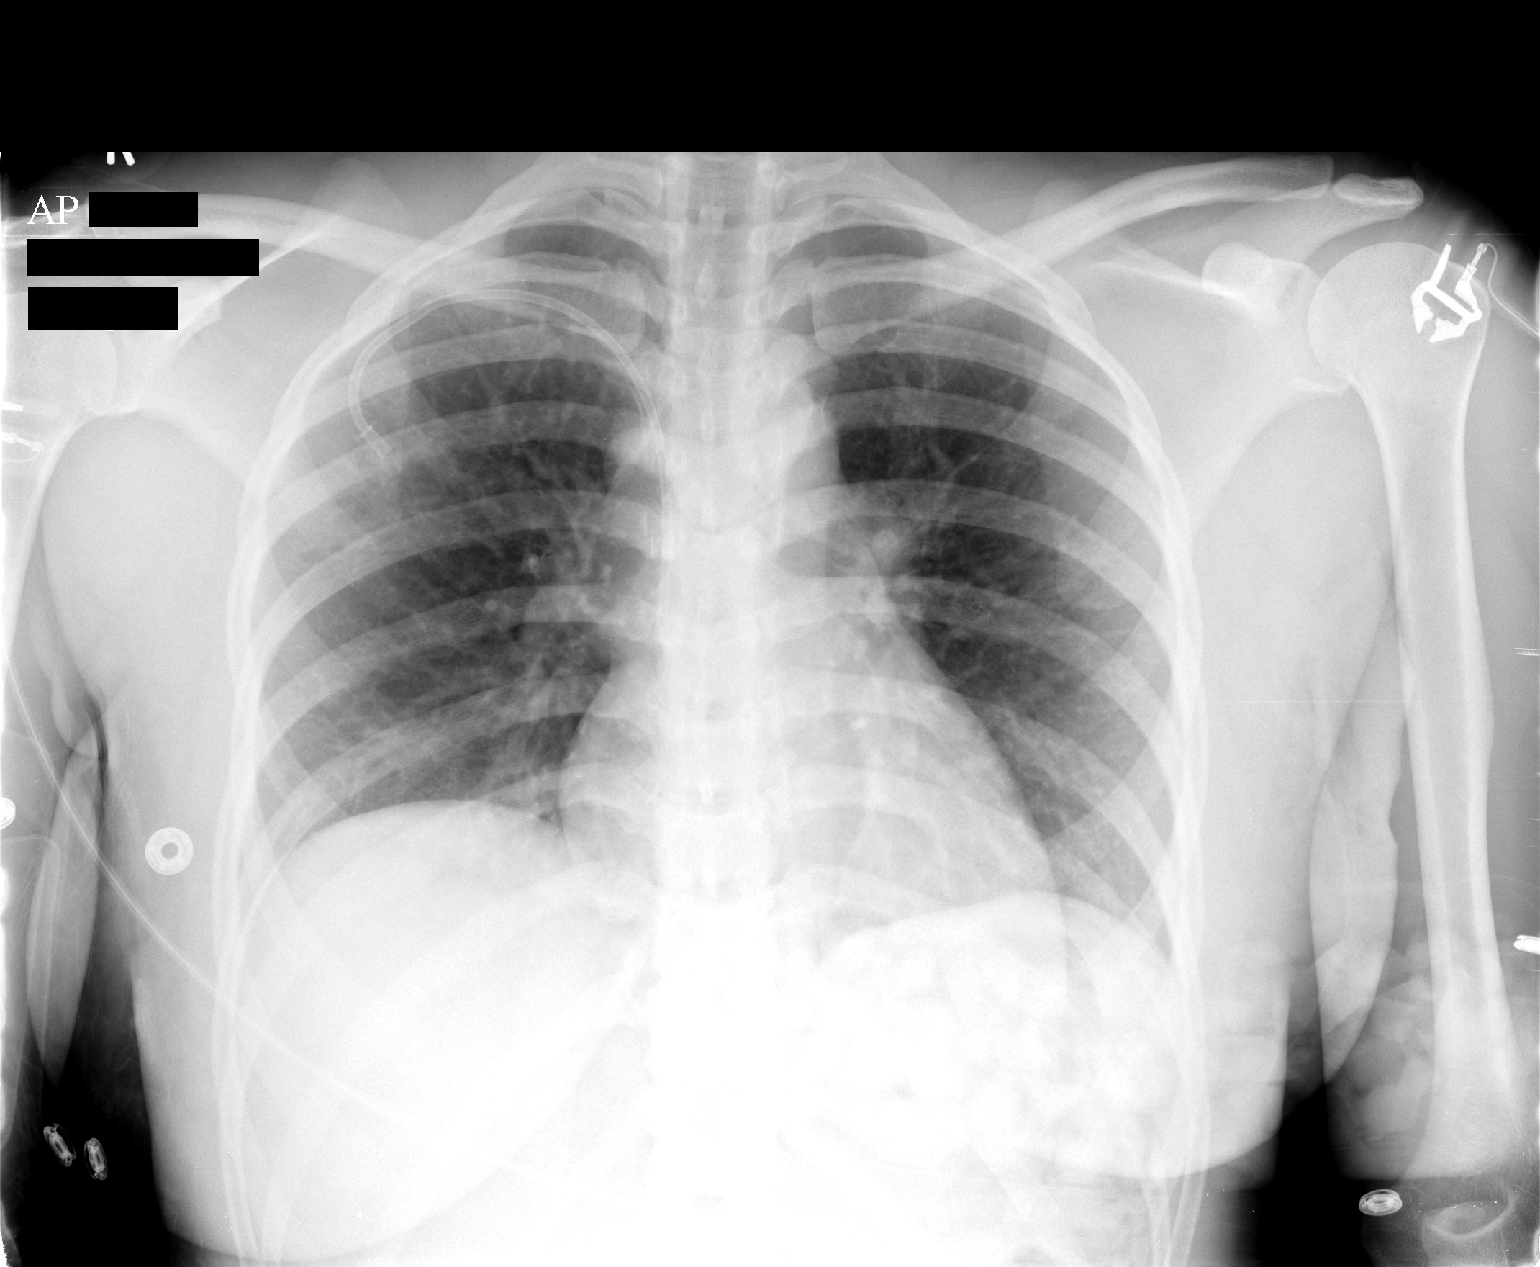

[1 of 1 positions shown; findings below may reference images not displayed]

FINDINGS: Right subclavian Port-A-Cath tip projects over the SVC.
No pneumothorax.  Heart size normal.  Lungs are somewhat low in
volume with minimal bibasilar atelectasis.  No pleural fluid.
IMPRESSION: 1.  Right subclavian Port-A-Cath insertion without complicating
feature.
2.  Mildly low lung volumes with mild bibasilar atelectasis.

## 2012-04-05 ENCOUNTER — Ambulatory Visit (INDEPENDENT_AMBULATORY_CARE_PROVIDER_SITE_OTHER): Payer: Self-pay | Admitting: Family Medicine

## 2012-04-05 ENCOUNTER — Encounter: Payer: Self-pay | Admitting: Family Medicine

## 2012-04-05 VITALS — BP 139/88 | HR 71 | Temp 98.2°F | Ht 63.0 in | Wt 162.0 lb

## 2012-04-05 DIAGNOSIS — IMO0001 Reserved for inherently not codable concepts without codable children: Secondary | ICD-10-CM

## 2012-04-05 DIAGNOSIS — C50919 Malignant neoplasm of unspecified site of unspecified female breast: Secondary | ICD-10-CM

## 2012-04-05 DIAGNOSIS — G43909 Migraine, unspecified, not intractable, without status migrainosus: Secondary | ICD-10-CM

## 2012-04-05 DIAGNOSIS — M797 Fibromyalgia: Secondary | ICD-10-CM

## 2012-04-05 MED ORDER — PROPRANOLOL HCL 20 MG PO TABS: 20.0000 mg | ORAL_TABLET | Freq: Two times a day (BID) | ORAL | Status: DC

## 2012-04-05 MED ORDER — GABAPENTIN 300 MG PO CAPS
300.0000 mg | ORAL_CAPSULE | Freq: Every day | ORAL | Status: DC
Start: 2012-04-05 — End: 2012-08-22

## 2012-04-05 NOTE — Patient Instructions (Signed)
Please taper your Elavil to 50 mg once a day for a week and then stop Please start propranolol 20 mg twice a day Please come back and see me in one week to make your blood pressure is okay on this medicine  I have refilled her Neurontin

## 2012-04-05 NOTE — Assessment & Plan Note (Signed)
Likely just a worsening of her chronic migraine condition but considering her recurrent breast cancer, will send for MRI. MRI scheduled for Monday. Switch from Elavil to propranolol. Will recheck her in one week.

## 2012-04-05 NOTE — Progress Notes (Signed)
  Subjective:    Patient ID: Victoria Delacruz, female    DOB: 1970-11-13, 41 y.o.   MRN: [id number]  HPI Migraine-patient has had long-term migraines and she is taking Elavil for prophylaxis. The last several weeks she's had more frequent migraines. The last one week she has had a migraine every day. She's taking Excedrin which helps some. She takes it about every other day. She's been checking her blood pressure and the diastolic is a little bit high in the 90s at home. She denies vision changes, weakness, numbness. She states that these headaches are the same in character to her typical headaches but she is not usually get them this frequently or in this intensely.   Review of Systems See above    Objective:   Physical Exam  Vital signs reviewed General appearance - alert, well appearing, and in no distress Heart - normal rate, regular rhythm, normal S1, S2, no murmurs, rubs, clicks or gallops Chest - clear to auscultation, no wheezes, rales or rhonchi, symmetric air entry, no tachypnea, retractions or cyanosis Neurological - alert, oriented, normal speech, no focal findings or movement disorder noted, screening mental status exam normal, neck supple without rigidity, cranial nerves II through XII intact Gait normal      Assessment & Plan:

## 2012-04-06 NOTE — Progress Notes (Signed)
Addended by: Jone Baseman D on: 04/06/2012 11:26 AM   Modules accepted: Orders

## 2012-04-10 ENCOUNTER — Ambulatory Visit (HOSPITAL_COMMUNITY)
Admission: RE | Admit: 2012-04-10 | Discharge: 2012-04-10 | Disposition: A | Discharge status: Home / Self Care | Payer: Self-pay | Source: Ambulatory Visit | Attending: Family Medicine | Admitting: Family Medicine

## 2012-04-10 DIAGNOSIS — C50919 Malignant neoplasm of unspecified site of unspecified female breast: Secondary | ICD-10-CM

## 2012-04-10 DIAGNOSIS — R51 Headache: Secondary | ICD-10-CM | POA: Insufficient documentation

## 2012-04-10 DIAGNOSIS — Z853 Personal history of malignant neoplasm of breast: Secondary | ICD-10-CM | POA: Insufficient documentation

## 2012-04-10 DIAGNOSIS — G43909 Migraine, unspecified, not intractable, without status migrainosus: Secondary | ICD-10-CM

## 2012-04-10 LAB — CREATININE, SERUM
Creatinine, Ser: 0.76 mg/dL (ref 0.50–1.10)
GFR calc Af Amer: >90 mL/min (ref >90)
GFR calc non Af Amer: >90 mL/min (ref >90)

## 2012-04-10 MED ORDER — GADOBENATE DIMEGLUMINE 529 MG/ML IV SOLN
15.0000 mL | Freq: Once | INTRAVENOUS | Status: AC | PRN
  Administered 2012-04-10: 15 mL via INTRAVENOUS

## 2012-04-11 ENCOUNTER — Telehealth: Payer: Self-pay | Admitting: Family Medicine

## 2012-04-11 NOTE — Telephone Encounter (Signed)
Called pt to tell her normal MRI.  Her HA are about the same but she is also having tooth ache and has an appt for extraction tomorrow.  Advised her to follow up after tooth removal.

## 2012-04-12 ENCOUNTER — Encounter (INDEPENDENT_AMBULATORY_CARE_PROVIDER_SITE_OTHER): Payer: Self-pay | Admitting: General Surgery

## 2012-04-12 ENCOUNTER — Telehealth: Payer: Self-pay | Admitting: Oncology

## 2012-04-12 ENCOUNTER — Ambulatory Visit: Payer: Self-pay | Admitting: Family Medicine

## 2012-04-12 NOTE — Telephone Encounter (Signed)
called pt and moved down her appt to allow for a tx pt,pt aware   aom

## 2012-04-17 ENCOUNTER — Other Ambulatory Visit: Payer: Self-pay | Admitting: Oncology

## 2012-04-17 DIAGNOSIS — Z1231 Encounter for screening mammogram for malignant neoplasm of breast: Secondary | ICD-10-CM

## 2012-04-17 DIAGNOSIS — Z9012 Acquired absence of left breast and nipple: Secondary | ICD-10-CM

## 2012-04-24 ENCOUNTER — Telehealth: Payer: Self-pay | Admitting: Oncology

## 2012-04-24 ENCOUNTER — Other Ambulatory Visit: Payer: Self-pay | Admitting: Lab

## 2012-04-24 ENCOUNTER — Ambulatory Visit: Payer: Self-pay | Admitting: Oncology

## 2012-04-24 NOTE — Telephone Encounter (Signed)
pt called and r/s appt to sara for 05/08/12  aom

## 2012-05-08 ENCOUNTER — Other Ambulatory Visit (HOSPITAL_BASED_OUTPATIENT_CLINIC_OR_DEPARTMENT_OTHER): Payer: Self-pay

## 2012-05-08 ENCOUNTER — Ambulatory Visit (HOSPITAL_BASED_OUTPATIENT_CLINIC_OR_DEPARTMENT_OTHER): Payer: Self-pay

## 2012-05-08 ENCOUNTER — Ambulatory Visit (HOSPITAL_BASED_OUTPATIENT_CLINIC_OR_DEPARTMENT_OTHER): Payer: Self-pay | Admitting: Physician Assistant

## 2012-05-08 VITALS — BP 146/90 | HR 62 | Temp 98.7°F

## 2012-05-08 VITALS — BP 131/91 | HR 65 | Temp 97.2°F | Ht 63.0 in | Wt 161.7 lb

## 2012-05-08 DIAGNOSIS — Z9010 Acquired absence of unspecified breast and nipple: Secondary | ICD-10-CM

## 2012-05-08 DIAGNOSIS — C50419 Malignant neoplasm of upper-outer quadrant of unspecified female breast: Secondary | ICD-10-CM

## 2012-05-08 DIAGNOSIS — Z853 Personal history of malignant neoplasm of breast: Secondary | ICD-10-CM

## 2012-05-08 DIAGNOSIS — Z170 Estrogen receptor positive status [ER+]: Secondary | ICD-10-CM

## 2012-05-08 DIAGNOSIS — C50919 Malignant neoplasm of unspecified site of unspecified female breast: Secondary | ICD-10-CM

## 2012-05-08 DIAGNOSIS — D509 Iron deficiency anemia, unspecified: Secondary | ICD-10-CM

## 2012-05-08 LAB — CBC WITH DIFFERENTIAL/PLATELET
BASO%: 0.8 % (ref 0.0–2.0)
Basophils Absolute: 0 10*3/uL (ref 0.0–0.1)
EOS%: 1.1 % (ref 0.0–7.0)
Eosinophils Absolute: 0.1 10*3/uL (ref 0.0–0.5)
HCT: 36.3 % (ref 34.8–46.6)
HGB: 12.4 g/dL (ref 11.6–15.9)
LYMPH%: 29.8 % (ref 14.0–49.7)
MCH: 32.5 pg (ref 25.1–34.0)
MCHC: 34.2 g/dL (ref 31.5–36.0)
MCV: 95.1 fL (ref 79.5–101.0)
MONO#: 0.3 10*3/uL (ref 0.1–0.9)
MONO%: 5.3 % (ref 0.0–14.0)
NEUT#: 3.8 10*3/uL (ref 1.5–6.5)
NEUT%: 63 % (ref 38.4–76.8)
Platelets: 233 10*3/uL (ref 145–400)
RBC: 3.82 10*6/uL (ref 3.70–5.45)
RDW: 13.8 % (ref 11.2–14.5)
WBC: 6.1 10*3/uL (ref 3.9–10.3)
lymph#: 1.8 10*3/uL (ref 0.9–3.3)

## 2012-05-08 LAB — COMPREHENSIVE METABOLIC PANEL
ALT: 9 U/L (ref 0–35)
AST: 19 U/L (ref 0–37)
Albumin: 4 g/dL (ref 3.5–5.2)
Alkaline Phosphatase: 69 U/L (ref 39–117)
BUN: 11 mg/dL (ref 6–23)
CO2: 22 mEq/L (ref 19–32)
Calcium: 8.8 mg/dL (ref 8.4–10.5)
Chloride: 108 mEq/L (ref 96–112)
Creatinine, Ser: 0.75 mg/dL (ref 0.50–1.10)
Glucose, Bld: 85 mg/dL (ref 70–99)
Potassium: 4 mEq/L (ref 3.5–5.3)
Sodium: 139 mEq/L (ref 135–145)
Total Bilirubin: 0.5 mg/dL (ref 0.3–1.2)
Total Protein: 7 g/dL (ref 6.0–8.3)

## 2012-05-08 LAB — LACTATE DEHYDROGENASE: LDH: 154 U/L (ref 94–250)

## 2012-05-08 MED ORDER — HEPARIN SOD (PORK) LOCK FLUSH 100 UNIT/ML IV SOLN
500.0000 [IU] | Freq: Once | INTRAVENOUS | Status: AC
  Administered 2012-05-08: 500 [IU] via INTRAVENOUS
  Filled 2012-05-08: qty 5

## 2012-05-08 MED ORDER — SODIUM CHLORIDE 0.9 % IJ SOLN
10.0000 mL | INTRAMUSCULAR | Status: DC | PRN
  Administered 2012-05-08: 10 mL via INTRAVENOUS
  Filled 2012-05-08: qty 10

## 2012-05-08 NOTE — Progress Notes (Signed)
[facility] OFFICE PROGRESS NOTE  Victoria Curia, MD CC: Victoria Plunk, MD  Victoria Delacruz. Victoria Delacruz, M.D.  Victoria Deeds, MD  Victoria Delacruz. Victoria Delacruz, M.D.   PROBLEM LIST:   1. 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 Tumor was 4.0 cm. Sentinel lymph nodes were negative. Estrogen receptor was 24%, progesterone receptor 2%. The patient underwent a saline implant.  2. Invasive duct carcinoma involving the residual left breast tissue in July 2011. The patient had a 6.0 mm subdermal recurrence in the upper outer quadrant of the left breast at about the 1-2 o'clock position. She underwent local excision of the tumor on 05/28/2010 followed by 4 cycles of adjuvant chemotherapy with Cytoxan and Taxotere in combination with Neulasta from 06/08/2010 through 08/10/2010. Radiation treatments to the left breast and chest wall were given from 09/08/2010 through 11/04/2010 consisting of 4780 cGy in 26 fractions with an 1800 cGy boost in 9 fractions. She took tamoxifen sporadically for awhile, then started having vaginal bleeding and tamoxifen was ultimately discontinued in June of 2012. ER 99%, PR 53% and Her2 was negative with a ratio of 1.17.  3. Laparoscopic assisted vaginal hysterectomy 08/30/2011.  4. History of iron-deficiency anemia secondary to heavy periods and possibly fibroids treated with intravenous iron in the past.  5. Irritable bowel syndrome.  6. History of hypertension.  7. Family history of breast cancer, but negative genetic testing.  8. Fibromyalgia diagnosed in the fall 2011.  9. Right-sided Port-A-Cath placed on 05/28/2010, currently being maintained with heparin flushes every 2 months. Her last flush was performed today. 05/08/2012    INTERVAL HISTORY [name] returns  today for followup of her recurrent invasive ductal carcinoma involving the residual left breast after the patient  had undergone mastectomy and immediate saline reconstruction for high-grade DCIS in October 2004. The invasive cancer was detected in July 2011. Victoria Delacruz was last seen by [place] on 02/22/2012. There has been no change in her condition. We have been maintaining her Port-A-Cath with heparin flushes. She is still having some symptoms of her fibromyalgia, especially when trying to exercise..  This particularly affects her hands with some swelling and stiffness. She is due to visit her rheumatologist sometime in the next 2 months.   She has no symptoms to suggest recurrent breast cancer, and no complaints today. In the recent past she had new migraine headaches, with negative MRI of the brain on 6/10. Her headaches are now controlled, and followed by her PCP. Her last CBC on 4/23  showed white count 6.3, ANC 4.4, hemoglobin 12.7, hematocrit 37.2, platelets 237,000. New CBC is essentially normal with results shown below. Chemistries are pending. LDH  Was 179.     MEDICAL HISTORY: Past Medical History  Diagnosis Date  . ECTOPIC PREGNANCY 1997 and 2011    x 2  . Hypertension   . Fibromyalgia   . Fibroid   . Breast cancer 2004 and 2011  . Anemia   . Arthritis   . Dizziness   . Family history of breast cancer     grandmother  . Blood transfusion 05/17/11, 05/25/11    anemia  . Depression     SURGICAL HISTORY:  Past Surgical History  Procedure Date  . Laparoscopy for ectopic pregnancy   . Laparoscopy w/ mini-laparotomy   . Reconstruction breast immediate / delayed w/ tissue expander 2004  . Mastectomy 2004    complete with reconstruction x 3,  no b/p punctures to left arm  . Laparoscopic assisted vaginal hysterectomy 08/30/2011    Procedure: LAPAROSCOPIC ASSISTED VAGINAL HYSTERECTOMY;  Surgeon: Tereso Newcomer, MD;  Location: [facility];  Service: Gynecology;  Laterality: N/A;    MEDICATIONS: Current Outpatient Prescriptions  Medication Sig Dispense Refill  . amitriptyline (ELAVIL) 50 MG tablet  Take 50 mg by mouth 2 (two) times daily.        Marland Kitchen gabapentin (NEURONTIN) 300 MG capsule Take 1 capsule (300 mg total) by mouth at bedtime.  30 capsule  11  . hydrochlorothiazide (,MICROZIDE/HYDRODIURIL,) 12.5 MG capsule Take 1 capsule (12.5 mg total) by mouth daily.  30 capsule  11  . potassium chloride SA (K-DUR,KLOR-CON) 20 MEQ tablet Take 20 mEq by mouth 2 (two) times daily.      . propranolol (INDERAL) 20 MG tablet Take 1 tablet (20 mg total) by mouth 2 (two) times daily.  60 tablet  11  . DISCONTD: amitriptyline (ELAVIL) 50 MG tablet Take 1 tablet (50 mg total) by mouth at bedtime. Take one tab at night for 1 week.  May increase to 2 tabs at night if tolerated.  60 tablet  2   No current facility-administered medications for this visit.   Facility-Administered Medications Ordered in Other Visits  Medication Dose Route Frequency Provider Last Rate Last Dose  . heparin lock flush 100 unit/mL  500 Units Intravenous Once Victoria Dell, MD   500 Units at 05/08/12 [phone]  . DISCONTD: sodium chloride 0.9 % injection 10 mL  10 mL Intravenous PRN Victoria Dell, MD   10 mL at 05/08/12 0843    ALLERGIES:   has no known allergies.  REVIEW OF SYSTEMS:  The rest of the 14-point review of system was negative.   Filed Vitals:   05/08/12 0902  BP: 131/91  Pulse: 65  Temp: 97.2 F (36.2 C)   Wt Readings from Last 3 Encounters:  05/08/12 161 lb 11.2 oz (73.347 kg)  04/05/12 162 lb (73.483 kg)  02/22/12 161 lb 8 oz (73.256 kg)      PHYSICAL EXAMINATION:  She looks well. Weight is stable at 161 pounds, height 5 feet 3 inches There is no scleral icterus. Mouth and pharynx are benign. No peripheral adenopathy palpable. Heart and lungs were normal. She does have a right-sided Port-A-Cath that was flushed with heparin today. This was placed on 05/28/2010. The right breast is somewhat pendulous and has undergone some reconstructive surgery. Scars are well healed. There are no suspicious findings.  On the left, the patient has undergone mastectomy and a saline implant. The area of her scar in the upper outer quadrant looks fine. Laterally at about the 2-3 o'clock position of the breast is an area of firmness or induration. I cannot be sure if this was rib or some scar tissue.  What is reassuring is that the patient did undergo mammogram and ultrasound of the left breast on January 7th of this year and that was  Negative.She is due for another mammogram on 7/23 which is to further delineate the area. Abdomen: Benign with no organomegaly or masses palpable. Extremities: No peripheral edema. No obvious lymphedema of the left arm. The patient has no palm erythema or puffiness on her hands Today. Neurologic: Grossly normal.      LABORATORY/RADIOLOGY DATA:   Lab 05/08/12 0815  WBC 6.1  HGB 12.4  HCT 36.3  PLT 233  MCV 95.1  MCH 32.5  MCHC 34.2  RDW 13.8  LYMPHSABS  1.8  MONOABS 0.3  EOSABS 0.1  BASOSABS 0.0  BANDABS --    CMP   No results found for this basename: NA:5,K:5,CL:5,CO2:5,GLUCOSE:5,BUN:5,CREATININE:5,GFRCGP,:5,CALCIUM:5,MG:5,AST:5,ALT:5,ALKPHOS:5,BILITOT:5 in the last 168 hours      Component Value Date/Time   BILITOT 0.4 02/22/2012 1004     Radiology Studies:  Mr Laqueta Jean RU Contrast  04/10/2012  *RADIOLOGY REPORT*  Clinical Data: History of breast cancer.  Headache.  MRI HEAD WITHOUT AND WITH CONTRAST   Comparison: Head CT 02/25/2008.  MRI 11/13/2005.  Findings: The brain has a normal appearance on all pulse sequences without evidence of malformation, atrophy, old or acute infarction, mass lesion, hemorrhage, hydrocephalus or extra-axial collection. No pituitary mass.  Sinuses, middle ears and mastoids are clear. No skull or skull base lesion is seen.  IMPRESSION: Normal examination.  No cause of headache is identified.  Original Report Authenticated By: Thomasenia Sales, M.D.    OTHER IMAGING STUDIES:  1. Digital screening mammogram of the right breast was  negative on  05/21/2011.  2. Chest x-ray, 2 view, from 06/10/2011 was negative.  3. Digital diagnostic left mammogram on 11/08/2011 showed no evidence  of left breast recurrence. It was noted that the patient is due  for right screening mammogram in July 2013. Ultrasound of the left  breast showed normal skin and fat. No discrete mass was seen.        ASSESSMENT AND PLAN:  Lysha seems to be doing well,2 years from the time of diagnosis of her invasive cancer. She is on no medicine at this time. I should mention that her estrogen receptor on the invasive was 99%, progesterone receptor 53%, HER-2/neu was negative with a ratio of 1.17. We will keep close watch of the lateral aspect of the left breast.Eulene is due for screening mammogram of her right breast on May 23, 2012 and a chest x-ray as well. We will plan to see Jalyah again in 2 months at which time we will check CBC and chemistries. She will be due for a Port-A-Cath flush at  that time.

## 2012-05-22 ENCOUNTER — Ambulatory Visit: Payer: Self-pay

## 2012-06-09 ENCOUNTER — Ambulatory Visit (INDEPENDENT_AMBULATORY_CARE_PROVIDER_SITE_OTHER): Payer: Self-pay | Admitting: Family Medicine

## 2012-06-09 ENCOUNTER — Encounter: Payer: Self-pay | Admitting: Family Medicine

## 2012-06-09 ENCOUNTER — Ambulatory Visit: Payer: Self-pay

## 2012-06-09 VITALS — BP 107/76 | HR 88 | Temp 98.8°F | Ht 63.0 in | Wt 156.0 lb

## 2012-06-09 DIAGNOSIS — J069 Acute upper respiratory infection, unspecified: Secondary | ICD-10-CM | POA: Insufficient documentation

## 2012-06-09 NOTE — Patient Instructions (Signed)
It was nice to meet you.  I think you have virus.  The good news is, I don't see anything that makes me think you need an antibiotic. Keep drinking plenty of fluids.  You can use over the counter cold/fly stuff, including a cough suppressant.  I would want you to come back if you start getting better, then suddenly get worse again, or if you are still having lots of sinus pressure in another week.    Upper Respiratory Infection, Adult An upper respiratory infection (URI) is also sometimes known as the common cold. The upper respiratory tract includes the nose, sinuses, throat, trachea, and bronchi. Bronchi are the airways leading to the lungs. Most people improve within 1 week, but symptoms can last up to 2 weeks. A residual cough may last even longer.  CAUSES Many different viruses can infect the tissues lining the upper respiratory tract. The tissues become irritated and inflamed and often become very moist. Mucus production is also common. A cold is contagious. You can easily spread the virus to others by oral contact. This includes kissing, sharing a glass, coughing, or sneezing. Touching your mouth or nose and then touching a surface, which is then touched by another person, can also spread the virus. SYMPTOMS  Symptoms typically develop 1 to 3 days after you come in contact with a cold virus. Symptoms vary from person to person. They may include:  Runny nose.   Sneezing.   Nasal congestion.   Sinus irritation.   Sore throat.   Loss of voice (laryngitis).   Cough.   Fatigue.   Muscle aches.   Loss of appetite.   Headache.   Low-grade fever.  DIAGNOSIS  You might diagnose your own cold based on familiar symptoms, since most people get a cold 2 to 3 times a year. Your caregiver can confirm this based on your exam. Most importantly, your caregiver can check that your symptoms are not due to another disease such as strep throat, sinusitis, pneumonia, asthma, or epiglottitis.  Blood tests, throat tests, and X-rays are not necessary to diagnose a common cold, but they may sometimes be helpful in excluding other more serious diseases. Your caregiver will decide if any further tests are required. RISKS AND COMPLICATIONS  You may be at risk for a more severe case of the common cold if you smoke cigarettes, have chronic heart disease (such as heart failure) or lung disease (such as asthma), or if you have a weakened immune system. The very young and very old are also at risk for more serious infections. Bacterial sinusitis, middle ear infections, and bacterial pneumonia can complicate the common cold. The common cold can worsen asthma and chronic obstructive pulmonary disease (COPD). Sometimes, these complications can require emergency medical care and may be life-threatening. PREVENTION  The best way to protect against getting a cold is to practice good hygiene. Avoid oral or hand contact with people with cold symptoms. Wash your hands often if contact occurs. There is no clear evidence that vitamin C, vitamin E, echinacea, or exercise reduces the chance of developing a cold. However, it is always recommended to get plenty of rest and practice good nutrition. TREATMENT  Treatment is directed at relieving symptoms. There is no cure. Antibiotics are not effective, because the infection is caused by a virus, not by bacteria. Treatment may include:  Increased fluid intake. Sports drinks offer valuable electrolytes, sugars, and fluids.   Breathing heated mist or steam (vaporizer or shower).   Eating  chicken soup or other clear broths, and maintaining good nutrition.   Getting plenty of rest.   Using gargles or lozenges for comfort.   Controlling fevers with ibuprofen or acetaminophen as directed by your caregiver.   Increasing usage of your inhaler if you have asthma.  Zinc gel and zinc lozenges, taken in the first 24 hours of the common cold, can shorten the duration and  lessen the severity of symptoms. Pain medicines may help with fever, muscle aches, and throat pain. A variety of non-prescription medicines are available to treat congestion and runny nose. Your caregiver can make recommendations and may suggest nasal or lung inhalers for other symptoms.  HOME CARE INSTRUCTIONS   Only take over-the-counter or prescription medicines for pain, discomfort, or fever as directed by your caregiver.   Use a warm mist humidifier or inhale steam from a shower to increase air moisture. This may keep secretions moist and make it easier to breathe.   Drink enough water and fluids to keep your urine clear or pale yellow.   Rest as needed.   Return to work when your temperature has returned to normal or as your caregiver advises. You may need to stay home longer to avoid infecting others. You can also use a face mask and careful hand washing to prevent spread of the virus.  SEEK MEDICAL CARE IF:   After the first few days, you feel you are getting worse rather than better.   You need your caregiver's advice about medicines to control symptoms.   You develop chills, worsening shortness of breath, or brown or red sputum. These may be signs of pneumonia.   You develop yellow or brown nasal discharge or pain in the face, especially when you bend forward. These may be signs of sinusitis.   You develop a fever, swollen neck glands, pain with swallowing, or white areas in the back of your throat. These may be signs of strep throat.  SEEK IMMEDIATE MEDICAL CARE IF:   You have a fever.   You develop severe or persistent headache, ear pain, sinus pain, or chest pain.   You develop wheezing, a prolonged cough, cough up blood, or have a change in your usual mucus (if you have chronic lung disease).   You develop sore muscles or a stiff neck.  Document Released: 04/13/2001 Document Revised: 10/07/2011 Document Reviewed: 02/19/2011 [facility] Patient Information 2012 [place],  [place].

## 2012-06-09 NOTE — Assessment & Plan Note (Signed)
Appears to be resolving.  Symptomatic treatment.  Red flags for return discussed, including second sickening and/or sinus pressure/drainage last for another 7 days (total of 14 days would also warrant abx therapy). F/u PRN

## 2012-06-09 NOTE — Progress Notes (Signed)
S: Pt comes in today for cold-like symptoms, including cough and congestion.  Of note, pt has h/o breast cancer, now in remission.  Had port-a-cath flushed and Heme-Onc appt 1 month ago, which stated she remains without evidence of recurrence of her cancer; has been finished with treatments since 2012.  She has been having these symptoms since Saturday (6 days).  Started with bad headache and muscle aches, as well as cough with green sputum, fever, and chills.  Fever to 101 on Monday and 104 on Tuesday (3 days ago).  Very achy, unable to get out of bed until yesterday.  Is feeling better today.  Has tried Thera-Flu and Nyquil and drinking lots of fluids.  Still having some cough, sinus pressure, and headache today but overall feeling much better.  Headache is different than her usual migraines, and is only when she coughs.  Sinus pressure has improved-- no longer having pain with leaning forward like she was a few days ago.  No fevers for the past 2 days.    ROS: Per HPI  History  Smoking status  . Never Smoker   Smokeless tobacco  . Never Used    O:  Filed Vitals:   06/09/12 0848  BP: 107/76  Pulse: 88  Temp: 98.8 F (37.1 C)    Gen: NAD HEENT: MMM, PERRLA, no cervical LAD, mild pharyngeal erythema without exudate, TMs normal bilaterally, nasal mucosa mildly inflamed, no sinus TTP CV: RRR, no murmur Pulm: CTA bilat, no wheezes or crackles Ext: Warm, no edema, no rash   A/P: 41 y.o. female p/w resolving viral URI -See problem list -f/u PRN

## 2012-06-15 ENCOUNTER — Ambulatory Visit (INDEPENDENT_AMBULATORY_CARE_PROVIDER_SITE_OTHER): Payer: Self-pay | Admitting: General Surgery

## 2012-07-17 ENCOUNTER — Ambulatory Visit: Payer: Self-pay

## 2012-07-17 ENCOUNTER — Ambulatory Visit: Payer: Self-pay | Admitting: Lab

## 2012-07-17 ENCOUNTER — Other Ambulatory Visit (HOSPITAL_BASED_OUTPATIENT_CLINIC_OR_DEPARTMENT_OTHER): Payer: Self-pay | Admitting: Lab

## 2012-07-17 ENCOUNTER — Ambulatory Visit (HOSPITAL_BASED_OUTPATIENT_CLINIC_OR_DEPARTMENT_OTHER): Payer: Self-pay | Admitting: Oncology

## 2012-07-17 ENCOUNTER — Encounter: Payer: Self-pay | Admitting: Oncology

## 2012-07-17 VITALS — BP 136/92 | HR 62 | Temp 97.2°F | Resp 20 | Ht 63.0 in | Wt 159.6 lb

## 2012-07-17 DIAGNOSIS — C50419 Malignant neoplasm of upper-outer quadrant of unspecified female breast: Secondary | ICD-10-CM

## 2012-07-17 DIAGNOSIS — D509 Iron deficiency anemia, unspecified: Secondary | ICD-10-CM

## 2012-07-17 DIAGNOSIS — C50919 Malignant neoplasm of unspecified site of unspecified female breast: Secondary | ICD-10-CM

## 2012-07-17 DIAGNOSIS — IMO0001 Reserved for inherently not codable concepts without codable children: Secondary | ICD-10-CM

## 2012-07-17 LAB — COMPREHENSIVE METABOLIC PANEL (CC13)
ALT: 10 U/L (ref 0–55)
AST: 17 U/L (ref 5–34)
Albumin: 3.7 g/dL (ref 3.5–5.0)
Alkaline Phosphatase: 81 U/L (ref 40–150)
BUN: 11 mg/dL (ref 7.0–26.0)
CO2: 24 mEq/L (ref 22–29)
Calcium: 8.8 mg/dL (ref 8.4–10.4)
Chloride: 107 mEq/L (ref 98–107)
Creatinine: 0.8 mg/dL (ref 0.6–1.1)
Glucose: 96 mg/dl (ref 70–99)
Potassium: 3.7 mEq/L (ref 3.5–5.1)
Sodium: 138 mEq/L (ref 136–145)
Total Bilirubin: 0.5 mg/dL (ref 0.20–1.20)
Total Protein: 7.4 g/dL (ref 6.4–8.3)

## 2012-07-17 LAB — CBC WITH DIFFERENTIAL/PLATELET
BASO%: 1 % (ref 0.0–2.0)
Basophils Absolute: 0.1 10*3/uL (ref 0.0–0.1)
EOS%: 1.2 % (ref 0.0–7.0)
Eosinophils Absolute: 0.1 10*3/uL (ref 0.0–0.5)
HCT: 36.9 % (ref 34.8–46.6)
HGB: 12.5 g/dL (ref 11.6–15.9)
LYMPH%: 29.8 % (ref 14.0–49.7)
MCH: 32.2 pg (ref 25.1–34.0)
MCHC: 33.8 g/dL (ref 31.5–36.0)
MCV: 95.4 fL (ref 79.5–101.0)
MONO#: 0.3 10*3/uL (ref 0.1–0.9)
MONO%: 5.4 % (ref 0.0–14.0)
NEUT#: 3.8 10*3/uL (ref 1.5–6.5)
NEUT%: 62.6 % (ref 38.4–76.8)
Platelets: 248 10*3/uL (ref 145–400)
RBC: 3.87 10*6/uL (ref 3.70–5.45)
RDW: 13.7 % (ref 11.2–14.5)
WBC: 6.1 10*3/uL (ref 3.9–10.3)
lymph#: 1.8 10*3/uL (ref 0.9–3.3)

## 2012-07-17 LAB — LACTATE DEHYDROGENASE (CC13): LDH: 178 U/L (ref 125–220)

## 2012-07-17 NOTE — Progress Notes (Signed)
CC:   Ellery Plunk, MD Angelia Mould. Derrell Lolling, M.D. Alben Deeds, MD Yaakov Guthrie. [name], M.D.  PROBLEM LIST: 1. 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     Tumor was 4.0 cm.  Sentinel lymph nodes were negative.  Estrogen     receptor was 24%, progesterone receptor 2%.  The patient underwent     a saline implant. 2. 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  The patient had a 6.0 mm subdermal recurrence in the     upper outer quadrant of the left breast at about the 1-2 o'clock     position.  She underwent local excision of the tumor on 05/28/2010     followed by 4 cycles of adjuvant chemotherapy with Cytoxan and     Taxotere in combination with Neulasta from 06/08/2010 through     08/10/2010.  Radiation treatments to the left breast and chest wall     were given from 09/08/2010 through 11/04/2010 consisting of 4780     cGy in 26 fractions with an 1800 cGy boost in 9 fractions.  She     took tamoxifen sporadically for awhile, then started having vaginal     bleeding and tamoxifen was ultimately discontinued in June of 2012. 3. Laparoscopic assisted vaginal hysterectomy 08/30/2011. 4. History of iron-deficiency anemia secondary to heavy periods and     possibly fibroids treated with intravenous iron in the past. 5. Irritable bowel syndrome. 6. History of hypertension. 7. Family history of breast cancer, but negative genetic testing. 8. Right-sided Port-A-Cath placed on 05/28/2010, currently being     maintained with heparin flushes every 2 months. 9. Fibromyalgia diagnosed in the fall 2011. 10.Migraine headaches which started around 2000.  MEDICATIONS: 1. Amitriptyline 50 mg twice daily. 2. Neurontin 300 mg at bedtime. 3. Hydrochlorothiazide 12.5 mg daily. 4. Propranolol 20 mg twice daily.  SMOKING HISTORY:  The patient has never smoked  cigarettes.  HISTORY:  Victoria Delacruz was seen today for followup of her recurrent invasive ductal carcinoma involving the residual left breast after the patient had undergone mastectomy and immediate saline reconstruction for high-grade DCIS in October 2004.  The invasive cancer was detected in July 2011.  Alanta was last seen by [place] on 05/08/2012 and prior to that by me on 05/23/2012.  There has been no significant change in her condition.  She continues to be troubled with fibromyalgia involving her hands, also her legs.  She has swelling and stiffness in her hands, some stiffness in her legs.  Symptoms are more or less stable.  She also suffers from migraine headaches.  There are no symptoms to suggest recurrent breast cancer.  We have been maintaining her Port-A-Cath with heparin flushes every 2 months.  PHYSICAL EXAMINATION:  General:  Juhi looks well without any obvious changes.  Weight is 159.6 pounds, height 5 feet 3 inches, body surface area 1.79 sq m.  Vital Signs: Blood pressure 136/92.  Other vital signs are normal.  HEENT:  There is no scleral icterus.  Mouth and pharynx are benign.  Lymphatic:  There is no peripheral adenopathy palpable.  Heart and Lungs:  Normal.  She has a right-sided Port-A-Cath that was flushed with heparin today.  This was originally placed on 05/28/2010.  Breasts: Right breast is benign.  It has undergone some reconstructive surgery. Scars are well healed.  There are  no suspicious findings.  On the left side, the patient has undergone a mastectomy and saline implant.  The area of the scar in the upper outer quadrant looks fine.  There are some very mild areas of induration associated with the scar, but this feels benign.  This is rather superficial.  Abdomen:  Benign with no organomegaly or masses palpable.  Extremities:  No peripheral edema or clubbing.  No obvious lymphedema of the left arm.  No arthritic changes are noted today.  Neurologic  Exam:  Normal.  LABORATORY DATA:  Today, white count 6.1, ANC 3.8, hemoglobin 12.5 hematocrit 36.9, platelets 248,000.  Chemistries today are pending. Chemistries from 05/08/2012 were normal.  Apparently, the blood for the chemistries hemolyzed.  The patient will need to go back to the lab.  IMAGING STUDIES: 1. Digital screening mammogram of the right breast was negative on     05/21/2011. 2. Chest x-ray, 2 view, from 06/10/2011 was negative. 3. Digital diagnostic left mammogram on 11/08/2011 showed no evidence     of left breast recurrence.  It was noted that the patient is due     for right screening mammogram in July 2013.  Ultrasound of the left     breast showed normal skin and fat.  No discrete mass was seen. 4. MRI of the head with and without IV contrast from 04/10/2012 showed     a normal exam.  No cause of the patient's headache was identified.  IMPRESSION AND PLAN:  Dolorez continues to do well, now a little over 2 years from the time of diagnosis of her invasive cancer.  She is on no treatment for her breast cancer at this time.  She is 41 years old. Estrogen and progesterone receptors were positive, 99 and 53, respectively.  HER2/neu was negative.  Anija is due for a chest x-ray today.  She is also due for a screening mammogram of her right breast.  This was scheduled for July but had to be rescheduled apparently because of a death in the family.  I have urged [name] to go ahead and reschedule this.  She will need to return to the lab today to have her chemistries redrawn since there apparently was hemolysis.  We will continue to flush the Port-A-Cath with heparin every 2 months. We will plan to [name] again in 4 months, at which time we will check CBC and chemistries.  That will be mid January, and Doll will be due for her yearly digital diagnostic mammogram of the left breast/implant.    ______________________________ Samul Dada, M.D. DSM/MEDQ  D:   07/17/2012  T:  07/17/2012  Job:  [id number]

## 2012-07-17 NOTE — Progress Notes (Signed)
This office note has been dictated.  #[id number]

## 2012-07-18 ENCOUNTER — Ambulatory Visit (HOSPITAL_COMMUNITY)
Admission: RE | Admit: 2012-07-18 | Discharge: 2012-07-18 | Disposition: A | Discharge status: Home / Self Care | Payer: Self-pay | Source: Ambulatory Visit | Attending: Oncology | Admitting: Oncology

## 2012-07-18 DIAGNOSIS — C50919 Malignant neoplasm of unspecified site of unspecified female breast: Secondary | ICD-10-CM | POA: Insufficient documentation

## 2012-07-18 DIAGNOSIS — I10 Essential (primary) hypertension: Secondary | ICD-10-CM | POA: Insufficient documentation

## 2012-07-18 NOTE — Progress Notes (Signed)
Quick Note:  Please notify patient and call/fax these results to patient's doctors. ______ 

## 2012-07-21 ENCOUNTER — Telehealth: Payer: Self-pay | Admitting: Oncology

## 2012-07-21 NOTE — Telephone Encounter (Signed)
S.w. Pt and advised on nov and jan appts.

## 2012-07-26 ENCOUNTER — Other Ambulatory Visit: Payer: Self-pay | Admitting: Oncology

## 2012-07-26 ENCOUNTER — Other Ambulatory Visit: Payer: Self-pay | Admitting: Family Medicine

## 2012-07-26 DIAGNOSIS — Z1231 Encounter for screening mammogram for malignant neoplasm of breast: Secondary | ICD-10-CM

## 2012-07-26 DIAGNOSIS — Z9012 Acquired absence of left breast and nipple: Secondary | ICD-10-CM

## 2012-07-27 ENCOUNTER — Ambulatory Visit: Payer: Self-pay | Admitting: Family Medicine

## 2012-07-28 ENCOUNTER — Ambulatory Visit: Payer: Self-pay

## 2012-08-16 ENCOUNTER — Other Ambulatory Visit: Payer: Self-pay | Admitting: Oncology

## 2012-08-16 ENCOUNTER — Ambulatory Visit
Admission: RE | Admit: 2012-08-16 | Discharge: 2012-08-16 | Disposition: A | Discharge status: Home / Self Care | Payer: Self-pay | Source: Ambulatory Visit | Attending: Oncology | Admitting: Oncology

## 2012-08-16 DIAGNOSIS — Z1231 Encounter for screening mammogram for malignant neoplasm of breast: Secondary | ICD-10-CM

## 2012-08-16 DIAGNOSIS — Z9012 Acquired absence of left breast and nipple: Secondary | ICD-10-CM

## 2012-08-16 DIAGNOSIS — N6452 Nipple discharge: Secondary | ICD-10-CM

## 2012-08-16 DIAGNOSIS — N644 Mastodynia: Secondary | ICD-10-CM

## 2012-08-22 ENCOUNTER — Encounter: Payer: Self-pay | Admitting: Family Medicine

## 2012-08-22 ENCOUNTER — Ambulatory Visit (INDEPENDENT_AMBULATORY_CARE_PROVIDER_SITE_OTHER): Payer: Self-pay | Admitting: Family Medicine

## 2012-08-22 VITALS — BP 131/85 | HR 64 | Temp 98.3°F | Ht 63.0 in | Wt 159.0 lb

## 2012-08-22 DIAGNOSIS — IMO0001 Reserved for inherently not codable concepts without codable children: Secondary | ICD-10-CM

## 2012-08-22 DIAGNOSIS — R1011 Right upper quadrant pain: Secondary | ICD-10-CM | POA: Insufficient documentation

## 2012-08-22 DIAGNOSIS — Z23 Encounter for immunization: Secondary | ICD-10-CM

## 2012-08-22 DIAGNOSIS — M797 Fibromyalgia: Secondary | ICD-10-CM

## 2012-08-22 DIAGNOSIS — R109 Unspecified abdominal pain: Secondary | ICD-10-CM

## 2012-08-22 DIAGNOSIS — G43909 Migraine, unspecified, not intractable, without status migrainosus: Secondary | ICD-10-CM

## 2012-08-22 LAB — COMPREHENSIVE METABOLIC PANEL
ALT: 8 U/L (ref 0–35)
AST: 16 U/L (ref 0–37)
Albumin: 4.1 g/dL (ref 3.5–5.2)
Alkaline Phosphatase: 70 U/L (ref 39–117)
BUN: 11 mg/dL (ref 6–23)
CO2: 26 mEq/L (ref 19–32)
Calcium: 9.4 mg/dL (ref 8.4–10.5)
Chloride: 106 mEq/L (ref 96–112)
Creat: 0.81 mg/dL (ref 0.50–1.10)
Glucose, Bld: 93 mg/dL (ref 70–99)
Potassium: 3.7 mEq/L (ref 3.5–5.3)
Sodium: 138 mEq/L (ref 135–145)
Total Bilirubin: 0.8 mg/dL (ref 0.3–1.2)
Total Protein: 7.3 g/dL (ref 6.0–8.3)

## 2012-08-22 MED ORDER — GABAPENTIN 300 MG PO CAPS: 300.0000 mg | ORAL_CAPSULE | Freq: Every day | ORAL | Status: DC

## 2012-08-22 MED ORDER — HYDROCHLOROTHIAZIDE 12.5 MG PO CAPS: 12.5000 mg | ORAL_CAPSULE | Freq: Every day | ORAL | Status: DC

## 2012-08-22 NOTE — Patient Instructions (Addendum)
[name],   Thank you for coming in to see me today.   I am concerned that you are having symptoms of gallstones (biliary colic). For this possibly please do the following: 1. Go for right upper abdominal ultrasound.  2. Avoid fatty foods/triggers.  3. If you develop fever, persistent vomiting, severe pain seek immediate medical attention.  If you do have stones, the next step will be a referral to general surgery to discuss possible surgical options.   Dr. Armen Pickup   Biliary Colic  Biliary colic is a steady or irregular pain in the upper abdomen. It is usually under the right side of the rib cage. It happens when gallstones interfere with the normal flow of bile from the gallbladder. Bile is a liquid that helps to digest fats. Bile is made in the liver and stored in the gallbladder. When you eat a meal, bile passes from the gallbladder through the cystic duct and the common bile duct into the small intestine. There, it mixes with partially digested food. If a gallstone blocks either of these ducts, the normal flow of bile is blocked. The muscle cells in the bile duct contract forcefully to try to move the stone. This causes the pain of biliary colic.  SYMPTOMS   A person with biliary colic usually complains of pain in the upper abdomen. This pain can be:  In the center of the upper abdomen just below the breastbone.  In the upper-right part of the abdomen, near the gallbladder and liver.  Spread back toward the right shoulder blade.  Nausea and vomiting.  The pain usually occurs after eating.  Biliary colic is usually triggered by the digestive system's demand for bile. The demand for bile is high after fatty meals. Symptoms can also occur when a person who has been fasting suddenly eats a very large meal. Most episodes of biliary colic pass after 1 to 5 hours. After the most intense pain passes, your abdomen may continue to ache mildly for about 24 hours. DIAGNOSIS  After you  describe your symptoms, your caregiver will perform a physical exam. He or she will pay attention to the upper right portion of your belly (abdomen). This is the area of your liver and gallbladder. An ultrasound will help your caregiver look for gallstones. Specialized scans of the gallbladder may also be done. Blood tests may be done, especially if you have fever or if your pain persists. PREVENTION  Biliary colic can be prevented by controlling the risk factors for gallstones. Some of these risk factors, such as heredity, increasing age, and pregnancy are a normal part of life. Obesity and a high-fat diet are risk factors you can change through a healthy lifestyle. Women going through menopause who take hormone replacement therapy (estrogen) are also more likely to develop biliary colic. TREATMENT   Pain medication may be prescribed.  You may be encouraged to eat a fat-free diet.  If the first episode of biliary colic is severe, or episodes of colic keep retuning, surgery to remove the gallbladder (cholecystectomy) is usually recommended. This procedure can be done through small incisions using an instrument called a laparoscope. The procedure often requires a brief stay in the hospital. Some people can leave the hospital the same day. It is the most widely used treatment in people troubled by painful gallstones. It is effective and safe, with no complications in more than 90% of cases.  If surgery cannot be done, medication that dissolves gallstones may be used. This  medication is expensive and can take months or years to work. Only small stones will dissolve.  Rarely, medication to dissolve gallstones is combined with a procedure called shock-wave lithotripsy. This procedure uses carefully aimed shock waves to break up gallstones. In many people treated with this procedure, gallstones form again within a few years. PROGNOSIS  If gallstones block your cystic duct or common bile duct, you are at  risk for repeated episodes of biliary colic. There is also a 25% chance that you will develop a gallbladder infection(acute cholecystitis), or some other complication of gallstones within 10 to 20 years. If you have surgery, schedule it at a time that is convenient for you and at a time when you are not sick. HOME CARE INSTRUCTIONS   Drink plenty of clear fluids.  Avoid fatty, greasy or fried foods, or any foods that make your pain worse.  Take medications as directed. SEEK MEDICAL CARE IF:   You develop a fever over 100.5 F (38.1 C).  Your pain gets worse over time.  You develop nausea that prevents you from eating and drinking.  You develop vomiting. SEEK IMMEDIATE MEDICAL CARE IF:   You have continuous or severe belly (abdominal) pain which is not relieved with medications.  You develop nausea and vomiting which is not relieved with medications.  You have symptoms of biliary colic and you suddenly develop a fever and shaking chills. This may signal cholecystitis. Call your caregiver immediately.  You develop a yellow color to your skin or the white part of your eyes (jaundice). Document Released: 03/21/2006 Document Revised: 01/10/2012 Document Reviewed: 05/30/2008 [facility] Patient Information 2013 [place], [place].

## 2012-08-23 ENCOUNTER — Other Ambulatory Visit: Payer: Self-pay | Admitting: Oncology

## 2012-08-23 ENCOUNTER — Ambulatory Visit
Admission: RE | Admit: 2012-08-23 | Discharge: 2012-08-23 | Disposition: A | Discharge status: Home / Self Care | Payer: Self-pay | Source: Ambulatory Visit | Attending: Oncology | Admitting: Oncology

## 2012-08-23 DIAGNOSIS — N6452 Nipple discharge: Secondary | ICD-10-CM

## 2012-08-23 DIAGNOSIS — N644 Mastodynia: Secondary | ICD-10-CM

## 2012-08-23 NOTE — Progress Notes (Signed)
Subjective:     Patient ID: Victoria Delacruz, female   DOB: 11-01-1971, 41 y.o.   MRN: [id number]  HPI 41 yo F with history of breast cancer x 2 and chronic pain presents for evaluation of 3 months of RUQ abdominal pain. . She admits to sharp pain in the RUQ that radiate to the epigastric area and the back. The pain last for seconds and resolves spontaneously. The pain is triggered by foods, but not necessarily fatty foods. She had one episode of emesis with pain. She had constipation at baseline. She denies fever, dysuria, pain currently, reflux, diarrhea.   Review of Systems As per HPI    Objective:   Physical Exam BP 131/85  Pulse 64  Temp 98.3 F (36.8 C) (Oral)  Ht 5\' 3"  (1.6 m)  Wt 159 lb (72.122 kg)  BMI 28.17 kg/m2  LMP 04/23/2011 General appearance: alert, cooperative and no distress Abdomen: mildly obese, no masses, mild TTP RUQ with mildly positive Murphy's sign. NABS.      Assessment and Plan:

## 2012-08-23 NOTE — Assessment & Plan Note (Addendum)
A: suspect biliary colic.  P: CMP ordered: wnl, no evidence of hepatobiliary disease.  Abdominal ultrasound ordered.  Refer to general surgery if gallstones on ultrasound or evidence of cholecystitis.

## 2012-08-24 ENCOUNTER — Ambulatory Visit (HOSPITAL_COMMUNITY)
Admission: RE | Admit: 2012-08-24 | Discharge: 2012-08-24 | Disposition: A | Discharge status: Home / Self Care | Payer: Self-pay | Source: Ambulatory Visit | Attending: Family Medicine | Admitting: Family Medicine

## 2012-08-24 DIAGNOSIS — K7689 Other specified diseases of liver: Secondary | ICD-10-CM | POA: Insufficient documentation

## 2012-08-24 DIAGNOSIS — R109 Unspecified abdominal pain: Secondary | ICD-10-CM | POA: Insufficient documentation

## 2012-08-25 ENCOUNTER — Telehealth: Payer: Self-pay | Admitting: Family Medicine

## 2012-08-25 NOTE — Telephone Encounter (Signed)
Left VM Normal abdominal ultrasound and normal CMP (no evidence of biliary stasis).  Next step if still symptomatic is HIDA scan. Can discuss this with PCP.   Asked patient to call back with questions or concerns regarding this.  Will route to patient's PCP.

## 2012-08-28 ENCOUNTER — Telehealth: Payer: Self-pay | Admitting: Family Medicine

## 2012-08-28 DIAGNOSIS — R1011 Right upper quadrant pain: Secondary | ICD-10-CM

## 2012-08-28 NOTE — Telephone Encounter (Signed)
Patient returned call to Dr. Armen Pickup and would like Dr. Armen Pickup to call her back.

## 2012-08-29 NOTE — Telephone Encounter (Signed)
Spoke to patient over the phone.  She is still having RUQ pain.  RUQ ultrasound normal. Labs normal.   Plan: HIDA scan ordered.  Patient will await details regarding study.

## 2012-08-30 NOTE — Telephone Encounter (Signed)
Appointment scheduled for 11/7 at 10am at [facility], patient informed.

## 2012-08-30 NOTE — Telephone Encounter (Signed)
Exam ordered incorrectly, re-ordered and left message with Sheralyn Boatman in radiology to to call me back to schedule patient.

## 2012-08-30 NOTE — Addendum Note (Signed)
Addended by: Garen Grams F on: 08/30/2012 11:48 AM   Modules accepted: Orders

## 2012-09-01 ENCOUNTER — Telehealth: Payer: Self-pay | Admitting: Family Medicine

## 2012-09-01 NOTE — Telephone Encounter (Signed)
Patient is calling for a Referral for [facility].  She would like to speak tot he nurse about how long this will take.

## 2012-09-01 NOTE — Telephone Encounter (Signed)
Spoke with Patient.  Advised that we will not be able to send the referral till December 1 as we are only allowed 10 a week.  She is ok with this and wants to go ahead and get it started.  Will forward to Dr. Benjamin Stain for referral placement.  Pt states that she will call if it get worse and thinks she needs an urgent referral. Aayden Cefalu, Maryjo [place]

## 2012-09-04 ENCOUNTER — Ambulatory Visit: Payer: Self-pay | Admitting: Family Medicine

## 2012-09-07 ENCOUNTER — Encounter (HOSPITAL_COMMUNITY)
Admission: RE | Admit: 2012-09-07 | Discharge: 2012-09-07 | Disposition: A | Discharge status: Home / Self Care | Payer: Self-pay | Source: Ambulatory Visit | Attending: Family Medicine | Admitting: Family Medicine

## 2012-09-07 ENCOUNTER — Other Ambulatory Visit: Payer: Self-pay | Admitting: Family Medicine

## 2012-09-07 DIAGNOSIS — R1011 Right upper quadrant pain: Secondary | ICD-10-CM | POA: Insufficient documentation

## 2012-09-07 DIAGNOSIS — K0889 Other specified disorders of teeth and supporting structures: Secondary | ICD-10-CM

## 2012-09-07 MED ORDER — TECHNETIUM TC 99M MEBROFENIN IV KIT
5.0000 | PACK | Freq: Once | INTRAVENOUS | Status: AC | PRN
  Administered 2012-09-07: 5 via INTRAVENOUS

## 2012-09-07 MED ORDER — SINCALIDE 5 MCG IJ SOLR
0.0200 ug/kg | Freq: Once | INTRAMUSCULAR | Status: AC
  Administered 2012-09-07: 13:00:00 via INTRAVENOUS

## 2012-09-07 NOTE — Progress Notes (Signed)
Patient had called front office requesting referral [facility].  No reason seen in request.  Placed order and notified RN.  Simone Curia 09/07/2012 5:04 PM

## 2012-09-11 ENCOUNTER — Ambulatory Visit (HOSPITAL_BASED_OUTPATIENT_CLINIC_OR_DEPARTMENT_OTHER): Payer: Self-pay

## 2012-09-11 VITALS — BP 142/90 | HR 74 | Temp 98.4°F

## 2012-09-11 DIAGNOSIS — C50419 Malignant neoplasm of upper-outer quadrant of unspecified female breast: Secondary | ICD-10-CM

## 2012-09-11 DIAGNOSIS — Z452 Encounter for adjustment and management of vascular access device: Secondary | ICD-10-CM

## 2012-09-11 DIAGNOSIS — C50919 Malignant neoplasm of unspecified site of unspecified female breast: Secondary | ICD-10-CM

## 2012-09-11 MED ORDER — SODIUM CHLORIDE 0.9 % IJ SOLN
10.0000 mL | INTRAMUSCULAR | Status: DC | PRN
  Administered 2012-09-11: 10 mL via INTRAVENOUS
  Filled 2012-09-11: qty 10

## 2012-09-11 MED ORDER — HEPARIN SOD (PORK) LOCK FLUSH 100 UNIT/ML IV SOLN
500.0000 [IU] | Freq: Once | INTRAVENOUS | Status: AC
  Administered 2012-09-11: 500 [IU] via INTRAVENOUS
  Filled 2012-09-11: qty 5

## 2012-09-14 ENCOUNTER — Telehealth: Payer: Self-pay | Admitting: Family Medicine

## 2012-09-14 NOTE — Telephone Encounter (Signed)
Pt had a nuclear test done the first of this week and has not heard any results - would like to know something tomorrow

## 2012-09-18 NOTE — Telephone Encounter (Signed)
I am unaware how to interpret these.  Must come from MD.  Will forward back to MD Fleeger, Maryjo Rochester

## 2012-09-18 NOTE — Telephone Encounter (Signed)
Patient has not heard back from anyone yet and she would like to hear from someone today.

## 2012-09-19 NOTE — Telephone Encounter (Signed)
Dr. Karie Schwalbe is in clinic this afternoon.  Will remind her when I see her. Kevante Lunt, Maryjo [place]

## 2012-09-19 NOTE — Telephone Encounter (Signed)
Pt is asking to be called this afternoon

## 2012-09-19 NOTE — Telephone Encounter (Signed)
Called and discussed results with patient.  Need to discuss with Dr Armen Pickup who ordered this test and then likely will need to call patient back and explain what is the next step for her abdominal pain.

## 2012-09-19 NOTE — Telephone Encounter (Signed)
Of note, pain has continued/worsened and is worse with food, so she has limited intake to once daily.  She is concerned about cancer with her history of breast cancer and significant family history of cancer.

## 2012-09-20 ENCOUNTER — Other Ambulatory Visit: Payer: Self-pay | Admitting: Family Medicine

## 2012-09-20 DIAGNOSIS — R109 Unspecified abdominal pain: Secondary | ICD-10-CM

## 2012-09-21 ENCOUNTER — Encounter: Payer: Self-pay | Admitting: Family Medicine

## 2012-09-21 DIAGNOSIS — R109 Unspecified abdominal pain: Secondary | ICD-10-CM | POA: Insufficient documentation

## 2012-09-21 NOTE — Telephone Encounter (Signed)
After reviewing the HIDA scan results with Dr. Armen Pickup and my previous phone conversation with Ms. Raul Del who states she is still having significant pain and is worried about cholecystitis vs a neoplasm component, as she has a significant personal and family history of cancer.    HIDA scan 09/07/12 results: IMPRESSION:  There is demonstration of patency of the common bile duct and the  cystic duct. There is no evidence of cholecystitis.  During CCK infusion the gallbladder contracted 75.1%. 30% or  greater is normal range.  During CCK infusion the patient reported experiencing abdominal  discomfort. **ADDENDUM** CREATED: 09/07/2012 14:02:04  Correction: The gallbladder began to visualize at 43 minutes. This  indicates patency of the cystic duct. Delayed visualization may be  associated with element of chronic cholecystitis.  Plan is to refer to general surgery for evaluation.  I called patient this morning and she is agreeable to this referral.  She has [employer] through [employer].   Simone Curia 09/21/2012 9:27 AM

## 2012-10-11 ENCOUNTER — Telehealth (INDEPENDENT_AMBULATORY_CARE_PROVIDER_SITE_OTHER): Payer: Self-pay | Admitting: General Surgery

## 2012-10-11 ENCOUNTER — Telehealth: Payer: Self-pay | Admitting: *Deleted

## 2012-10-11 NOTE — Telephone Encounter (Signed)
Called and left message on voicemail at [facility] requesting call back. Per message from Dr. Derrell Lolling 10/11/12:  "This patient is on my schedule for evaluation of cholecystitis. Her ultrasound is negative for gallstones or signs of cholecystitis. Her biliary scan is negative for obstruction and negative for evidence of cholecystitis. I am not sure why I am being asked to see this patient. Workup to date is nondiagnostic. Please contact referring physician and suggest referral to gastroenterology first. They may call me as necessary."  This patient appointment on 10/16/12 will be cancelled based on the message received above.

## 2012-10-11 NOTE — Telephone Encounter (Addendum)
[facility] calling.  Patient has an appt with Dr. Derrell Lolling on 10/16/12 for cholecystitis.  Dr. Derrell Lolling has reviewed notes and recommends that patient have GI referral first due to "work-up not diagnostic; negative for gallstones."  They will not cancel appt yet.  Will route note to Dr. Briant Sites and call back to cancel appt if not needed per Dr. Jacinto Halim recommendation.  Please notify patient if appt is canceled.     Gaylene Brooks, RN

## 2012-10-13 ENCOUNTER — Telehealth (INDEPENDENT_AMBULATORY_CARE_PROVIDER_SITE_OTHER): Payer: Self-pay | Admitting: General Surgery

## 2012-10-13 NOTE — Telephone Encounter (Signed)
Called patient and advised the appointment to see Dr. Derrell Lolling on 10/16/12 at 10:00 was cancelled due to need for her to be referred to GI for further evaluation. I advised the patient that at this time based on Dr. Jacinto Halim review of the tests performed so far there is nothing surgically that can be done by him at this time. I advised the patient that she will be contacted by [facility] with the referral to GI. Patient agreed.

## 2012-10-14 ENCOUNTER — Other Ambulatory Visit: Payer: Self-pay | Admitting: Family Medicine

## 2012-10-14 DIAGNOSIS — R1011 Right upper quadrant pain: Secondary | ICD-10-CM

## 2012-10-14 NOTE — Telephone Encounter (Signed)
Ordered referral to GI - please advise if I need to revise.  Picked [facility] but unsure if per patient's insurance needed to pick other team. Thank you!

## 2012-10-14 NOTE — Telephone Encounter (Signed)
Ordered referral to GI and wrote letter to [facility], routed to team to send to them.  Please advise if I need to make any revisions.  Thank you.

## 2012-10-14 NOTE — Telephone Encounter (Signed)
Making referral for patient to be seen by GI given continued RUQ abdominal pain, limiting intake, and personal history of breast cancer.  Had referred to surgery with HIDA scan showing possible "Chronic cholecystitis" but they are recommending patient be seen by GI given normal abdominal US and HIDA scan that is otherwise WNL.  08/24/12 Abdominal US:  IMPRESSION:  1. No acute finding. Negative for gallstones.  2. Two echogenic hepatic lesions are likely hemangiomas. The  larger is seen on a prior study. As the stability of the second  lesion cannot be established, repeat ultrasound in 4-6 months is  recommended.   09/07/12 HIDA scan: IMPRESSION:  There is demonstration of patency of the common bile duct and the  cystic duct. There is no evidence of cholecystitis.  During CCK infusion the gallbladder contracted 75.1%. 30% or  greater is normal range.  During CCK infusion the patient reported experiencing abdominal  Discomfort. **ADDENDUM** CREATED: 09/07/2012 14:02:04  Correction: The gallbladder began to visualize at 43 minutes. This  indicates patency of the cystic duct. Delayed visualization may be  associated with element of chronic cholecystitis.

## 2012-10-16 ENCOUNTER — Encounter (INDEPENDENT_AMBULATORY_CARE_PROVIDER_SITE_OTHER): Payer: Self-pay | Admitting: General Surgery

## 2012-10-16 NOTE — Telephone Encounter (Signed)
Pt called to say that she has the orange card and needs something within [facility] and needs asap, she is in a lot of pain

## 2012-10-16 NOTE — Telephone Encounter (Signed)
There are no GI MDs that participate with the orange card in [place].  Called to inform pt.  Asked if she would be able to go to [place], but they would set up a payment plan just as they would here in [place].  Pt states that she has no income and would not be able to do that.  Pt ask if there are any other test.  Pt is clearly upset with the outcome, not rude or belligerent, but clearly upset.  Advised I would send message to MD to see what the next step would be. Fleeger, Victoria Delacruz

## 2012-10-17 NOTE — Telephone Encounter (Signed)
Patient has an appt on 10/16/12 and it was canceled per Dr. Jacinto Halim office.

## 2012-10-17 NOTE — Telephone Encounter (Addendum)
Called patient to discuss.  Patient understands limitations of Orange Card not covering GI referral.  She is still in lots of pain especially on left side, though it is difficult to pinpoint exact location due to fibromyalgia back pain and history of breast cancer.  Very concerned about cancer recurrence, and very frustrated that cannot pinpoint pain or get rid of it, feels like it is worsening her depression.  Told patient best option would be appointment at [facility] in 1-2 weeks with me, Dr. Armen Pickup, or if neither of [place] available, other provider, because need more history and physical details to think through diagnosis.  Assessment:  Acute pathology unlikely given duration of symptoms >2 months.  DDx includes gastritis, IBS, recurrence of cancer/liver metastasis, infection.  Cholecystitis/gall bladder pathology unlikely given negative abdominal US and HIDA scan.  Orange card does not cover GI referral.  Surgery referral recommended not referring patient due to negative abdominal US and HIDA but will see patient if we still want them to.  Plan:  Contacted blue team to call patient for appointment within 1-2 weeks Consider abdominal CT if appropriate based on information at next appointment, as orange card will cover.  Reminded her of option to come in for same-day visit or go to Emergency Visit if symptoms became concerning or unbearable.   Discussed the above plan with Dr. Deirdre Priest who is in agreement.  Simone Curia 10/17/2012 5:10 PM

## 2012-11-02 ENCOUNTER — Ambulatory Visit (INDEPENDENT_AMBULATORY_CARE_PROVIDER_SITE_OTHER): Payer: No Typology Code available for payment source | Admitting: Family Medicine

## 2012-11-02 VITALS — BP 121/78 | HR 69 | Temp 98.3°F | Ht 63.0 in | Wt 161.0 lb

## 2012-11-02 DIAGNOSIS — R109 Unspecified abdominal pain: Secondary | ICD-10-CM

## 2012-11-02 MED ORDER — OMEPRAZOLE 20 MG PO CPDR: 20.0000 mg | DELAYED_RELEASE_CAPSULE | Freq: Every day | ORAL | Status: DC

## 2012-11-02 NOTE — Progress Notes (Signed)
Subjective:     Patient ID: Victoria Delacruz, female   DOB: [birth year], 42 y.o.   MRN: [id number]  L-sided abdominal pain  HPI  Victoria Delacruz is a 42 y.o. AAF with h/o fibromyalgia and breast cancer here with intermittent LUQ abdominal pain since early October.  She has had normal AST/ALT, abdominal US, and HIDA scan since that time.  She hurts all the time due to fibromyalgia, ut feels like this pain is different, crampy and stabbing, 10/10 when present and occasionally disappears.  Worsened at night and by certain foods though she cannot articulate which, though she does feel that eating makes her feel bloated so she has limited meals to once daily.  Pain occasionally radiates to back and lower abdomen.  She feels very gassy and like she has to clear her throat a lot.  Weight is relatively stable.  She has occasional watery large-volume nonbloody diarrhea (twice weekly) and constipation in between.  Reports that she has h/o IBS and prior to October did not have regular stool pattern, though this feels worse than her usual IBS flares.  Occasional chills.  She has had night sweats since 2011 with hot flashes, which she thinks is from menopause.  Denies fevers, emesis, recent travel outside of the country, recent hiking trips, bug bites, new stressors, new medications, alcohol, tobacco, or drug use, palliating factors, dyspareunia, vaginal pain, bleeding or abnormal discharge, coughing up blood  Her health is a stressor for her.   Review of Systems Per HPI; otherwise normal  PMH, SH, and FH have been reviewed.  Pt reports hysterectomy after chemotherapy Oct 2012 caused abnormalities with heavy bleeding.       Objective:   Physical Exam BP 121/78  Pulse 69  Temp 98.3 F (36.8 C) (Oral)  Ht 5\' 3"  (1.6 m)  Wt 161 lb (73.029 kg)  BMI 28.52 kg/m2  LMP 04/23/2011 GEN: NAD, sitting on exam table, stable-appearing, well-nourished, well-groomed CV: RRR, no m/r/g ABD: LUQ moderate TTP but with  no rebound or guarding; no splenomegaly, no organomegaly; NABS SKIN: No rash, warm, well-perfused EXTR/MSK: Full and spontaneous movement NEURO: Alert, no focal deficits    Assessment:     Victoria Delacruz is a 42 y.o. AAF with h/o fibromyalgia and breast cancer here with intermittent LUQ abdominal pain since early October, which is unlikely to be acute due to duration of symptoms.    Plan:

## 2012-11-02 NOTE — Patient Instructions (Addendum)
Please get an abdominal x-ray at [facility] radiology department. Please take omeprazole daily.  This can take some time to help but may help with your symptoms. Please see me or a colleague after getting the xray and your medicine, in about 1-2 weeks.  If you have concerning symptoms, see me sooner or go to the ED.

## 2012-11-03 ENCOUNTER — Ambulatory Visit (HOSPITAL_COMMUNITY)
Admission: RE | Admit: 2012-11-03 | Discharge: 2012-11-03 | Disposition: A | Discharge status: Home / Self Care | Payer: No Typology Code available for payment source | Source: Ambulatory Visit | Attending: Family Medicine | Admitting: Family Medicine

## 2012-11-03 DIAGNOSIS — K5900 Constipation, unspecified: Secondary | ICD-10-CM | POA: Insufficient documentation

## 2012-11-03 DIAGNOSIS — R109 Unspecified abdominal pain: Secondary | ICD-10-CM | POA: Insufficient documentation

## 2012-11-04 ENCOUNTER — Encounter: Payer: Self-pay | Admitting: Family Medicine

## 2012-11-04 MED ORDER — OMEPRAZOLE 40 MG PO CPDR: 40.0000 mg | DELAYED_RELEASE_CAPSULE | Freq: Every day | ORAL | Status: DC

## 2012-11-04 MED ORDER — POLYETHYLENE GLYCOL 3350 17 GM/SCOOP PO POWD: 17.0000 g | Freq: Two times a day (BID) | ORAL | Status: DC | PRN

## 2012-11-04 NOTE — Assessment & Plan Note (Addendum)
DDx of LUQ in this patient includes gastritis, IBS, SBO v constipation, metastasis of breast cancer, infection.  Cholecystitis is unlikely due to negative prior imaging.  Constipation / IBS is very likely given patient's reported h/o IBS.   - Abdominal x-ray to evaluate for stool ball that patient is having liquid stool around: Miralax if positive stool burden - --  Moderate stool burden - Miralax BID prn -- - Omeprazole 40 mg daily - Follow-up in 1-2 weeks ** Addendum: Pt would like to just follow up ad lib based on her symptoms ** - If no improvement, consider CT abdomen to evaluate for metastasis or colonoscopy - **Addendum: Per oncology note 1/6, plans for CT abdomen in March - called pt and she is aware and agrees with this plan. **

## 2012-11-06 ENCOUNTER — Ambulatory Visit: Payer: No Typology Code available for payment source

## 2012-11-06 ENCOUNTER — Encounter: Payer: Self-pay | Admitting: Family

## 2012-11-06 ENCOUNTER — Telehealth: Payer: Self-pay | Admitting: Oncology

## 2012-11-06 ENCOUNTER — Other Ambulatory Visit (HOSPITAL_BASED_OUTPATIENT_CLINIC_OR_DEPARTMENT_OTHER): Payer: No Typology Code available for payment source | Admitting: Lab

## 2012-11-06 ENCOUNTER — Ambulatory Visit (HOSPITAL_BASED_OUTPATIENT_CLINIC_OR_DEPARTMENT_OTHER): Payer: No Typology Code available for payment source | Admitting: Family

## 2012-11-06 VITALS — BP 120/74 | HR 60 | Temp 97.7°F | Resp 20 | Ht 63.0 in | Wt 161.3 lb

## 2012-11-06 DIAGNOSIS — R1012 Left upper quadrant pain: Secondary | ICD-10-CM

## 2012-11-06 DIAGNOSIS — C50419 Malignant neoplasm of upper-outer quadrant of unspecified female breast: Secondary | ICD-10-CM

## 2012-11-06 DIAGNOSIS — C50919 Malignant neoplasm of unspecified site of unspecified female breast: Secondary | ICD-10-CM

## 2012-11-06 DIAGNOSIS — Z170 Estrogen receptor positive status [ER+]: Secondary | ICD-10-CM

## 2012-11-06 DIAGNOSIS — R1032 Left lower quadrant pain: Secondary | ICD-10-CM

## 2012-11-06 LAB — CBC WITH DIFFERENTIAL/PLATELET
BASO%: 0.7 % (ref 0.0–2.0)
Basophils Absolute: 0 10*3/uL (ref 0.0–0.1)
EOS%: 0.7 % (ref 0.0–7.0)
Eosinophils Absolute: 0.1 10*3/uL (ref 0.0–0.5)
HCT: 35.3 % (ref 34.8–46.6)
HGB: 12 g/dL (ref 11.6–15.9)
LYMPH%: 25 % (ref 14.0–49.7)
MCH: 31.9 pg (ref 25.1–34.0)
MCHC: 34.1 g/dL (ref 31.5–36.0)
MCV: 93.4 fL (ref 79.5–101.0)
MONO#: 0.3 10*3/uL (ref 0.1–0.9)
MONO%: 4.3 % (ref 0.0–14.0)
NEUT#: 5 10*3/uL (ref 1.5–6.5)
NEUT%: 69.3 % (ref 38.4–76.8)
Platelets: 238 10*3/uL (ref 145–400)
RBC: 3.78 10*6/uL (ref 3.70–5.45)
RDW: 13.3 % (ref 11.2–14.5)
WBC: 7.3 10*3/uL (ref 3.9–10.3)
lymph#: 1.8 10*3/uL (ref 0.9–3.3)

## 2012-11-06 LAB — COMPREHENSIVE METABOLIC PANEL (CC13)
ALT: 6 U/L (ref 0–55)
AST: 15 U/L (ref 5–34)
Albumin: 3.3 g/dL — ABNORMAL LOW (ref 3.5–5.0)
Alkaline Phosphatase: 72 U/L (ref 40–150)
BUN: 7 mg/dL (ref 7.0–26.0)
CO2: 22 mEq/L (ref 22–29)
Calcium: 8.9 mg/dL (ref 8.4–10.4)
Chloride: 108 mEq/L — ABNORMAL HIGH (ref 98–107)
Creatinine: 0.8 mg/dL (ref 0.6–1.1)
Glucose: 103 mg/dl — ABNORMAL HIGH (ref 70–99)
Potassium: 3.5 mEq/L (ref 3.5–5.1)
Sodium: 139 mEq/L (ref 136–145)
Total Bilirubin: 0.63 mg/dL (ref 0.20–1.20)
Total Protein: 7 g/dL (ref 6.4–8.3)

## 2012-11-06 LAB — LACTATE DEHYDROGENASE (CC13): LDH: 153 U/L (ref 125–245)

## 2012-11-06 MED ORDER — SODIUM CHLORIDE 0.9 % IJ SOLN
10.0000 mL | INTRAMUSCULAR | Status: DC | PRN
  Administered 2012-11-06: 10 mL via INTRAVENOUS
  Filled 2012-11-06: qty 10

## 2012-11-06 MED ORDER — HEPARIN SOD (PORK) LOCK FLUSH 100 UNIT/ML IV SOLN
500.0000 [IU] | Freq: Once | INTRAVENOUS | Status: AC
  Administered 2012-11-06: 500 [IU] via INTRAVENOUS
  Filled 2012-11-06: qty 5

## 2012-11-06 NOTE — Progress Notes (Signed)
Patient ID: Victoria Delacruz, female   DOB: 05-Mar-1971, 42 y.o.   MRN: [id number] CSN: [id number]  CC: Ellery Plunk, MD  Angelia Mould. Derrell Lolling, MD Alben Deeds, MD  Yaakov Guthrie. [name], MD Grandville Silos. Benjamin Stain, MD   Problem List: Victoria Delacruz is a 42 y.o. African-American female with a problem list consisting of:  1. 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 Tumor was 4.0 cm. Sentinel lymph nodes were negative. Estrogen receptor was 24%, progesterone receptor 2%. The patient underwent  a saline implant.  2. Invasive duct carcinoma involving the residual left breast tissue in July 2011. The patient had a 6.0 mm subdermal recurrence in the  upper outer quadrant of the left breast at about the 1-2 o'clock position. She underwent local excision of the tumor on 05/28/2010 followed by 4 cycles of adjuvant chemotherapy with Cytoxan and Taxotere in combination with Neulasta from 06/08/2010 through 08/10/2010. Radiation treatments to the left breast and chest wall were given from 09/08/2010 through 11/04/2010 consisting of 4780 cGy in 26 fractions with an 1800 cGy boost in 9 fractions. She took tamoxifen sporadically for awhile, then started having vaginal bleeding and tamoxifen was ultimately discontinued in June of 2012.  3. Laparoscopic assisted vaginal hysterectomy 08/30/2011.  4. History of iron-deficiency anemia secondary to heavy periods and possibly fibroids treated with intravenous iron in the past.  5. Irritable bowel syndrome 6. History of hypertension 7. Family history of breast cancer, but negative genetic testing.  8. Right-sided Port-A-Cath placed on 05/28/2010, currently being maintained with heparin flushes every 2 months.  9. Fibromyalgia diagnosed in the fall 2011.  10.Migraine headaches which started around 2000.  Dr. Arline Asp and I saw Victoria Delacruz  today for follow up of her recurrent  invasive ductal carcinoma involving the residual left breast after the patient had undergone mastectomy and immediate saline reconstruction for high-grade DCIS in October 2004. The invasive cancer was detected in July 2011.   Victoria Delacruz was last seen by [place] on 07/17/2012.  She reports ongoing right breast discomfort, soreness, clear nipple discharge and severe pruritis since her last office visit.  She had a right ductogram and a right unilateral mammogram in 08/2012 both of which were negative and/or showed no evidence of malignancy.  She also reports having abdominal pain in her LUQ and LLQ that has persisted since her last office visit.  She reports that her abdominal pain is aggravated when she eats and is worse at night.  Victoria Delacruz reports the pain as so severe at night, that she is awoken from her sleep with the pain.  She continues to be troubled with fibromyalgia involving her hands, also her legs. She has swelling and stiffness in her hands, some stiffness in her legs. She also suffers from migraine headaches.  Victoria Delacruz has had intermittent episodes of constipation and diarrhea. We have been maintaining her Port-A-Cath with heparin flushes every 2 months and she received a flush today.  Victoria Delacruz denies any other symptomatology.   Past Medical History: Past Medical History  Diagnosis Date  . ECTOPIC PREGNANCY 1997 and 2011    x 2  . Hypertension   . Fibromyalgia   . Fibroid   . Breast cancer 2004 and 2011  . Anemia   . Arthritis   . Dizziness   . Family history of breast cancer     grandmother  . Blood transfusion 05/17/11, 05/25/11  anemia  . Depression     Surgical History: Past Surgical History  Procedure Date  . Laparoscopy for ectopic pregnancy   . Laparoscopy w/ mini-laparotomy   . Reconstruction breast immediate / delayed w/ tissue expander 2004  . Mastectomy 2004    complete with reconstruction x 3, no b/p punctures to left arm  . Laparoscopic assisted vaginal  hysterectomy 08/30/2011    Procedure: LAPAROSCOPIC ASSISTED VAGINAL HYSTERECTOMY;  Surgeon: Tereso Newcomer, MD;  Location: [facility];  Service: Gynecology;  Laterality: N/A;    Current Medications: Current Outpatient Prescriptions  Medication Sig Dispense Refill  . gabapentin (NEURONTIN) 300 MG capsule Take 1 capsule (300 mg total) by mouth at bedtime.  30 capsule  11  . hydrochlorothiazide (MICROZIDE) 12.5 MG capsule Take 1 capsule (12.5 mg total) by mouth daily.  30 capsule  11  . omeprazole (PRILOSEC) 40 MG capsule Take 1 capsule (40 mg total) by mouth daily.  30 capsule  1  . polyethylene glycol powder (GLYCOLAX/MIRALAX) powder Take 17 g by mouth 2 (two) times daily as needed.  3350 g  1  . propranolol (INDERAL) 20 MG tablet Take 1 tablet (20 mg total) by mouth 2 (two) times daily.  60 tablet  11  . amitriptyline (ELAVIL) 50 MG tablet Take 50 mg by mouth 2 (two) times daily.          Allergies: No Known Allergies  Family History: Family History  Problem Relation Age of Onset  . Hypertension Mother   . Hypertension Father     History of blood clots in lungs  . Lymphoma Sister     Hodgkins  . Cancer Sister   . Cancer Maternal Aunt   . Cancer Maternal Uncle   . Cancer Maternal Grandmother   . Cancer Maternal Grandfather     Social History: History  Substance Use Topics  . Smoking status: Never Smoker   . Smokeless tobacco: Never Used  . Alcohol Use: No    Review of Systems: 10 Point review of systems was completed and is negative except as noted above.   Physical Exam:   Blood pressure 120/74, pulse 60, temperature 97.7 F (36.5 C), temperature source Oral, resp. rate 20, height 5\' 3"  (1.6 m), weight 161 lb 4.8 oz (73.165 kg), last menstrual period 04/23/2011.  General appearance: Alert, cooperative, well nourished, no apparent distress Head: Normocephalic, without obvious abnormality, atraumatic Eyes: Conjunctivae/corneas clear, PERRLA, EOMI Nose: Nares, septum  and mucosa are normal, no drainage or sinus tenderness Neck: No adenopathy, supple, symmetrical, trachea midline, thyroid not enlarged, no tenderness Resp: Clear to auscultation bilaterally Breasts: Tenderness bilateral breasts R > L and axilla tenderness bilaterally, right breast is larger than the left breast, there are no suspicious findings. On the left side, the patient has undergone a mastectomy and saline implant. The area of the scar in the upper outer quadrant looks fine. There are some very mild areas of induration associated with the scar, no nipple discharge expressed from either breast Cardio: Regular rate and rhythm, S1, S2 normal, no murmur, click, rub or gallop, right chest Port-A-Cath is w/o signs of infection GI: Soft, distended, non-tender, normoactive bowel sounds, well-healed surgical scar, no organomegaly Extremities: Extremities normal, atraumatic, no cyanosis or edema Lymph nodes: Cervical, supraclavicular, and axillary nodes normal, tender bilateral axilla areas Neurologic: Grossly normal   Laboratory Data: Results for orders placed in visit on 11/06/12 (from the past 48 hour(s))  CBC WITH DIFFERENTIAL     Status: Normal  Collection Time   11/06/12  1:37 PM      Component Value Range Comment   WBC 7.3  3.9 - 10.3 10e3/uL    NEUT# 5.0  1.5 - 6.5 10e3/uL    HGB 12.0  11.6 - 15.9 g/dL    HCT 56.4  33.2 - 95.1 %    Platelets 238  145 - 400 10e3/uL    MCV 93.4  79.5 - 101.0 fL    MCH 31.9  25.1 - 34.0 pg    MCHC 34.1  31.5 - 36.0 g/dL    RBC 8.84  1.66 - 0.63 10e6/uL    RDW 13.3  11.2 - 14.5 %    lymph# 1.8  0.9 - 3.3 10e3/uL    MONO# 0.3  0.1 - 0.9 10e3/uL    Eosinophils Absolute 0.1  0.0 - 0.5 10e3/uL    Basophils Absolute 0.0  0.0 - 0.1 10e3/uL    NEUT% 69.3  38.4 - 76.8 %    LYMPH% 25.0  14.0 - 49.7 %    MONO% 4.3  0.0 - 14.0 %    EOS% 0.7  0.0 - 7.0 %    BASO% 0.7  0.0 - 2.0 %   COMPREHENSIVE METABOLIC PANEL (CC13)     Status: Abnormal   Collection Time    11/06/12  1:37 PM      Component Value Range Comment   Sodium 139  136 - 145 mEq/L    Potassium 3.5  3.5 - 5.1 mEq/L    Chloride 108 (*) 98 - 107 mEq/L    CO2 22  22 - 29 mEq/L    Glucose 103 (*) 70 - 99 mg/dl    BUN 7.0  7.0 - 01.6 mg/dL    Creatinine 0.8  0.6 - 1.1 mg/dL    Total Bilirubin 0.10  0.20 - 1.20 mg/dL    Alkaline Phosphatase 72  40 - 150 U/L    AST 15  5 - 34 U/L    ALT 6  0 - 55 U/L    Total Protein 7.0  6.4 - 8.3 g/dL    Albumin 3.3 (*) 3.5 - 5.0 g/dL    Calcium 8.9  8.4 - 93.2 mg/dL   LACTATE DEHYDROGENASE (CC13)     Status: Normal   Collection Time   11/06/12  1:37 PM      Component Value Range Comment   LDH 153  125 - 245 U/L 09/07/12 - NOTE new reference range.     Imaging Studies: 1. Digital screening mammogram of the right breast was negative on 05/21/2011.  2. Chest x-ray, 2 view, from 06/10/2011 was negative.  3. Digital diagnostic left mammogram on 11/08/2011 showed no evidence of left breast recurrence. It was noted that the patient is due  for right screening mammogram in July 2013. Ultrasound of the left breast showed normal skin and fat. No discrete mass was seen.  4. MRI of the head with and without IV contrast from 04/10/2012 showed a normal exam. No cause of the patient's headache was identified. 5. Chest x-ray 2 view on 07/18/2012 showed no radiographic evidence of acute cardiopulmonary process.Right subclavian Port-A-Cath tip projects over the proximal SVC. 6.  Digital diagnostic unilateral right mammogram on 08/23/2012 showed no mammographic or sonographic evidence of malignancy, right breast.  A clear nipple discharge from a single duct orifice was expressed at physical examination. 7.  US abdomen complete on 08/24/2012 showed no acute finding. Negative for gallstones.  Two echogenic  hepatic lesions are likely hemangiomas. The larger is seen on a prior study. As the stability of the second lesion cannot be established, repeat ultrasound in 4-6 months is  recommended. 8.  Ductogram Unilateral right on 08/23/2012 showed a normal right breast ductography. Since no filling defect was identified within the ductal system from which the nipple discharge was expressed, in the absence of a mammographic or sonographic finding, and given the fact that the initial nipple discharge was from multiple ducts, the discharge is likely benign. Should the discharge recur and especially if it is bloody and arising from a single duct, repeat ductography may be beneficial at that time. 9.  NM HIDA scan on 09/07/2012 showed the gallbladder began to visualize at 43 minutes. This indicates patency of the cystic duct. Delayed visualization may be associated with element of chronic cholecystitis. During CCK infusion the gallbladder contracted 75.1%. 30% or greater is normal range. During CCK infusion the patient reported experiencing abdominal discomfort. 10. Diagnostic abdominal x-ray 1 view on 11/03/2012 showed moderate colonic stool correlating with the provided history of constipation.  Otherwise nonspecific abdomen with no signs of obstruction     Impression/Plan: Ms. Maverick Patman is now a little over 2 years from the time of diagnosis of her invasive cancer. She is on no treatment for her breast cancer at this time. She is 42 years old. Estrogen and progesterone receptors were positive, 99 and 53, respectively. HER2/neu was negative.  Dr. Arline Asp suggested that she should have a follow-up visit with Dr. Derrell Lolling to discuss her breast discomfort/nipple discharge and a possible breast MRI.  Dr. Arline Asp stated to the patient may have excessive prolactin excretion and may also need to see and Endocronologist if Dr. Derrell Lolling is unable to locate the source of the nipple discharge.  Dr. Arline Asp discussed the findings of liver hemangiomas found on the abdominal US with her and will order a CT of the abdomen/pelvis with contrast before her next office visit.    We will continue to flush  the Port-A-Cath with heparin every 2 months and she received a flush today. We will plan to see Victoria Delacruz again in 2 months (01/02/2013), at which time we will check CBC, chemistries, flush her Port-A-Cath and obtain a CT of the abdomen/pelvis with contrast (01/01/2013). Victoria Delacruz is encouraged to contact us in the interim if she has any questions or concerns.   Larina Bras, NP-C 11/06/2012, 8:41 PM

## 2012-11-06 NOTE — Patient Instructions (Addendum)
Please contact us at [phone] if you have any questions or concerns.  Please schedule appointment with Dr. Derrell Lolling as soon as possible to discuss breast concerns.

## 2012-11-06 NOTE — Telephone Encounter (Signed)
Gave pt appt for March 2014 lab and MD, gave pt oral contrast

## 2012-12-17 ENCOUNTER — Emergency Department (HOSPITAL_COMMUNITY)
Admission: EM | Admit: 2012-12-17 | Discharge: 2012-12-17 | Disposition: A | Discharge status: Home / Self Care | Payer: Self-pay | Attending: Emergency Medicine | Admitting: Emergency Medicine

## 2012-12-17 ENCOUNTER — Emergency Department (INDEPENDENT_AMBULATORY_CARE_PROVIDER_SITE_OTHER)
Admission: EM | Admit: 2012-12-17 | Discharge: 2012-12-17 | Disposition: A | Discharge status: Nursing Home (Includes ICF) | Payer: No Typology Code available for payment source | Source: Home / Self Care

## 2012-12-17 ENCOUNTER — Emergency Department (HOSPITAL_COMMUNITY): Payer: Self-pay

## 2012-12-17 ENCOUNTER — Other Ambulatory Visit (HOSPITAL_COMMUNITY)
Admission: RE | Admit: 2012-12-17 | Discharge: 2012-12-17 | Disposition: A | Discharge status: Home / Self Care | Payer: No Typology Code available for payment source | Source: Ambulatory Visit | Attending: Family Medicine | Admitting: Family Medicine

## 2012-12-17 ENCOUNTER — Encounter (HOSPITAL_COMMUNITY): Payer: Self-pay | Admitting: Emergency Medicine

## 2012-12-17 DIAGNOSIS — J029 Acute pharyngitis, unspecified: Secondary | ICD-10-CM | POA: Insufficient documentation

## 2012-12-17 DIAGNOSIS — R110 Nausea: Secondary | ICD-10-CM | POA: Insufficient documentation

## 2012-12-17 DIAGNOSIS — H9209 Otalgia, unspecified ear: Secondary | ICD-10-CM | POA: Insufficient documentation

## 2012-12-17 DIAGNOSIS — C50919 Malignant neoplasm of unspecified site of unspecified female breast: Secondary | ICD-10-CM

## 2012-12-17 DIAGNOSIS — Z79899 Other long term (current) drug therapy: Secondary | ICD-10-CM | POA: Insufficient documentation

## 2012-12-17 DIAGNOSIS — R059 Cough, unspecified: Secondary | ICD-10-CM | POA: Insufficient documentation

## 2012-12-17 DIAGNOSIS — I10 Essential (primary) hypertension: Secondary | ICD-10-CM | POA: Insufficient documentation

## 2012-12-17 DIAGNOSIS — N760 Acute vaginitis: Secondary | ICD-10-CM | POA: Insufficient documentation

## 2012-12-17 DIAGNOSIS — R109 Unspecified abdominal pain: Secondary | ICD-10-CM | POA: Insufficient documentation

## 2012-12-17 DIAGNOSIS — F329 Major depressive disorder, single episode, unspecified: Secondary | ICD-10-CM | POA: Insufficient documentation

## 2012-12-17 DIAGNOSIS — Z113 Encounter for screening for infections with a predominantly sexual mode of transmission: Secondary | ICD-10-CM | POA: Insufficient documentation

## 2012-12-17 DIAGNOSIS — Z90710 Acquired absence of both cervix and uterus: Secondary | ICD-10-CM

## 2012-12-17 DIAGNOSIS — Z853 Personal history of malignant neoplasm of breast: Secondary | ICD-10-CM | POA: Insufficient documentation

## 2012-12-17 DIAGNOSIS — Z8739 Personal history of other diseases of the musculoskeletal system and connective tissue: Secondary | ICD-10-CM | POA: Insufficient documentation

## 2012-12-17 DIAGNOSIS — J3489 Other specified disorders of nose and nasal sinuses: Secondary | ICD-10-CM | POA: Insufficient documentation

## 2012-12-17 DIAGNOSIS — Z862 Personal history of diseases of the blood and blood-forming organs and certain disorders involving the immune mechanism: Secondary | ICD-10-CM | POA: Insufficient documentation

## 2012-12-17 DIAGNOSIS — Z8759 Personal history of other complications of pregnancy, childbirth and the puerperium: Secondary | ICD-10-CM

## 2012-12-17 DIAGNOSIS — Z8742 Personal history of other diseases of the female genital tract: Secondary | ICD-10-CM

## 2012-12-17 DIAGNOSIS — R05 Cough: Secondary | ICD-10-CM | POA: Insufficient documentation

## 2012-12-17 DIAGNOSIS — F3289 Other specified depressive episodes: Secondary | ICD-10-CM | POA: Insufficient documentation

## 2012-12-17 LAB — COMPREHENSIVE METABOLIC PANEL
ALT: 10 U/L (ref 0–35)
AST: 20 U/L (ref 0–37)
Albumin: 3.7 g/dL (ref 3.5–5.2)
Alkaline Phosphatase: 77 U/L (ref 39–117)
BUN: 9 mg/dL (ref 6–23)
CO2: 25 mEq/L (ref 19–32)
Calcium: 9 mg/dL (ref 8.4–10.5)
Chloride: 107 mEq/L (ref 96–112)
Creatinine, Ser: 0.66 mg/dL (ref 0.50–1.10)
GFR calc Af Amer: >90 mL/min (ref >90)
GFR calc non Af Amer: >90 mL/min (ref >90)
Glucose, Bld: 104 mg/dL — ABNORMAL HIGH (ref 70–99)
Potassium: 3.7 mEq/L (ref 3.5–5.1)
Sodium: 140 mEq/L (ref 135–145)
Total Bilirubin: 0.2 mg/dL — ABNORMAL LOW (ref 0.3–1.2)
Total Protein: 7.8 g/dL (ref 6.0–8.3)

## 2012-12-17 LAB — CBC WITH DIFFERENTIAL/PLATELET
Basophils Absolute: 0.1 10*3/uL (ref 0.0–0.1)
Basophils Relative: 1 % (ref 0–1)
Eosinophils Absolute: 0.1 10*3/uL (ref 0.0–0.7)
Eosinophils Relative: 1 % (ref 0–5)
HCT: 37.1 % (ref 36.0–46.0)
Hemoglobin: 13.2 g/dL (ref 12.0–15.0)
Lymphocytes Relative: 34 % (ref 12–46)
Lymphs Abs: 2 10*3/uL (ref 0.7–4.0)
MCH: 32.4 pg (ref 26.0–34.0)
MCHC: 35.6 g/dL (ref 30.0–36.0)
MCV: 90.9 fL (ref 78.0–100.0)
Monocytes Absolute: 0.3 10*3/uL (ref 0.1–1.0)
Monocytes Relative: 5 % (ref 3–12)
Neutro Abs: 3.5 10*3/uL (ref 1.7–7.7)
Neutrophils Relative %: 59 % (ref 43–77)
Platelets: 243 10*3/uL (ref 150–400)
RBC: 4.08 MIL/uL (ref 3.87–5.11)
RDW: 13.5 % (ref 11.5–15.5)
WBC: 5.9 10*3/uL (ref 4.0–10.5)

## 2012-12-17 LAB — LIPASE, BLOOD: Lipase: 27 U/L (ref 11–59)

## 2012-12-17 MED ORDER — ONDANSETRON HCL 4 MG PO TABS: 4.0000 mg | ORAL_TABLET | Freq: Three times a day (TID) | ORAL | Status: DC | PRN

## 2012-12-17 MED ORDER — ONDANSETRON HCL 4 MG/2ML IJ SOLN
4.0000 mg | Freq: Once | INTRAMUSCULAR | Status: AC
  Administered 2012-12-17: 4 mg via INTRAVENOUS
  Filled 2012-12-17: qty 2

## 2012-12-17 MED ORDER — OXYCODONE-ACETAMINOPHEN 5-325 MG PO TABS: ORAL_TABLET | ORAL | Status: DC

## 2012-12-17 MED ORDER — MORPHINE SULFATE 4 MG/ML IJ SOLN
4.0000 mg | Freq: Once | INTRAMUSCULAR | Status: AC
  Administered 2012-12-17: 4 mg via INTRAVENOUS
  Filled 2012-12-17: qty 1

## 2012-12-17 MED ORDER — IOHEXOL 300 MG/ML  SOLN
100.0000 mL | Freq: Once | INTRAMUSCULAR | Status: AC | PRN
  Administered 2012-12-17: 100 mL via INTRAVENOUS

## 2012-12-17 MED ORDER — SODIUM CHLORIDE 0.9 % IV BOLUS (SEPSIS)
1000.0000 mL | Freq: Once | INTRAVENOUS | Status: AC
  Administered 2012-12-17: 1000 mL via INTRAVENOUS

## 2012-12-17 MED ORDER — IOHEXOL 300 MG/ML  SOLN
50.0000 mL | Freq: Once | INTRAMUSCULAR | Status: AC | PRN
  Administered 2012-12-17: 50 mL via ORAL

## 2012-12-17 NOTE — ED Notes (Signed)
CT notified of pt finishing CT contrast.  

## 2012-12-17 NOTE — ED Provider Notes (Signed)
Medical screening examination/treatment/procedure(s) were performed by non-physician practitioner and as supervising physician I was immediately available for consultation/collaboration.  Leslee Home, M.D.  Reuben Likes, MD 12/17/12 [phone]

## 2012-12-17 NOTE — ED Notes (Signed)
Patient says she has been having stomach pain for the last few months on her left side of her stomach.  Patient says what brought her in is the pain became unbearable.

## 2012-12-17 NOTE — ED Notes (Signed)
Family at bedside. 

## 2012-12-17 NOTE — ED Provider Notes (Signed)
History     CSN: [id number]  Arrival date & time 12/17/12  1220   First MD Initiated Contact with Patient 12/17/12 1346      Chief Complaint  Patient presents with  . Abdominal Pain    (Consider location/radiation/quality/duration/timing/severity/associated sxs/prior treatment) HPI Comments: 42 y.o. female presents today complaining of gradual onset LLQ abdominal pain worsening over the past few months. PMHx is significant for recurrent breast cancer which included left-sided mastectomy and then radiation/chemo (2011). Oncologist scheduled a CT for March, but this morning, a burning, sharp pain, different from what she had been feeling over the past few months, woke her from sleep this morning. Concerned, she went to urgent care who conducted physical exam including a pelvic exam and sent her to the ED for labs and CT.   Nothing made the pain better or worse. Course is now persistent. Pt took no interventions. PMHx significant for constipation, but pt admits normal bowel movement yesterday. Admits nausea. Admits getting over a cold. Denies fever, chills, diarrhea, vomiting.    Patient is a 42 y.o. female presenting with abdominal pain.  Abdominal Pain Associated symptoms: cough, nausea and sore throat   Associated symptoms: no chest pain, no constipation, no diarrhea, no dysuria, no fever, no shortness of breath, no vaginal discharge and no vomiting     Past Medical History  Diagnosis Date  . ECTOPIC PREGNANCY 1997 and 2011    x 2  . Hypertension   . Fibromyalgia   . Fibroid   . Breast cancer 2004 and 2011  . Anemia   . Arthritis   . Dizziness   . Family history of breast cancer     grandmother  . Blood transfusion 05/17/11, 05/25/11    anemia  . Depression     Past Surgical History  Procedure Laterality Date  . Laparoscopy for ectopic pregnancy    . Laparoscopy w/ mini-laparotomy    . Reconstruction breast immediate / delayed w/ tissue expander  2004  . Mastectomy   2004    complete with reconstruction x 3, no b/p punctures to left arm  . Laparoscopic assisted vaginal hysterectomy  08/30/2011    Procedure: LAPAROSCOPIC ASSISTED VAGINAL HYSTERECTOMY;  Surgeon: Tereso Newcomer, MD;  Location: [facility];  Service: Gynecology;  Laterality: N/A;    Family History  Problem Relation Age of Onset  . Hypertension Mother   . Hypertension Father     History of blood clots in lungs  . Lymphoma Sister     Hodgkins  . Cancer Sister   . Cancer Maternal Aunt   . Cancer Maternal Uncle   . Cancer Maternal Grandmother   . Cancer Maternal Grandfather     History  Substance Use Topics  . Smoking status: Never Smoker   . Smokeless tobacco: Never Used  . Alcohol Use: No    OB History   Grav Para Term Preterm Abortions TAB SAB Ect Mult Living   2    2  1 1   0      Review of Systems  Constitutional: Negative for fever and diaphoresis.  HENT: Positive for ear pain, sore throat, rhinorrhea and sinus pressure. Negative for neck pain and neck stiffness.   Eyes: Negative for visual disturbance.  Respiratory: Positive for cough. Negative for apnea, chest tightness and shortness of breath.   Cardiovascular: Negative for chest pain and palpitations.  Gastrointestinal: Positive for nausea and abdominal pain. Negative for vomiting, diarrhea and constipation.  LLQ pain radiating up the left side and around the back  Genitourinary: Negative for dysuria, vaginal discharge, difficulty urinating and pelvic pain.  Musculoskeletal: Negative for gait problem.  Skin: Negative for rash.  Neurological: Negative for dizziness, weakness, light-headedness, numbness and headaches.    Allergies  Review of patient's allergies indicates no known allergies.  Home Medications   Current Outpatient Rx  Name  Route  Sig  Dispense  Refill  . amitriptyline (ELAVIL) 50 MG tablet   Oral   Take 50 mg by mouth 2 (two) times daily.         Marland Kitchen gabapentin (NEURONTIN) 300 MG  capsule   Oral   Take 1 capsule (300 mg total) by mouth at bedtime.   30 capsule   11   . hydrochlorothiazide (MICROZIDE) 12.5 MG capsule   Oral   Take 1 capsule (12.5 mg total) by mouth daily.   30 capsule   11   . polyethylene glycol powder (GLYCOLAX/MIRALAX) powder   Oral   Take 17 g by mouth 2 (two) times daily as needed.   3350 g   1   . propranolol (INDERAL) 20 MG tablet   Oral   Take 1 tablet (20 mg total) by mouth 2 (two) times daily.   60 tablet   11   . Pseudoeph-Doxylamine-DM-APAP (NYQUIL PO)   Oral   Take by mouth 3 (three) times daily as needed (cough).         . Pseudoephedrine HCl (SUDAFED PO)   Oral   Take 2 tablets by mouth 2 (two) times daily.         Marland Kitchen EXPIRED: amitriptyline (ELAVIL) 50 MG tablet   Oral   Take 50 mg by mouth 2 (two) times daily.             BP 126/78  Pulse 68  Temp(Src) 98.1 F (36.7 C) (Oral)  Resp 18  SpO2 97%  LMP 04/23/2011  Physical Exam  Nursing note and vitals reviewed. Constitutional: She is oriented to person, place, and time. She appears well-developed and well-nourished. No distress.  HENT:  Head: Normocephalic and atraumatic.  Right Ear: Tympanic membrane normal.  Left Ear: Tympanic membrane normal.  Nose: No mucosal edema or rhinorrhea. Right sinus exhibits no maxillary sinus tenderness and no frontal sinus tenderness. Left sinus exhibits no maxillary sinus tenderness and no frontal sinus tenderness.  Mouth/Throat: No oropharyngeal exudate or posterior oropharyngeal erythema.  Eyes: EOM are normal. Pupils are equal, round, and reactive to light.  Neck: Normal range of motion. Neck supple.  No meningeal signs  Cardiovascular: Normal rate, regular rhythm and normal heart sounds.  Exam reveals no gallop and no friction rub.   No murmur heard. Pulmonary/Chest: Effort normal and breath sounds normal. No respiratory distress. She has no wheezes. She has no rales. She exhibits no tenderness.  Abdominal:  Soft. Bowel sounds are normal. She exhibits no distension. There is tenderness. There is no rebound and no guarding.  RLQ, central and LLQ abdominal pain radiatiating up the left lateral and around lumbar-thoracic back.  Musculoskeletal: Normal range of motion. She exhibits no edema and no tenderness.  5/5 strength throughout. Good grip strength.   Neurological: She is alert and oriented to person, place, and time. No cranial nerve deficit.  No focal deficits  Skin: Skin is warm and dry. She is not diaphoretic. No erythema.    ED Course  Procedures (including critical care time)  Labs Reviewed  COMPREHENSIVE METABOLIC PANEL -  Abnormal; Notable for the following:    Glucose, Bld 104 (*)    Total Bilirubin 0.2 (*)    All other components within normal limits  CBC WITH DIFFERENTIAL  LIPASE, BLOOD   No results found.  Diagnosis: abdominal pain    MDM  Patient is nontoxic, nonseptic appearing, in no apparent distress.  Patient's pain and other symptoms adequately managed in emergency department.  Fluid bolus given.  Labs, imaging and vitals reviewed.  Patient does not meet the SIRS or Sepsis criteria.  On repeat exam patient does not have a surgical abdomin and there are no peritoneal signs.  No indication of appendicitis, bowel obstruction, bowel perforation, cholecystitis, diverticulitis, PID or ectopic pregnancy.  Patient discharged home with symptomatic treatment and given strict instructions for follow-up with their primary care physician and oncologist.  I have also discussed reasons to return immediately to the ER.  Patient expresses understanding and agrees with plan. Pt case reviewed with Dr. Preston Fleeting who agrees with the plan.     Glade Nurse, PA-C 12/18/12 1242

## 2012-12-17 NOTE — ED Notes (Signed)
Patient is resting comfortably, with family at bedside.

## 2012-12-17 NOTE — ED Notes (Signed)
Patient is alert and orientedx4.  Patient was explained discharge instructions and she understood them without questions.  Patient's mom is going home with her.

## 2012-12-17 NOTE — ED Notes (Signed)
Pt is c/o severe left lower quadrant pain that comes and goes. Pt states that she is schedule to have a ct scan done march 4th.  Pain wakes her from sleep. Unable to get comfortable. Constant fullness filling. Pt is unable to eat/sleep.  Hx of breast cancer.

## 2012-12-17 NOTE — ED Provider Notes (Signed)
Victoria Delacruz is a 42 y.o. female who presents to Urgent Care today for diffuse abdominal pain. Patient notes worsening abdominal pain for several months. She previously been seen by her primary care provider and her oncologist in early January. She has a CT scan scheduled for March for routine oncologic followup. However this morning she was awoken from her sleep by sharp significant abdominal pain. She notes the pain is mostly in her left lower quadrant but goes to the midline and upper quadrants.  She notes that she had normal bowel movement yesterday he denies any blood in her stool or black tarry stool. She denies any vaginal bleeding or discharge.  Feels well otherwise with no fevers or chills.    PMH reviewed. Significant for breast cancer with recurrence in 2011, and hysterectomy.  History  Substance Use Topics  . Smoking status: Never Smoker   . Smokeless tobacco: Never Used  . Alcohol Use: No   ROS as above Medications reviewed. No current facility-administered medications for this encounter.   Current Outpatient Prescriptions  Medication Sig Dispense Refill  . hydrochlorothiazide (MICROZIDE) 12.5 MG capsule Take 1 capsule (12.5 mg total) by mouth daily.  30 capsule  11  . propranolol (INDERAL) 20 MG tablet Take 1 tablet (20 mg total) by mouth 2 (two) times daily.  60 tablet  11  . amitriptyline (ELAVIL) 50 MG tablet Take 50 mg by mouth 2 (two) times daily.        Marland Kitchen gabapentin (NEURONTIN) 300 MG capsule Take 1 capsule (300 mg total) by mouth at bedtime.  30 capsule  11  . omeprazole (PRILOSEC) 40 MG capsule Take 1 capsule (40 mg total) by mouth daily.  30 capsule  1  . polyethylene glycol powder (GLYCOLAX/MIRALAX) powder Take 17 g by mouth 2 (two) times daily as needed.  3350 g  1    Exam:  BP 160/112  Pulse 77  Temp(Src) 98.3 F (36.8 C) (Oral)  Resp 19  SpO2 100%  LMP 04/23/2011 Gen: Well NAD HEENT: EOMI,  MMM Lungs: CTABL Nl WOB Heart: RRR no MRG Abd: NABS,  nondistended tender to palpation left lower quadrant. No rebound. Mild guarding Exts: Non edematous BL  LE, warm and well perfused.  Pelvic: Normal external genitalia. Vaginal canal with thick white discharge. Vaginal cuff is normal-appearing.  Bimanual exam no masses noted tenderness lower left quadrant.   No results found for this or any previous visit (from the past 24 hour(s)). No results found.  Assessment and Plan: 42 y.o. female with chronic lower abdominal pain with acute worsening today.   Patient is quite distressed by her abdominal pain.  She is a pertinent medical history for multiple abdominal surgeries and breast cancer with recurrence.  Her pain woke her from sleep this morning has been unremitting. I feel that it is reasonable at this juncture to for further evaluation in the emergency room. Would recommend lab work and a CT scan.  Patient expresses understanding and agreement.  We'll obtain wet prep, gonorrhea Chlamydia testing with vaginal secretions obtained today.  If all well patient will followup [facility].     Rodolph Bong, MD 12/17/12 1200

## 2012-12-17 NOTE — ED Notes (Signed)
Pt sent from urgent care for further eval of generalized abdominal pain. Pt seen by Oncologist for same and is scheduled for CT on march 4th. Pt reports pain ongoing for several months but became increasingly worse today. Pt denies n/v today.

## 2012-12-17 NOTE — ED Notes (Signed)
Patient is resting comfortably. 

## 2012-12-19 ENCOUNTER — Telehealth: Payer: Self-pay | Admitting: Family Medicine

## 2012-12-19 NOTE — ED Provider Notes (Signed)
Medical screening examination/treatment/procedure(s) were performed by non-physician practitioner and as supervising physician I was immediately available for consultation/collaboration.   Dione Booze, MD 12/19/12 [phone]

## 2012-12-19 NOTE — Telephone Encounter (Signed)
Called pt back regarding Wet prep. She will take OTC yeast infx medicine.  I advised her to follow up with oncology about her CT scan of her belly.

## 2012-12-26 ENCOUNTER — Telehealth: Payer: Self-pay | Admitting: Medical Oncology

## 2012-12-26 ENCOUNTER — Telehealth (HOSPITAL_COMMUNITY): Payer: Self-pay | Admitting: Emergency Medicine

## 2012-12-26 ENCOUNTER — Telehealth (HOSPITAL_COMMUNITY): Payer: Self-pay | Admitting: *Deleted

## 2012-12-26 MED ORDER — METRONIDAZOLE 500 MG PO TABS: 500.0000 mg | ORAL_TABLET | Freq: Two times a day (BID) | ORAL | Status: DC

## 2012-12-26 NOTE — ED Notes (Signed)
Patient was positive for Gardnerella, will send Rx for Flagyl to her pharmacy.  Reuben Likes, MD 12/26/12 [phone]

## 2012-12-26 NOTE — ED Notes (Signed)
GC/Chlamydia neg., Affirm: Gardnerella pos., Candida and Trich neg.,   2/16 Message sent to Dr. Clementeen Graham.  2/24 Message sent to Dr. Lorenz Coaster and order obtained for Flagyl.  It was e-prescribed to the [place] at [facility]. 2/25 I called pt.  Pt. verified x 2 and given results.  Pt. told she needs Flagyl for bacterial vaginosis.   Pt. instructed to no alcohol while taking this medication.  Pt. told where to get her Rx. Pt. voiced understanding. Vassie Moselle 12/26/2012

## 2012-12-26 NOTE — Telephone Encounter (Signed)
Message copied by Reuben Likes on Tue Dec 26, 2012  9:49 AM ------      Message from: Vassie Moselle      Created: Mon Dec 25, 2012  9:48 PM       Pt. Pos. for Gardnerella.  Pt. was transferred to the ED but they did not order Flagyl.  I sent a message to Dr. Clementeen Graham on 2/16.  He has not responded. I resent a message today.  Do you want to do the order?      Vassie Moselle      12/25/2012       ------

## 2012-12-26 NOTE — Telephone Encounter (Signed)
Pt called and states that 12/17/12 she had sever abdominal pain. She went to the walk in clinic and they sent her to ED. She had a CT of abdomen and pelvis with contrast. She has an appointment Friday for another CT. She is asking if she needs to do this again.per Dr. Arline Asp no. Dr. Arline Asp is going to review the scan before her visit next week. She states she has had on and off pain in her left mid quadrant and sometimes radiating around to her back. They did not find anything on her CT but she states something is not right. She will see Dr.Murinson on 01/02/13.

## 2013-01-01 ENCOUNTER — Encounter: Payer: Self-pay | Admitting: Oncology

## 2013-01-01 ENCOUNTER — Ambulatory Visit (HOSPITAL_COMMUNITY): Payer: No Typology Code available for payment source

## 2013-01-01 NOTE — Progress Notes (Signed)
CT scan of the abdomen and pelvis with IV contrast was carried out on 12/17/2012. There was a 2.5 cm lesion in the superior right hepatic lobe at the hepatic dome and a 7 mm low density in the liver on image 15. These were present on a PET scan from 04/30/2010 and are unchanged. They are believed to be hemangiomas.  The scans were reviewed with a radiologist.

## 2013-01-02 ENCOUNTER — Other Ambulatory Visit: Payer: Self-pay | Admitting: Lab

## 2013-01-02 ENCOUNTER — Ambulatory Visit: Payer: Self-pay | Admitting: Oncology

## 2013-01-10 ENCOUNTER — Other Ambulatory Visit: Payer: Self-pay | Admitting: Medical Oncology

## 2013-01-12 ENCOUNTER — Telehealth: Payer: Self-pay | Admitting: *Deleted

## 2013-01-12 NOTE — Telephone Encounter (Signed)
sw pt appt d/t was given for 01/15/2013 @ 3:30pm for a flush.

## 2013-01-14 ENCOUNTER — Telehealth: Payer: Self-pay | Admitting: Oncology

## 2013-01-14 NOTE — Telephone Encounter (Signed)
lmonvm for pt re appt for 4/22. Pt to get new schedule when she comes in on 3/17.

## 2013-01-16 ENCOUNTER — Telehealth: Payer: Self-pay | Admitting: Family Medicine

## 2013-01-16 NOTE — Telephone Encounter (Signed)
Pt had recent ED visit in mid-February and is due for f/u of abdominal pain and HTN. Called pt and she states abdominal pain and headaches still present. She also reports taking laxatives which are helping some. She states she will call and make a follow-up appt at [facility].

## 2013-01-18 ENCOUNTER — Ambulatory Visit (HOSPITAL_BASED_OUTPATIENT_CLINIC_OR_DEPARTMENT_OTHER): Payer: No Typology Code available for payment source

## 2013-01-18 VITALS — BP 142/91 | HR 65 | Temp 98.3°F

## 2013-01-18 DIAGNOSIS — Z452 Encounter for adjustment and management of vascular access device: Secondary | ICD-10-CM

## 2013-01-18 DIAGNOSIS — C50419 Malignant neoplasm of upper-outer quadrant of unspecified female breast: Secondary | ICD-10-CM

## 2013-01-18 DIAGNOSIS — C50912 Malignant neoplasm of unspecified site of left female breast: Secondary | ICD-10-CM

## 2013-01-18 MED ORDER — HEPARIN SOD (PORK) LOCK FLUSH 100 UNIT/ML IV SOLN
500.0000 [IU] | Freq: Once | INTRAVENOUS | Status: AC
  Administered 2013-01-18: 500 [IU] via INTRAVENOUS
  Filled 2013-01-18: qty 5

## 2013-01-18 MED ORDER — SODIUM CHLORIDE 0.9 % IJ SOLN
10.0000 mL | INTRAMUSCULAR | Status: DC | PRN
  Administered 2013-01-18: 10 mL via INTRAVENOUS
  Filled 2013-01-18: qty 10

## 2013-01-18 NOTE — Patient Instructions (Signed)
Call MD for problems 

## 2013-01-18 NOTE — Progress Notes (Signed)
Patient to scheduling after flush

## 2013-01-19 ENCOUNTER — Encounter: Payer: Self-pay | Admitting: Family Medicine

## 2013-01-19 ENCOUNTER — Ambulatory Visit (INDEPENDENT_AMBULATORY_CARE_PROVIDER_SITE_OTHER): Payer: No Typology Code available for payment source | Admitting: Family Medicine

## 2013-01-19 VITALS — BP 130/79 | HR 69 | Temp 98.3°F | Ht 61.0 in | Wt 155.0 lb

## 2013-01-19 DIAGNOSIS — F329 Major depressive disorder, single episode, unspecified: Secondary | ICD-10-CM

## 2013-01-19 DIAGNOSIS — F32A Depression, unspecified: Secondary | ICD-10-CM

## 2013-01-19 DIAGNOSIS — G43909 Migraine, unspecified, not intractable, without status migrainosus: Secondary | ICD-10-CM

## 2013-01-19 DIAGNOSIS — C50919 Malignant neoplasm of unspecified site of unspecified female breast: Secondary | ICD-10-CM

## 2013-01-19 DIAGNOSIS — I10 Essential (primary) hypertension: Secondary | ICD-10-CM

## 2013-01-19 NOTE — Progress Notes (Signed)
Subjective:     Patient ID: Victoria Delacruz, female   DOB: September 02, 1971, 42 y.o.   MRN: [id number]  CC - F/u BP  HPI  Victoria Delacruz is a 42 y.o. female with h/o HTN, depression, fibromyalgia, and breast cancer here to follow-up on her blood pressure. She also has questions about her medication for fibromyalgia.  1. HTN - Pt reports taking HCTZ 12.5mg  daily and feels fine. BP today is 130/70. Denies concerns/questions.  2. Depression and Fibromyalgia - Pt reports continued pain that intensifies with depression and "transfers" from day to day to different parts of her body. She takes gabapentin and elavil for this, but is only taking gabapentin once daily most days (skips AM dose because it makes her very tired). Elavil makes her very tired and would like to know if there are other medication regimens that would not be as fatiguing. Does not see anyone for depression and is reluctant but sees how this may help. Denies SI/HI. Some days more depressed than others and some days may not leave house.  3. H/o breast cancer - Pt has appointment scheduled next month with Oncology. She has questions about reconstruction and possible port-a-cath removal.  Review of Systems - Per HPI; also, abdominal pain worse than her IBS pain  PMH, SH, and FH reviewed with changes to medications made in EPIC: -Discontinued meds reported as no longer taking: flagyl, zofran, and cough suppressants Past Medical History  Diagnosis Date  . ECTOPIC PREGNANCY 1997 and 2011    x 2  . Hypertension   . Fibromyalgia   . Fibroid   . Breast cancer 2004 and 2011  . Anemia   . Arthritis   . Dizziness   . Family history of breast cancer     grandmother  . Blood transfusion 05/17/11, 05/25/11    anemia  . Depression        Objective:   Physical Exam BP 130/79  Pulse 69  Temp(Src) 98.3 F (36.8 C) (Oral)  Ht 5\' 1"  (1.549 m)  Wt 155 lb (70.308 kg)  BMI 29.3 kg/m2  LMP 04/23/2011  GEN: NAD, sitting in exam room,  pleasant, well-dressed and well-groomed NEURO: Alert, normal speech, normal gait, EOMI PSYCH: Mood "alright but I have my days," affect congruent and mildly flattened, normal thought process, no psychomotor retardation    Assessment:     Victoria Delacruz is a 42 y.o. female with h/o HTN, depression, fibromyalgia, and breast cancer here for BP follow-up. Also discussed other chronic issues.    Plan:      # Health maintenance - Bring medications to each visit and let me know 2 weeks prior to running out - Call to let me know when last TDAP was

## 2013-01-19 NOTE — Patient Instructions (Addendum)
It was great to see you today!  Your blood pressure today is 130/79. Good job, and continue taking your medications daily.  For your h/o breast cancer, make your follow-up appt with oncology. Call and follow-up as needed with Dr. Aurelio Jew and Dr. Derrell Lolling.  For your depression, continue taking medications as prescribed.  I am giving you Dr. Carola Rhine number if you'd like to call her to see if she is available for appointments.  Call and let me know about when you had your last tetanus shot (TDAP).  Follow up with me to discuss headaches when convenient for you, within 1-2 months.

## 2013-01-20 DIAGNOSIS — F329 Major depressive disorder, single episode, unspecified: Secondary | ICD-10-CM | POA: Insufficient documentation

## 2013-01-20 DIAGNOSIS — F32A Depression, unspecified: Secondary | ICD-10-CM | POA: Insufficient documentation

## 2013-01-20 NOTE — Assessment & Plan Note (Signed)
-   F/u with oncology as scheduled for h/o breast cancer;  - f/u with Dr. Aurelio Jew and Dr. Derrell Lolling to answer questions re: reconstruction asymmetry and port-a-cath

## 2013-01-20 NOTE — Assessment & Plan Note (Addendum)
Pt stable on amitriptyline (also for migraines) and gabapentin (also for fibromyalgia). Denies SI/HI. - Continue medications as prescribed. - Pt thinking about therapy, though does not seem eager. Dr. Carola Rhine information given. - PHQ-9 at f/u

## 2013-01-20 NOTE — Assessment & Plan Note (Signed)
Well-controlled today.  - Continue daily HCTZ 12.5mg   - BMET last month WNL

## 2013-01-20 NOTE — Assessment & Plan Note (Addendum)
Currently on gabapentin and elavil; Pt thinks this is worsened by depression. - F/u in 1-2 months

## 2013-01-30 ENCOUNTER — Telehealth: Payer: Self-pay | Admitting: Psychology

## 2013-01-30 NOTE — Telephone Encounter (Signed)
Wonderful. I hope she makes it.

## 2013-01-30 NOTE — Telephone Encounter (Signed)
Patient called to schedule a beh med appointment.  She sounds ambivalent (some days she thinks she needs to see someone and other days she doesn't).  Scheduled for 4/14 at 9:00.  I told her the following: -  If she is unable to make the appointment she needs to call me. -  If she misses the appointment without a phone call, I won't be able to schedule her back in my clinic. She voiced an understanding and was able to repeat the appointment date and time back to me.

## 2013-02-06 ENCOUNTER — Ambulatory Visit: Payer: No Typology Code available for payment source | Admitting: Family Medicine

## 2013-02-12 ENCOUNTER — Ambulatory Visit (INDEPENDENT_AMBULATORY_CARE_PROVIDER_SITE_OTHER): Payer: No Typology Code available for payment source | Admitting: Psychology

## 2013-02-12 DIAGNOSIS — F32A Depression, unspecified: Secondary | ICD-10-CM

## 2013-02-12 DIAGNOSIS — F329 Major depressive disorder, single episode, unspecified: Secondary | ICD-10-CM

## 2013-02-12 NOTE — Assessment & Plan Note (Signed)
Report of mood is irritable and sad.  Affect is within normal limits.  She is a little guarded but opens up when talking about her dog.  Sleep is disturbed at 4-5 hours per night that she does not miss appreciably.  About twice a month, is so fatigued that she sleeps for over 24 hours.  Appetite is reasonable.  She is trying to lose weight.  PHQ-9 is 19 with sleep, feeling bad about self, concentration and psychomotor dysfunction all at "nearly every day."  Anhedonia and little energy are "more than half the days" and the rest are all "several days."  No evidence of active suicidal ideation.  Thoughts seem clear and goal directed.  MDQ was negative.  Onset of mood issues seems tied to cancer and the aftermath.  She has had significant health issues post chemo and radiation that medical doctors don't understand.  She feels like they think she is crazy, making things up or exaggerating.  Her fingers and feet swell and they can't make sense of that.    I think she is only taking 50 mg of amitriptyline and might not even be taking it daily.  She is likely under treated for depression.  Was using it for both pain control and depression but it makes her sleepy and she likes to stay productive.  We might need to look at alternatives keeping in mind that she has the orange card.    Next visit would like to try to tease out if mood issues get in the way of her function.  There is clearly an overlap of mood and pain issues with a lot of dynamic interplay.  Would like specific examples of how mood interferes.  Will give feedback on self-report measures and hopefully develop a plan.  Might consider looking at grief as a construct to see if she has done any work around this.  Consider bibliotherapy.  Also consider sense of self.  What does she tell herself about herself.

## 2013-02-12 NOTE — Progress Notes (Signed)
Victoria Delacruz presented for an initial psychological assessment.  A client information sheet detailing the Behavioral Medicine Service was provided.  The patient voiced an understanding of what was detailed on this sheet including the issue of confidentiality and the limits thereof.  Presenting Problem: Victoria Delacruz is ambivalent about being here.  She says she is "always irritable" and when asked to quantify, she says about 4 out of the last 7 days she has been bothered to a significant degree by her irritability.  She snaps easily.  She also reports crying on occasion.  Her co-occuring medical conditions - most of which developed after her second breast cancer (this one involved chemo and radiation), impact her function (sleep, fatigue, pain) to a marked degree.  Relevant Medical History: Diagnosed with breast cancer at age 37.  Mastectomy without other treatment.  Was told that she could not develop breast cancer on that side again.  In 2011, second breast cancer, same side was diagnosed.  Another surgery and chemo and radiation.  Since that time, fibromyalgia, migraine and significant fatigue interfere with function.  She also has a history of IBS.  When constipated in the past, her pain has been so bad she went to the ED.  Hysterectomy after chemo sent her into early menopause.  Had blood transfusions secondary to chronic bleeding.    Relevant Psychiatric / Psychological History: Denied hospitalization for psychiatric reasons.  Denied prior therapy history.  Denied prior pharmacologic treatment other than amitriptyline which was started about 2.5 years ago (post cancer diagnosis).    Family History: Lives with Victoria Delacruz whom she has dated since 2008.  No children.  This is bothersome.  Was serving as foster parents just prior to her second cancer diagnosis.  Mother and father were not married.  Mother got pregnant with her when she was 88.  She was raised by her maternal grandmother until she died at age 44 from  breast CA.  Mom had 3 other children whom Victoria Delacruz largely raised.  She is close with them and closer now with her mother after many challenging years.  Not close with father.  Has supportive neighbors and Victoria Delacruz is a "very good man."    History of Abuse: Did not assess.  Education / Occupation: Did not assess how far she went in school but she worked for a [employer] prior to being laid off.  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  Enjoyed the work but stopped in 2012 secondary to medical issues.    Substance Use: Did not assess.  Other:  Aunt is terminally ill with pulmonary fibrosis.  She is teaching Victoria Delacruz to bake which is something Victoria Delacruz does when she is feeling well.  Victoria Delacruz has a 150 pound Rotweiler named Victoria Delacruz that is her "baby."

## 2013-02-19 ENCOUNTER — Encounter: Payer: Self-pay | Admitting: Lab

## 2013-02-20 ENCOUNTER — Telehealth: Payer: Self-pay | Admitting: Oncology

## 2013-02-20 ENCOUNTER — Encounter: Payer: Self-pay | Admitting: Oncology

## 2013-02-20 ENCOUNTER — Ambulatory Visit (HOSPITAL_BASED_OUTPATIENT_CLINIC_OR_DEPARTMENT_OTHER): Payer: No Typology Code available for payment source | Admitting: Oncology

## 2013-02-20 ENCOUNTER — Other Ambulatory Visit (HOSPITAL_BASED_OUTPATIENT_CLINIC_OR_DEPARTMENT_OTHER): Payer: No Typology Code available for payment source | Admitting: Lab

## 2013-02-20 VITALS — BP 129/91 | HR 73 | Temp 97.5°F | Resp 20 | Ht 61.0 in | Wt 159.2 lb

## 2013-02-20 DIAGNOSIS — C50419 Malignant neoplasm of upper-outer quadrant of unspecified female breast: Secondary | ICD-10-CM

## 2013-02-20 DIAGNOSIS — C50912 Malignant neoplasm of unspecified site of left female breast: Secondary | ICD-10-CM

## 2013-02-20 DIAGNOSIS — Z170 Estrogen receptor positive status [ER+]: Secondary | ICD-10-CM

## 2013-02-20 DIAGNOSIS — IMO0001 Reserved for inherently not codable concepts without codable children: Secondary | ICD-10-CM

## 2013-02-20 DIAGNOSIS — C50919 Malignant neoplasm of unspecified site of unspecified female breast: Secondary | ICD-10-CM

## 2013-02-20 LAB — CBC WITH DIFFERENTIAL/PLATELET
BASO%: 0.6 % (ref 0.0–2.0)
Basophils Absolute: 0 10*3/uL (ref 0.0–0.1)
EOS%: 1 % (ref 0.0–7.0)
Eosinophils Absolute: 0.1 10*3/uL (ref 0.0–0.5)
HCT: 35.1 % (ref 34.8–46.6)
HGB: 12.1 g/dL (ref 11.6–15.9)
LYMPH%: 29.2 % (ref 14.0–49.7)
MCH: 31.7 pg (ref 25.1–34.0)
MCHC: 34.6 g/dL (ref 31.5–36.0)
MCV: 91.7 fL (ref 79.5–101.0)
MONO#: 0.3 10*3/uL (ref 0.1–0.9)
MONO%: 4.8 % (ref 0.0–14.0)
NEUT#: 4 10*3/uL (ref 1.5–6.5)
NEUT%: 64.4 % (ref 38.4–76.8)
Platelets: 211 10*3/uL (ref 145–400)
RBC: 3.83 10*6/uL (ref 3.70–5.45)
RDW: 13.2 % (ref 11.2–14.5)
WBC: 6.3 10*3/uL (ref 3.9–10.3)
lymph#: 1.8 10*3/uL (ref 0.9–3.3)

## 2013-02-20 LAB — COMPREHENSIVE METABOLIC PANEL (CC13)
ALT: 9 U/L (ref 0–55)
AST: 16 U/L (ref 5–34)
Albumin: 3.4 g/dL — ABNORMAL LOW (ref 3.5–5.0)
Alkaline Phosphatase: 82 U/L (ref 40–150)
BUN: 12.3 mg/dL (ref 7.0–26.0)
CO2: 23 mEq/L (ref 22–29)
Calcium: 8.8 mg/dL (ref 8.4–10.4)
Chloride: 109 mEq/L — ABNORMAL HIGH (ref 98–107)
Creatinine: 0.8 mg/dL (ref 0.6–1.1)
Glucose: 101 mg/dl — ABNORMAL HIGH (ref 70–99)
Potassium: 3.7 mEq/L (ref 3.5–5.1)
Sodium: 139 mEq/L (ref 136–145)
Total Bilirubin: 0.34 mg/dL (ref 0.20–1.20)
Total Protein: 7.2 g/dL (ref 6.4–8.3)

## 2013-02-20 LAB — LACTATE DEHYDROGENASE (CC13): LDH: 169 U/L (ref 125–245)

## 2013-02-20 MED ORDER — HEPARIN SOD (PORK) LOCK FLUSH 100 UNIT/ML IV SOLN
500.0000 [IU] | Freq: Once | INTRAVENOUS | Status: AC
  Administered 2013-02-20: 500 [IU] via INTRAVENOUS
  Filled 2013-02-20: qty 5

## 2013-02-20 MED ORDER — SODIUM CHLORIDE 0.9 % IJ SOLN
10.0000 mL | INTRAMUSCULAR | Status: DC | PRN
  Administered 2013-02-20: 10 mL via INTRAVENOUS
  Filled 2013-02-20: qty 10

## 2013-02-20 NOTE — Progress Notes (Signed)
This office note has been dictated.  #[id number]

## 2013-02-21 NOTE — Progress Notes (Signed)
CC:   [facility], Fax [phone] Angelia Mould. Derrell Lolling, M.D. Alben Deeds, MD Yaakov Guthrie. [name], M.D.  PROBLEM LIST:  1. 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  Tumor was 4.0 cm. Sentinel lymph nodes were negative. Estrogen  receptor was 24%, progesterone receptor 2%. The patient underwent  a saline implant.  2. 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 The patient had a 6.0 mm subdermal recurrence in the  upper outer quadrant of the left breast at about the 1-2 o'clock  position. She underwent local excision of the tumor on 05/28/2010  followed by 4 cycles of adjuvant chemotherapy with Cytoxan and  Taxotere in combination with Neulasta from 06/08/2010 through  08/10/2010. Radiation treatments to the left breast and chest wall  were given from 09/08/2010 through 11/04/2010 consisting of 4780  cGy in 26 fractions with an 1800 cGy boost in 9 fractions. She  took tamoxifen sporadically for awhile, then started having vaginal  bleeding and tamoxifen was ultimately discontinued in June of 2012.  3. Laparoscopic assisted vaginal hysterectomy 08/30/2011.  4. History of iron-deficiency anemia secondary to heavy periods and  possibly fibroids treated with intravenous iron in the past.  5. Irritable bowel syndrome.  6. History of hypertension.  7. Family history of breast cancer, but negative genetic testing.  8. Right-sided Port-A-Cath placed on 05/28/2010, currently being  maintained with heparin flushes every 2 months.  9. Fibromyalgia diagnosed in the fall 2011.  10. Migraine headaches which started around 2000. 11. Hemangiomas involving the liver first noted on a PET scan from 04/30/2010.  The CT scan of the abdomen and pelvis with IV contrast carried out on 12/17/2012 showed a 2.5 cm lesion in the superior right hepatic lobe at the hepatic dome and a 7  mm lesion seen on image 15.   MEDICATIONS:  Reviewed and recorded. Current Outpatient Prescriptions  Medication Sig Dispense Refill  . amitriptyline (ELAVIL) 50 MG tablet Take 50 mg by mouth 2 (two) times daily.      Marland Kitchen gabapentin (NEURONTIN) 300 MG capsule Take 1 capsule (300 mg total) by mouth at bedtime.  30 capsule  11  . hydrochlorothiazide (MICROZIDE) 12.5 MG capsule Take 1 capsule (12.5 mg total) by mouth daily.  30 capsule  11  . oxyCODONE-acetaminophen (PERCOCET/ROXICET) 5-325 MG per tablet May take 1-2 tablets PO every 4 to 6 hours for severe pain - Do not take with Tylenol as this tablet already contains tylenol  15 tablet  0  . polyethylene glycol powder (GLYCOLAX/MIRALAX) powder Take 17 g by mouth 2 (two) times daily as needed.  3350 g  1  . propranolol (INDERAL) 20 MG tablet Take 1 tablet (20 mg total) by mouth 2 (two) times daily.  60 tablet  11   No current facility-administered medications for this visit.     SMOKING HISTORY:  The patient has never smoked cigarettes.  HISTORY:  I am seeing Denecia Brunette today for followup of her recurrent invasive ductal carcinoma involving the residual left breast after the patient had undergone mastectomy and immediate saline reconstruction for high-grade DCIS in October 2004.  The invasive cancer was detected in July 2011.  Marquesha was last seen by [place] on 11/06/2012 and prior to that on 07/17/2012.  She is not on any treatment at this time, even though her tumor was hormone receptor positive.  A trial of tamoxifen  needed to be discontinued because the patient was having vaginal bleeding.  She is currently having hot flashes.  Alyne has been troubled with fibromyalgia involving her hands.  She is taking Neurontin for this with some improvement.  She has also had some abdominal pains probably related to constipation and irritable bowel syndrome.  She also at one point had a discharge from her right breast. Those symptoms have  resolved.  She did undergo evaluation of the right breast which included a ductogram that was negative.  At the present time Ronia seems to be doing fairly well.  She still has some abdominal pain, but it is better with MiraLAX.  She is without any new complaints today.  PHYSICAL EXAMINATION:  General:  She looks well.  Weight is 159 pounds 3.2 ounces, height 5 feet 1 inch, body surface area 1.76 sq m.  Vital Signs:  Blood pressure 129/91.  Other vital signs are normal.  HEENT: This is no scleral icterus.  Mouth and pharynx are benign.  There is no peripheral adenopathy palpable.  Heart and lungs:  Normal.  There is a right-sided Port-A-Cath that was flushed with heparin today.  This was originally placed on 05/28/2010.  Breasts:  Right breast is benign. There has been some surgery carried out around the nipple-areolar complex, scars are well healed.  Right breast is fairly dense, but no suspicious findings.  On the left side the patient has undergone a mastectomy with a saline implant.  No suspicious findings.  There is, unfortunately, asymmetry with the implant being smaller than the right breast.  Abdomen:  Benign with no organomegaly or masses palpable. Extremities:  No peripheral edema or clubbing.  No obvious lymphedema of the left arm.  Hands and fingers look puffy.  Neurologic:  Exam was normal.  LABORATORY DATA:  Today, white count 6.3, ANC 4.0, hemoglobin 12.1, hematocrit 35.1, platelets 211,000.  Chemistries today notable for an albumin of 3.4, glucose 101, BUN 12, creatinine 0.8, LDH 169.    IMAGING STUDIES: 1. Digital screening mammogram of the right breast was negative on 05/21/2011.  2. Chest x-ray, 2 view, from 06/10/2011 was negative.  3. Digital diagnostic left mammogram on 11/08/2011 showed no evidence of left breast recurrence. It was noted that the patient is due  for right screening mammogram in July 2013. Ultrasound of the left breast showed normal skin and  fat. No discrete mass was seen.  4. MRI of the head with and without IV contrast from 04/10/2012 showed a normal exam. No cause of the patient's headache was identified.  5. Chest x-ray 2 view on 07/18/2012 showed no radiographic evidence of acute cardiopulmonary process.Right subclavian Port-A-Cath tip projects over the proximal SVC.  6. Digital diagnostic unilateral right mammogram on 08/23/2012 showed no mammographic or sonographic evidence of malignancy, right breast. A clear nipple discharge from a single duct orifice was expressed at physical examination.  7. US abdomen complete on 08/24/2012 showed no acute finding. Negative for gallstones. Two echogenic hepatic lesions are likely hemangiomas. The larger is seen on a prior study. As the stability of the second lesion cannot be established, repeat ultrasound in 4-6 months is recommended.  8. Ductogram unilateral right on 08/23/2012 showed a normal right breast ductography. Since no filling defect was identified within the ductal system from which the nipple discharge was expressed, in the absence of a mammographic or sonographic finding, and given the fact that the initial nipple discharge was from multiple ducts, the discharge is likely benign.  Should the discharge recur and especially if it is bloody and arising from a single duct, repeat ductography may be beneficial at that time.  9. NM HIDA scan on 09/07/2012 showed the gallbladder began to visualize at 43 minutes. This indicates patency of the cystic duct. Delayed visualization may be associated with element of chronic cholecystitis. During CCK infusion the gallbladder contracted 75.1%. 30% or greater is normal range. During CCK infusion the patient reported experiencing abdominal discomfort.  10. Diagnostic abdominal x-ray 1 view on 11/03/2012 showed moderate colonic stool correlating with the provided history of constipation. Otherwise nonspecific abdomen with no signs of obstruction.  11. CT  scan of abdomen and pelvis with IV contrast on 12/17/2012 showed no acute findings in the abdomen or pelvis.  There were felt to be hepatic hemangiomas present, 1 measuring 2.5 cm in the superior right hepatic lobe at the hepatic dome and a 7 mm lesion located anteriorly seen on image 15.  These lesions were present on the PET scan carried out on 04/30/2010 and are unchanged.  There was a small amount of free fluid in the pelvis, presumably physiologic.   IMPRESSION AND PLAN:  Karmel continues to do well, now almost 3 years from the time of diagnosis of the invasive duct carcinoma.  Javonda was asking me about removing the port.  I told her that it was really up to her and that she could have it out if she wants to.  She is interested in having some reconstructive surgery either to enhance the saline implant on the left or perhaps a reductive mammoplasty on the right to afford her breast symmetry.  In looking at her chart, I realize that she is not on any hormonal treatment.  At this point, she probably could go back on the tamoxifen since she did undergo a vaginal hysterectomy, laparoscopic-assisted, on 08/30/2011.  She is having hot flashes, however, and the tamoxifen may potentiate this.  I am planning to see Rosela again in 4 months, at which time we will check CBC and chemistries.  We can discuss whether she wants to go on tamoxifen to complete a 5-year course.  She had been taking tamoxifen sporadically, but tamoxifen was discontinued in June 2012.  Her chemotherapy treatments concluded in October 2011 and thus, at the most, she was on the tamoxifen for perhaps 8 or 9 months.  We will plan to flush Nayvie's Port-A-Cath with heparin every 2 months. When I see her again in 4 months, which will be at the end of August, Raychell will be due for a chest x-ray as her most recent chest x-ray was on 07/18/2012.    ______________________________ Samul Dada, M.D. DSM/MEDQ   D:  02/20/2013  T:  02/21/2013  Job:  [id number]

## 2013-02-22 ENCOUNTER — Encounter: Payer: Self-pay | Admitting: Family Medicine

## 2013-02-26 ENCOUNTER — Ambulatory Visit (INDEPENDENT_AMBULATORY_CARE_PROVIDER_SITE_OTHER): Payer: No Typology Code available for payment source | Admitting: Psychology

## 2013-02-26 DIAGNOSIS — F32A Depression, unspecified: Secondary | ICD-10-CM

## 2013-02-26 DIAGNOSIS — F329 Major depressive disorder, single episode, unspecified: Secondary | ICD-10-CM

## 2013-02-26 NOTE — Assessment & Plan Note (Signed)
Report of mood today is euthymic.  Affect is consistent.  Thoughts are clear and goal directed.  Mildly guarded.  States that "no one can really know what [she] is going through unless they too have been through it."  Although she does not think she has depression, upon clarification, she thinks she has reasons for feeling the way that she does.  PHQ-9 of 19 last visit.  Impact on function is difficult to discern because of co-morbid conditions (fibromyalgia, migraine) and she does not have to push through to function if she doesn't feel like it (no children at home, does not work outside the home).  Able to meet in-home responsibilities sufficiently.  With the reframe on her symptoms (symptoms are present and even if she has "reason" to feel this way, they are still bothersome), she is open to try different medication management.  Discussed options.  She has an aunt who did not seem to do well on Zoloft per Berenis's recollection (stayed in bed all day, never left the house).  Michaeleen said she was on 37.5 mg of Effexor in the past for hot flashes and this helped some.  If she tolerated this medicine fine, would consider restarting Venlafaxine at a more robust dose.  Might also consider Buproprion - tends to be weight neutral and can be somewhat activating.  The downside would be further potential to disrupt sleep or increase anxiety.  NOTE:  Spoke with pharmacist, Dr. Raymondo Band about this.  Buproprion is not on the discount list.  Venlafaxine IMMEDIATE RELEASE is at [facility] (cheapest there).  Dr. Raymondo Band would recommend 75 mg bid dosing to start followed by a quick titration to 75 mg tid dosing.  Patient would need to be especially adherent due to short half-life of this medication.  Discussing pros and cons is recommended.  SSRI is another possibility.  Would also consider stopping the amitriptyline as it does not appear to be helpful for sleep (unless patient is tied to ie).    Will follow in one week.
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
## 2013-03-01 ENCOUNTER — Encounter: Payer: Self-pay | Admitting: Family Medicine

## 2013-03-05 ENCOUNTER — Ambulatory Visit (INDEPENDENT_AMBULATORY_CARE_PROVIDER_SITE_OTHER): Payer: No Typology Code available for payment source | Admitting: Psychology

## 2013-03-05 DIAGNOSIS — F329 Major depressive disorder, single episode, unspecified: Secondary | ICD-10-CM

## 2013-03-05 DIAGNOSIS — F32A Depression, unspecified: Secondary | ICD-10-CM

## 2013-03-05 NOTE — Progress Notes (Signed)
Victoria Delacruz presents for follow-up.  She has nothing new for the agenda.  Discussed caffeine use (minimal) and reviewed options for pharmacotherapy going forward.  She is meeting with Dr. Benjamin Stain tomorrow to discuss medication options.    Most bothersome symptom presently as feeling a lack of independence.  What she ended up talking about more specifically is her difficulty saying "no" to certain people.  She has been described as a "people pleaser" and her lack of assertiveness in certain situations leaves her feeling bad about herself.

## 2013-03-05 NOTE — Assessment & Plan Note (Signed)
Affect is bright.  Thoughts are clear and goal directed.  She ties her guilty feelings and behavior (saying yes when she doesn't want to) to not wanting to treat people the way she or others have been treated.  She has been in situations where she has enabled others (younger sister).  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  Discussed how to proceed.  See patient instructions for further plan.    As we were scheduling a follow-up appointment she initially couldn't make it on a particular day because she was going to do something for someone else.  When we couldn't find another appointment time, she accepted the appointment and said, "This is for me."  We may revisit when she comes in to see how she handled this.

## 2013-03-05 NOTE — Patient Instructions (Addendum)
Please schedule a follow-up for:  May 13th at 10:00. For your appointment tomorrow with Dr. Benjamin Stain, you will be talking about medication.  The medicine Effexor might be a good match.  It is a three times a day medicine.  You said you thought you could manage that.  It is cheapest at [facility] according to my research.  The pharmacist recommended 75 mg twice a day with an increase to 3 times a day as long as you tolerate it.  We also talked about stopping the amytriptyline.   We also talked a lot about assertiveness.  One thing to consider is how you are feeling when you say yes.  The goal is to make your yes a yes without resentment.  Ask yourself the following questions:  Can this person do this him or herself?  Am I being taken advantage of?  Am I feeling respected?  Once you ask these questions, you might be in a better place to offer a healthy answer.

## 2013-03-06 ENCOUNTER — Ambulatory Visit (INDEPENDENT_AMBULATORY_CARE_PROVIDER_SITE_OTHER): Payer: No Typology Code available for payment source | Admitting: Family Medicine

## 2013-03-06 ENCOUNTER — Encounter: Payer: Self-pay | Admitting: Family Medicine

## 2013-03-06 VITALS — BP 136/83 | HR 69 | Temp 98.3°F | Wt 159.0 lb

## 2013-03-06 DIAGNOSIS — C50919 Malignant neoplasm of unspecified site of unspecified female breast: Secondary | ICD-10-CM

## 2013-03-06 DIAGNOSIS — F32A Depression, unspecified: Secondary | ICD-10-CM

## 2013-03-06 DIAGNOSIS — K589 Irritable bowel syndrome without diarrhea: Secondary | ICD-10-CM | POA: Insufficient documentation

## 2013-03-06 DIAGNOSIS — F329 Major depressive disorder, single episode, unspecified: Secondary | ICD-10-CM

## 2013-03-06 MED ORDER — VENLAFAXINE HCL 75 MG PO TABS: ORAL_TABLET | ORAL | Status: DC

## 2013-03-06 NOTE — Progress Notes (Signed)
Subjective:     Patient ID: Victoria Delacruz, female   DOB: June 11, 1971, 42 y.o.   MRN: [id number]  CC - f/u depression  HPI  Victoria Delacruz is a 42 y.o. female with complex PMH including depression, fibromyalgia, migraines, breast cancer here for f/u of her depression.  1. Depression - Pt has met 3x with Dr. Pascal Lux and they have discussed increasing medical management and working on behavioral adjustments to target depression. Pt is currently on amitriptyline with discussed proposed plan to start effexor, with TID dosing due to pt inability to afford XR formulation. Pt is amenable but has questions regarding if it will also help treat her migraines. She was taking amitriptyline infrequently but worries about changing. On discussion with pharmacist, it appears Effexor has off-label indications for migraine and neuropathic pain. Patient also working on Personnel officer with Dr Pascal Lux.  2. Abdominal pain - Pt thinks this was all related to constipation-dominant IBS, due to miralax BID and vegetable laxative helping with bowel movements which has reduced pain. However, she worries about being dependent on these for a bowel movement. She thinks she eats fruit but does not eat much vegetable. She also drinks 4 16-oz bottles of water daily. Has started avoiding caffeine.  3. Breast cancer - Pt plans to follow up with Dr Derrell Lolling ([facility]) and Dr. Aurelio Jew (who put in her initial implant/reconstruction) to discuss reconstruction. Has follow-up with Oncology in August to re-discuss when port can come out. Until then, she is seeing them q6-8 weeks for flush.  Review of Systems - Per HPI  Past Medical History  Diagnosis Date  . ECTOPIC PREGNANCY 1997 and 2011    x 2  . Hypertension   . Fibromyalgia   . Fibroid   . Breast cancer 2004 and 2011  . Anemia   . Arthritis   . Dizziness   . Family history of breast cancer     grandmother  . Blood transfusion 05/17/11, 05/25/11    anemia  . Depression         Objective:   Physical Exam BP 136/83  Pulse 69  Temp(Src) 98.3 F (36.8 C) (Oral)  Wt 159 lb (72.122 kg)  BMI 30.06 kg/m2  LMP 04/23/2011 GEN: NAD, seated on exam table PULM: Normal effort PSYCH: Mood upbeat, affect congruent, normal speech, linear thought process    Assessment:     Victoria Delacruz is a 42 y.o. female with complex PMH including depression, fibromyalgia, migraines, breast cancer here for f/u of her depression.    Plan:     # Health Maintenance - Find out for me when your last TDAP shot was

## 2013-03-06 NOTE — Patient Instructions (Addendum)
It was good to see you today.  For your depression, let us start with effexor 75mg  twice daily with increase to three times daily after 4 days. This is indicated for depression, nerve-related pain, and migraines so I am hoping it will help. Stop amitryptiline. See me at the end of next week.  For constipation, continue laxatives and try a high-fiber diet.  If you have any thoughts of harming yourself or others, severe depression, or other concerning symptoms, seek care sooner.  Find out for me when your last TDAP shot was (tetanus).   Instructions from Dr. Pascal Lux: Please schedule a follow-up for: May 13th at 10:00.  For your appointment tomorrow with Dr. Benjamin Stain, you will be talking about medication. The medicine Effexor might be a good match. It is a three times a day medicine. You said you thought you could manage that. It is cheapest at [facility] according to my research. The pharmacist recommended 75 mg twice a day with an increase to 3 times a day as long as you tolerate it. We also talked about stopping the amytriptyline.  We also talked a lot about assertiveness. One thing to consider is how you are feeling when you say yes. The goal is to make your yes a yes without resentment. Ask yourself the following questions: Can this person do this him or herself? Am I being taken advantage of? Am I feeling respected? Once you ask these questions, you might be in a better place to offer a healthy answer.     High-Fiber Diet Fiber is found in fruits, vegetables, and grains. A high-fiber diet encourages the addition of more whole grains, legumes, fruits, and vegetables in your diet. The recommended amount of fiber for adult males is 38 g per day. For adult females, it is 25 g per day. Pregnant and lactating women should get 28 g of fiber per day. If you have a digestive or bowel problem, ask your caregiver for advice before adding high-fiber foods to your diet. Eat a variety of high-fiber foods  instead of only a select few type of foods.  PURPOSE  To increase stool bulk.  To make bowel movements more regular to prevent constipation.  To lower cholesterol.  To prevent overeating. WHEN IS THIS DIET USED?  It may be used if you have constipation and hemorrhoids.  It may be used if you have uncomplicated diverticulosis (intestine condition) and irritable bowel syndrome.  It may be used if you need help with weight management.  It may be used if you want to add it to your diet as a protective measure against atherosclerosis, diabetes, and cancer. SOURCES OF FIBER  Whole-grain breads and cereals.  Fruits, such as apples, oranges, bananas, berries, prunes, and pears.  Vegetables, such as green peas, carrots, sweet potatoes, beets, broccoli, cabbage, spinach, and artichokes.  Legumes, such split peas, soy, lentils.  Almonds. FIBER CONTENT IN FOODS Starches and Grains / Dietary Fiber (g)  Cheerios, 1 cup / 3 g  Corn Flakes cereal, 1 cup / 0.7 g  Rice crispy treat cereal, 1 cup / 0.3 g  Instant oatmeal (cooked),  cup / 2 g  Frosted wheat cereal, 1 cup / 5.1 g  Brown, long-grain rice (cooked), 1 cup / 3.5 g  White, long-grain rice (cooked), 1 cup / 0.6 g  Enriched macaroni (cooked), 1 cup / 2.5 g Legumes / Dietary Fiber (g)  Baked beans (canned, plain, or vegetarian),  cup / 5.2 g  Kidney beans (canned),  cup / 6.8 g  Pinto beans (cooked),  cup / 5.5 g Breads and Crackers / Dietary Fiber (g)  Plain or honey graham crackers, 2 squares / 0.7 g  Saltine crackers, 3 squares / 0.3 g  Plain, salted pretzels, 10 pieces / 1.8 g  Whole-wheat bread, 1 slice / 1.9 g  White bread, 1 slice / 0.7 g  Raisin bread, 1 slice / 1.2 g  Plain bagel, 3 oz / 2 g  Flour tortilla, 1 oz / 0.9 g  Corn tortilla, 1 small / 1.5 g  Hamburger or hotdog bun, 1 small / 0.9 g Fruits / Dietary Fiber (g)  Apple with skin, 1 medium / 4.4 g  Sweetened applesauce,  cup /  1.5 g  Banana,  medium / 1.5 g  Grapes, 10 grapes / 0.4 g  Orange, 1 small / 2.3 g  Raisin, 1.5 oz / 1.6 g  Melon, 1 cup / 1.4 g Vegetables / Dietary Fiber (g)  Green beans (canned),  cup / 1.3 g  Carrots (cooked),  cup / 2.3 g  Broccoli (cooked),  cup / 2.8 g  Peas (cooked),  cup / 4.4 g  Mashed potatoes,  cup / 1.6 g  Lettuce, 1 cup / 0.5 g  Corn (canned),  cup / 1.6 g  Tomato,  cup / 1.1 g Document Released: 10/18/2005 Document Revised: 04/18/2012 Document Reviewed: 01/20/2012 [facility] Patient Information 2013 [place], [place].

## 2013-03-06 NOTE — Assessment & Plan Note (Addendum)
Depression stable. Takes amitryptiline 50mg  but not daily. Has had 3 visits with Dr. Pascal Lux and they have discussed effexor, however patient cannot afford extended release formulation. Is willing to try BID titrated up to TID dosing. - Rx effexor 75mg  BID to titrate up to TID after 4 days (for depression and neuropathic pain, off-label use for migraines) (printed rx so pt can shop around) - Discontinuing amitryptiline. - Follow-up in 1-2 weeks. - Return precautions reviewed per AVS -PHQ-9 at follow-ups

## 2013-03-06 NOTE — Assessment & Plan Note (Signed)
Continue scheduled follow-up with oncology to discuss port, with Dr. Derrell Lolling with [facility] and Dr. Aurelio Jew to discuss reconstruction. - Pt to inform us if she needs our help organizing these specialists for f/u, though should not be a problem if she has seen them before.

## 2013-03-06 NOTE — Assessment & Plan Note (Signed)
-   Continue miralax BID for constipation.  - Provided list of fiber-rich foods in AVS

## 2013-03-12 ENCOUNTER — Telehealth: Payer: Self-pay | Admitting: Psychology

## 2013-03-12 NOTE — Telephone Encounter (Signed)
Changed appointment to the 19th at 10:00.  She has her hearing on the 14th and is stressed.  Told her she could call to discuss if needed.

## 2013-03-13 ENCOUNTER — Ambulatory Visit: Payer: No Typology Code available for payment source | Admitting: Psychology

## 2013-03-19 ENCOUNTER — Ambulatory Visit (INDEPENDENT_AMBULATORY_CARE_PROVIDER_SITE_OTHER): Payer: No Typology Code available for payment source | Admitting: Psychology

## 2013-03-19 DIAGNOSIS — F32A Depression, unspecified: Secondary | ICD-10-CM

## 2013-03-19 DIAGNOSIS — F329 Major depressive disorder, single episode, unspecified: Secondary | ICD-10-CM

## 2013-03-19 NOTE — Progress Notes (Signed)
Victoria Delacruz presents for follow-up.  She is about 8 days into taking Effexor.  Four of these have been at the 75 mg three times a day dosing.  She has had difficulty tolerating the medicine with a significant headache that resolved after the 2nd or 3rd day (leaving behind a more manageable headache) and with consistent, intense anergia.  She reports it has gotten slightly better but she still has difficulty meeting her household responsibilities.  She has stopped the amitriptyline.  She has not noticed any benefit to the medication.  She has a hearing for disability recently and was told it will be 60-90 days before she hears anything.    Focused on the concept of grief and loss some today.  She reports she hadn't considered the concept of loss so much but when asked about loss of independence, this resonated with her as this is the thing that is most difficulty for her to manage.

## 2013-03-19 NOTE — Assessment & Plan Note (Signed)
Report of mood is sad.  Most days and most of the day.  She is able to identify periods where she feels something other than sadness.  When she thinks about all that she has been through and all the changes that it has meant, she feels the sadness a great deal.  Affect is within a normal range.  She looks and behaves as though she is tired but she is able to smile appropriately and is conversant.    Tried to do some work around the outcome of the disability hearing - recognizing that either way, there will be feelings to manage.  This was a real challenge for her.  Provided her a bibliotherapy resource on loss / grief and asked her to let me know what parts (if any) resonated with her.  She plans on seeing Dr. Benjamin Stain Tuesday of next week.  She says she is committed to taking the medicine until that time.  If, at that time, she is having the same difficulty tolerating the medicine, I would think we would have to look to alternatives as being able to be productive in some fashion is very important to her [name].

## 2013-03-27 ENCOUNTER — Ambulatory Visit: Payer: No Typology Code available for payment source | Admitting: Family Medicine

## 2013-03-30 ENCOUNTER — Ambulatory Visit (HOSPITAL_COMMUNITY)
Admission: RE | Admit: 2013-03-30 | Discharge: 2013-03-30 | Disposition: A | Discharge status: Home / Self Care | Payer: No Typology Code available for payment source | Source: Ambulatory Visit | Attending: Family Medicine | Admitting: Family Medicine

## 2013-03-30 ENCOUNTER — Ambulatory Visit (INDEPENDENT_AMBULATORY_CARE_PROVIDER_SITE_OTHER): Payer: No Typology Code available for payment source | Admitting: Family Medicine

## 2013-03-30 VITALS — BP 137/97 | HR 60 | Temp 98.3°F | Ht 63.0 in | Wt 153.0 lb

## 2013-03-30 DIAGNOSIS — R0602 Shortness of breath: Secondary | ICD-10-CM

## 2013-03-30 DIAGNOSIS — X58XXXA Exposure to other specified factors, initial encounter: Secondary | ICD-10-CM | POA: Insufficient documentation

## 2013-03-30 DIAGNOSIS — R229 Localized swelling, mass and lump, unspecified: Secondary | ICD-10-CM | POA: Insufficient documentation

## 2013-03-30 DIAGNOSIS — F329 Major depressive disorder, single episode, unspecified: Secondary | ICD-10-CM

## 2013-03-30 DIAGNOSIS — G43909 Migraine, unspecified, not intractable, without status migrainosus: Secondary | ICD-10-CM

## 2013-03-30 DIAGNOSIS — F32A Depression, unspecified: Secondary | ICD-10-CM

## 2013-03-30 DIAGNOSIS — S7010XA Contusion of unspecified thigh, initial encounter: Secondary | ICD-10-CM | POA: Insufficient documentation

## 2013-03-30 DIAGNOSIS — Z853 Personal history of malignant neoplasm of breast: Secondary | ICD-10-CM | POA: Insufficient documentation

## 2013-03-30 LAB — CBC
HCT: 38.3 % (ref 36.0–46.0)
Hemoglobin: 13.2 g/dL (ref 12.0–15.0)
MCH: 31.4 pg (ref 26.0–34.0)
MCHC: 34.5 g/dL (ref 30.0–36.0)
MCV: 91.2 fL (ref 78.0–100.0)
Platelets: 260 10*3/uL (ref 150–400)
RBC: 4.2 MIL/uL (ref 3.87–5.11)
RDW: 14.4 % (ref 11.5–15.5)
WBC: 6.2 10*3/uL (ref 4.0–10.5)

## 2013-03-30 LAB — PROTIME-INR
INR: 0.99 (ref <1.50)
Prothrombin Time: 13.1 seconds (ref 11.6–15.2)

## 2013-03-30 LAB — TSH: TSH: 0.509 u[IU]/mL (ref 0.350–4.500)

## 2013-03-30 MED ORDER — FLUOXETINE HCL 20 MG PO TABS: 20.0000 mg | ORAL_TABLET | Freq: Every day | ORAL | Status: DC

## 2013-03-30 NOTE — Assessment & Plan Note (Signed)
Exam is not concerning (normal O2 saturation, no unilateral erythema or swelling, no cord). However, bruising and right leg pain raise concern for DVT in patient with h/o malignancy and now shortness of breath. Pt is also very anxious about this and anxiety could be playing a role. No h/o asthma or allergies, and no wheezing or crackles on exam. No fevers to raise concern for infection. - CBC to check PLT and PT/INR with leg bruising - LE venous duplex in pt with dyspnea, h/o cancer, and unilateral leg pain who is very concerned about clot - TSH with SOB

## 2013-03-30 NOTE — Assessment & Plan Note (Addendum)
Pt was previously on amitryptiline with inadequate treatment of depressive symptoms. About 1 month ago started effexor which patient did not tolerate (perceived side effects of shortness of breath, leg bruising, and increased headaches). - PHQ-9 score 12, from 14 on 4-14 (today, scored highest in sleep and energy, then appetite and interest areas). - Discussed with pt, who has not tried other medications besides amitryptiline and effexor, but wants to stay away from zoloft because she states her aunt was too knocked out when she took it. Has no other preference for what to start. - Together, decided to start prozac 20mg  daily. Pt has stopped effexor and amitriptyline. - F/u in 2-4 weeks, and can decide to increase to 40mg  daily at 4 weeks per pt symptoms - Told patient antidepressants often take 4-6 weeks to reach effectiveness - TSH today - Return precautions per AVS, including SI/HI

## 2013-03-30 NOTE — Assessment & Plan Note (Signed)
Some worsening of symptoms but not pt's most concerning symptom today. - Has propranolol that can help with prophylaxis - Motrin for breakthrough pain - SSRIs may help some individuals with migraines - If intractable pain, further eval and consider imitrex but monitor for serotonin syndrome (risk <1%)

## 2013-03-30 NOTE — Patient Instructions (Addendum)
It was great to see you again!  For your depression,  - You have stopped taking effexor. Start taking prozac 20mg  daily in the morning.  - Remember, it usually takes 4-6 weeks for you to start feeling positive effects from any antidepressant. - If you have any unwanted side effects, please let me know. - Follow up in 2-4 weeks with me to see how you are doing. I will let you decide based on how you are feeling. - If you need to see me sooner for any concerns, you can always come in and see me or whoever is available.  For your leg pain and shortness of breath, - I want you to get a lower extremity doppler ultrasound. - We are checking some labs today, and I will call you if they are abnormal. If you do not hear from me with a call or letter in 2 weeks, please call us as I may have been unable to reach you.   For your headaches - Your propranolol helps keep them from starting. - If you get a headache, use motrin for breakthrough pain. - If this does not work, we may need to evaluate further. Let me know. - Prozac can also help with migraines in some individuals.  - Bring all medications in a bag to your visits.  Take care and seek immediate care sooner if you develop any concerns.   Dr. Benjamin Stain

## 2013-03-30 NOTE — Progress Notes (Signed)
Patient ID: Victoria Delacruz, female   DOB: May 27, 1971, 42 y.o.   MRN: [id number] Subjective:   CC: "I just can't take the medicine" (effexor)  HPI: Victoria Delacruz is a 42 y.o. female with h/o breast cancer, fibromyalgia, and depression here for inability to tolerate effexor and symptoms of leg bruising and pain, shortness of breath, and anergia since starting effexor.   1. Depression - Pt states she "can't take the medicine" referring to effexor. She has been taking it BID for 2 weeks and was not able to increase to TID dosing due to having difficulty with energy "unable to get out of bed", headaches, and shortness of breath since 2 days after starting it. She has had to force herself to "get up and do." Due to this, she stopped taking effexor last week. Also reports decreased sex drive and some dizziness improved with lying down. Denies syncope.   2. Shortness of breath - Pt reports shortness of breath that started 2 days after starting effexor and has continued since then despite stopping medication last week. It is worse when outdoors and in the humidity, and is causing her lots of anxiety. She has quick short inhalations every minute and is very frustrated with it, as it also occurs constantly at night when trying to sleep and is making her toss and turn. She has had these symptoms in the past. She has a family friend who had a DVT and PE and she is concerned about this. Denies h/o asthma and allergies. Reports a little chest and back pain. Symptoms are constant, nothing makes them better. She has had right leg pain 7/10 (described below) with no unlitateral swelling or erythema, chest pain, palpitations, or syncope.  3. Leg pain and bruising - 7/10 pain mostly right lower extremity also starting 2 days after starting effexor. She noticed bruises this week and is concerned. She denies trauma or difficulty walking, other than right calf tightness. Bruises are mildly sore and feel firm to the touch.  There is one on right upper anteriolateral thigh and one on left posterior thigh. She has had some bilateral foot swelling but denies unilateral edema or erythema. Denies new medication or bug bites.    Review of Systems - Per HPI; all other systems reviewed and negative.  Past Medical History  Diagnosis Date  . ECTOPIC PREGNANCY 1997 and 2011    x 2  . Hypertension   . Fibromyalgia   . Fibroid   . Breast cancer 2004 and 2011  . Anemia   . Arthritis   . Dizziness   . Family history of breast cancer     grandmother  . Blood transfusion 05/17/11, 05/25/11    anemia  . Depression   Medication: early May 2014 started effexor and stopped amitriptyline History of breast cancer s/p mastectomy  SH:  History  Substance Use Topics  . Smoking status: Never Smoker   . Smokeless tobacco: Never Used  . Alcohol Use: No  Denies new stressors.  FH: Reports FH of blood clot in father     Objective:  Physical Exam BP 137/97  Pulse 60  Temp(Src) 98.3 F (36.8 C) (Oral)  Ht 5\' 3"  (1.6 m)  Wt 153 lb (69.4 kg)  BMI 27.11 kg/m2  LMP 04/23/2011 GEN: NAD, pleasant, appears unhappy CV: RRR, no murmurs, rubs, or gallops PULM: CTAB, no wheezes or crackles, occasional sharp inhalations every minute that provide discomfort, O2 saturation 98% ABD: Soft, nontender, nondistended, NABS EXTR: Right leg  tenderness throughout, especially anterior lower leg, less so posterior thigh, no left leg pain; left upper anterior lateral thigh 4cm blue bruising with firmness just underneath, right posterior thigh with bruise but no firmness, right calf "tightness" with gait; no erythema, edema, asymmetry, or cord palpated; mild calf tenderness right side; 1+ bilateral DP pulses NEURO: Alert, no focal deficit, normal speech and gait, EOMI PSYCH: "worried and tired", affect congruent and appropriate PHQ-9 score 12 with no SI/HI  Assessment:    Victoria Delacruz is a 42 y.o. female with h/o breast cancer,  fibromyalgia, and depression here for inability to tolerate effexor and symptoms of leg bruising and pain, shortness of breath, and anergia since starting effexor.     Plan:     # Health maintenance:  - Follow-up in 2-4 weeks for depression follow-up - Bring all medications to each visit - Find out for me when last TDAP was  # See problem list for problem-specific plans.

## 2013-03-30 NOTE — Progress Notes (Signed)
VASCULAR LAB PRELIMINARY  PRELIMINARY  PRELIMINARY  PRELIMINARY  Bilateral lower extremity venous duplex completed.    Preliminary report:  Bilateral:  No evidence of DVT, superficial thrombosis, or Baker's Cyst.   Maurisha Mongeau, RVS 03/30/2013, 4:33 PM

## 2013-04-02 ENCOUNTER — Telehealth: Payer: Self-pay | Admitting: Family Medicine

## 2013-04-02 ENCOUNTER — Encounter: Payer: Self-pay | Admitting: Family Medicine

## 2013-04-02 NOTE — Telephone Encounter (Signed)
Blue Team, please call patient to inform her that LE duplex venous US was normal with no signs of a DVT. Thank you.  Simone Curia 04/02/2013 8:29 PM

## 2013-04-03 ENCOUNTER — Ambulatory Visit (INDEPENDENT_AMBULATORY_CARE_PROVIDER_SITE_OTHER): Payer: No Typology Code available for payment source | Admitting: Psychology

## 2013-04-03 DIAGNOSIS — F329 Major depressive disorder, single episode, unspecified: Secondary | ICD-10-CM

## 2013-04-03 DIAGNOSIS — F32A Depression, unspecified: Secondary | ICD-10-CM

## 2013-04-03 NOTE — Progress Notes (Signed)
Victoria Delacruz presented for follow-up.  She is frustrated with her medical care.  She has this "gasping" going on that looks somewhat like a hiccup but isn't.  She says it is constant but varies in frequency and is worst when she goes to bed at night.  She also has had a great deal of difficulty with headache recently.  She does not know what the gasping is but thinks it might be related to her lungs.  She has struggled with headache for a time and states that it is worse now and the biggest thing that interferes with her function.  She stayed in bed Saturday night through Sunday only getting up to shower.  Better on Monday but states the headache is still an 8.5 out of 10.  Did not tolerate the Effexor.  Thought it might have caused the gasping and headache but has stopped it and those things have persisted.  Has not yet started the Prozac.  Feels alone and like no one understands her situation.  Read the book on grief.

## 2013-04-03 NOTE — Patient Instructions (Addendum)
Please schedule a follow-up for:  June 17th at 10:00. I would recommend you schedule an appointment as soon as possible to talk about your breathing and perhaps even more importantly, your headache, with your physician.  The headache is getting in the way of your function to such a degree that it is likely making your mood worse. If you start to take the Prozac and your mood improves, you may notice your pain improves as well.  Mood and pain can run together. As far as I can tell, you have been on amitriptyline and perhaps propranolol for help with your headaches.  I see on your med list from 05/2011 sumatriptan (Imitrex) which is used to treat headaches.  You wanted me to write the name of that med down for you.

## 2013-04-03 NOTE — Telephone Encounter (Signed)
Pt is aware of results but would like to know why she is still having trouble breathing.  States that it has been 2 weeks off the medication but still having the shortness of breath. Please advise.

## 2013-04-03 NOTE — Assessment & Plan Note (Addendum)
Report of mood remains depressed.  Affect is mildly flat.  She appears tired.  Reports a headache.  Her gasping was quite noticeable at times and then when engaged in a story stopped until I brought it to her attention again.  Asked her if she thought stress could be playing a role and she wasn't sure.  It seemed almost tic like to me.  ? Somatoform process vs. Physiological.  She is very somatically focused.  She is interested in getting her headaches reevaluated in hopes of starting on a medicine that might be able to manage them better.  Initially said she didn't get much out of the book but when discussing it, it seems like she had a more fuller understanding of the message.  Validating her suffering seems to be the most effective way to support her short-term however I coupled that with a more proactive stance regarding her functioning - namely, suggesting she get her headaches and breathing evaluated and soon.  It looks like she followed through with an appointment with Dr. Armen Pickup.  Dr. Lucienne Minks schedule was booked until the point no schedules were available.    She thinks she will start taking the Prozac tomorrow and is hoping she doesn't have any side effects. Will follow on June 17th at 10:00.

## 2013-04-05 ENCOUNTER — Telehealth: Payer: Self-pay

## 2013-04-05 NOTE — Telephone Encounter (Signed)
I am not convinced shortness of breath was from medication.  That being said, I do not know why she is short of breath. Our workup from clinic found normal TSH, normal WBC (unlikely infection), normal hemoglobin (unlikely anemia). I think some of this could be related to anxiety. We can continue to evaluate in clinic if this is not getting any better, and I recommend continued follow-up with Dr. Pascal Lux.   For her bruising, platelets and INR also normal, making bruising from thin blood unlikely as well.  Called and explained this to patient, and she voiced understanding but also significant frustration and concern at not knowing the cause of her shortness of breath (unchanged, hear her take short quick inhalations every few minutes on the phone, but otherwise able to maintain conversation and respiration sounds unlabored), and at bruises of last week (resolving, no new bruises). She feels like at 42 she should not have this many medical problems.   When we discussed anxiety as a possible cause, pt did not outright deny this possibility, but wanted to know what was meant by it. I explained that anyone can have anxiety AKA stress AKA nervousness about an undiagnosed symptom, and this was normal but could also significantly contribute to the symptom. I told her I did not know if this was the only problem, but I thought it was contributing.  Discussed how we have had negative workup for some dangerous or common diagnoses (DVT, infection, thyroid disease, anemia, elevated INR or low PLT). Stated we may not know the dx but that we should follow-up regularly to continue evaluating pt, especially if she had any changes to symptoms. Encouraged frequent follow-up with [facility] and with Oncology as we know her very well. Pt voiced understanding, reports having an appointment in clinic with Dr. Armen Pickup and with Dr. Pascal Lux on the 17th, and stated appreciation for the call. Return precautions reviewed.    Victoria Delacruz  04/05/2013 6:55 PM

## 2013-04-05 NOTE — Telephone Encounter (Signed)
Pt called with c/o SOB and bruising on legs. Called back and pt states the bruising on legs is for about 2 weeks. She saw her PCP and had ultrasound and ruled out blood clot. The SOB has been for about 3 1/2 weeks. Present when at rest, worse with activity, worse with heat and humidity, very little cough. No fever, migraines worse. Pt can be heard with an occassional inspiratory wheezy gasp over the phone. Conferred with Dr Arline Asp then called pt back and left voice message. Dr Arline Asp does not believe it is related to breast cancer. It may be some type of laryngospasm. He gave options of going to the ER or asking PCP to get a referral for respiratory MD.

## 2013-04-09 ENCOUNTER — Other Ambulatory Visit: Payer: Self-pay | Admitting: Family Medicine

## 2013-04-13 ENCOUNTER — Ambulatory Visit (INDEPENDENT_AMBULATORY_CARE_PROVIDER_SITE_OTHER): Payer: No Typology Code available for payment source | Admitting: Family Medicine

## 2013-04-13 ENCOUNTER — Encounter: Payer: Self-pay | Admitting: Family Medicine

## 2013-04-13 VITALS — BP 147/95 | HR 60 | Temp 98.6°F | Ht 63.0 in | Wt 155.0 lb

## 2013-04-13 DIAGNOSIS — I10 Essential (primary) hypertension: Secondary | ICD-10-CM

## 2013-04-13 DIAGNOSIS — F329 Major depressive disorder, single episode, unspecified: Secondary | ICD-10-CM

## 2013-04-13 DIAGNOSIS — G43909 Migraine, unspecified, not intractable, without status migrainosus: Secondary | ICD-10-CM

## 2013-04-13 DIAGNOSIS — F32A Depression, unspecified: Secondary | ICD-10-CM

## 2013-04-13 MED ORDER — DEXAMETHASONE SODIUM PHOSPHATE 10 MG/ML IJ SOLN
10.0000 mg | Freq: Once | INTRAMUSCULAR | Status: AC
  Administered 2013-04-13: 10 mg via INTRAMUSCULAR

## 2013-04-13 MED ORDER — SUMATRIPTAN SUCCINATE 25 MG PO TABS: ORAL_TABLET | ORAL | Status: DC

## 2013-04-13 MED ORDER — HYDROCHLOROTHIAZIDE 25 MG PO TABS: 25.0000 mg | ORAL_TABLET | Freq: Every day | ORAL | Status: DC

## 2013-04-13 MED ORDER — FLUOXETINE HCL 20 MG PO TABS: 20.0000 mg | ORAL_TABLET | Freq: Every day | ORAL | Status: DC

## 2013-04-13 MED ORDER — KETOROLAC TROMETHAMINE 30 MG/ML IJ SOLN
30.0000 mg | Freq: Once | INTRAMUSCULAR | Status: AC
  Administered 2013-04-13: 30 mg via INTRAMUSCULAR

## 2013-04-13 NOTE — Assessment & Plan Note (Signed)
A: unchanged. Non compliant with medication. P: patient will start prozac. F/u with PCP in 2-3 weeks.

## 2013-04-13 NOTE — Assessment & Plan Note (Signed)
A: above goal. Recent normal BMP. P: increase HCTZ to 25 mg daily

## 2013-04-13 NOTE — Assessment & Plan Note (Signed)
Prescribed imitrex. Discussed very low risk of serotonin syndrome and need to d/c medication and present to care if she develops fever and muscle stiffness. Patient agreeable to this.  I recommend exploring other options for migraine prophylaxis like CCB or Topamax.  I would not increase propranolol dose given HR. Likely wean off propranolol as patient complains of fatigue.

## 2013-04-13 NOTE — Patient Instructions (Addendum)
[name],  Thank you for coming in today. Please note medication changes on your AVS.  Start prozac. F/u with Dr. Karie Schwalbe in 2-3 weeks after starting the medication. Present to care if you develop fever, muscle stiffness while taking prozac and imitrex as these may be signs of the rare condition we discussed call serotonin syndrome.  Dr. Armen Pickup

## 2013-04-13 NOTE — Progress Notes (Signed)
Subjective:     Patient ID: Victoria Delacruz, female   DOB: May 31, 1971, 42 y.o.   MRN: [id number]  HPI 42 year old female presents for same-day visit to discuss the following:  1. Chronic headaches: patient has history of migraine headaches for which she takes propanolol daily.  She reports 10 migraines monthly. She describes as left-sided pain that radiates from her left side to the back of her neck. Her current pain is bilateral frontal, tension, 7/10. She denies change in vision, weakness and sensory changes. She takes extra strength excedrin.   2. Hypertension: patient is compliant with hydrochlorothiazide 12.5 mg daily propanolol. She is to headaches. She denies chest pain, dizziness, lightheadedness.  She admits to intermittent gasping. To start after she was started on Effexor. He has been worked up and is attributed to her mood disorder. In her  3. Depression:  has not started Prozac. Admits to depressed mood.  Review of Systems As per HPI  Fatigue     Objective:   Physical Exam BP 147/95  Pulse 60  Temp(Src) 98.6 F (37 C) (Oral)  Ht 5\' 3"  (1.6 m)  Wt 155 lb (70.308 kg)  BMI 27.46 kg/m2  LMP 04/23/2011 General appearance: alert, cooperative and no distress. Tearful during exam Head: Normocephalic, without obvious abnormality, atraumatic Neck: supple, symmetrical, trachea midline, Full ROM. TTP L lateral w/o swelling or redness.  Lungs: clear to auscultation bilaterally Heart: regular rate and rhythm, S1, S2 normal, no murmur, click, rub or gallop\ Neuro: grossly normal  Gave migraine cocktail of toradol 30 and  decadron 10     Assessment and Plan:

## 2013-04-17 ENCOUNTER — Telehealth: Payer: Self-pay | Admitting: Psychology

## 2013-04-17 ENCOUNTER — Ambulatory Visit: Payer: No Typology Code available for payment source | Admitting: Psychology

## 2013-04-17 NOTE — Telephone Encounter (Signed)
Victoria Delacruz called at 10:15 to say she was not attending her 10:00 appointment.  She is not feeling well and though her appointment was at 11:00.  She continues to have headaches that she thinks are tension (so is not taking Imitrex) and she continues with the gasping which she thinks might be stress related.  She is wondering what she can do to manage the stress because she is not really perceiving "stress."  She has not started the Prozac.  She said she would start the Prozac today but is scared to take it.  Given her ambivalence, I suspect she will have difficulty tolerating it.  Other options to manage her "stress" include meditation and relaxation training.  I will look into some bibliotherapy resources for her.  She said she was going to walk on her treadmill as an attempt to help things.    Will follow a week from today at 10:00.

## 2013-04-23 ENCOUNTER — Ambulatory Visit (HOSPITAL_BASED_OUTPATIENT_CLINIC_OR_DEPARTMENT_OTHER): Payer: No Typology Code available for payment source

## 2013-04-23 VITALS — BP 138/95 | HR 64 | Temp 97.7°F

## 2013-04-23 DIAGNOSIS — Z452 Encounter for adjustment and management of vascular access device: Secondary | ICD-10-CM

## 2013-04-23 DIAGNOSIS — C50419 Malignant neoplasm of upper-outer quadrant of unspecified female breast: Secondary | ICD-10-CM

## 2013-04-23 DIAGNOSIS — C50919 Malignant neoplasm of unspecified site of unspecified female breast: Secondary | ICD-10-CM

## 2013-04-23 MED ORDER — SODIUM CHLORIDE 0.9 % IJ SOLN
10.0000 mL | INTRAMUSCULAR | Status: DC | PRN
  Administered 2013-04-23: 10 mL via INTRAVENOUS
  Filled 2013-04-23: qty 10

## 2013-04-23 MED ORDER — HEPARIN SOD (PORK) LOCK FLUSH 100 UNIT/ML IV SOLN
500.0000 [IU] | Freq: Once | INTRAVENOUS | Status: AC
  Administered 2013-04-23: 500 [IU] via INTRAVENOUS
  Filled 2013-04-23: qty 5

## 2013-04-23 NOTE — Patient Instructions (Addendum)
Call MD for problems 

## 2013-04-24 ENCOUNTER — Ambulatory Visit: Payer: No Typology Code available for payment source | Admitting: Psychology

## 2013-04-25 ENCOUNTER — Ambulatory Visit (INDEPENDENT_AMBULATORY_CARE_PROVIDER_SITE_OTHER): Payer: No Typology Code available for payment source | Admitting: Psychology

## 2013-04-25 DIAGNOSIS — F329 Major depressive disorder, single episode, unspecified: Secondary | ICD-10-CM

## 2013-04-25 DIAGNOSIS — F32A Depression, unspecified: Secondary | ICD-10-CM

## 2013-04-25 NOTE — Assessment & Plan Note (Signed)
Did not report on mood.  Suspect she would have said depressed however her affect was brighter today.  Her report of function seems markedly different.  She said she asked her sister if her 40 y.o. Niece come for the summer.  The amount of energy and effort this would take seems like it would be more than what she is used to.  We both think it would have been good for her but unfortunately, the sister said no.  Victoria Delacruz is also baking and planning a 4th of July cook-out with at her aunts.    Gasping was significantly less evident today.  Ambivalence around the Prozac remains quite high.  I won't hold my breath about her taking it tomorrow.  Respecting her autonomy.  The big thing is that she might have a tendency to be more sensitive to side effects.  She asked to follow next week.  Scheduled for 05/03/13 at 3:00.

## 2013-04-25 NOTE — Progress Notes (Signed)
Ardean presents for follow-up.  Focus of discussion was on limit setting - especially with family members.  Every time they contact her, even if they ask how she is doing, there is something eventually that they say they need from her.  Her sense is that when she is in need, they are not available to her.  She has an aunt and uncle and several friends that provide a more balanced relationship.  However, because these family members are so central to her (mother, sisters), the feeling of being used is especially difficult to manage.    Has not yet started the Prozac.  Plans on starting it tomorrow.

## 2013-05-03 ENCOUNTER — Ambulatory Visit (INDEPENDENT_AMBULATORY_CARE_PROVIDER_SITE_OTHER): Payer: No Typology Code available for payment source | Admitting: Psychology

## 2013-05-03 DIAGNOSIS — F3289 Other specified depressive episodes: Secondary | ICD-10-CM

## 2013-05-03 DIAGNOSIS — F329 Major depressive disorder, single episode, unspecified: Secondary | ICD-10-CM

## 2013-05-03 DIAGNOSIS — F32A Depression, unspecified: Secondary | ICD-10-CM

## 2013-05-03 NOTE — Progress Notes (Signed)
Victoria Delacruz presents for follow-up.  She reports she started taking 20 mg of Prozac on Monday.  She made the decision after talking to a friend whose daughter takes the medication and is doing fine.  She has tolerated it fine.  No safety issues to report.  No signs of efficacy which is not unexpected given the number of days she has been on it.    Continued assessment of assertiveness with family and friends.  Discussed situations including a friend who is regularly, overwhelmingly depressed, an uncle with MS who has children who do not help out and her mom and sister who continue to call and ask for things.  She is setting more limits with them.  Gasping has decreased significantly.  Energy is up.  Headaches remain.

## 2013-05-03 NOTE — Assessment & Plan Note (Signed)
Report of mood is euthymic.  Affect is consistent.  She talks about being "stressed" a lot - not sad but "stressed."  She looks great - well groomed and animated.  Moves well (no evidence of wincing or guarding).  Did a quick relaxation training exercise - centered on breathing and talked briefly about sympathetic / parasympathetic nervous system using the analogy of gas and brakes on a car.  She participated in the exercise.  Set a reasonable goal of 10 breaths twice a day and checking in on how she feels.  Will follow in two weeks or as needed.

## 2013-05-17 ENCOUNTER — Ambulatory Visit: Payer: No Typology Code available for payment source | Admitting: Psychology

## 2013-05-17 ENCOUNTER — Telehealth: Payer: Self-pay | Admitting: Psychology

## 2013-05-17 NOTE — Telephone Encounter (Signed)
Patient called to cancel her appointment secondary to her air conditioning going out and needing to be home for the service call.  We rescheduled for next week.  She got a letter stating her disability was denied and she is struggling with this.  Sounds good on the phone.  Able to wait for scheduled appointment.

## 2013-05-22 ENCOUNTER — Ambulatory Visit (INDEPENDENT_AMBULATORY_CARE_PROVIDER_SITE_OTHER): Payer: No Typology Code available for payment source | Admitting: Psychology

## 2013-05-22 DIAGNOSIS — F32A Depression, unspecified: Secondary | ICD-10-CM

## 2013-05-22 DIAGNOSIS — F329 Major depressive disorder, single episode, unspecified: Secondary | ICD-10-CM

## 2013-05-22 NOTE — Progress Notes (Signed)
Victoria Delacruz presents for follow-up.  She had considered canceling the appointment secondary to the belief that nothing is getting better and no one can help her.  She is feeling very defeated after getting the news that her disability was denied.  She thinks she needs to go find a job but her significant other is against it.  She thinks there are several barriers to her working.  She could also appeal the disability ruling.  This process could take up to a year or longer she was told.  This was the focus of our visit.

## 2013-05-22 NOTE — Assessment & Plan Note (Signed)
Hard to say if this is true ambivalence.  She sounds as though she is committed to working outside the home both because of the financial independence it provides her and because she is used to being busy (productive).  She thinks [name]'s resistance is based on her health but also wonders if he likes having her at home.  She says the pain and fatigue are significant and render her non-functional on occasion.  She did note that she has not had overwhelming fatigue in over a month.    Tried to gain clarity over importance of pursuing work.  If it is important to her, she needs to be able to have a conversation with [name] that covers the following: What she needs  Why she needs it How she is going to get it How she will know if it is compromising her health or relationship  We discussed this process and I wrote stuff down for her.  She elected to follow in one week.  She is ambivalent about coming to see me too it seems with the stated desire to cancel at the outset of the appointment and the desire to schedule one week at the close.

## 2013-05-31 ENCOUNTER — Ambulatory Visit (INDEPENDENT_AMBULATORY_CARE_PROVIDER_SITE_OTHER): Payer: No Typology Code available for payment source | Admitting: Psychology

## 2013-05-31 DIAGNOSIS — F329 Major depressive disorder, single episode, unspecified: Secondary | ICD-10-CM

## 2013-05-31 DIAGNOSIS — F32A Depression, unspecified: Secondary | ICD-10-CM

## 2013-05-31 NOTE — Assessment & Plan Note (Signed)
Report of mood is within normal limits. Affect is within normal limits.  Thoughts are clear and goal directed.  Ran through pros and cons worked on focusing in on what she needs and what she thinks is best independent of other people.  At session end, it sounds like she will appeal the disability determination and if it doesn't shift, she will reconsider a job search.

## 2013-05-31 NOTE — Progress Notes (Signed)
Victoria Delacruz presents for follow-up.  She could not recall her homework but remembered when prompted.  She didn't have other things for the agenda.  She has been in pain for about the past two weeks - she notes her joints ache.  She denies doing much of anything for fun but in passing mentions visiting certain people and a family reunion that she enjoyed this past weekend.  Per her homework, she and [name] discussed the job vs. Appealing the disability determination.  She has been soliciting opinions from others as well. She reports that not one person has said that trying to find a job is a good idea.  They think she is too unwell and that a job would potentially make her health worse.  Discussed.  Also talked about her desire to travel some.  She is feeling cooped up at home.  [name] doesn't like to travel and [place] lack of financial independence makes it difficult for her to push the issue.

## 2013-06-11 ENCOUNTER — Ambulatory Visit: Payer: No Typology Code available for payment source | Admitting: Psychology

## 2013-06-25 ENCOUNTER — Ambulatory Visit: Payer: No Typology Code available for payment source | Admitting: Family Medicine

## 2013-06-26 ENCOUNTER — Encounter: Payer: Self-pay | Admitting: Psychology

## 2013-06-26 ENCOUNTER — Telehealth: Payer: Self-pay | Admitting: Psychology

## 2013-06-26 ENCOUNTER — Ambulatory Visit (INDEPENDENT_AMBULATORY_CARE_PROVIDER_SITE_OTHER): Payer: No Typology Code available for payment source | Admitting: Psychology

## 2013-06-26 DIAGNOSIS — F32A Depression, unspecified: Secondary | ICD-10-CM

## 2013-06-26 DIAGNOSIS — F329 Major depressive disorder, single episode, unspecified: Secondary | ICD-10-CM

## 2013-06-26 NOTE — Assessment & Plan Note (Addendum)
Report of mood is a little down.  Affect is within normal limits.  She smiles and laughs appropriately.  Says she is taking medicine every night and is getting better sleep on it.  Not sure if it is helping otherwise.  Recent sadness on Saturday seems triggered by body image disturbance.  Does not have insurance for reconstruction.  Discussed other options including contacting a specialty store for help with bra options.    She was reluctant to ask me to write a letter for her for fear of rejection.  Discussed what I could and could not say in a letter.  She was fine with that.  Will have ready for her by Thursday.  She needs to write a letter of appeal herself.  Will ask her to bring a copy to share with me if okay with her.    Has decided to appeal the disability.
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
## 2013-06-26 NOTE — Telephone Encounter (Signed)
Called Victoria Delacruz to let her know the letter is ready for pick-up at the front desk.  It is being released to her for her to mail in with her other appeal documents.

## 2013-06-28 ENCOUNTER — Telehealth: Payer: Self-pay | Admitting: Family Medicine

## 2013-06-28 NOTE — Telephone Encounter (Signed)
She wants the nurse to call back as soon as possible. She has questions but would not say what the questions were [phone]

## 2013-06-28 NOTE — Telephone Encounter (Signed)
Returned call to pt and she stays that she is going through appeals process and needs letter regarding letter disability - illnesses, swelling of hands, fibromyg., depression, cancer, chronic migraines, IBS . She needs letter from her medical dr regarding these issues. PLease contact pt when ready. Wyatt Haste, RN-BSN

## 2013-06-29 ENCOUNTER — Encounter: Payer: Self-pay | Admitting: Family Medicine

## 2013-06-29 ENCOUNTER — Ambulatory Visit: Payer: No Typology Code available for payment source

## 2013-06-29 ENCOUNTER — Other Ambulatory Visit: Payer: No Typology Code available for payment source | Admitting: Lab

## 2013-06-29 NOTE — Telephone Encounter (Signed)
Please let patient know I can write a letter that just lists medical problems, but in my experience, that usually does little for the patient when it comes to disability. Disability has their own doctor that does the evaluation and often asks for access to medical records (I have been told). Patient needs to discuss this with disability office first. Ask her if she still prefers I write a letter (which will list her medical problems) though I suspect this will do little good with disability process.  Leona Singleton, MD

## 2013-06-29 NOTE — Telephone Encounter (Signed)
Victoria Delacruz, I have printed her letter and left it up front for her to pick up when ready. Please let her know. Thanks.

## 2013-06-29 NOTE — Telephone Encounter (Signed)
Pt called and informed. Elizabeth Dejean Tribby, RN-BSN  

## 2013-06-29 NOTE — Telephone Encounter (Signed)
Yes she would still like to have it - as it is an appeal and already has one from Dr. Pascal Lux - per pt. Victoria Haste, RN-BSN

## 2013-07-06 ENCOUNTER — Encounter: Payer: Self-pay | Admitting: Internal Medicine

## 2013-07-06 ENCOUNTER — Telehealth: Payer: Self-pay | Admitting: Internal Medicine

## 2013-07-06 ENCOUNTER — Ambulatory Visit (HOSPITAL_BASED_OUTPATIENT_CLINIC_OR_DEPARTMENT_OTHER): Payer: No Typology Code available for payment source | Admitting: Internal Medicine

## 2013-07-06 ENCOUNTER — Other Ambulatory Visit: Payer: Self-pay | Admitting: Medical Oncology

## 2013-07-06 ENCOUNTER — Other Ambulatory Visit (HOSPITAL_BASED_OUTPATIENT_CLINIC_OR_DEPARTMENT_OTHER): Payer: No Typology Code available for payment source

## 2013-07-06 VITALS — BP 122/89 | HR 56 | Temp 97.9°F | Resp 19 | Ht 63.0 in | Wt 153.4 lb

## 2013-07-06 DIAGNOSIS — C50912 Malignant neoplasm of unspecified site of left female breast: Secondary | ICD-10-CM

## 2013-07-06 DIAGNOSIS — C50919 Malignant neoplasm of unspecified site of unspecified female breast: Secondary | ICD-10-CM

## 2013-07-06 DIAGNOSIS — D509 Iron deficiency anemia, unspecified: Secondary | ICD-10-CM

## 2013-07-06 LAB — COMPREHENSIVE METABOLIC PANEL (CC13)
ALT: 10 U/L (ref 0–55)
AST: 17 U/L (ref 5–34)
Albumin: 3.6 g/dL (ref 3.5–5.0)
Alkaline Phosphatase: 64 U/L (ref 40–150)
BUN: 9.5 mg/dL (ref 7.0–26.0)
CO2: 25 mEq/L (ref 22–29)
Calcium: 9.1 mg/dL (ref 8.4–10.4)
Chloride: 106 mEq/L (ref 98–109)
Creatinine: 0.8 mg/dL (ref 0.6–1.1)
Glucose: 86 mg/dl (ref 70–140)
Potassium: 3 mEq/L — CL (ref 3.5–5.1)
Sodium: 140 mEq/L (ref 136–145)
Total Bilirubin: 0.59 mg/dL (ref 0.20–1.20)
Total Protein: 7.3 g/dL (ref 6.4–8.3)

## 2013-07-06 LAB — CBC WITH DIFFERENTIAL/PLATELET
BASO%: 0.7 % (ref 0.0–2.0)
Basophils Absolute: 0 10*3/uL (ref 0.0–0.1)
EOS%: 0.7 % (ref 0.0–7.0)
Eosinophils Absolute: 0 10*3/uL (ref 0.0–0.5)
HCT: 35 % (ref 34.8–46.6)
HGB: 12.1 g/dL (ref 11.6–15.9)
LYMPH%: 30.6 % (ref 14.0–49.7)
MCH: 32 pg (ref 25.1–34.0)
MCHC: 34.5 g/dL (ref 31.5–36.0)
MCV: 92.8 fL (ref 79.5–101.0)
MONO#: 0.4 10*3/uL (ref 0.1–0.9)
MONO%: 6.2 % (ref 0.0–14.0)
NEUT#: 4.2 10*3/uL (ref 1.5–6.5)
NEUT%: 61.8 % (ref 38.4–76.8)
Platelets: 260 10*3/uL (ref 145–400)
RBC: 3.77 10*6/uL (ref 3.70–5.45)
RDW: 13.7 % (ref 11.2–14.5)
WBC: 6.8 10*3/uL (ref 3.9–10.3)
lymph#: 2.1 10*3/uL (ref 0.9–3.3)

## 2013-07-06 LAB — LACTATE DEHYDROGENASE (CC13): LDH: 163 U/L (ref 125–245)

## 2013-07-06 MED ORDER — HEPARIN SOD (PORK) LOCK FLUSH 100 UNIT/ML IV SOLN
500.0000 [IU] | Freq: Once | INTRAVENOUS | Status: AC
  Administered 2013-07-06: 500 [IU] via INTRAVENOUS
  Filled 2013-07-06: qty 5

## 2013-07-06 MED ORDER — POTASSIUM CHLORIDE CRYS ER 20 MEQ PO TBCR: 20.0000 meq | EXTENDED_RELEASE_TABLET | Freq: Every day | ORAL | Status: DC

## 2013-07-06 MED ORDER — SODIUM CHLORIDE 0.9 % IJ SOLN
10.0000 mL | INTRAMUSCULAR | Status: DC | PRN
  Administered 2013-07-06: 10 mL via INTRAVENOUS
  Filled 2013-07-06: qty 10

## 2013-07-06 NOTE — Telephone Encounter (Signed)
gv and printed appt sched and avs for pt for NOV °

## 2013-07-06 NOTE — Patient Instructions (Addendum)

## 2013-07-06 NOTE — Progress Notes (Signed)
Hematology and Oncology Follow Up Visit  Victoria Delacruz [id number] Nov 03, 1970 42 y.o. 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   Prior Therapy: Following local excision of the tumor on 05/28/2010, she completed 4 cycles of adjuvant chemotherapy with cytoxan and taxotere in combination with neulasta from 06/08/2010 through 08/10/2010.  XRT was given to the left breast and chest wall from 09/08/2010 through 11/04/2010 consisting of 4780 in 26 fractions with an 1800 cGy boost in 9 fractions.    Current therapy: None  Interim History: Rhegan Trunnell continues to do well, now over 3 years from the time of diagnosis of the invasive duct carcinoma.  She still requests to remove her port. She was last seen by Dr. Arline Asp on 02/21/2013. As indicated on prior notes, she is not on tamoxifen as she recent bleeding complications requiring a vaginal hysterectomy, laparoscopic-assisted, on 08/30/2011. She took tamoxifen sporadically but had vaginal bleeding and it was discontinued June of 2012. Rayann has been troubled with fibromyalgia involving her hands.  She continues to take Neurontin for this with some improvement. She has undergone evaluation of the right breast which included a ductogram that was negative.  Presently, she appears to be fairly well.  She still has some abdominal pain, but it is better with MiraLAX.  She is without any new complaints today.  Medications: I have reviewed the patient's current medications.  Current Outpatient Prescriptions  Medication Sig Dispense Refill  . FLUoxetine (PROZAC) 20 MG tablet Take 1 tablet (20 mg total) by mouth daily. Take in the  morning.  30 tablet  1  . gabapentin (NEURONTIN) 300 MG capsule Take 1 capsule (300 mg total) by mouth at bedtime.  30 capsule  11  . hydrochlorothiazide (HYDRODIURIL) 25 MG tablet Take 1 tablet (25 mg total) by mouth daily.  90 tablet  3  . oxyCODONE-acetaminophen (PERCOCET/ROXICET) 5-325 MG per tablet May take 1-2 tablets PO every 4 to 6 hours for severe pain - Do not take with Tylenol as this tablet already contains tylenol  15 tablet  0  . polyethylene glycol powder (GLYCOLAX/MIRALAX) powder Take 17 g by mouth 2 (two) times daily as needed.  3350 g  1  . propranolol (INDERAL) 40 MG tablet TAKE ONE-HALF TABLET BY MOUTH TWICE DAILY  30 tablet  11  . SUMAtriptan (IMITREX) 25 MG tablet One tab my mouth at the onset of headache. May take an additional dose two hours later. Do not exceed two doses in a 7 day period.  10 tablet  0  . potassium chloride SA (K-DUR,KLOR-CON) 20 MEQ tablet Take 1 tablet (20 mEq total) by mouth daily.  5 tablet  0   Current Facility-Administered Medications  Medication Dose Route Frequency Provider Last Rate Last Dose  . sodium chloride 0.9 % injection 10 mL  10 mL Intravenous PRN Myra Rude, MD   10 mL at 07/06/13 1549     Allergies: No Known Allergies  PROBLEM LIST:  1. 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 Tumor was 4.0 cm. Sentinel lymph nodes were negative. Estrogen  receptor was 24%, progesterone receptor 2%. The patient underwent a saline implant.  2. Invasive duct carcinoma involving the residual left breast tissue in July 2011. The patient had a 6.0 mm subdermal recurrence in the upper outer quadrant of the left breast at about the 1-2 o'clock  position. She underwent local excision of the tumor on 05/28/2010 followed by 4 cycles of adjuvant chemotherapy with Cytoxan and Taxotere in combination with Neulasta from 06/08/2010 through  08/10/2010. Radiation treatments to the  left breast and chest wall were given from 09/08/2010 through 11/04/2010 consisting of 4780 cGy in 26 fractions with an 1800 cGy boost in 9 fractions. She  took tamoxifen sporadically for awhile, then started having vaginal bleeding and tamoxifen was ultimately discontinued in June of 2012.  3. Laparoscopic assisted vaginal hysterectomy 08/30/2011.  4. History of iron-deficiency anemia secondary to heavy periods and possibly fibroids treated with intravenous iron in the past.  5. Irritable bowel syndrome.  6. History of hypertension.  7. Family history of breast cancer, but negative genetic testing.  8. Right-sided Port-A-Cath placed on 05/28/2010, currently being maintained with heparin flushes every 2 months.  9. Fibromyalgia diagnosed in the fall 2011.  10. Migraine headaches which started around 2000. 11. Hemangiomas involving the liver first noted on a PET scan from 04/30/2010.  The CT scan of the abdomen and pelvis with IV contrast carried out on 12/17/2012 showed a 2.5 cm lesion in the superior right hepatic lobe at the hepatic dome and a 7 mm lesion seen on image 15.  Additional Past Medical History, Surgical history, Social history, and Family History were reviewed and updated.  Review of Systems: Constitutional:  Negative for fever, chills, night sweats, anorexia, weight loss, pain. Cardiovascular: no chest pain or dyspnea on exertion Respiratory: no cough, shortness of breath, or wheezing Neurological: no TIA or stroke symptoms Dermatological: negative for rash ENT: negative for - epistaxis Skin: Negative. Gastrointestinal: no abdominal pain, change in bowel habits, or black or bloody stools Genito-Urinary: no dysuria, trouble voiding, or hematuria Hematological and Lymphatic: negative for - bleeding problems Breast: negative for - new or changing breast lumps, nipple changes or nipple discharge Musculoskeletal: negative for - gait disturbance Remaining ROS negative. Physical  Exam: Blood pressure 122/89, pulse 56, temperature 97.9 F (36.6 C), temperature source Oral, resp. rate 19, height 5\' 3"  (1.6 m), weight 153 lb 6.4 oz (69.582 kg), last menstrual period 04/23/2011. ECOG: 0 General appearance: alert, cooperative, appears stated age and no distress Head: Normocephalic, without obvious abnormality, atraumatic Neck: no adenopathy, supple, symmetrical, trachea midline and thyroid not enlarged, symmetric, no tenderness/mass/nodules Lymph nodes: Cervical, supraclavicular, and axillary nodes normal. Heart:regular rate and rhythm, S1, S2 normal, no murmur, click, rub or gallop Lung:chest clear, no wheezing, rales, normal symmetric air entry, Heart exam - S1, S2 normal, no murmur, no gallop, rate regular Breast exam: R breast is benign w nipple-areolar complex, scars well healed.  No lumps or nipple retraction. L breast is s/p mastectomy with a saline implant smaller than the right breast. No lumps or masses appreciated.  Abdomin: soft, non-tender, without masses or organomegaly EXT:No peripheral edema Neuro: No focal deficits.   Lab Results: Lab Results  Component Value Date   WBC 6.8 07/06/2013   HGB 12.1 07/06/2013   HCT 35.0 07/06/2013   MCV 92.8 07/06/2013   PLT 260 07/06/2013     Chemistry      Component Value Date/Time   NA 140 07/06/2013 1445   NA 140 12/17/2012 1312   K 3.0* 07/06/2013 1445   K 3.7 12/17/2012 1312  CL 109* 02/20/2013 1044   CL 107 12/17/2012 1312   CO2 25 07/06/2013 1445   CO2 25 12/17/2012 1312   BUN 9.5 07/06/2013 1445   BUN 9 12/17/2012 1312   CREATININE 0.8 07/06/2013 1445   CREATININE 0.66 12/17/2012 1312   CREATININE 0.81 08/22/2012 1125      Component Value Date/Time   CALCIUM 9.1 07/06/2013 1445   CALCIUM 9.0 12/17/2012 1312   ALKPHOS 64 07/06/2013 1445   ALKPHOS 77 12/17/2012 1312   AST 17 07/06/2013 1445   AST 20 12/17/2012 1312   ALT 10 07/06/2013 1445   ALT 10 12/17/2012 1312   BILITOT 0.59 07/06/2013 1445   BILITOT 0.2* 12/17/2012 1312      Radiological Studies:  1. Digital screening mammogram of the right breast was negative on 05/21/2011.  2. Chest x-ray, 2 view, from 06/10/2011 was negative.  3. Digital diagnostic left mammogram on 11/08/2011 showed no evidence of left breast recurrence. It was noted that the patient is due for right screening mammogram in July 2013. Ultrasound of the left breast showed normal skin and fat. No discrete mass was seen.  4. MRI of the head with and without IV contrast from 04/10/2012 showed a normal exam. No cause of the patient's headache was identified.  5. Chest x-ray 2 view on 07/18/2012 showed no radiographic evidence of acute cardiopulmonary process.Right subclavian Port-A-Cath tip projects over the proximal SVC.  6. Digital diagnostic unilateral right mammogram on 08/23/2012 showed no mammographic or sonographic evidence of malignancy, right breast. A clear nipple discharge from a single duct orifice was expressed at physical examination.  7. US abdomen complete on 08/24/2012 showed no acute finding. Negative for gallstones. Two echogenic hepatic lesions are likely hemangiomas. The larger is seen on a prior study. As the stability of the second lesion cannot be established, repeat ultrasound in 4-6 months is recommended.  8. Ductogram unilateral right on 08/23/2012 showed a normal right breast ductography. Since no filling defect was identified within the ductal system from which the nipple discharge was expressed, in the absence of a mammographic or sonographic finding, and given the fact that the initial nipple discharge was from multiple ducts, the discharge is likely benign. Should the discharge recur and especially if it is bloody and arising from a single duct, repeat ductography may be beneficial at that time.  9. NM HIDA scan on 09/07/2012 showed the gallbladder began to visualize at 43 minutes. This indicates patency of the cystic duct. Delayed visualization may be associated with element  of chronic cholecystitis. During CCK infusion the gallbladder contracted 75.1%. 30% or greater is normal range. During CCK infusion the patient reported experiencing abdominal discomfort.  10. Diagnostic abdominal x-ray 1 view on 11/03/2012 showed moderate colonic stool correlating with the provided history of constipation. Otherwise nonspecific abdomen with no signs of obstruction.  11. CT scan of abdomen and pelvis with IV contrast on 12/17/2012 showed no acute findings in the abdomen or pelvis.  There were felt to be hepatic hemangiomas present, 1 measuring 2.5 cm in the superior right hepatic lobe at the hepatic dome and a 7 mm lesion located anteriorly seen on image 15.  These lesions were present on the PET scan carried out on 04/30/2010 and are unchanged.  There was a small amount of free fluid in the pelvis, presumably physiologic.  Impression and Plan: 1. Left breast invasive duct carcinoma (ER+,PR+,HER2 negative) as detailed above. - She is now over 3 years from the time of  diagnosis of the invasive duct carcinoma.  We had a discussion regarding starting adjuvant tamoxifen and she had concerns that it would cause cancer.  I expressed to her that given her hysterectomy that it would only cause additional cancer but has been proven to reduce the risk of ipsilateral recurrence or contralateral breast cancer when taken for 5 years.  Common side-effects was discussed including but not limited to hot flashes, n/v, vaginal discharge, bone pain, lightheadedness, dizziness, fatigue, peripheral edema, anorexia. Understanding the risks versus benefits, she agreed.  However, she is on prozac or fuoxetine 20 mg which is a strong CYP2D6 inhibitor and has been shown to decrease the formation of endoxifen, an active metabolite of tamoxifen; thereby, increasing the risk of breast cancer recurrence.  I sent a telephone to her psychologist and will discuss potentially discontiuing her prozac and favor or effexor which  has also been shown to reduce severity of hot flashes.    2. Hypokalemia, likely secondary to HTCZ.  She had a recent increase in her dose.  I instructed her to hold this medication for now.  I will discuss with her PCP regarding amlodipine.  She was given a supply of 100 mEQ KCL to take over the next 5 days.  She advised on symptoms of hypokalemia includes muscle twitching,palpitations.  I she notices increasing symptoms, she should present to the nearest emergency room for further evaluation.  She understood this plan.   3. Digital diagnostic unilateral right mammogram on 08/23/2012 showed no mammographic or sonographic evidence of malignancy, right breast. Next mammogram should be next month.  Obtain a cxr since her last cxr was 07/18/2012.   4. Iron deficiency anemia, resolved.  5. RTC  in 4 months, at which time we will check CBC and chemistries.    Spent more than half the time coordinating care.    Clotilde Loth, MD 9/7/20143:17 PM

## 2013-07-09 ENCOUNTER — Ambulatory Visit: Payer: Self-pay

## 2013-07-11 ENCOUNTER — Ambulatory Visit: Payer: Self-pay

## 2013-07-12 ENCOUNTER — Encounter: Payer: Self-pay | Admitting: Family Medicine

## 2013-07-12 ENCOUNTER — Ambulatory Visit (INDEPENDENT_AMBULATORY_CARE_PROVIDER_SITE_OTHER): Payer: No Typology Code available for payment source | Admitting: Family Medicine

## 2013-07-12 VITALS — BP 133/85 | HR 65 | Temp 98.2°F | Wt 155.0 lb

## 2013-07-12 DIAGNOSIS — E876 Hypokalemia: Secondary | ICD-10-CM | POA: Insufficient documentation

## 2013-07-12 DIAGNOSIS — IMO0001 Reserved for inherently not codable concepts without codable children: Secondary | ICD-10-CM

## 2013-07-12 DIAGNOSIS — I10 Essential (primary) hypertension: Secondary | ICD-10-CM

## 2013-07-12 DIAGNOSIS — F329 Major depressive disorder, single episode, unspecified: Secondary | ICD-10-CM

## 2013-07-12 DIAGNOSIS — M797 Fibromyalgia: Secondary | ICD-10-CM

## 2013-07-12 DIAGNOSIS — C50919 Malignant neoplasm of unspecified site of unspecified female breast: Secondary | ICD-10-CM

## 2013-07-12 DIAGNOSIS — Z0000 Encounter for general adult medical examination without abnormal findings: Secondary | ICD-10-CM | POA: Insufficient documentation

## 2013-07-12 DIAGNOSIS — F32A Depression, unspecified: Secondary | ICD-10-CM

## 2013-07-12 MED ORDER — CITALOPRAM HYDROBROMIDE 20 MG PO TABS: ORAL_TABLET | ORAL | Status: DC

## 2013-07-12 MED ORDER — AMLODIPINE BESYLATE 5 MG PO TABS: 5.0000 mg | ORAL_TABLET | Freq: Every day | ORAL | Status: AC

## 2013-07-12 NOTE — Assessment & Plan Note (Signed)
on HCTZ now with asymptomatic hypokalemia. BP mildly elevated today on just propranolol (off HCTZ 1 week). - Changing HCTZ to amlodipine 5mg , rx sent and pt informed of side effect profile - F/u in 2 weeks and check BMET at that time

## 2013-07-12 NOTE — Assessment & Plan Note (Signed)
Stable. Per oncologist, prozac is strong inhibitor of CYP2D6 which would reduce tamoxifen activity in preventing recurrence of breast cancer.  - After discussing with psychologist Dr Pascal Lux, pharmacist and pt, will change to celexa which has very weak CYP2D6 inhibition; pt has failed effexor in the past due to fatigue - Rx sent. Will call pt with instructions to stop prozac, 2 days later start celexa 10mg  daily x 3 days, then 20mg  daily - follow up in 2 weeks for symptom f/u, PHQ-9

## 2013-07-12 NOTE — Patient Instructions (Addendum)
It was good to see you today.  For your depression, I want you to take the tamoxifen as suggested by the oncologist. I will discuss changing from prozac to celexa with Dr. Pascal Lux and get back to you.  For your blood pressure, I will send a prescription for amlodipine to the pharmacy. Let me know of any side effects.  We will touch base in 2 weeks about both of these - make an appt.  Make an appointment with me whenever convenient for you to discuss your body pains. In the meantime, take 400-450mg  daily of gabapentin. Call if I need to re-send a prescription.  Leona Singleton, MD     Tamoxifen oral tablet What is this medicine? TAMOXIFEN (ta MOX i fen) blocks the effects of estrogen. It is commonly used to treat breast cancer. It is also used to decrease the chance of breast cancer coming back in women who have received treatment for the disease. It may also help prevent breast cancer in women who have a high risk of developing breast cancer. This medicine may be used for other purposes; ask your health care provider or pharmacist if you have questions. What should I tell my health care provider before I take this medicine? They need to know if you have any of these conditions: -blood clots -blood disease -cataracts or impaired eyesight -endometriosis -high calcium levels -high cholesterol -irregular menstrual cycles -liver disease -stroke -uterine fibroids -an unusual or allergic reaction to tamoxifen, other medicines, foods, dyes, or preservatives -pregnant or trying to get pregnant -breast-feeding How should I use this medicine? Take this medicine by mouth with a glass of water. Follow the directions on the prescription label. You can take it with or without food. Take your medicine at regular intervals. Do not take your medicine more often than directed. Do not stop taking except on your doctor's advice. A special MedGuide will be given to you by the pharmacist with each  prescription and refill. Be sure to read this information carefully each time. Talk to your pediatrician regarding the use of this medicine in children. While this drug may be prescribed for selected conditions, precautions do apply. Overdosage: If you think you have taken too much of this medicine contact a poison control center or emergency room at once. NOTE: This medicine is only for you. Do not share this medicine with others. What if I miss a dose? If you miss a dose, take it as soon as you can. If it is almost time for your next dose, take only that dose. Do not take double or extra doses. What may interact with this medicine? -aminoglutethimide -bromocriptine -chemotherapy drugs -female hormones, like estrogens and birth control pills -letrozole -medroxyprogesterone -phenobarbital -rifampin -warfarin This list may not describe all possible interactions. Give your health care provider a list of all the medicines, herbs, non-prescription drugs, or dietary supplements you use. Also tell them if you smoke, drink alcohol, or use illegal drugs. Some items may interact with your medicine. What should I watch for while using this medicine? Visit your doctor or health care professional for regular checks on your progress. You will need regular pelvic exams, breast exams, and mammograms. If you are taking this medicine to reduce your risk of getting breast cancer, you should know that this medicine does not prevent all types of breast cancer. If breast cancer or other problems occur, there is no guarantee that it will be found at an early stage. Do not become pregnant while  taking this medicine or for 2 months after stopping this medicine. Stop taking this medicine if you get pregnant or think you are pregnant and contact your doctor. This medicine may harm your unborn baby. Women who can possibly become pregnant should use birth control methods that do not use hormones during tamoxifen treatment  and for 2 months after therapy has stopped. Talk with your health care provider for birth control advice. Do not breast feed while taking this medicine. What side effects may I notice from receiving this medicine? Side effects that you should report to your doctor or health care professional as soon as possible: -changes in vision (blurred vision) -changes in your menstrual cycle -difficulty breathing or shortness of breath -difficulty walking or talking -new breast lumps -numbness -pelvic pain or pressure -redness, blistering, peeling or loosening of the skin, including inside the mouth -skin rash or itching (hives) -sudden chest pain -swelling of lips, face, or tongue -swelling, pain or tenderness in your calf or leg -unusual bruising or bleeding -vaginal discharge that is bloody, brown, or rust -weakness -yellowing of the whites of the eyes or skin Side effects that usually do not require medical attention (report to your doctor or health care professional if they continue or are bothersome): -fatigue -hair loss, although uncommon and is usually mild -headache -hot flashes -impotence (in men) -nausea, vomiting (mild) -vaginal discharge (white or clear) This list may not describe all possible side effects. Call your doctor for medical advice about side effects. You may report side effects to FDA at 1-800-FDA-1088. Where should I keep my medicine? Keep out of the reach of children. Store at room temperature between 20 and 25 degrees C (68 and 77 degrees F). Protect from light. Keep container tightly closed. Throw away any unused medicine after the expiration date. NOTE: This sheet is a summary. It may not cover all possible information. If you have questions about this medicine, talk to your doctor, pharmacist, or health care provider.  2013, [employer]. (07/04/2008 12:01:56 PM)

## 2013-07-12 NOTE — Assessment & Plan Note (Signed)
Worsened - Possible contribution of hypokalemia, which should be improving now that off HCTZ 1 week. - Increase gabapentin to 400mg  qhs - Follow up when convenient to evaluate body pains, previously dx'ed as fibromyalgia - Check BMET at f/u

## 2013-07-12 NOTE — Progress Notes (Signed)
Patient ID: Victoria Delacruz, female   DOB: 12-08-1970, 42 y.o.   MRN: [id number] Subjective:   CC: Follow up  HPI:   1. Depression/anxiety - Symptoms are stable; taking prozac nightly which she says is helping her sleep but not quite improving symptoms of depression. Recent oncology visit with Dr. Rosie Fate who recommended stopping prozac due to interaction (strong CYP2D6 inhibitor) with tamoxifen, reducing availability of its active metabolite. Patient is open to another option.  2. HTN - Patient has been taking HCTZ but was found by oncologist to have hypokalemia at 5, started on kdur at that time and stopped HCTZ 1 week ago. Asymptomatic with no chest pain, shortness of breath, or palpitations. Is having body pains that she relates to her fibromyalgia. Amenable to changing therapy.  3. Invasive duct carcinoma - Per recent oncology visit, planning to start tamoxifen. Low risk for ovarian cancer, no risk for endometrial cancer in pt s/p hysterectomy. Pt is amenable to starting this.  4. Fibromyalgia - Pt reports increased fibromyalgia pain in legs (left>right) and hips with stiffness. She uses gabapentin 300mg  nightly which initially helped but not anymore. Thinks she saw a rheumatologist once but insurance did not cover further visits. Unsure if she's had official workup.  Review of Systems - Per HPI.   PMH: - Invasive duct carcinoma left breast, recurrent, had started tamoxifen and stopped due to vaginal bleeding; - S/p hysterectomy, restarting tamoxifen    Objective:  Physical Exam BP 133/85  Pulse 65  Temp(Src) 98.2 F (36.8 C) (Oral)  Wt 155 lb (70.308 kg)  BMI 27.46 kg/m2  LMP 04/23/2011 GEN: NAD, pleasant, well-groomed HEENT: Atraumatic, normocephalic, neck supple, EOMI, sclera clear  CV: Skin warm and well-perfused PULM: Normal effort SKIN: No rash or cyanosis; warm and well-perfused EXTR: No lower extremity edema or calf tenderness PSYCH: Mood and affect euthymic, normal rate  and volume of speech NEURO: Awake, alert, no focal deficits grossly, normal speech    Assessment:     Victoria Delacruz is a 42 y.o. female with h/o invasive ductal carcinoma in situ, HTN, and depression/anxiety here for follow-up.    Plan:     # See problem list for problem-specific plans. - At follow up, offer flu shot and TDAP - ?pap smears - f/u on if pt needs given hysterectomy for bleeding but h/o recurrent DCIS. Prefer to err on the side of routine screening q3 years if normal, so pt would be due in 2015.

## 2013-07-12 NOTE — Assessment & Plan Note (Signed)
Asymptomatic with K 3.0, HCTZ stopped and Kdur started.  - Will start amlodipine for BP and recheck BMET at f/u in 1-2 weeks. - Return precautions reviewed

## 2013-07-12 NOTE — Assessment & Plan Note (Addendum)
Stable. Met with new oncologist. - about to start tamoxifen  - Will d/c prozac and start celexa per above

## 2013-07-13 ENCOUNTER — Other Ambulatory Visit: Payer: Self-pay | Admitting: Family Medicine

## 2013-07-13 DIAGNOSIS — G43909 Migraine, unspecified, not intractable, without status migrainosus: Secondary | ICD-10-CM

## 2013-07-13 DIAGNOSIS — M797 Fibromyalgia: Secondary | ICD-10-CM

## 2013-07-13 MED ORDER — GABAPENTIN 300 MG PO CAPS: 300.0000 mg | ORAL_CAPSULE | Freq: Every day | ORAL | Status: DC

## 2013-07-13 MED ORDER — GABAPENTIN 100 MG PO CAPS: ORAL_CAPSULE | ORAL | Status: DC

## 2013-07-13 NOTE — Telephone Encounter (Signed)
Called patient to let her know I am sending gabapentin 100mg  capsule in addition to her 300mg  capsule qhs prn pain.   Also clarified with her that celexa has been sent, to stop prozac, and how to take celexa.  She has scheduled follow-up with me 9/30 to discuss body pain and briefly touch base on antihypertensive change/BMET and depressive symptoms.  Pt verbalizes understanding.  Leona Singleton, MD

## 2013-07-16 ENCOUNTER — Other Ambulatory Visit: Payer: Self-pay | Admitting: Internal Medicine

## 2013-07-16 ENCOUNTER — Ambulatory Visit (INDEPENDENT_AMBULATORY_CARE_PROVIDER_SITE_OTHER): Payer: No Typology Code available for payment source | Admitting: Psychology

## 2013-07-16 DIAGNOSIS — F32A Depression, unspecified: Secondary | ICD-10-CM

## 2013-07-16 DIAGNOSIS — F329 Major depressive disorder, single episode, unspecified: Secondary | ICD-10-CM

## 2013-07-16 DIAGNOSIS — C50919 Malignant neoplasm of unspecified site of unspecified female breast: Secondary | ICD-10-CM

## 2013-07-16 MED ORDER — TAMOXIFEN CITRATE 20 MG PO TABS: 20.0000 mg | ORAL_TABLET | Freq: Every day | ORAL | Status: DC

## 2013-07-16 NOTE — Assessment & Plan Note (Signed)
Report of mood is euthymic.  Does not significant pain.  Based on her consistent report, pain is the bigger issue and while they are interrelated, she does not complain of poor mood, motivation, anhedonia outside of pain.  I think this is a change from before she started on Prozac.  Discussed change to Celexa.  She picked up the prescription.  She does not seem ambivalent about medication presently.  I gave her feedback about how I saw her mood (relatively stable).  She reported that her desire / need for therapy varies (sometimes she doesn't feel like she needs to come) but that she thinks it is very helpful to talk to me and would like to continue.  She wanted to schedule in two weeks.

## 2013-07-16 NOTE — Progress Notes (Signed)
Al presented for follow-up.  She has been seeing her aunt frequently as her aunt's health declines.  Hospice has been involved for about a year and a half.  Her aunt is very up front about dying and wants to talk about it.  Discussed Kameran's response to this and her emotional experience.  She reported a good three days with [name] over the weekend.  She says she is still in a lot of pain and was especially so on Sunday.  Had difficulty getting out of bed.  Reviewed Dr. Lucienne Minks note from 07/12/13 and talked specifically about the reason for the change to Celexa and Charmon's thoughts about starting Tamoxifen.

## 2013-07-17 ENCOUNTER — Telehealth: Payer: Self-pay

## 2013-07-17 NOTE — Telephone Encounter (Signed)
S/w pt that Dr Rosie Fate has escribed tamoxifen and she can pick it up. Asked her to call the pharmacy and call us back if it did not go through. She stated she would call them now.

## 2013-07-31 ENCOUNTER — Ambulatory Visit: Payer: No Typology Code available for payment source | Admitting: Family Medicine

## 2013-08-01 ENCOUNTER — Other Ambulatory Visit: Payer: Self-pay

## 2013-08-01 DIAGNOSIS — Z1231 Encounter for screening mammogram for malignant neoplasm of breast: Secondary | ICD-10-CM

## 2013-08-02 ENCOUNTER — Ambulatory Visit (INDEPENDENT_AMBULATORY_CARE_PROVIDER_SITE_OTHER): Payer: No Typology Code available for payment source | Admitting: Psychology

## 2013-08-02 DIAGNOSIS — F329 Major depressive disorder, single episode, unspecified: Secondary | ICD-10-CM

## 2013-08-02 DIAGNOSIS — F32A Depression, unspecified: Secondary | ICD-10-CM

## 2013-08-02 NOTE — Assessment & Plan Note (Signed)
Reports she takes Celexa nightly.  Ambivalence here seems resolved.  No difficulty tolerating it.  Mood is reported as better.  Function appears to be fairly good.  She cooks regularly, visits with her aunt and engages in other activities.    She described some significant assertiveness with regards to family dynamics.  From our initial meetings, she seems to be much clearer about her boundaries and is able to set them without guilt.  She asked to meet in a week.  I had a week and a half available.  Scheduled for:  10/13 at 10:00.

## 2013-08-02 NOTE — Progress Notes (Signed)
Victoria Delacruz presents for follow-up.  The focus of our meeting was on the challenging family dynamics she is managing - particularly around the declining health of a beloved aunt.  She reports her mood has been variable but overall, improved.  She cries less frequently.  I did not ask about pain today and she did not report on it.  Continues to try to exercise.  Discussed potential ways to motivate.  She is celebrating her 60 year anniversary from her first cancer (I think that is what it is) with a nice party this Saturday.  She will also sign up for the [employer].

## 2013-08-13 ENCOUNTER — Ambulatory Visit (INDEPENDENT_AMBULATORY_CARE_PROVIDER_SITE_OTHER): Payer: No Typology Code available for payment source | Admitting: Psychology

## 2013-08-13 DIAGNOSIS — F32A Depression, unspecified: Secondary | ICD-10-CM

## 2013-08-13 DIAGNOSIS — F329 Major depressive disorder, single episode, unspecified: Secondary | ICD-10-CM

## 2013-08-13 NOTE — Progress Notes (Signed)
Victoria Delacruz presents for follow-up.  She had a very successful party to celebrate her 10 years since her initial cancer diagnosis.  Her mother did not attend which was disheartening but she had many family and friends who did attend and she felt well loved.  She was exhausted that night and the next day.  She reports experiencing a bit more fatigue these days and pain.  She states she hasn't been talking about it as much because it isn't changing and she is tired of talking about it.  Headed to [place] with [name] for a wedding this weekend.  She has been thinking about her relationship with [name] and believes getting married is important.  She has discussed this with [name].

## 2013-08-13 NOTE — Patient Instructions (Signed)
Please schedule a follow-up:  October 21st at 3:00.   If you find you have a "seasonal" component to your mood and energy, light therapy might be helpful.  The one that has some good reviews on line is:  [facility].  It is about $70. I am glad your party was such a success.  Congrats on 10 years!

## 2013-08-13 NOTE — Assessment & Plan Note (Signed)
Report of mood is good.  Affect is consistent.  She looks happy.  Still, when prompted, notes consistent pain that is impairing and a resurgence of fatigue recently.  Functionally speaking, she appears to be doing well.  She planned a major party for herself that included a meal, is going to [place] for a wedding and continues to play a supportive role with her aunt who is terminally ill.    She wants to meet again as soon as possible.  Will seek to identify specific goals for therapy because with the exception of being a sounding board and helping validate her experience, we are not working toward anything.  Progress has been made in terms of identifying feelings and limits and behaving in a more assertive fashion.    See patient instructions for further plan.

## 2013-08-21 ENCOUNTER — Ambulatory Visit (INDEPENDENT_AMBULATORY_CARE_PROVIDER_SITE_OTHER): Payer: No Typology Code available for payment source | Admitting: Psychology

## 2013-08-21 DIAGNOSIS — F32A Depression, unspecified: Secondary | ICD-10-CM

## 2013-08-21 DIAGNOSIS — R109 Unspecified abdominal pain: Secondary | ICD-10-CM

## 2013-08-21 DIAGNOSIS — F329 Major depressive disorder, single episode, unspecified: Secondary | ICD-10-CM

## 2013-08-21 NOTE — Assessment & Plan Note (Signed)
Mood is not reported on.  Affect is within normal limits.  Energy seems good.  Victoria Delacruz thinks that she will need support moving forward as her aunt's health continues to decline.  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  Having an outlet for herself might prove useful.  Scheduled for Monday November 3rd at 2:00.

## 2013-08-21 NOTE — Progress Notes (Signed)
Victoria Delacruz presents for follow-up.  She had a fine time in [place] but was anxious to get back home - mostly because she missed her dog.  She has determined that traveling stresses her and she is over the need to take trips at least for a little while.  Discussed the weekend that included time with her significant other's parents.  This included some tense interactions between Victoria Delacruz's parents and some new insights on Victoria Delacruz's part.  Victoria Delacruz has been reading materials from hospice and noting her aunt's continued deterioration.  Discussed.

## 2013-08-21 NOTE — Assessment & Plan Note (Signed)
Victoria Delacruz is concerned that her pain and her lack of appetite might mean something is wrong with her.  Discussed options.  She is anxious to meet with her oncologist again.

## 2013-08-24 ENCOUNTER — Ambulatory Visit: Payer: No Typology Code available for payment source

## 2013-09-03 ENCOUNTER — Ambulatory Visit (INDEPENDENT_AMBULATORY_CARE_PROVIDER_SITE_OTHER): Payer: No Typology Code available for payment source | Admitting: Psychology

## 2013-09-03 DIAGNOSIS — F32A Depression, unspecified: Secondary | ICD-10-CM

## 2013-09-03 DIAGNOSIS — F329 Major depressive disorder, single episode, unspecified: Secondary | ICD-10-CM
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
## 2013-09-06 ENCOUNTER — Other Ambulatory Visit: Payer: Self-pay

## 2013-09-06 DIAGNOSIS — C50919 Malignant neoplasm of unspecified site of unspecified female breast: Secondary | ICD-10-CM

## 2013-09-06 DIAGNOSIS — D509 Iron deficiency anemia, unspecified: Secondary | ICD-10-CM

## 2013-09-07 ENCOUNTER — Ambulatory Visit: Payer: Self-pay

## 2013-09-07 ENCOUNTER — Telehealth: Payer: Self-pay | Admitting: Internal Medicine

## 2013-09-07 ENCOUNTER — Other Ambulatory Visit: Payer: Self-pay | Admitting: Lab

## 2013-09-11 ENCOUNTER — Telehealth: Payer: Self-pay | Admitting: Internal Medicine

## 2013-09-11 ENCOUNTER — Ambulatory Visit (HOSPITAL_BASED_OUTPATIENT_CLINIC_OR_DEPARTMENT_OTHER): Payer: No Typology Code available for payment source

## 2013-09-11 ENCOUNTER — Ambulatory Visit (HOSPITAL_BASED_OUTPATIENT_CLINIC_OR_DEPARTMENT_OTHER): Payer: No Typology Code available for payment source | Admitting: Internal Medicine

## 2013-09-11 ENCOUNTER — Other Ambulatory Visit (HOSPITAL_BASED_OUTPATIENT_CLINIC_OR_DEPARTMENT_OTHER): Payer: No Typology Code available for payment source | Admitting: Lab

## 2013-09-11 VITALS — BP 133/89 | HR 77 | Temp 98.1°F | Resp 18 | Ht 63.0 in | Wt 154.2 lb

## 2013-09-11 DIAGNOSIS — E876 Hypokalemia: Secondary | ICD-10-CM

## 2013-09-11 DIAGNOSIS — D509 Iron deficiency anemia, unspecified: Secondary | ICD-10-CM

## 2013-09-11 DIAGNOSIS — M797 Fibromyalgia: Secondary | ICD-10-CM

## 2013-09-11 DIAGNOSIS — C50919 Malignant neoplasm of unspecified site of unspecified female breast: Secondary | ICD-10-CM

## 2013-09-11 DIAGNOSIS — C50419 Malignant neoplasm of upper-outer quadrant of unspecified female breast: Secondary | ICD-10-CM

## 2013-09-11 DIAGNOSIS — Z452 Encounter for adjustment and management of vascular access device: Secondary | ICD-10-CM

## 2013-09-11 DIAGNOSIS — Z95828 Presence of other vascular implants and grafts: Secondary | ICD-10-CM

## 2013-09-11 LAB — COMPREHENSIVE METABOLIC PANEL (CC13)
ALT: 11 U/L (ref 0–55)
AST: 16 U/L (ref 5–34)
Albumin: 3.5 g/dL (ref 3.5–5.0)
Alkaline Phosphatase: 69 U/L (ref 40–150)
Anion Gap: 10 mEq/L (ref 3–11)
BUN: 10.9 mg/dL (ref 7.0–26.0)
CO2: 22 mEq/L (ref 22–29)
Calcium: 9 mg/dL (ref 8.4–10.4)
Chloride: 111 mEq/L — ABNORMAL HIGH (ref 98–109)
Creatinine: 0.7 mg/dL (ref 0.6–1.1)
Glucose: 118 mg/dl (ref 70–140)
Potassium: 3 mEq/L — CL (ref 3.5–5.1)
Sodium: 143 mEq/L (ref 136–145)
Total Bilirubin: 0.38 mg/dL (ref 0.20–1.20)
Total Protein: 7.5 g/dL (ref 6.4–8.3)

## 2013-09-11 LAB — CBC WITH DIFFERENTIAL/PLATELET
BASO%: 0.8 % (ref 0.0–2.0)
Basophils Absolute: 0 10*3/uL (ref 0.0–0.1)
EOS%: 1.3 % (ref 0.0–7.0)
Eosinophils Absolute: 0.1 10*3/uL (ref 0.0–0.5)
HCT: 36.1 % (ref 34.8–46.6)
HGB: 12 g/dL (ref 11.6–15.9)
LYMPH%: 29.2 % (ref 14.0–49.7)
MCH: 30.5 pg (ref 25.1–34.0)
MCHC: 33.4 g/dL (ref 31.5–36.0)
MCV: 91.5 fL (ref 79.5–101.0)
MONO#: 0.2 10*3/uL (ref 0.1–0.9)
MONO%: 4.1 % (ref 0.0–14.0)
NEUT#: 3.8 10*3/uL (ref 1.5–6.5)
NEUT%: 64.6 % (ref 38.4–76.8)
Platelets: 247 10*3/uL (ref 145–400)
RBC: 3.95 10*6/uL (ref 3.70–5.45)
RDW: 13.2 % (ref 11.2–14.5)
WBC: 5.9 10*3/uL (ref 3.9–10.3)
lymph#: 1.7 10*3/uL (ref 0.9–3.3)

## 2013-09-11 LAB — LACTATE DEHYDROGENASE (CC13): LDH: 201 U/L (ref 125–245)

## 2013-09-11 MED ORDER — HEPARIN SOD (PORK) LOCK FLUSH 100 UNIT/ML IV SOLN
500.0000 [IU] | Freq: Once | INTRAVENOUS | Status: AC
  Administered 2013-09-11: 500 [IU] via INTRAVENOUS
  Filled 2013-09-11: qty 5

## 2013-09-11 MED ORDER — SODIUM CHLORIDE 0.9 % IJ SOLN
10.0000 mL | INTRAMUSCULAR | Status: DC | PRN
  Administered 2013-09-11: 10 mL via INTRAVENOUS
  Filled 2013-09-11: qty 10

## 2013-09-11 MED ORDER — POTASSIUM CHLORIDE CRYS ER 20 MEQ PO TBCR: 20.0000 meq | EXTENDED_RELEASE_TABLET | Freq: Every day | ORAL | Status: DC

## 2013-09-11 NOTE — Patient Instructions (Signed)
Implanted Port Instructions  An implanted port is a central line that has a round shape and is placed under the skin. It is used for long-term IV (intravenous) access for:  · Medicine.  · Fluids.  · Liquid nutrition, such as TPN (total parenteral nutrition).  · Blood samples.  Ports can be placed:  · In the chest area just below the collarbone (this is the most common place.)  · In the arms.  · In the belly (abdomen) area.  · In the legs.  PARTS OF THE PORT  A port has 2 main parts:  · The reservoir. The reservoir is round, disc-shaped, and will be a small, raised area under your skin.  · The reservoir is the part where a needle is inserted (accessed) to either give medicines or to draw blood.  · The catheter. The catheter is a long, slender tube that extends from the reservoir. The catheter is placed into a large vein.  · Medicine that is inserted into the reservoir goes into the catheter and then into the vein.  INSERTION OF THE PORT  · The port is surgically placed in either an operating room or in a procedural area (interventional radiology).  · Medicine may be given to help you relax during the procedure.  · The skin where the port will be inserted is numbed (local anesthetic).  · 1 or 2 small cuts (incisions) will be made in the skin to insert the port.  · The port can be used after it has been inserted.  INCISION SITE CARE  · The incision site may have small adhesive strips on it. This helps keep the incision site closed. Sometimes, no adhesive strips are placed. Instead of adhesive strips, a special kind of surgical glue is used to keep the incision closed.  · If adhesive strips were placed on the incision sites, do not take them off. They will fall off on their own.  · The incision site may be sore for 1 to 2 days. Pain medicine can help.  · Do not get the incision site wet. Bathe or shower as directed by your caregiver.  · The incision site should heal in 5 to 7 days. A small scar may form after the  incision has healed.  ACCESSING THE PORT  Special steps must be taken to access the port:  · Before the port is accessed, a numbing cream can be placed on the skin. This helps numb the skin over the port site.  · A sterile technique is used to access the port.  · The port is accessed with a needle. Only "non-coring" port needles should be used to access the port. Once the port is accessed, a blood return should be checked. This helps ensure the port is in the vein and is not clogged (clotted).  · If your caregiver believes your port should remain accessed, a clear (transparent) bandage will be placed over the needle site. The bandage and needle will need to be changed every week or as directed by your caregiver.  · Keep the bandage covering the needle clean and dry. Do not get it wet. Follow your caregiver's instructions on how to take a shower or bath when the port is accessed.  · If your port does not need to stay accessed, no bandage is needed over the port.  FLUSHING THE PORT  Flushing the port keeps it from getting clogged. How often the port is flushed depends on:  · If a   constant infusion is running. If a constant infusion is running, the port may not need to be flushed.  · If intermittent medicines are given.  · If the port is not being used.  For intermittent medicines:  · The port will need to be flushed:  · After medicines have been given.  · After blood has been drawn.  · As part of routine maintenance.  · A port is normally flushed with:  · Normal saline.  · Heparin.  · Follow your caregiver's advice on how often, how much, and the type of flush to use on your port.  IMPORTANT PORT INFORMATION  · Tell your caregiver if you are allergic to heparin.  · After your port is placed, you will get a manufacturer's information card. The card has information about your port. Keep this card with you at all times.  · There are many types of ports available. Know what kind of port you have.  · In case of an  emergency, it may be helpful to wear a medical alert bracelet. This can help alert health care workers that you have a port.  · The port can stay in for as long as your caregiver believes it is necessary.  · When it is time for the port to come out, surgery will be done to remove it. The surgery will be similar to how the port was put in.  · If you are in the hospital or clinic:  · Your port will be taken care of and flushed by a nurse.  · If you are at home:  · A home health care nurse may give medicines and take care of the port.  · You or a family member can get special training and directions for giving medicine and taking care of the port at home.  SEEK IMMEDIATE MEDICAL CARE IF:   · Your port does not flush or you are unable to get a blood return.  · New drainage or pus is coming from the incision.  · A bad smell is coming from the incision site.  · You develop swelling or increased redness at the incision site.  · You develop increased swelling or pain at the port site.  · You develop swelling or pain in the surrounding skin near the port.  · You have an oral temperature above 102° F (38.9° C), not controlled by medicine.  MAKE SURE YOU:   · Understand these instructions.  · Will watch your condition.  · Will get help right away if you are not doing well or get worse.  Document Released: 10/18/2005 Document Revised: 01/10/2012 Document Reviewed: 01/09/2009  [employer]® Patient Information ©2014 [employer].

## 2013-09-11 NOTE — Patient Instructions (Signed)

## 2013-09-11 NOTE — Telephone Encounter (Signed)
worked 09/11/13 POF AVS and cal given to pt shh

## 2013-09-12 ENCOUNTER — Ambulatory Visit (INDEPENDENT_AMBULATORY_CARE_PROVIDER_SITE_OTHER): Payer: No Typology Code available for payment source | Admitting: Psychology

## 2013-09-12 DIAGNOSIS — F329 Major depressive disorder, single episode, unspecified: Secondary | ICD-10-CM

## 2013-09-12 DIAGNOSIS — F32A Depression, unspecified: Secondary | ICD-10-CM

## 2013-09-12 NOTE — Progress Notes (Signed)
Victoria Delacruz presents for follow-up.  She is really struggling with family relationships around the death of her uncle and her aunt's terminal illness.  She is filled with anger and hurt and it is affecting her sleep and likely other aspects of her health.  This was the focus of our visit.  Victoria Delacruz is doing reasonably well given his circumstances.

## 2013-09-12 NOTE — Assessment & Plan Note (Signed)
Does not appear depressed and is not reporting this mood.  Is angry and hurt.  Seems to have some energy behind it.  Not complaining of pain today.  Reexamined the issue of boundaries - how to get clear on them and secondly, how to communicate them.  See patient instructions for further plan.

## 2013-09-12 NOTE — Patient Instructions (Signed)
Please schedule a follow-up for:  November 18th at 3:00.  Call me at [phone]. As discussed - you will not likely be able to change the people that you want to change.  You can change your reaction to them.  Check out "the Four Questions" by Jules Husbands."  The link is:   [email or web address]

## 2013-09-12 NOTE — Progress Notes (Signed)
Hematology and Oncology Follow Up Visit  Victoria Delacruz [id number] Aug 30, 1971 42 y.o. 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   Prior Therapy: Following local excision of the tumor on 05/28/2010, she completed 4 cycles of adjuvant chemotherapy with cytoxan and taxotere in combination with neulasta from 06/08/2010 through 08/10/2010.  XRT was given to the left breast and chest wall from 09/08/2010 through 11/04/2010 consisting of 4780 in 26 fractions with an 1800 cGy boost in 9 fractions.    Current therapy: None  Interim History: Victoria Delacruz continues to do well, now over 3 years from the time of diagnosis of the invasive duct carcinoma.  She will think about removing her port and call us when she is ready. She was last seen by me on 07/06/2013. Since that visit, she report compliance to tamoxifen 20 mg daily.  Her prozac was changed to celexa to avoid any drug interaction.  Victoria Delacruz has been troubled with fibromyalgia involving her hands.  She continues to take Neurontin for this with some improvement. She has undergone evaluation of the right breast which included a ductogram that was negative.  Presently, she appears to be fairly well.  She still has some abdominal pain, but it is better with MiraLAX.  She is without any new complaints today.  Medications: I have reviewed the patient's current medications.  Current Outpatient Prescriptions  Medication Sig Dispense Refill  . amLODipine (NORVASC) 5 MG tablet Take 1 tablet (5 mg total) by mouth daily.  90 tablet  3  . citalopram (CELEXA) 20 MG tablet Stop prozac, go 1-2 days off prozac, then start celexa 1/2 tablet 3  days, then 1 tablet daily.  30 tablet  3  . gabapentin (NEURONTIN) 100 MG capsule Take 100mg  capsule in addition to your 300mg  capsule at night as needed for pain.  30 capsule  3  . gabapentin (NEURONTIN) 300 MG capsule Take 1 capsule (300 mg total) by mouth at bedtime. If needed, take 100mg  capsule in addition to this qhs for pain.  30 capsule  11  . oxyCODONE-acetaminophen (PERCOCET/ROXICET) 5-325 MG per tablet May take 1-2 tablets PO every 4 to 6 hours for severe pain - Do not take with Tylenol as this tablet already contains tylenol  15 tablet  0  . polyethylene glycol powder (GLYCOLAX/MIRALAX) powder Take 17 g by mouth 2 (two) times daily as needed.  3350 g  1  . potassium chloride SA (K-DUR,KLOR-CON) 20 MEQ tablet Take 1 tablet (20 mEq total) by mouth daily.  7 tablet  0  . propranolol (INDERAL) 40 MG tablet TAKE ONE-HALF TABLET BY MOUTH TWICE DAILY  30 tablet  11  . SUMAtriptan (IMITREX) 25 MG tablet One tab my mouth at the onset of headache. May take an additional dose two hours later. Do not exceed two doses in a 7 day period.  10 tablet  0  . tamoxifen (NOLVADEX) 20 MG tablet Take 1 tablet (20 mg total) by mouth daily.  30 tablet  11   No current facility-administered medications for this visit.     Allergies: No Known Allergies  PROBLEM LIST:  1. High-grade ductal carcinoma in situ involving the left breast status post left mastectomy  with reconstruction in October 2004. Tumor was 4.0 cm. Sentinel lymph nodes were negative. Estrogen  receptor was 24%, progesterone receptor 2%. The patient underwent a saline implant.  2. Invasive duct carcinoma involving the residual left breast tissue in July 2011. The patient had a 6.0 mm subdermal recurrence in the upper outer quadrant of the left breast at about the 1-2 o'clock  position. She underwent local excision of the tumor on 05/28/2010 followed by 4 cycles of adjuvant chemotherapy with Cytoxan and Taxotere in combination with Neulasta from  06/08/2010 through  08/10/2010. Radiation treatments to the left breast and chest wall were given from 09/08/2010 through 11/04/2010 consisting of 4780 cGy in 26 fractions with an 1800 cGy boost in 9 fractions. She  took tamoxifen sporadically for awhile, then started having vaginal bleeding and tamoxifen was ultimately discontinued in June of 2012.  3. Laparoscopic assisted vaginal hysterectomy 08/30/2011.  4. History of iron-deficiency anemia secondary to heavy periods and possibly fibroids treated with intravenous iron in the past.  5. Irritable bowel syndrome.  6. History of hypertension.  7. Family history of breast cancer, but negative genetic testing.  8. Right-sided Port-A-Cath placed on 05/28/2010, currently being maintained with heparin flushes every 2 months.  9. Fibromyalgia diagnosed in the fall 2011.  10. Migraine headaches which started around 2000. 11. Hemangiomas involving the liver first noted on a PET scan from 04/30/2010.  The CT scan of the abdomen and pelvis with IV contrast carried out on 12/17/2012 showed a 2.5 cm lesion in the superior right hepatic lobe at the hepatic dome and a 7 mm lesion seen on image 15.  Additional Past Medical History, Surgical history, Social history, and Family History were reviewed and updated.  Review of Systems: Constitutional:  Negative for fever, chills, night sweats, anorexia, weight loss, pain. Cardiovascular: no chest pain or dyspnea on exertion Respiratory: no cough, shortness of breath, or wheezing Neurological: no TIA or stroke symptoms Dermatological: negative for rash ENT: negative for - epistaxis Skin: Negative. Gastrointestinal: no abdominal pain, change in bowel habits, or black or bloody stools Genito-Urinary: no dysuria, trouble voiding, or hematuria Hematological and Lymphatic: negative for - bleeding problems Breast: negative for - new or changing breast lumps, nipple changes or nipple discharge Musculoskeletal:  negative for - gait disturbance Remaining ROS negative. Physical Exam: Blood pressure 133/89, pulse 77, temperature 98.1 F (36.7 C), temperature source Oral, resp. rate 18, height 5\' 3"  (1.6 m), weight 154 lb 3.2 oz (69.945 kg), last menstrual period 04/23/2011. ECOG: 0 General appearance: alert, cooperative, appears stated age and no distress Head: Normocephalic, without obvious abnormality, atraumatic Neck: no adenopathy, supple, symmetrical, trachea midline and thyroid not enlarged, symmetric, no tenderness/mass/nodules Lymph nodes: Cervical, supraclavicular, and axillary nodes normal. Heart:regular rate and rhythm, S1, S2 normal, no murmur, click, rub or gallop Lung:chest clear, no wheezing, rales, normal symmetric air entry, Heart exam - S1, S2 normal, no murmur, no gallop, rate regular Breast exam: R breast is benign w nipple-areolar complex, scars well healed.  No lumps or nipple retraction. L breast is s/p mastectomy with a saline implant smaller than the right breast. No lumps or masses appreciated.  Abdomin: soft, non-tender, without masses or organomegaly EXT:No peripheral edema Neuro: No focal deficits.   Lab Results: Lab Results  Component Value Date   WBC 5.9 09/11/2013   HGB 12.0 09/11/2013   HCT 36.1 09/11/2013   MCV 91.5 09/11/2013   PLT 247 09/11/2013     Chemistry  Component Value Date/Time   NA 143 09/11/2013 1020   NA 140 12/17/2012 1312   K 3.0* 09/11/2013 1020   K 3.7 12/17/2012 1312   CL 109* 02/20/2013 1044   CL 107 12/17/2012 1312   CO2 22 09/11/2013 1020   CO2 25 12/17/2012 1312   BUN 10.9 09/11/2013 1020   BUN 9 12/17/2012 1312   CREATININE 0.7 09/11/2013 1020   CREATININE 0.66 12/17/2012 1312   CREATININE 0.81 08/22/2012 1125      Component Value Date/Time   CALCIUM 9.0 09/11/2013 1020   CALCIUM 9.0 12/17/2012 1312   ALKPHOS 69 09/11/2013 1020   ALKPHOS 77 12/17/2012 1312   AST 16 09/11/2013 1020   AST 20 12/17/2012 1312   ALT 11 09/11/2013  1020   ALT 10 12/17/2012 1312   BILITOT 0.38 09/11/2013 1020   BILITOT 0.2* 12/17/2012 1312     Radiological Studies:  1. Digital screening mammogram of the right breast was negative on 05/21/2011.  2. Chest x-ray, 2 view, from 06/10/2011 was negative.  3. Digital diagnostic left mammogram on 11/08/2011 showed no evidence of left breast recurrence. It was noted that the patient is due for right screening mammogram in July 2013. Ultrasound of the left breast showed normal skin and fat. No discrete mass was seen.  4. MRI of the head with and without IV contrast from 04/10/2012 showed a normal exam. No cause of the patient's headache was identified.  5. Chest x-ray 2 view on 07/18/2012 showed no radiographic evidence of acute cardiopulmonary process.Right subclavian Port-A-Cath tip projects over the proximal SVC.  6. Digital diagnostic unilateral right mammogram on 08/23/2012 showed no mammographic or sonographic evidence of malignancy, right breast. A clear nipple discharge from a single duct orifice was expressed at physical examination.  7. US abdomen complete on 08/24/2012 showed no acute finding. Negative for gallstones. Two echogenic hepatic lesions are likely hemangiomas. The larger is seen on a prior study. As the stability of the second lesion cannot be established, repeat ultrasound in 4-6 months is recommended.  8. Ductogram unilateral right on 08/23/2012 showed a normal right breast ductography. Since no filling defect was identified within the ductal system from which the nipple discharge was expressed, in the absence of a mammographic or sonographic finding, and given the fact that the initial nipple discharge was from multiple ducts, the discharge is likely benign. Should the discharge recur and especially if it is bloody and arising from a single duct, repeat ductography may be beneficial at that time.  9. NM HIDA scan on 09/07/2012 showed the gallbladder began to visualize at 43 minutes.  This indicates patency of the cystic duct. Delayed visualization may be associated with element of chronic cholecystitis. During CCK infusion the gallbladder contracted 75.1%. 30% or greater is normal range. During CCK infusion the patient reported experiencing abdominal discomfort.  10. Diagnostic abdominal x-ray 1 view on 11/03/2012 showed moderate colonic stool correlating with the provided history of constipation. Otherwise nonspecific abdomen with no signs of obstruction.  11. CT scan of abdomen and pelvis with IV contrast on 12/17/2012 showed no acute findings in the abdomen or pelvis.  There were felt to be hepatic hemangiomas present, 1 measuring 2.5 cm in the superior right hepatic lobe at the hepatic dome and a 7 mm lesion located anteriorly seen on image 15.  These lesions were present on the PET scan carried out on 04/30/2010 and are unchanged.  There was a small amount of free fluid in the  pelvis, presumably physiologic.  Impression and Plan: 1. Left breast invasive duct carcinoma (ER+,PR+,HER2 negative) as detailed above. - She is now over 3 years from the time of diagnosis of the invasive duct carcinoma.  We discussed the importance of continuing tamoxifen to reduce her chances of local reoccurrence. She is experiencing hot flashes.  I discussed other common side-effects of tamoxifen included but not limited to hot flashes, n/v, vaginal discharge, bone pain, lightheadedness, dizziness, fatigue, peripheral edema, anorexia. We will follow her closely for the symptoms of side-effects for consideration of adjunctive medications for her hot flashes.   2. Hypokalemia, unclear etiology.  She was on HCTZ prior but this has been discontinued.  She was given a supply of 140 mEQ KCL to take over the next  7 days.  She advised on symptoms of hypokalemia includes muscle twitching,palpitations.  If she notices increasing symptoms, she should present to the nearest emergency room for further evaluation. She  understood this plan. She will repeat her BMP in 7 days.    3. Digital diagnostic unilateral right mammogram on 08/23/2012 showed no mammographic or sonographic evidence of malignancy, right breast. Next mammogram should be next month.  Obtain a cxr today since her last cxr was 07/18/2012.   4. RTC  in 3-4 months, at which time we will check CBC and chemistries and monitor for worsening hot flashes secondary to her tamoxifen.     All questions were answered.  The patient knows to call the clinic with any problems, questions or concerns.  We can certainly see the patient much sooner if necessary.   I spent 15 minutes counseling the patient face to face.  The total time spent in the appointment was 25 minutes.  Viyaan Champine, MD 11/12/20143:58 AM

## 2013-09-13 ENCOUNTER — Telehealth: Payer: Self-pay | Admitting: *Deleted

## 2013-09-13 NOTE — Telephone Encounter (Signed)
Called pt and gave her the code for the [facility] and informed her that this was for her and her only.

## 2013-09-18 ENCOUNTER — Other Ambulatory Visit (HOSPITAL_BASED_OUTPATIENT_CLINIC_OR_DEPARTMENT_OTHER): Payer: No Typology Code available for payment source | Admitting: Lab

## 2013-09-18 ENCOUNTER — Ambulatory Visit: Payer: No Typology Code available for payment source | Admitting: Psychology

## 2013-09-18 ENCOUNTER — Telehealth: Payer: Self-pay | Admitting: Internal Medicine

## 2013-09-18 DIAGNOSIS — E876 Hypokalemia: Secondary | ICD-10-CM

## 2013-09-18 LAB — BASIC METABOLIC PANEL (CC13)
Anion Gap: 6 mEq/L (ref 3–11)
BUN: 7.7 mg/dL (ref 7.0–26.0)
CO2: 27 mEq/L (ref 22–29)
Calcium: 9.2 mg/dL (ref 8.4–10.4)
Chloride: 108 mEq/L (ref 98–109)
Creatinine: 0.9 mg/dL (ref 0.6–1.1)
Glucose: 97 mg/dl (ref 70–140)
Potassium: 3.8 mEq/L (ref 3.5–5.1)
Sodium: 141 mEq/L (ref 136–145)

## 2013-09-18 NOTE — Telephone Encounter (Signed)
Called to make patient aware that her potassium was 3. 8 up from 3.0.

## 2013-09-20 ENCOUNTER — Ambulatory Visit (INDEPENDENT_AMBULATORY_CARE_PROVIDER_SITE_OTHER): Payer: No Typology Code available for payment source | Admitting: Psychology

## 2013-09-20 DIAGNOSIS — F329 Major depressive disorder, single episode, unspecified: Secondary | ICD-10-CM

## 2013-09-20 DIAGNOSIS — F32A Depression, unspecified: Secondary | ICD-10-CM

## 2013-09-20 NOTE — Assessment & Plan Note (Signed)
Reports that mood is variable - up and down.  Based on our conversation, it appears that she might have some normal variation in mood related to some significant psychosocial stressors and that her function is more closely tied to somatic issues (although clearly there is an interaction here).  Would not recommend any changes in medication at present.  Naiyana will be walking in the [facility] this Saturday.    Follow-up on December 4th at 10:30 or as needed.

## 2013-09-20 NOTE — Progress Notes (Signed)
Gwenda presents for follow-up.  She continues to feel stress related to several psychosocial situation including [name]'s convalescence and her wife's terminal illness.  She has a big Thanksgiving meal planned at her house with many family members scheduled to attend including her aunt.    Her aunt has been moved in to her aunt's son's home.  Maeci thinks this might be a good thing for her aunt but the relationships with the cousin and his wife remain hard for [name].  Discussed.

## 2013-10-03 ENCOUNTER — Ambulatory Visit
Admission: RE | Admit: 2013-10-03 | Discharge: 2013-10-03 | Disposition: A | Discharge status: Home / Self Care | Payer: No Typology Code available for payment source | Source: Ambulatory Visit

## 2013-10-03 DIAGNOSIS — Z1231 Encounter for screening mammogram for malignant neoplasm of breast: Secondary | ICD-10-CM

## 2013-10-04 ENCOUNTER — Ambulatory Visit (INDEPENDENT_AMBULATORY_CARE_PROVIDER_SITE_OTHER): Payer: No Typology Code available for payment source | Admitting: Psychology

## 2013-10-04 DIAGNOSIS — F32A Depression, unspecified: Secondary | ICD-10-CM

## 2013-10-04 DIAGNOSIS — M797 Fibromyalgia: Secondary | ICD-10-CM

## 2013-10-04 DIAGNOSIS — F329 Major depressive disorder, single episode, unspecified: Secondary | ICD-10-CM

## 2013-10-04 DIAGNOSIS — IMO0001 Reserved for inherently not codable concepts without codable children: Secondary | ICD-10-CM

## 2013-10-04 NOTE — Progress Notes (Signed)
Victoria Delacruz presents for follow-up.  [facility] was good.  She had three friends with her.  Finished in longer time than she wanted but it was good to be there.  Increased pain the last week.  High stress from situation with aunt.  Very frustrated with her cousin (aunt's son) and his wife.    [name] may be out of work until January.  Thanksgiving was good.

## 2013-10-04 NOTE — Assessment & Plan Note (Signed)
Mood seems a bit worse secondary to pain and increased stress (likely).  Themes of powerlessness are present in the situation with her aunt and her own health issues.  Attempted to create some power around the situation with her aunt (there isn't a lot of room to do this) but the emotions are too hot right now.  She does seem to benefit from feeling heard and validated.  Scheduled for 12/16 at 3:30.

## 2013-10-04 NOTE — Assessment & Plan Note (Signed)
I think stress surrounding [name]'s recovery and her aunt are taking a toll.  She is very sad and angry about her aunt's situation.  This may be increasing pain perceptions.

## 2013-10-16 ENCOUNTER — Ambulatory Visit (INDEPENDENT_AMBULATORY_CARE_PROVIDER_SITE_OTHER): Payer: No Typology Code available for payment source | Admitting: Psychology

## 2013-10-16 DIAGNOSIS — F32A Depression, unspecified: Secondary | ICD-10-CM

## 2013-10-16 DIAGNOSIS — F329 Major depressive disorder, single episode, unspecified: Secondary | ICD-10-CM

## 2013-10-16 NOTE — Assessment & Plan Note (Signed)
Report of mood is variable with ups and downs.  Ups include periods of energy where she feels like doing something like washing the dog.  Periods last at the most two days.  Down periods she has difficulty getting out of bed but if she has somewhere to be, she is able to motivate.  Affect today is within normal limits.  She is not tearful and she laughs appropriately.  She appears less irritable as well (e.g. Around the situation with her aunt).  Function remains variable but it sounds like she is enjoying a decent quality of life based on her report.  She asked to follow 12/30 at 10:30.

## 2013-10-16 NOTE — Progress Notes (Signed)
Victoria Delacruz presents for follow-up.  She is struggling to get in the holiday spirit but has started to get some things together.  The holidays center around giving for her and since she has not been working, it is harder to get in the spirit.  Discussed other ways of giving.  Reviewed situation with [name] and also with her aunt and her family.  Things are stable.

## 2013-10-30 ENCOUNTER — Ambulatory Visit (INDEPENDENT_AMBULATORY_CARE_PROVIDER_SITE_OTHER): Payer: No Typology Code available for payment source | Admitting: Psychology

## 2013-10-30 DIAGNOSIS — F32A Depression, unspecified: Secondary | ICD-10-CM

## 2013-10-30 DIAGNOSIS — F329 Major depressive disorder, single episode, unspecified: Secondary | ICD-10-CM

## 2013-10-30 NOTE — Progress Notes (Signed)
Victoria Delacruz presents for follow-up.  She had a rough holiday secondary to continued family issues surrounding the care of her aunt.  Discussed.

## 2013-10-30 NOTE — Assessment & Plan Note (Signed)
Report of mood is angry - specifically around this issue but it is disturbing enough to effect her beyond just thinking about it.  Her energy is good.  She has a plan to visit her aunt.  Provided support and validation.  Will follow on January 12th at 2:00 or sooner as needed.

## 2013-11-06 ENCOUNTER — Ambulatory Visit (HOSPITAL_BASED_OUTPATIENT_CLINIC_OR_DEPARTMENT_OTHER): Payer: No Typology Code available for payment source

## 2013-11-06 VITALS — BP 139/92 | HR 72

## 2013-11-06 DIAGNOSIS — C50419 Malignant neoplasm of upper-outer quadrant of unspecified female breast: Secondary | ICD-10-CM

## 2013-11-06 DIAGNOSIS — Z452 Encounter for adjustment and management of vascular access device: Secondary | ICD-10-CM

## 2013-11-06 DIAGNOSIS — Z95828 Presence of other vascular implants and grafts: Secondary | ICD-10-CM

## 2013-11-06 MED ORDER — HEPARIN SOD (PORK) LOCK FLUSH 100 UNIT/ML IV SOLN
500.0000 [IU] | Freq: Once | INTRAVENOUS | Status: AC
  Administered 2013-11-06: 500 [IU] via INTRAVENOUS
  Filled 2013-11-06: qty 5

## 2013-11-06 MED ORDER — SODIUM CHLORIDE 0.9 % IJ SOLN
10.0000 mL | INTRAMUSCULAR | Status: DC | PRN
  Administered 2013-11-06: 10 mL via INTRAVENOUS
  Filled 2013-11-06: qty 10

## 2013-11-06 NOTE — Patient Instructions (Signed)
Implanted Port Instructions  An implanted port is a central line that has a round shape and is placed under the skin. It is used for long-term IV (intravenous) access for:  · Medicine.  · Fluids.  · Liquid nutrition, such as TPN (total parenteral nutrition).  · Blood samples.  Ports can be placed:  · In the chest area just below the collarbone (this is the most common place.)  · In the arms.  · In the belly (abdomen) area.  · In the legs.  PARTS OF THE PORT  A port has 2 main parts:  · The reservoir. The reservoir is round, disc-shaped, and will be a small, raised area under your skin.  · The reservoir is the part where a needle is inserted (accessed) to either give medicines or to draw blood.  · The catheter. The catheter is a long, slender tube that extends from the reservoir. The catheter is placed into a large vein.  · Medicine that is inserted into the reservoir goes into the catheter and then into the vein.  INSERTION OF THE PORT  · The port is surgically placed in either an operating room or in a procedural area (interventional radiology).  · Medicine may be given to help you relax during the procedure.  · The skin where the port will be inserted is numbed (local anesthetic).  · 1 or 2 small cuts (incisions) will be made in the skin to insert the port.  · The port can be used after it has been inserted.  INCISION SITE CARE  · The incision site may have small adhesive strips on it. This helps keep the incision site closed. Sometimes, no adhesive strips are placed. Instead of adhesive strips, a special kind of surgical glue is used to keep the incision closed.  · If adhesive strips were placed on the incision sites, do not take them off. They will fall off on their own.  · The incision site may be sore for 1 to 2 days. Pain medicine can help.  · Do not get the incision site wet. Bathe or shower as directed by your caregiver.  · The incision site should heal in 5 to 7 days. A small scar may form after the  incision has healed.  ACCESSING THE PORT  Special steps must be taken to access the port:  · Before the port is accessed, a numbing cream can be placed on the skin. This helps numb the skin over the port site.  · A sterile technique is used to access the port.  · The port is accessed with a needle. Only "non-coring" port needles should be used to access the port. Once the port is accessed, a blood return should be checked. This helps ensure the port is in the vein and is not clogged (clotted).  · If your caregiver believes your port should remain accessed, a clear (transparent) bandage will be placed over the needle site. The bandage and needle will need to be changed every week or as directed by your caregiver.  · Keep the bandage covering the needle clean and dry. Do not get it wet. Follow your caregiver's instructions on how to take a shower or bath when the port is accessed.  · If your port does not need to stay accessed, no bandage is needed over the port.  FLUSHING THE PORT  Flushing the port keeps it from getting clogged. How often the port is flushed depends on:  · If a   constant infusion is running. If a constant infusion is running, the port may not need to be flushed.  · If intermittent medicines are given.  · If the port is not being used.  For intermittent medicines:  · The port will need to be flushed:  · After medicines have been given.  · After blood has been drawn.  · As part of routine maintenance.  · A port is normally flushed with:  · Normal saline.  · Heparin.  · Follow your caregiver's advice on how often, how much, and the type of flush to use on your port.  IMPORTANT PORT INFORMATION  · Tell your caregiver if you are allergic to heparin.  · After your port is placed, you will get a manufacturer's information card. The card has information about your port. Keep this card with you at all times.  · There are many types of ports available. Know what kind of port you have.  · In case of an  emergency, it may be helpful to wear a medical alert bracelet. This can help alert health care workers that you have a port.  · The port can stay in for as long as your caregiver believes it is necessary.  · When it is time for the port to come out, surgery will be done to remove it. The surgery will be similar to how the port was put in.  · If you are in the hospital or clinic:  · Your port will be taken care of and flushed by a nurse.  · If you are at home:  · A home health care nurse may give medicines and take care of the port.  · You or a family member can get special training and directions for giving medicine and taking care of the port at home.  SEEK IMMEDIATE MEDICAL CARE IF:   · Your port does not flush or you are unable to get a blood return.  · New drainage or pus is coming from the incision.  · A bad smell is coming from the incision site.  · You develop swelling or increased redness at the incision site.  · You develop increased swelling or pain at the port site.  · You develop swelling or pain in the surrounding skin near the port.  · You have an oral temperature above 102° F (38.9° C), not controlled by medicine.  MAKE SURE YOU:   · Understand these instructions.  · Will watch your condition.  · Will get help right away if you are not doing well or get worse.  Document Released: 10/18/2005 Document Revised: 01/10/2012 Document Reviewed: 01/09/2009  [employer]® Patient Information ©2014 [employer].

## 2013-11-12 ENCOUNTER — Ambulatory Visit (INDEPENDENT_AMBULATORY_CARE_PROVIDER_SITE_OTHER): Payer: No Typology Code available for payment source | Admitting: Psychology

## 2013-11-12 DIAGNOSIS — F3289 Other specified depressive episodes: Secondary | ICD-10-CM

## 2013-11-12 DIAGNOSIS — F32A Depression, unspecified: Secondary | ICD-10-CM

## 2013-11-12 DIAGNOSIS — F329 Major depressive disorder, single episode, unspecified: Secondary | ICD-10-CM

## 2013-11-12 NOTE — Progress Notes (Signed)
Victoria Delacruz presents for follow-up.  There have been some shifts in her aunt's situation.  She is temporarily living in an assisted living facility but will likely return to her son's house tomorrow.  Ambrea plans on continuing to interact with her despite the stress it causes her.  Alvester Chou is still not back at work and is having a hard time.  He met with his PCP and disclosed some of his psychological difficulties.  Was referred to what sounds like a psychiatrist. Discussed.

## 2013-11-12 NOTE — Assessment & Plan Note (Signed)
Does not appear depressed.  Chronic stress is still part of the picture.  Seems to be functional in terms of spending time with her aunt and helping to take care of Alvester Chou.  Discussed situation with Alvester Chou and provided a resource.  Asked her to call me as needed.  Will follow next week.

## 2013-11-20 ENCOUNTER — Ambulatory Visit (INDEPENDENT_AMBULATORY_CARE_PROVIDER_SITE_OTHER): Payer: Self-pay | Admitting: Psychology

## 2013-11-20 DIAGNOSIS — F32A Depression, unspecified: Secondary | ICD-10-CM

## 2013-11-20 DIAGNOSIS — F329 Major depressive disorder, single episode, unspecified: Secondary | ICD-10-CM

## 2013-11-20 DIAGNOSIS — F3289 Other specified depressive episodes: Secondary | ICD-10-CM

## 2013-11-20 NOTE — Assessment & Plan Note (Signed)
Report of mood is "stressed."  Does not appear depressed.  Affect is within normal limits.  Thoughts are clear and goal directed.  Reasonable amount of energy.  When asked about finding part-time work so that she can have her "own money" - something she has said she wants from the beginning, she reports back that she wants to work full-time.  Discussed briefly and agreed to address next visit.  The belief that she must work full-time seems to keep her quite stuck.  Consider using four questions to look more closely at this belief.  Scheduled for two weeks.

## 2013-11-20 NOTE — Progress Notes (Signed)
Victoria Delacruz presents for follow-up.  She reports her pain has increased again but she did not report a decrease in function.  She updated me on her aunt.  She continues to struggle with the emotions (anger mostly) that accompany this situation.  She spoke with an Aunt on her sister-in-laws side who provided a less reactive perspective.  Ultimately, Victoria Delacruz would like to get there but she is not there yet.    Victoria Delacruz is scheduled to return to work the 2nd.  Victoria Delacruz thinks this is a good thing for him.  She would like to go back to work full-time but everyone (friends / family) tells her she is too ill, especially with regards to pain.

## 2013-11-21 ENCOUNTER — Ambulatory Visit: Payer: No Typology Code available for payment source

## 2013-12-04 ENCOUNTER — Ambulatory Visit (INDEPENDENT_AMBULATORY_CARE_PROVIDER_SITE_OTHER): Payer: No Typology Code available for payment source | Admitting: Psychology

## 2013-12-04 ENCOUNTER — Encounter: Payer: Self-pay | Admitting: Psychology

## 2013-12-04 DIAGNOSIS — F3289 Other specified depressive episodes: Secondary | ICD-10-CM

## 2013-12-04 DIAGNOSIS — F32A Depression, unspecified: Secondary | ICD-10-CM

## 2013-12-04 DIAGNOSIS — F329 Major depressive disorder, single episode, unspecified: Secondary | ICD-10-CM

## 2013-12-04 NOTE — Progress Notes (Signed)
Victoria Delacruz presents for follow-up.  I wanted to talk about her plans moving forward and explore what I think is a great deal of ambivalence with regards to work outside of the home.  The situation with her aunt, however, has continued to bubble and that was the focus of our visit.  There is a chance her aunt will move out of her son's house in the coming weeks.  This has the potential to decrease Victoria Delacruz's level of stress - something she ties to her pain perceptions.    Victoria Delacruz is excited about her upcoming birthday and about her husband's return to work yesterday.

## 2013-12-04 NOTE — Assessment & Plan Note (Signed)
Report of mood is stressed in response to a specific stressor.  No significant signs or report of depression however.  She reports her pain to be bad the last several days.  She has decent energy with a clear and direct thought process.  No signs of difficulty with attention or concentration.  No evidence of SI / HI.  Overall, function appears to be good.    Would likely call this adjustment disorder if she did not have the history of depression.  Will attempt to get a better sense of treatment goals next visit.

## 2013-12-12 ENCOUNTER — Other Ambulatory Visit: Payer: Self-pay

## 2013-12-12 ENCOUNTER — Ambulatory Visit: Payer: Self-pay

## 2013-12-13 ENCOUNTER — Telehealth: Payer: Self-pay | Admitting: Internal Medicine

## 2013-12-13 NOTE — Telephone Encounter (Signed)
pt called and needed to r/s missed appt....done....pt ok and aware

## 2013-12-16 ENCOUNTER — Encounter (HOSPITAL_COMMUNITY): Payer: Self-pay | Admitting: Emergency Medicine

## 2013-12-16 ENCOUNTER — Emergency Department (HOSPITAL_COMMUNITY)
Admission: EM | Admit: 2013-12-16 | Discharge: 2013-12-16 | Disposition: A | Discharge status: Home / Self Care | Payer: No Typology Code available for payment source | Attending: Emergency Medicine | Admitting: Emergency Medicine

## 2013-12-16 DIAGNOSIS — R51 Headache: Secondary | ICD-10-CM | POA: Insufficient documentation

## 2013-12-16 DIAGNOSIS — F3289 Other specified depressive episodes: Secondary | ICD-10-CM | POA: Insufficient documentation

## 2013-12-16 DIAGNOSIS — R519 Headache, unspecified: Secondary | ICD-10-CM

## 2013-12-16 DIAGNOSIS — M62838 Other muscle spasm: Secondary | ICD-10-CM | POA: Insufficient documentation

## 2013-12-16 DIAGNOSIS — Z853 Personal history of malignant neoplasm of breast: Secondary | ICD-10-CM | POA: Insufficient documentation

## 2013-12-16 DIAGNOSIS — Z862 Personal history of diseases of the blood and blood-forming organs and certain disorders involving the immune mechanism: Secondary | ICD-10-CM | POA: Insufficient documentation

## 2013-12-16 DIAGNOSIS — M129 Arthropathy, unspecified: Secondary | ICD-10-CM | POA: Insufficient documentation

## 2013-12-16 DIAGNOSIS — F329 Major depressive disorder, single episode, unspecified: Secondary | ICD-10-CM | POA: Insufficient documentation

## 2013-12-16 DIAGNOSIS — Z79899 Other long term (current) drug therapy: Secondary | ICD-10-CM | POA: Insufficient documentation

## 2013-12-16 DIAGNOSIS — Z8742 Personal history of other diseases of the female genital tract: Secondary | ICD-10-CM | POA: Insufficient documentation

## 2013-12-16 DIAGNOSIS — I10 Essential (primary) hypertension: Secondary | ICD-10-CM | POA: Insufficient documentation

## 2013-12-16 MED ORDER — OXYCODONE-ACETAMINOPHEN 5-325 MG PO TABS: 1.0000 | ORAL_TABLET | Freq: Four times a day (QID) | ORAL | Status: DC | PRN

## 2013-12-16 MED ORDER — FENTANYL CITRATE 0.05 MG/ML IJ SOLN
100.0000 ug | Freq: Once | INTRAMUSCULAR | Status: AC
  Administered 2013-12-16: 100 ug via INTRAMUSCULAR

## 2013-12-16 MED ORDER — FENTANYL CITRATE 0.05 MG/ML IJ SOLN
100.0000 ug | Freq: Once | INTRAMUSCULAR | Status: DC
  Filled 2013-12-16: qty 2

## 2013-12-16 MED ORDER — DIAZEPAM 5 MG PO TABS
5.0000 mg | ORAL_TABLET | Freq: Four times a day (QID) | ORAL | Status: DC | PRN
Start: 2013-12-16 — End: 2014-04-16

## 2013-12-16 MED ORDER — KETOROLAC TROMETHAMINE 60 MG/2ML IM SOLN
60.0000 mg | Freq: Once | INTRAMUSCULAR | Status: AC
  Administered 2013-12-16: 60 mg via INTRAMUSCULAR
  Filled 2013-12-16: qty 2

## 2013-12-16 MED ORDER — IBUPROFEN 600 MG PO TABS
600.0000 mg | ORAL_TABLET | Freq: Four times a day (QID) | ORAL | Status: DC | PRN
Start: 2013-12-16 — End: 2014-04-16

## 2013-12-16 MED ORDER — DIAZEPAM 5 MG PO TABS
5.0000 mg | ORAL_TABLET | Freq: Once | ORAL | Status: AC
  Administered 2013-12-16: 5 mg via ORAL
  Filled 2013-12-16: qty 1

## 2013-12-16 NOTE — ED Provider Notes (Signed)
CSN: [id number]     Arrival date & time 12/16/13  1053 History   First MD Initiated Contact with Patient 12/16/13 1122     Chief Complaint  Patient presents with  . Headache     (Consider location/radiation/quality/duration/timing/severity/associated sxs/prior Treatment) Patient is a 43 y.o. female presenting with headaches. The history is provided by the patient.  Headache Associated symptoms: neck pain   Associated symptoms: no abdominal pain, no back pain, no diarrhea, no pain, no nausea, no neck stiffness, no numbness and no vomiting    patient has had pain in her right neck going to the right side of her head for the last 5 days. Unrelieved with medications at home. Is worse with movement or palpation. There's been no trauma. No numbness or weakness. No confusion. No visual changes. The headache is different than her typical migraines. She has a history of fibromyalgia.   Past Medical History  Diagnosis Date  . ECTOPIC PREGNANCY 1997 and 2011    x 2  . Hypertension   . Fibromyalgia   . Fibroid   . Breast cancer 2004 and 2011  . Anemia   . Arthritis   . Dizziness   . Family history of breast cancer     grandmother  . Blood transfusion 05/17/11, 05/25/11    anemia  . Depression    Past Surgical History  Procedure Laterality Date  . Laparoscopy for ectopic pregnancy    . Laparoscopy w/ mini-laparotomy    . Reconstruction breast immediate / delayed w/ tissue expander  2004  . Mastectomy  2004    complete with reconstruction x 3, no b/p punctures to left arm  . Laparoscopic assisted vaginal hysterectomy  08/30/2011    Procedure: LAPAROSCOPIC ASSISTED VAGINAL HYSTERECTOMY;  Surgeon: Osborne Oman, MD;  Location: [facility];  Service: Gynecology;  Laterality: N/A;   Family History  Problem Relation Age of Onset  . Hypertension Mother   . Hypertension Father     History of blood clots in lungs  . Lymphoma Sister     Hodgkins  . Cancer Sister   . Cancer Maternal Aunt   .  Cancer Maternal Uncle   . Cancer Maternal Grandmother   . Cancer Maternal Grandfather    History  Substance Use Topics  . Smoking status: Never Smoker   . Smokeless tobacco: Never Used  . Alcohol Use: No   OB History   Grav Para Term Preterm Abortions TAB SAB Ect Mult Living   2    2  1 1   0     Review of Systems  Constitutional: Negative for activity change and appetite change.  Eyes: Negative for pain.  Respiratory: Negative for chest tightness and shortness of breath.   Cardiovascular: Negative for chest pain and leg swelling.  Gastrointestinal: Negative for nausea, vomiting, abdominal pain and diarrhea.  Genitourinary: Negative for flank pain.  Musculoskeletal: Positive for neck pain. Negative for back pain and neck stiffness.  Skin: Negative for rash.  Neurological: Positive for headaches. Negative for weakness and numbness.  Psychiatric/Behavioral: Negative for behavioral problems.      Allergies  Review of patient's allergies indicates no known allergies.  Home Medications   Current Outpatient Rx  Name  Route  Sig  Dispense  Refill  . amLODipine (NORVASC) 5 MG tablet   Oral   Take 1 tablet (5 mg total) by mouth daily.   90 tablet   3   . aspirin-acetaminophen-caffeine (EXCEDRIN MIGRAINE) [id number] MG per tablet  Oral   Take 1 tablet by mouth every 6 (six) hours as needed for headache.         . citalopram (CELEXA) 20 MG tablet   Oral   Take 20 mg by mouth at bedtime.         . gabapentin (NEURONTIN) 100 MG capsule      Take 100mg  capsule in addition to your 300mg  capsule at night as needed for pain.   30 capsule   3   . gabapentin (NEURONTIN) 300 MG capsule   Oral   Take 1 capsule (300 mg total) by mouth at bedtime. If needed, take 100mg  capsule in addition to this qhs for pain.   30 capsule   11   . polyethylene glycol powder (GLYCOLAX/MIRALAX) powder   Oral   Take 17 g by mouth 2 (two) times daily as needed.   3350 g   1   .  propranolol (INDERAL) 20 MG tablet   Oral   Take 10 mg by mouth 2 (two) times daily.         . tamoxifen (NOLVADEX) 20 MG tablet   Oral   Take 1 tablet (20 mg total) by mouth daily.   30 tablet   11   . diazepam (VALIUM) 5 MG tablet   Oral   Take 1 tablet (5 mg total) by mouth every 6 (six) hours as needed for muscle spasms.   10 tablet   0   . ibuprofen (ADVIL,MOTRIN) 600 MG tablet   Oral   Take 1 tablet (600 mg total) by mouth every 6 (six) hours as needed.   20 tablet   0   . oxyCODONE-acetaminophen (PERCOCET/ROXICET) 5-325 MG per tablet   Oral   Take 1-2 tablets by mouth every 6 (six) hours as needed for severe pain.   10 tablet   0   . SUMAtriptan (IMITREX) 25 MG tablet      One tab my mouth at the onset of headache. May take an additional dose two hours later. Do not exceed two doses in a 7 day period.   10 tablet   0    BP 131/84  Pulse 80  Temp(Src) 98.2 F (36.8 C) (Oral)  Resp 20  SpO2 99%  LMP 04/23/2011 Physical Exam  Nursing note and vitals reviewed. Constitutional: She is oriented to person, place, and time. She appears well-developed and well-nourished.  HENT:  Head: Normocephalic and atraumatic.  Right TM normal  Eyes: EOM are normal. Pupils are equal, round, and reactive to light.  Neck: Normal range of motion. Neck supple.  Tenderness over right trapezius. Tenderness at insertion of trapezius on skull also. Range of motion intact. No cervical spine tenderness.  Cardiovascular: Normal rate, regular rhythm and normal heart sounds.   No murmur heard. Pulmonary/Chest: Effort normal and breath sounds normal. No respiratory distress. She has no wheezes. She has no rales.  Abdominal: Soft. Bowel sounds are normal. She exhibits no distension. There is no tenderness. There is no rebound and no guarding.  Musculoskeletal: Normal range of motion.  Neurological: She is alert and oriented to person, place, and time. No cranial nerve deficit.  Skin: Skin  is warm and dry.  Psychiatric: She has a normal mood and affect. Her speech is normal.    ED Course  Procedures (including critical care time) Labs Review Labs Reviewed - No data to display Imaging Review No results found.  EKG Interpretation   None  MDM   Final diagnoses:  Headache  Muscle spasm    Patient headache on the right side of her head. Came from her neck and worked her way up. Tender over trapezius. This is likely the cause of pain. Patient feels somewhat better after treatment. It is felt less likely to be metastatic disease from her previous breast cancer, however she does have followup with her oncologist tomorrow. Does not appear to be vascular in nature. Will discharge home    Jasper Riling. Alvino Chapel, MD 12/16/13 1257

## 2013-12-16 NOTE — ED Notes (Signed)
Pt from home c/o right sided head and neck pain x 5 days. Denies nausea and vomiting. Hx of migraines but states this is different pain.

## 2013-12-16 NOTE — Discharge Instructions (Signed)
°  Muscle Cramps and Spasms °Muscle cramps and spasms occur when a muscle or muscles tighten and you have no control over this tightening (involuntary muscle contraction). They are a common problem and can develop in any muscle. The most common place is in the calf muscles of the leg. Both muscle cramps and muscle spasms are involuntary muscle contractions, but they also have differences:  °· Muscle cramps are sporadic and painful. They may last a few seconds to a quarter of an hour. Muscle cramps are often more forceful and last longer than muscle spasms. °· Muscle spasms may or may not be painful. They may also last just a few seconds or much longer. °CAUSES  °It is uncommon for cramps or spasms to be due to a serious underlying problem. In many cases, the cause of cramps or spasms is unknown. Some common causes are:  °· Overexertion.   °· Overuse from repetitive motions (doing the same thing over and over).   °· Remaining in a certain position for a long period of time.   °· Improper preparation, form, or technique while performing a sport or activity.   °· Dehydration.   °· Injury.   °· Side effects of some medicines.   °· Abnormally low levels of the salts and ions in your blood (electrolytes), especially potassium and calcium. This could happen if you are taking water pills (diuretics) or you are pregnant.   °Some underlying medical problems can make it more likely to develop cramps or spasms. These include, but are not limited to:  °· Diabetes.   °· Parkinson disease.   °· Hormone disorders, such as thyroid problems.   °· Alcohol abuse.   °· Diseases specific to muscles, joints, and bones.   °· Blood vessel disease where not enough blood is getting to the muscles.   °HOME CARE INSTRUCTIONS  °· Stay well hydrated. Drink enough water and fluids to keep your urine clear or pale yellow. °· It may be helpful to massage, stretch, and relax the affected muscle. °· For tight or tense muscles, use a warm towel, heating  pad, or hot shower water directed to the affected area. °· If you are sore or have pain after a cramp or spasm, applying ice to the affected area may relieve discomfort. °· Put ice in a plastic bag. °· Place a towel between your skin and the bag. °· Leave the ice on for 15-20 minutes, 03-04 times a day. °· Medicines used to treat a known cause of cramps or spasms may help reduce their frequency or severity. Only take over-the-counter or prescription medicines as directed by your caregiver. °SEEK MEDICAL CARE IF:  °Your cramps or spasms get more severe, more frequent, or do not improve over time.  °MAKE SURE YOU:  °· Understand these instructions. °· Will watch your condition. °· Will get help right away if you are not doing well or get worse. °Document Released: 04/09/2002 Document Revised: 02/12/2013 Document Reviewed: 10/04/2012 °ExitCare® Patient Information ©2014 [employer]. ° ° °

## 2013-12-17 ENCOUNTER — Other Ambulatory Visit (HOSPITAL_BASED_OUTPATIENT_CLINIC_OR_DEPARTMENT_OTHER): Payer: No Typology Code available for payment source

## 2013-12-17 ENCOUNTER — Telehealth: Payer: Self-pay | Admitting: Internal Medicine

## 2013-12-17 ENCOUNTER — Ambulatory Visit (HOSPITAL_BASED_OUTPATIENT_CLINIC_OR_DEPARTMENT_OTHER): Payer: No Typology Code available for payment source | Admitting: Internal Medicine

## 2013-12-17 VITALS — BP 123/80 | HR 66 | Temp 97.5°F | Resp 18 | Ht 63.0 in | Wt 161.8 lb

## 2013-12-17 DIAGNOSIS — C50419 Malignant neoplasm of upper-outer quadrant of unspecified female breast: Secondary | ICD-10-CM

## 2013-12-17 DIAGNOSIS — E876 Hypokalemia: Secondary | ICD-10-CM

## 2013-12-17 DIAGNOSIS — Z803 Family history of malignant neoplasm of breast: Secondary | ICD-10-CM

## 2013-12-17 DIAGNOSIS — M62838 Other muscle spasm: Secondary | ICD-10-CM

## 2013-12-17 DIAGNOSIS — C50919 Malignant neoplasm of unspecified site of unspecified female breast: Secondary | ICD-10-CM

## 2013-12-17 DIAGNOSIS — D509 Iron deficiency anemia, unspecified: Secondary | ICD-10-CM

## 2013-12-17 LAB — COMPREHENSIVE METABOLIC PANEL (CC13)
ALT: 16 U/L (ref 0–55)
AST: 21 U/L (ref 5–34)
Albumin: 3.6 g/dL (ref 3.5–5.0)
Alkaline Phosphatase: 75 U/L (ref 40–150)
Anion Gap: 9 mEq/L (ref 3–11)
BUN: 6.9 mg/dL — ABNORMAL LOW (ref 7.0–26.0)
CO2: 25 mEq/L (ref 22–29)
Calcium: 9 mg/dL (ref 8.4–10.4)
Chloride: 107 mEq/L (ref 98–109)
Creatinine: 0.8 mg/dL (ref 0.6–1.1)
Glucose: 98 mg/dl (ref 70–140)
Potassium: 3.4 mEq/L — ABNORMAL LOW (ref 3.5–5.1)
Sodium: 141 mEq/L (ref 136–145)
Total Bilirubin: 0.21 mg/dL (ref 0.20–1.20)
Total Protein: 7.1 g/dL (ref 6.4–8.3)

## 2013-12-17 LAB — CBC WITH DIFFERENTIAL/PLATELET
BASO%: 0.9 % (ref 0.0–2.0)
Basophils Absolute: 0.1 10*3/uL (ref 0.0–0.1)
EOS%: 1.8 % (ref 0.0–7.0)
Eosinophils Absolute: 0.1 10*3/uL (ref 0.0–0.5)
HCT: 37.4 % (ref 34.8–46.6)
HGB: 12.5 g/dL (ref 11.6–15.9)
LYMPH%: 43.4 % (ref 14.0–49.7)
MCH: 31 pg (ref 25.1–34.0)
MCHC: 33.4 g/dL (ref 31.5–36.0)
MCV: 92.9 fL (ref 79.5–101.0)
MONO#: 0.3 10*3/uL (ref 0.1–0.9)
MONO%: 6 % (ref 0.0–14.0)
NEUT#: 2.8 10*3/uL (ref 1.5–6.5)
NEUT%: 47.9 % (ref 38.4–76.8)
Platelets: 227 10*3/uL (ref 145–400)
RBC: 4.02 10*6/uL (ref 3.70–5.45)
RDW: 13.8 % (ref 11.2–14.5)
WBC: 5.8 10*3/uL (ref 3.9–10.3)
lymph#: 2.5 10*3/uL (ref 0.9–3.3)

## 2013-12-17 MED ORDER — CYCLOBENZAPRINE HCL 10 MG PO TABS: 10.0000 mg | ORAL_TABLET | Freq: Every day | ORAL | Status: DC

## 2013-12-17 NOTE — Telephone Encounter (Signed)
gv pt appt schedule for march thru june °

## 2013-12-17 NOTE — Progress Notes (Signed)
[facility] OFFICE PROGRESS NOTE  Victoria Slipper, MD [place] [address]  DIAGNOSIS: Family history of breast cancer  MALIGNANT NEOPLASM OF BREAST UNSPECIFIED SITE - Plan: Basic metabolic panel (Bmet) - [facility]  Muscle spasms of neck - Plan: cyclobenzaprine (FLEXERIL) 10 MG tablet  Chief Complaint  Patient presents with  . MALIGNANT NEOPLASM OF BREAST UNSPECIFIED SITE      MALIGNANT NEOPLASM OF BREAST UNSPECIFIED SITE   08/02/2003 Initial Diagnosis MALIGNANT NEOPLASM OF BREAST UNSPECIFIED SITE. High-grade DCIS of Left breast; Tumor was 4.0 cm. Sentinel lymph nodes were negative.  ER, 24%; PR 2%.  Patient had saline implant.    08/02/2003 Surgery L mastectomy with reconstruction   05/07/2010 Surgery R port-a-cath placement   05/11/2010 Relapse/Recurrence Invasive ductal carcinoma of L breast; ER: 99%, positive; PR: 53%, positive; Ki 67 (mib-1): 20%; hER@ neu by FISH without ampliication; ratio of her 2 CEP 17 was 1.17.    05/28/2010 Surgery Local excision of the L invasive ductal carcinoma   06/08/2010 - 08/10/2010 Chemotherapy Completed 4 cycles of adjuvant chemotherapy with cytoxan and taxotere in combination with neulasta    09/08/2010 - 11/04/2010 Radiation Therapy XRT was given to the left breast and chall wall consisting of 4780 in 26 fractions with an 1800 cGy boost in 9 fractions    11/04/2010 - 04/17/2011 Chemotherapy Tamoxifen taken sporadically and discontinued due to vaginal bleeding.    08/30/2011 Surgery Laparoscopic assisted vaginal hysterectomy.   04/10/2012 Imaging MRI of the brain normal.  No cause of the patient's headache identified.    12/17/2012 Imaging CT scan of abdomen and pelvis showed no acute findings in the abdomen or pelvis.  There were felt to be hepatic hemangiomas present, mesuring 2.5 cm in the superior right heaptic lobe at the hepatic dome and a 7 mm lesion located anteriorly.    08/23/2013 Imaging Digital diagnostic  unilateral right mammogram showed no mamographic or sonographic evidence of malignancy, right breast.      09/11/2013 -  Chemotherapy Tamoxifen resumed.  to complete 5 years.      INTERVAL HISTORY: Victoria Delacruz 43 y.o. female with history as outlined above is here for follow-up.  She was las seen by me on 09/11/2013.  Today she reports compliance to her tamoxifen.  She went to ER yesterday due to headache in right neck muscle spasms.  She reports receiving toradol shot and having some relief but the spasm returned today.  She is now off her HCTZ due to recurrent hypokalemia.  She states generalized pain in her neck for over one month and it worsening with headache for nearly one week.  She denies a recent history of migraines. Per records, she had migraine headaches starting around 2000.  She does have fibromyalgia.    MEDICAL HISTORY: Past Medical History  Diagnosis Date  . ECTOPIC PREGNANCY 1997 and 2011    x 2  . Hypertension   . Fibromyalgia   . Fibroid   . Breast cancer 2004 and 2011  . Anemia   . Arthritis   . Dizziness   . Family history of breast cancer     grandmother  . Blood transfusion 05/17/11, 05/25/11    anemia  . Depression     INTERIM HISTORY: has MALIGNANT NEOPLASM OF BREAST UNSPECIFIED SITE; ANEMIA, IRON DEFICIENCY; MIGRAINE HEADACHE; Hypertension; Hand pain; Neuropathy; Night sweats; Fatigue; Nipple discharge; SOB (shortness of breath); Family history of breast cancer; History of ectopic pregnancy; Fibromyalgia; S/P laparoscopic  assisted vaginal hysterectomy (LAVH); RUQ pain; Abdominal pain in female patient; Depression; IBS (irritable bowel syndrome); Hypokalemia; and Health care maintenance on her problem list.    ALLERGIES:  has No Known Allergies.  MEDICATIONS: has a current medication list which includes the following prescription(s): amlodipine, aspirin-acetaminophen-caffeine, citalopram, diazepam, gabapentin, gabapentin, ibuprofen, oxycodone-acetaminophen,  polyethylene glycol powder, propranolol, sumatriptan, tamoxifen, and cyclobenzaprine.  SURGICAL HISTORY:  Past Surgical History  Procedure Laterality Date  . Laparoscopy for ectopic pregnancy    . Laparoscopy w/ mini-laparotomy    . Reconstruction breast immediate / delayed w/ tissue expander  2004  . Mastectomy  2004    complete with reconstruction x 3, no b/p punctures to left arm  . Laparoscopic assisted vaginal hysterectomy  08/30/2011    Procedure: LAPAROSCOPIC ASSISTED VAGINAL HYSTERECTOMY;  Surgeon: Osborne Oman, MD;  Location: [facility];  Service: Gynecology;  Laterality: N/A;    REVIEW OF SYSTEMS:   Constitutional: Denies fevers, chills or abnormal weight loss; + headache and neck spasms Eyes: Denies blurriness of vision Ears, nose, mouth, throat, and face: Denies mucositis or sore throat Respiratory: Denies cough, dyspnea or wheezes Cardiovascular: Denies palpitation, chest discomfort or lower extremity swelling Gastrointestinal:  Denies nausea, heartburn or change in bowel habits Skin: Denies abnormal skin rashes Lymphatics: Denies new lymphadenopathy or easy bruising Neurological:Denies numbness, tingling or new weaknesses Behavioral/Psych: Mood is stable, no new changes  All other systems were reviewed with the patient and are negative.  PHYSICAL EXAMINATION: ECOG PERFORMANCE STATUS: 0 - Asymptomatic  Blood pressure 123/80, pulse 66, temperature 97.5 F (36.4 C), temperature source Oral, resp. rate 18, height 5' 3"  (1.6 m), weight 161 lb 12.8 oz (73.392 kg), last menstrual period 04/23/2011.  GENERAL:alert, no distress and comfortable; moderately distressed due to headache and neck spasm SKIN: skin color, texture, turgor are normal, no rashes or significant lesions EYES: normal, Conjunctiva are pink and non-injected, sclera clear OROPHARYNX:no exudate, no erythema and lips, buccal mucosa, and tongue normal  NECK: supple, thyroid normal size, non-tender, without  nodularity LYMPH:  no palpable lymphadenopathy in the cervical, axillary or supraclavicular BREASTS: Recent evaluation on 09/11/2013 and mammogram on 10/03/2013.  Repeat on next visit.  LUNGS: clear to auscultation with normal breathing effort, no wheezes or rhonchi HEART: regular rate & rhythm and no murmurs and no lower extremity edema ABDOMEN:abdomen soft, non-tender and normal bowel sounds Musculoskeletal:no cyanosis of digits and no clubbing  NEURO: alert & oriented x 3 with fluent speech, no focal motor/sensory deficits  Labs:  Lab Results  Component Value Date   WBC 5.8 12/17/2013   HGB 12.5 12/17/2013   HCT 37.4 12/17/2013   MCV 92.9 12/17/2013   PLT 227 12/17/2013   NEUTROABS 2.8 12/17/2013      Chemistry      Component Value Date/Time   NA 141 12/17/2013 0912   NA 140 12/17/2012 1312   K 3.4* 12/17/2013 0912   K 3.7 12/17/2012 1312   CL 109* 02/20/2013 1044   CL 107 12/17/2012 1312   CO2 25 12/17/2013 0912   CO2 25 12/17/2012 1312   BUN 6.9* 12/17/2013 0912   BUN 9 12/17/2012 1312   CREATININE 0.8 12/17/2013 0912   CREATININE 0.66 12/17/2012 1312   CREATININE 0.81 08/22/2012 1125      Component Value Date/Time   CALCIUM 9.0 12/17/2013 0912   CALCIUM 9.0 12/17/2012 1312   ALKPHOS 75 12/17/2013 0912   ALKPHOS 77 12/17/2012 1312   AST 21 12/17/2013 0912   AST  20 12/17/2012 1312   ALT 16 12/17/2013 0912   ALT 10 12/17/2012 1312   BILITOT 0.21 12/17/2013 0912   BILITOT 0.2* 12/17/2012 1312       Basic Metabolic Panel:  Recent Labs Lab 12/17/13 0912  NA 141  K 3.4*  CO2 25  GLUCOSE 98  BUN 6.9*  CREATININE 0.8  CALCIUM 9.0   GFR Estimated Creatinine Clearance: 89.7 ml/min (by C-G formula based on Cr of 0.8). Liver Function Tests:  Recent Labs Lab 12/17/13 0912  AST 21  ALT 16  ALKPHOS 75  BILITOT 0.21  PROT 7.1  ALBUMIN 3.6   CBC:  Recent Labs Lab 12/17/13 0912  WBC 5.8  NEUTROABS 2.8  HGB 12.5  HCT 37.4  MCV 92.9  PLT 227    Studies:  No results  found.   RADIOGRAPHIC STUDIES: 10/03/2013  DIGITAL SCREENING UNILATERAL RIGHT MAMMOGRAM WITH CAD  COMPARISON: Previous exam(s).  ACR Breast Density Category c: The breasts are heterogeneously  dense, which may obscure small masses.  FINDINGS:  There are no findings suspicious for malignancy. Images were  processed with CAD.  IMPRESSION:  No mammographic evidence of malignancy. A result letter of this  screening mammogram will be mailed directly to the patient.  RECOMMENDATION:  Screening mammogram in one year. (Code:SM-B-01Y)  BI-RADS CATEGORY 1: Negative   ASSESSMENT: Victoria Delacruz 43 y.o. female with a history of Family history of breast cancer  MALIGNANT NEOPLASM OF BREAST UNSPECIFIED SITE - Plan: Basic metabolic panel (Bmet) - [facility]  Muscle spasms of neck - Plan: cyclobenzaprine (FLEXERIL) 10 MG tablet   PLAN:  1. Left breast invasive duct carcinoma (ER+,PR+,HER2 negative)   - She is now over 3 years from the time of diagnosis of the invasive duct carcinoma. We discussed the importance of continuing tamoxifen to reduce her chances of local reoccurrence.   2. Mild Hypokalemia, unclear etiology --She was on HCTZ prior but this has been discontinued.  She advised on symptoms of hypokalemia includes muscle twitching,palpitations. If she notices increasing symptoms, she should present to the nearest emergency room for further evaluation. She understood this plan.  3. Neck spasms. --Provided cyclobenzaprine 10 mg qhs.  If related to her fibromyalgia, might help as well.  Counseled extensively on sedating effects.   4. RTC in 3-4 months, at which time we will check CBC and chemistries and monitor for worsening hot flashes secondary to her tamoxifen.  All questions were answered. The patient knows to call the clinic with any problems, questions or concerns. We can certainly see the patient much sooner if necessary.  I spent 15 minutes counseling the patient face to face. The  total time spent in the appointment was 25 minutes.    Alesia Oshields, MD 12/17/2013 10:59 AM

## 2013-12-18 ENCOUNTER — Ambulatory Visit: Payer: No Typology Code available for payment source | Admitting: Psychology

## 2013-12-31 ENCOUNTER — Telehealth: Payer: Self-pay | Admitting: Psychology

## 2013-12-31 NOTE — Telephone Encounter (Signed)
Victoria Delacruz called to schedule an appointment since hers was canceled secondary to weather.  We scheduled for 3/12 at 9:00.

## 2014-01-01 ENCOUNTER — Ambulatory Visit (HOSPITAL_BASED_OUTPATIENT_CLINIC_OR_DEPARTMENT_OTHER): Payer: No Typology Code available for payment source

## 2014-01-01 VITALS — BP 119/73 | HR 69 | Temp 98.1°F

## 2014-01-01 DIAGNOSIS — Z452 Encounter for adjustment and management of vascular access device: Secondary | ICD-10-CM

## 2014-01-01 DIAGNOSIS — C50419 Malignant neoplasm of upper-outer quadrant of unspecified female breast: Secondary | ICD-10-CM

## 2014-01-01 DIAGNOSIS — Z95828 Presence of other vascular implants and grafts: Secondary | ICD-10-CM

## 2014-01-01 MED ORDER — SODIUM CHLORIDE 0.9 % IJ SOLN
10.0000 mL | INTRAMUSCULAR | Status: DC | PRN
  Administered 2014-01-01: 10 mL via INTRAVENOUS
  Filled 2014-01-01: qty 10

## 2014-01-01 MED ORDER — HEPARIN SOD (PORK) LOCK FLUSH 100 UNIT/ML IV SOLN
500.0000 [IU] | Freq: Once | INTRAVENOUS | Status: AC
  Administered 2014-01-01: 500 [IU] via INTRAVENOUS
  Filled 2014-01-01: qty 5

## 2014-01-10 ENCOUNTER — Ambulatory Visit (INDEPENDENT_AMBULATORY_CARE_PROVIDER_SITE_OTHER): Payer: No Typology Code available for payment source | Admitting: Psychology

## 2014-01-10 DIAGNOSIS — F32A Depression, unspecified: Secondary | ICD-10-CM

## 2014-01-10 DIAGNOSIS — F3289 Other specified depressive episodes: Secondary | ICD-10-CM

## 2014-01-10 DIAGNOSIS — F329 Major depressive disorder, single episode, unspecified: Secondary | ICD-10-CM

## 2014-01-10 NOTE — Progress Notes (Signed)
Victoria Delacruz presents for follow-up.  Since I last saw her, her aunt has moved out of a situation that was stressful for most involved and Naveah has had her 43st birthday.  She also planned and executed a birthday celebration for her mother and is planning a big fete for Alvester Chou for his 50th.  She has had some pain that prompted her to go to the ED.  She got two shots and slept and has felt a little better.

## 2014-01-10 NOTE — Assessment & Plan Note (Signed)
Did not report on mood.  Affect is within normal limits.  Thoughts are clear and goal directed.  Energy is good.  Report of activity is very good.  I would say her mood has been relatively stable for some time despite some chronic stressors.  She is "stressed" and learning how to manage this seems reasonable.  She mentioned not being able to say no at session end.  Will bring up next session.  For example, she is stressed about [name]'s party which should, for the most part, be a eustress.  Will discuss stress management generally and may introduce / reintroduce a thought record.  Follow up in two weeks or as needed.

## 2014-01-10 NOTE — Patient Instructions (Signed)
It was good to see you today.  Please schedule a follow-up for:  3/23rd at 9:00.

## 2014-01-21 ENCOUNTER — Ambulatory Visit: Payer: No Typology Code available for payment source | Admitting: Psychology

## 2014-01-28 ENCOUNTER — Ambulatory Visit (INDEPENDENT_AMBULATORY_CARE_PROVIDER_SITE_OTHER): Payer: No Typology Code available for payment source | Admitting: Psychology

## 2014-01-28 DIAGNOSIS — F32A Depression, unspecified: Secondary | ICD-10-CM

## 2014-01-28 DIAGNOSIS — F329 Major depressive disorder, single episode, unspecified: Secondary | ICD-10-CM

## 2014-01-28 DIAGNOSIS — F3289 Other specified depressive episodes: Secondary | ICD-10-CM

## 2014-01-28 NOTE — Progress Notes (Signed)
Glendi presents for follow-up.  She reports her stress level has remained high and she thinks her fibromyalgia is worse because of it.  She is the point person for her aunt's care and she thinks she may be "burning out."  She did scale back to 4-5 hours 5-6 days a week vs. Almost the entire day most days of the week.  She has attempted to get other family members involved but it is spotty.  It is meaningful for her to care for her aunt in this way but she is feeling unbalanced.  She did elect to scale back considerably on her husband's 50th birthday because of the stress.  She also is planning a trip to [facility] in September as a way to care for herself.

## 2014-01-28 NOTE — Assessment & Plan Note (Signed)
Despite attempts to elicit mood, patient never did report on it beyond stating she is "stressed."  She did not some crying spells.  Function appears to be high taking care of her aunt and meeting household responsibilities.  Pain perception is worse.  Affect today is fine.  Looks more fatigued than usual.  Brightens when talking about [employer].  Did a little work on accepting responsibility for her feelings.  She can't change other people but she can manage her reaction to other people.  She has had some success with that but perhaps hasn't generalized it as much as she could.    She elected to come back next week.  She also thinks she needs to schedule an appointment with Dr. Dianah Field to dicuss pain issues (I think).

## 2014-02-05 ENCOUNTER — Ambulatory Visit (INDEPENDENT_AMBULATORY_CARE_PROVIDER_SITE_OTHER): Payer: No Typology Code available for payment source | Admitting: Psychology

## 2014-02-05 DIAGNOSIS — F3289 Other specified depressive episodes: Secondary | ICD-10-CM

## 2014-02-05 DIAGNOSIS — F32A Depression, unspecified: Secondary | ICD-10-CM

## 2014-02-05 DIAGNOSIS — F329 Major depressive disorder, single episode, unspecified: Secondary | ICD-10-CM

## 2014-02-05 NOTE — Progress Notes (Signed)
Victoria Delacruz presents for follow-up.  She continues reporting frustration and fatigue related to taking care of her terminally ill aunt.  She is getting help from her aunt's brother and aunt's sister but she is doing the brunt of the work in terms of visiting and caring for during the day.  She feels as though she doesn't have a choice to take the time to care for herself.  Discussed.

## 2014-02-05 NOTE — Assessment & Plan Note (Signed)
Mood is reported as frustrated.  Thoughts are clear and goal directed.  Asked about warning signs that she needs to focus more on herself and schedule breaks in between caring for others.  Her response:  "When I fall out."  Not a lot of movement in terms of behavioral change.  Did create some movement around accepting a gift from her aunt - both as a way to respect her aunt's wishes and also as accepting the gratitude that her aunt is trying to express.  She wanted to schedule again in a week or two.  Scheduled for April 7th at 9:00.

## 2014-02-12 ENCOUNTER — Ambulatory Visit (INDEPENDENT_AMBULATORY_CARE_PROVIDER_SITE_OTHER): Payer: No Typology Code available for payment source | Admitting: Psychology

## 2014-02-12 DIAGNOSIS — F329 Major depressive disorder, single episode, unspecified: Secondary | ICD-10-CM

## 2014-02-12 DIAGNOSIS — F32A Depression, unspecified: Secondary | ICD-10-CM

## 2014-02-12 DIAGNOSIS — F3289 Other specified depressive episodes: Secondary | ICD-10-CM

## 2014-02-12 NOTE — Progress Notes (Signed)
Victoria Delacruz presents for follow-up.  She said that she discussed her fatigue / burn-out with her aunt and feels better for not having lied.  She feels lighter about the situation.  She also feels grateful to her uncle who has been very helpful and continues to feel good about the care she is providing her aunt.    She is excited about the trip she is planning for September and is especially excited about the fact it is something she is doing for herself.  She states she still thinks about getting a job.  Alvester Chou is not in favor of it and she thinks her priority right now is caring for her aunt.

## 2014-02-12 NOTE — Assessment & Plan Note (Signed)
Her report of symptoms, when asked, is not significantly different.  She appears brighter and less stressed.  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  Did not have pain complaints today.    Major intervention was reinforcing the important work she is doing with her aunt and how taxing this work can be emotionally and physically.  There is much in this situation that she can not fix and she is, by her report, a fixer.  We framed it, at least in part, as bearing witness for her aunt.    We agreed to follow in two weeks.

## 2014-02-20 ENCOUNTER — Encounter: Payer: Self-pay | Admitting: Family Medicine

## 2014-02-20 ENCOUNTER — Ambulatory Visit (INDEPENDENT_AMBULATORY_CARE_PROVIDER_SITE_OTHER): Payer: No Typology Code available for payment source | Admitting: Family Medicine

## 2014-02-20 VITALS — BP 120/80 | HR 61 | Temp 97.7°F | Ht 63.0 in | Wt 160.5 lb

## 2014-02-20 DIAGNOSIS — M797 Fibromyalgia: Secondary | ICD-10-CM

## 2014-02-20 DIAGNOSIS — IMO0001 Reserved for inherently not codable concepts without codable children: Secondary | ICD-10-CM

## 2014-02-20 DIAGNOSIS — F329 Major depressive disorder, single episode, unspecified: Secondary | ICD-10-CM

## 2014-02-20 DIAGNOSIS — F3289 Other specified depressive episodes: Secondary | ICD-10-CM

## 2014-02-20 DIAGNOSIS — M62838 Other muscle spasm: Secondary | ICD-10-CM

## 2014-02-20 DIAGNOSIS — F32A Depression, unspecified: Secondary | ICD-10-CM

## 2014-02-20 MED ORDER — CYCLOBENZAPRINE HCL 10 MG PO TABS: 10.0000 mg | ORAL_TABLET | Freq: Every day | ORAL | Status: AC

## 2014-02-20 MED ORDER — GABAPENTIN 400 MG PO CAPS: 400.0000 mg | ORAL_CAPSULE | Freq: Every day | ORAL | Status: DC

## 2014-02-20 MED ORDER — OXYCODONE-ACETAMINOPHEN 5-325 MG PO TABS: 1.0000 | ORAL_TABLET | Freq: Every day | ORAL | Status: DC | PRN

## 2014-02-20 NOTE — Assessment & Plan Note (Signed)
Likely worsened by anxiety/depression. See that a/p below. Gabapentin qhs works and pt wants it represcribed as 1 400mg  pill (vs the current 300 and 100mg  pills).  - Rx gabapentin 400mg  capsule QHS. - Continue celexa. - Discussed importance / benefit of medication plus therapy. Pt is on board with this plan.  - Will attempt to get sooner appt with Dr Gwenlyn Saran or f/u with me in 2 weeks. - Will ask oncology about chemotherapy interaction with neuropathic pain. - We together decided on importance of regular PCP visits to tackle her fibromyalgia along with other problems. - At f/u in 1 month, consider BMET to eval for contribution of hypoK, along with discuss other options for tx of fibromyalgia. - Stay active. - Continue working on stress level.

## 2014-02-20 NOTE — Assessment & Plan Note (Signed)
Still present per pt. Would like percocet and flexeril. - Discussed that these are short-term "band-aids." Pt agrees. - Short 59-month course of percocet daily prn #20 and flexeril qhs prn #20. - At f/u, will assess and consider other options. - Stay active.

## 2014-02-20 NOTE — Patient Instructions (Signed)
It was good to see you today.  I am sorry you are struggling through all of these issues. I know you are committed to seeing Dr Gwenlyn Saran regularly which is a very good thing. - Call to see if you can be seen by her sooner than 5/27. If her schedule is full, please come in to see me in 2 weeks. - Otherwise, we decided today to follow up every month. - If you feel like harming yourself, others, or otherwise feel like you cannot handle your anxiety, please seek emergency care at [facility].  For your fibromyalgia, - I am refilling gabapentin 400mg  nightly.  For your neck spasming, - I will fill one more month of flexeril and percocet. Remember these can make you drowsy and to use them only if absolutely needed.  Ask your oncologist about: - If chemotherapy can cause neuropathy even this far out.  Make a follow up appt in 1 month to see how these issues are doing. In the mean time, remember, moving/staying active can greatly help with pain. In the near future, we will need an appointment that is all about health maintenance when you can get a pap smear.

## 2014-02-20 NOTE — Progress Notes (Signed)
Patient ID: Victoria Delacruz, female   DOB: Nov 24, 1970, 43 y.o.   MRN: [id number] Subjective:   CC: Fibromyalgia pain, medication refills   HPI:   Fibromyalgia - Pts legs are the most affected. This weekend, feet and legs felt like they were on fire. If she sits too long, hips and joints hurt more and "lock up." She also feels tingling in arms and legs. She wonders why she still has this tingling 4 years after chemotherapy. This is affecting how she uses her left side, and she is left-handed it is affecting function. She thinks this may be related to neck spasms. She has run out of flexeril, percocet, and valium, which she got at ED visit 12/2013 and helped. Valium made her feel very "out of it" and she does not want more of this.   Neck spasms - ED visit for this recently. Still present.   Anxiety and depressive symptoms - Worsening over past 2 weeks. Feels like everything is building up. Meets with Dr Gwenlyn Saran regularly. Next appt ~1 month from now and pt would like it to be sooner. Denies SI/HI but reports "crazy thoughts" that she does not elaborate on.   Of note, pt admits that she was frustrated with not feeling like issues are being solved at our visits and that her insurance would not cover visit to rheumatology.    Review of Systems - Per HPI. Additionally, frequent headaches, not sleeping well.  SH: Alvester Chou not supptive of her doing things for others because concerned about her health.    Objective:  Physical Exam BP 120/80  Pulse 61  Temp(Src) 97.7 F (36.5 C) (Oral)  Ht 5\' 3"  (1.6 m)  Wt 160 lb 8 oz (72.802 kg)  BMI 28.44 kg/m2  LMP 04/23/2011 GEN: NAD, occasional loud short inhalation of air HEENT: Atraumatic, normocephalic, neck supple, EOMI, sclera clear  PULM: normal effort ABD: Soft, nontender, nondistended SKIN: No rash or cyanosis; warm and well-perfused EXTR: No lower extremity edema or calf tenderness PSYCH: Mood and affect mildly down and anxious though  intermittently smiles and interacts appropriately, normal rate and volume of speech. No SI/HI. NEURO: Awake, alert, no focal deficits grossly, normal speech    Assessment:     Victoria Delacruz is a 43 y.o. female with h/o fibromyalgia, anxiety, and depression here for f/u.    Plan:   Fibromyalgia - Likely worsened by anxiety/depression. See that a/p below. Gabapentin qhs works and pt wants it represcribed as 1 400mg  pill (vs the current 300 and 100mg  pills).  - Rx gabapentin 400mg  capsule QHS. - Continue celexa. - Discussed importance / benefit of medication plus therapy. Pt is on board with this plan.  - Will attempt to get sooner appt with Dr Gwenlyn Saran or f/u with me in 2 weeks. - Will ask oncology about chemotherapy interaction with neuropathic pain. - We together decided on importance of regular PCP visits to tackle her fibromyalgia along with other problems. - At f/u in 1 month, consider BMET to eval for contribution of hypoK, along with discuss other options for tx of fibromyalgia. - Stay active. - Continue working on stress level.  Neck spasms - Still present per pt. Would like percocet and flexeril. - Discussed that these are short-term "band-aids." Pt agrees. - Short 31-month course of percocet daily prn #20 and flexeril qhs prn #20. - At f/u, will assess and consider other options. - Stay active.  Anxiety/depression - Did not fully evaluate today. No SI/HI.  -  Sooner f/u with Dr Gwenlyn Saran. If unable, f/u with me in 2 weeks. - Continue celexa and gabapentin. - Emergency resources discussed. - Walk daily.  Other issues for f/u: - BP: Needs BMET check.  # Health Maintenance: will need appt in near future for well woman exam. Due for TDAP and pap smear.  Follow-up: Follow up in 1 month for f/u of spasms and fibromyalgia.   Hilton Sinclair, MD [place]

## 2014-02-20 NOTE — Assessment & Plan Note (Signed)
Did not fully evaluate today. No SI/HI.  - Sooner f/u with Dr Gwenlyn Saran. If unable, f/u with me in 2 weeks. - Continue celexa and gabapentin. - Emergency resources discussed. - Walk daily.

## 2014-02-25 ENCOUNTER — Ambulatory Visit: Payer: No Typology Code available for payment source | Admitting: Psychology

## 2014-02-26 ENCOUNTER — Telehealth: Payer: Self-pay | Admitting: Psychology

## 2014-02-26 ENCOUNTER — Ambulatory Visit (HOSPITAL_BASED_OUTPATIENT_CLINIC_OR_DEPARTMENT_OTHER): Payer: No Typology Code available for payment source

## 2014-02-26 VITALS — BP 112/73 | HR 64

## 2014-02-26 DIAGNOSIS — Z452 Encounter for adjustment and management of vascular access device: Secondary | ICD-10-CM

## 2014-02-26 DIAGNOSIS — Z95828 Presence of other vascular implants and grafts: Secondary | ICD-10-CM

## 2014-02-26 DIAGNOSIS — C50919 Malignant neoplasm of unspecified site of unspecified female breast: Secondary | ICD-10-CM

## 2014-02-26 MED ORDER — SODIUM CHLORIDE 0.9 % IJ SOLN
10.0000 mL | INTRAMUSCULAR | Status: DC | PRN
  Administered 2014-02-26: 10 mL via INTRAVENOUS
  Filled 2014-02-26: qty 10

## 2014-02-26 MED ORDER — HEPARIN SOD (PORK) LOCK FLUSH 100 UNIT/ML IV SOLN
500.0000 [IU] | Freq: Once | INTRAVENOUS | Status: AC
  Administered 2014-02-26: 500 [IU] via INTRAVENOUS
  Filled 2014-02-26: qty 5

## 2014-02-26 NOTE — Telephone Encounter (Signed)
Berry was scheduled to come in 4/27 but it was placed in the computer for 5/27 so she didn't come.  Clarified and apologized for the error.  Scheduled her for this Thursday, April 30th at 9:00.

## 2014-02-28 ENCOUNTER — Ambulatory Visit (INDEPENDENT_AMBULATORY_CARE_PROVIDER_SITE_OTHER): Payer: No Typology Code available for payment source | Admitting: Psychology

## 2014-02-28 DIAGNOSIS — F3289 Other specified depressive episodes: Secondary | ICD-10-CM

## 2014-02-28 DIAGNOSIS — F32A Depression, unspecified: Secondary | ICD-10-CM

## 2014-02-28 DIAGNOSIS — F329 Major depressive disorder, single episode, unspecified: Secondary | ICD-10-CM

## 2014-02-28 NOTE — Progress Notes (Signed)
Victoria Delacruz presents for follow-up.  She reported that she just found out her best friend has colon cancer.  The friend is still in the hospital, post-surgery.  This is hard for Victoria Delacruz given her two experiences with cancer and the toll that the treatment continues to take on her body.  Discussed her chronic illness (fibromyalgia).  As far as she knows, there is nothing else that "should be done" that is not being done by health care providers.  She is in two support groups (fibromyalgia and cancer survivor) and she recognizes others in these groups that struggle with pain, swelling and fatigue.  We broached the topic of "acceptance."  She plans to follow with Dr. Dianah Field more regularly for a time.  She is interested in losing weight.  Her significant other has lost 26 pounds.  Victoria Delacruz is walking more and may start bike riding.  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

## 2014-02-28 NOTE — Assessment & Plan Note (Signed)
Did not report on mood.  Affect was within normal limits.  Laughed heartily.  Thoughts were clear and goal directed.  No pain complaints volunteered today.  Continues to find meaning in caring for her aunt.  Attempted to set up some realistic expectations regarding her medical care.  Encouraged her to be her own advocate - if she reads something that sounds interesting to her, she can bring it up with her physicians.  With regards to weight, she accepted information about food diary and the app "My Fitness Pal."  She has a goal to lose about 15 pounds by the time she leaves for [facility] in September.  This is a reasonable goal.    See patient instructions for further plan.

## 2014-02-28 NOTE — Patient Instructions (Signed)
Please schedule a follow-up for:  5/14 at 11:00. Good luck with the weight loss.  A daily food diary can be a good way to lose weight.  Moving your body will help your mind and your body.  It is a great way to take care of yourself.

## 2014-03-14 ENCOUNTER — Ambulatory Visit (INDEPENDENT_AMBULATORY_CARE_PROVIDER_SITE_OTHER): Payer: No Typology Code available for payment source | Admitting: Psychology

## 2014-03-14 DIAGNOSIS — F3289 Other specified depressive episodes: Secondary | ICD-10-CM

## 2014-03-14 DIAGNOSIS — F329 Major depressive disorder, single episode, unspecified: Secondary | ICD-10-CM

## 2014-03-14 DIAGNOSIS — F32A Depression, unspecified: Secondary | ICD-10-CM

## 2014-03-14 NOTE — Progress Notes (Signed)
Victoria Delacruz presents for follow-up.  She has her niece Tanja Port with her.  We focused on an interaction she had with her [name] that was very frustrating.

## 2014-03-14 NOTE — Assessment & Plan Note (Signed)
Mood is likely sad and mad.  Affect is consistent.  The angry, hurt feelings she is harboring are not helpful for her pain, mood or sleep.  Discussed ways to manage.  Focused a lot on the concept of grace and vulnerability - both in herself and in her aunt.  Ultimately, I think this is a great life lesson for her and yet one she is reluctant to embrace.  Used MI techniques to help assess ambivalence and priorities get a sense of how open she it to movement.  She appeared challenged throughout the visit and was close to shutting down but might have ended up in a better place.  Will follow on May 27th at 10:00.

## 2014-03-21 ENCOUNTER — Encounter: Payer: Self-pay | Admitting: Family Medicine

## 2014-03-21 ENCOUNTER — Ambulatory Visit (INDEPENDENT_AMBULATORY_CARE_PROVIDER_SITE_OTHER): Payer: No Typology Code available for payment source | Admitting: Family Medicine

## 2014-03-21 VITALS — BP 120/78 | HR 64 | Temp 98.1°F | Wt 152.0 lb

## 2014-03-21 DIAGNOSIS — M797 Fibromyalgia: Secondary | ICD-10-CM

## 2014-03-21 DIAGNOSIS — IMO0001 Reserved for inherently not codable concepts without codable children: Secondary | ICD-10-CM

## 2014-03-21 DIAGNOSIS — R0602 Shortness of breath: Secondary | ICD-10-CM

## 2014-03-21 DIAGNOSIS — F32A Depression, unspecified: Secondary | ICD-10-CM

## 2014-03-21 DIAGNOSIS — I10 Essential (primary) hypertension: Secondary | ICD-10-CM

## 2014-03-21 DIAGNOSIS — F329 Major depressive disorder, single episode, unspecified: Secondary | ICD-10-CM

## 2014-03-21 DIAGNOSIS — F3289 Other specified depressive episodes: Secondary | ICD-10-CM

## 2014-03-21 LAB — BASIC METABOLIC PANEL
BUN: 10 mg/dL (ref 6–23)
CO2: 26 mEq/L (ref 19–32)
Calcium: 9.4 mg/dL (ref 8.4–10.5)
Chloride: 103 mEq/L (ref 96–112)
Creat: 0.82 mg/dL (ref 0.50–1.10)
Glucose, Bld: 102 mg/dL — ABNORMAL HIGH (ref 70–99)
Potassium: 3.9 mEq/L (ref 3.5–5.3)
Sodium: 140 mEq/L (ref 135–145)

## 2014-03-21 LAB — TSH: TSH: 1.459 u[IU]/mL (ref 0.350–4.500)

## 2014-03-21 NOTE — Progress Notes (Signed)
Patient ID: Victoria Delacruz, female   DOB: [birth year], 43 y.o.   MRN: [id number] Subjective:   CC: F/u fibromyalgia, depression, gasping sensation  HPI:   Fibromyalgia Patient reports feeling bad today, with "all over" pain and headaches getting worse. She was previously seen with neck spasms and fibromyalgia and had been prescribed short course of percocet and flexeril. She is also taking gabapentin and celexa. Nothing seems to be helping. She is unsure of the trigger but also endorses still taking lots of stress on with her sick aunt and having a high depression level.  Her neck, back, and head hurt. She is not motivated to do physical activity, though her friend encourages her to do more for herself.  Depression Patient reports high depression level. She takes celexa 20mg  daily and with all her medications, she feels like a "zombie" sometimes. She feels her meetings with Dr Victoria Delacruz are helpful for coming up with strategies in her life. However, she still feels stuck. PHQ-9 score 22 and passive SI without plan or intent. She reports "I am not going to do that. I love me."    Gasping sensation Pt is still having these intermittent gasping sounds. This has now been going on ~1 year and has gotten worse over the past multiple months. She denies fevers, chills, leg swelling or redness. Thinks depression triggered it but also worries if it could be due to her breast cancer or infection and wants a chest xray.   Review of Systems - Per HPI.   SH: Sick aunt PMH: H/o breast cancer    Objective:  Physical Exam BP 120/78  Pulse 64  Temp(Src) 98.1 F (36.7 C) (Oral)  Wt 152 lb (68.947 kg)  LMP 04/23/2011 GEN: NAD, well-dressed and well-nourished HEENT: AT/[place], scelra clear ,eomi, perrl, o/p clear CV: RRR, no m/r/g PULM: CTAB, normal effort other than occasional gasp EXTR: No edema or calf tenderness PSYCH: Depressed mood and affect, defeated attitude, normal rate and volume of speech NEURO:  Awake, alert, no focal deficits, normal speech and gait SKIN: No rash or cyanosis; warm and well-perfused ABD: Soft, nontender, nondistended    Assessment:     Victoria Delacruz is a 43 y.o. female with h/o depression here for f/u of fibromyalgia, depression, and gasping sensation.    Plan:     Fibromyalgia No specific pain source identified. Seems increased, and most likely this coincides with her increased stress level. She is frustrated. - BMET - Not interested in acupuncture or trigger point injections. - Declined increased celexa dose at this time. - F/u with Dr Victoria Delacruz. - Encouraged patient to do something for herself daily and see me in 1 month or sooner PRN.  Depression Severe with PHQ-9 22 and passive SI without plan or intent. Declined increased treatment at this time. Stress about sick aunt seems to be a trigger, along with pain. - Continue celexa 20mg  daily and regular f/u with Dr Victoria Delacruz (5/27). - F/u with me in 1 month or sooner PRN. - Emergency services suggested if pt feeling actively SI. She voiced understanding. - See Fibromyalgia problem. - Consider cymbalta in future if patient would like alternative, as it may help with fibromyalgia.  Gasping sensation Occasional gasp sensation that is Chronic and worsening over months. Most likely a stress-related behavior. Vitals normal and exam normal. - CXR, TSH, BMET today. - If still present at f/u, reconsider medication for anxiety and therapy surrounding anxiety.  # Health Maintenance: Due for TDAP and pap  Follow-up: Follow up in 1 month for f/u of depression and fibromyalgia.    Victoria Sinclair, MD [place]

## 2014-03-21 NOTE — Patient Instructions (Signed)
Good to see you today.  Continue taking medications daily. Follow up with me in 1 month and with Dr Gwenlyn Saran regularly. Continue thinking about increasing celexa.  We are getting labs today and a chest xray. I will call you if any results are NOT normal.  Continue working on daily activity just for you, even as minor as walking.  Best,  Hilton Sinclair, MD

## 2014-03-22 ENCOUNTER — Encounter: Payer: Self-pay | Admitting: Family Medicine

## 2014-03-22 ENCOUNTER — Telehealth: Payer: Self-pay | Admitting: Family Medicine

## 2014-03-22 ENCOUNTER — Ambulatory Visit (HOSPITAL_COMMUNITY)
Admission: RE | Admit: 2014-03-22 | Discharge: 2014-03-22 | Disposition: A | Discharge status: Home / Self Care | Payer: No Typology Code available for payment source | Source: Ambulatory Visit | Attending: Family Medicine | Admitting: Family Medicine

## 2014-03-22 DIAGNOSIS — Z853 Personal history of malignant neoplasm of breast: Secondary | ICD-10-CM | POA: Insufficient documentation

## 2014-03-22 DIAGNOSIS — R0602 Shortness of breath: Secondary | ICD-10-CM

## 2014-03-22 DIAGNOSIS — Z978 Presence of other specified devices: Secondary | ICD-10-CM | POA: Insufficient documentation

## 2014-03-22 NOTE — Assessment & Plan Note (Signed)
Severe with PHQ-9 22 and passive SI without plan or intent. Declined increased treatment at this time. Stress about sick aunt seems to be a trigger, along with pain. - Continue celexa 20mg  daily and regular f/u with Dr Gwenlyn Saran (5/27). - F/u with me in 1 month or sooner PRN. - Emergency services suggested if pt feeling actively SI. She voiced understanding. - See Fibromyalgia problem. - Consider cymbalta in future if patient would like alternative, as it may help with fibromyalgia.

## 2014-03-22 NOTE — Telephone Encounter (Signed)
Called to let patient know that labs (BMET, TSH) and CXR were normal. She voiced understanding.  Hilton Sinclair, MD

## 2014-03-22 NOTE — Assessment & Plan Note (Addendum)
No specific pain source identified and neurologically intact. Seems increased, and most likely this coincides with her increased stress level. She is frustrated. - BMET - Not interested in acupuncture or trigger point injections. - Declined increased celexa dose at this time. - F/u with Dr Gwenlyn Saran. - Encouraged patient to do something for herself daily and see me in 1 month or sooner PRN.

## 2014-03-22 NOTE — Assessment & Plan Note (Signed)
Occasional gasp sensation that is Chronic and worsening over months. Most likely a stress-related behavior. Vitals normal and exam normal. - CXR, TSH, BMET today. - If still present at f/u, reconsider medication for anxiety and therapy surrounding anxiety.

## 2014-03-27 ENCOUNTER — Ambulatory Visit: Payer: No Typology Code available for payment source | Admitting: Psychology

## 2014-03-27 ENCOUNTER — Ambulatory Visit (INDEPENDENT_AMBULATORY_CARE_PROVIDER_SITE_OTHER): Payer: No Typology Code available for payment source | Admitting: Psychology

## 2014-03-27 DIAGNOSIS — F3289 Other specified depressive episodes: Secondary | ICD-10-CM

## 2014-03-27 DIAGNOSIS — F32A Depression, unspecified: Secondary | ICD-10-CM

## 2014-03-27 DIAGNOSIS — F329 Major depressive disorder, single episode, unspecified: Secondary | ICD-10-CM

## 2014-03-27 NOTE — Progress Notes (Signed)
Victoria Delacruz presents for follow-up.  She did not have anything for the agenda.  It has been a week since she has been to see her [name].  She was told by her [name] that she was no longer needed except on Tuesdays and Thursdays.  This was very upsetting.  She has decided to take a break all together and has been using the time to rest her body and relax her mind.  She is planning a party for [name] which includes about 45 people.  She has been struggling with [name]'s mother's involvement.    She is excited about the trip to [facility].

## 2014-03-27 NOTE — Assessment & Plan Note (Signed)
No report of mood.  Affect is within normal limits.  No spontaneous report of pain.  Thoughts are clear and goal directed.    Not sure how healthy the people are in her life.  Victoria Delacruz seems to respond to feeling hurt in less than healthy (direct) ways.  This may be the best bet depending on with whom she is interacting.  Will continue to monitor.    Having trouble discerning specific therapy goals.  Will need to address next visit.  Schedule for June 18th at 11:00.

## 2014-04-16 ENCOUNTER — Telehealth: Payer: Self-pay | Admitting: Internal Medicine

## 2014-04-16 ENCOUNTER — Ambulatory Visit (HOSPITAL_BASED_OUTPATIENT_CLINIC_OR_DEPARTMENT_OTHER): Payer: No Typology Code available for payment source | Admitting: Internal Medicine

## 2014-04-16 ENCOUNTER — Other Ambulatory Visit (HOSPITAL_BASED_OUTPATIENT_CLINIC_OR_DEPARTMENT_OTHER): Payer: No Typology Code available for payment source

## 2014-04-16 ENCOUNTER — Ambulatory Visit (HOSPITAL_BASED_OUTPATIENT_CLINIC_OR_DEPARTMENT_OTHER): Payer: No Typology Code available for payment source

## 2014-04-16 VITALS — BP 117/73 | HR 67 | Temp 97.6°F | Resp 18 | Ht 63.0 in | Wt 152.0 lb

## 2014-04-16 DIAGNOSIS — C50919 Malignant neoplasm of unspecified site of unspecified female breast: Secondary | ICD-10-CM

## 2014-04-16 DIAGNOSIS — C50419 Malignant neoplasm of upper-outer quadrant of unspecified female breast: Secondary | ICD-10-CM

## 2014-04-16 DIAGNOSIS — E876 Hypokalemia: Secondary | ICD-10-CM

## 2014-04-16 DIAGNOSIS — Z170 Estrogen receptor positive status [ER+]: Secondary | ICD-10-CM

## 2014-04-16 DIAGNOSIS — Z95828 Presence of other vascular implants and grafts: Secondary | ICD-10-CM

## 2014-04-16 DIAGNOSIS — Z452 Encounter for adjustment and management of vascular access device: Secondary | ICD-10-CM

## 2014-04-16 LAB — BASIC METABOLIC PANEL (CC13)
Anion Gap: 8 mEq/L (ref 3–11)
BUN: 10.6 mg/dL (ref 7.0–26.0)
CO2: 24 mEq/L (ref 22–29)
Calcium: 9.1 mg/dL (ref 8.4–10.4)
Chloride: 108 mEq/L (ref 98–109)
Creatinine: 1 mg/dL (ref 0.6–1.1)
Glucose: 163 mg/dl — ABNORMAL HIGH (ref 70–140)
Potassium: 3.1 mEq/L — ABNORMAL LOW (ref 3.5–5.1)
Sodium: 141 mEq/L (ref 136–145)

## 2014-04-16 MED ORDER — HEPARIN SOD (PORK) LOCK FLUSH 100 UNIT/ML IV SOLN
500.0000 [IU] | Freq: Once | INTRAVENOUS | Status: AC
  Administered 2014-04-16: 500 [IU] via INTRAVENOUS
  Filled 2014-04-16: qty 5

## 2014-04-16 MED ORDER — POTASSIUM CHLORIDE ER 10 MEQ PO TBCR: 10.0000 meq | EXTENDED_RELEASE_TABLET | Freq: Two times a day (BID) | ORAL | Status: DC

## 2014-04-16 MED ORDER — SODIUM CHLORIDE 0.9 % IJ SOLN
10.0000 mL | INTRAMUSCULAR | Status: DC | PRN
  Administered 2014-04-16: 10 mL via INTRAVENOUS
  Filled 2014-04-16: qty 10

## 2014-04-16 NOTE — Telephone Encounter (Signed)
gv adn printed appts ched and avs for pt fro Aug and OCT

## 2014-04-16 NOTE — Patient Instructions (Signed)

## 2014-04-16 NOTE — Progress Notes (Signed)
[facility] OFFICE PROGRESS NOTE  Victoria Slipper, MD [place] [address]  DIAGNOSIS: MALIGNANT NEOPLASM OF BREAST UNSPECIFIED SITE - Plan: CBC with Differential, Basic metabolic panel (Bmet) - [facility], Lactate dehydrogenase (LDH) - [facility]  Hypokalemia  Chief Complaint  Patient presents with  . MALIGNANT NEOPLASM OF BREAST UNSPECIFIED SITE      MALIGNANT NEOPLASM OF BREAST UNSPECIFIED SITE   08/02/2003 Initial Diagnosis MALIGNANT NEOPLASM OF BREAST UNSPECIFIED SITE. High-grade DCIS of Left breast; Tumor was 4.0 cm. Sentinel lymph nodes were negative.  ER, 24%; PR 2%.  Patient had saline implant.    08/02/2003 Surgery L mastectomy with reconstruction   05/07/2010 Surgery R port-a-cath placement   05/11/2010 Relapse/Recurrence Invasive ductal carcinoma of L breast; ER: 99%, positive; PR: 53%, positive; Ki 67 (mib-1): 20%; hER@ neu by FISH without ampliication; ratio of her 2 CEP 17 was 1.17.    05/28/2010 Surgery Local excision of the L invasive ductal carcinoma   06/08/2010 - 08/10/2010 Chemotherapy Completed 4 cycles of adjuvant chemotherapy with cytoxan and taxotere in combination with neulasta    09/08/2010 - 11/04/2010 Radiation Therapy XRT was given to the left breast and chall wall consisting of 4780 in 26 fractions with an 1800 cGy boost in 9 fractions    11/04/2010 - 04/17/2011 Chemotherapy Tamoxifen taken sporadically and discontinued due to vaginal bleeding.    08/30/2011 Surgery Laparoscopic assisted vaginal hysterectomy.   04/10/2012 Imaging MRI of the brain normal.  No cause of the patient's headache identified.    12/17/2012 Imaging CT scan of abdomen and pelvis showed no acute findings in the abdomen or pelvis.  There were felt to be hepatic hemangiomas present, mesuring 2.5 cm in the superior right heaptic lobe at the hepatic dome and a 7 mm lesion located anteriorly.    08/23/2013 Imaging Digital diagnostic unilateral right mammogram showed no  mamographic or sonographic evidence of malignancy, right breast.      09/11/2013 -  Chemotherapy Tamoxifen resumed.  to complete 5 years.      INTERVAL HISTORY: Victoria Delacruz 43 y.o. female with history as outlined above is here for follow-up.  She was las seen by me on 12/17/2013.  Today she reports compliance to her tamoxifen.  She continues to have "hot flashes" consistent with nightsweats.  She does have fibromyalgia.  She had her port-a-cath flushed today. CXR on 03/22/2014 was negative for active cardiopulmonary disease. CT of abdomen pelvis done on 02/16 demonstrated no acute findings in the abdomen or pelvis with probable hepatic hemangiomas.   MEDICAL HISTORY: Past Medical History  Diagnosis Date  . ECTOPIC PREGNANCY 1997 and 2011    x 2  . Hypertension   . Fibromyalgia   . Fibroid   . Breast cancer 2004 and 2011  . Anemia   . Arthritis   . Dizziness   . Family history of breast cancer     grandmother  . Blood transfusion 05/17/11, 05/25/11    anemia  . Depression     INTERIM HISTORY: has MALIGNANT NEOPLASM OF BREAST UNSPECIFIED SITE; ANEMIA, IRON DEFICIENCY; MIGRAINE HEADACHE; Hypertension; Hand pain; Neuropathy; Night sweats; Fatigue; Nipple discharge; Shortness of breath; Family history of breast cancer; History of ectopic pregnancy; Fibromyalgia; S/P laparoscopic assisted vaginal hysterectomy (LAVH); RUQ pain; Abdominal pain in female patient; Depression; IBS (irritable bowel syndrome); Hypokalemia; Health care maintenance; and Neck muscle spasm on her problem list.    ALLERGIES:  has No Known Allergies.  MEDICATIONS: has a current  medication list which includes the following prescription(s): amlodipine, aspirin-acetaminophen-caffeine, citalopram, cyclobenzaprine, gabapentin, gabapentin, oxycodone-acetaminophen, polyethylene glycol powder, potassium chloride, propranolol, sumatriptan, and tamoxifen.  SURGICAL HISTORY:  Past Surgical History  Procedure Laterality Date   . Laparoscopy for ectopic pregnancy    . Laparoscopy w/ mini-laparotomy    . Reconstruction breast immediate / delayed w/ tissue expander  2004  . Mastectomy  2004    complete with reconstruction x 3, no b/p punctures to left arm  . Laparoscopic assisted vaginal hysterectomy  08/30/2011    Procedure: LAPAROSCOPIC ASSISTED VAGINAL HYSTERECTOMY;  Surgeon: Osborne Oman, MD;  Location: [facility];  Service: Gynecology;  Laterality: N/A;    REVIEW OF SYSTEMS:   Constitutional: Denies fevers, chills or abnormal weight loss; + headache and neck spasms Eyes: Denies blurriness of vision Ears, nose, mouth, throat, and face: Denies mucositis or sore throat Respiratory: Denies cough, dyspnea or wheezes Cardiovascular: Denies palpitation, chest discomfort or lower extremity swelling Gastrointestinal:  Denies nausea, heartburn or change in bowel habits Skin: Denies abnormal skin rashes Lymphatics: Denies new lymphadenopathy or easy bruising Neurological:Denies numbness, tingling or new weaknesses Behavioral/Psych: Mood is stable, no new changes  All other systems were reviewed with the patient and are negative.  PHYSICAL EXAMINATION: ECOG PERFORMANCE STATUS: 0 - Asymptomatic  Blood pressure 117/73, pulse 67, temperature 97.6 F (36.4 C), resp. rate 18, height _0  (1.6 m), weight 152 lb (68.947 kg), last menstrual period 04/23/2011.  GENERAL:alert, no distress and comfortable; NAD. SKIN: skin color, texture, turgor are normal, no rashes or significant lesions EYES: normal, Conjunctiva are pink and non-injected, sclera clear OROPHARYNX:no exudate, no erythema and lips, buccal mucosa, and tongue normal  NECK: supple, thyroid normal size, non-tender, without nodularity LYMPH:  no palpable lymphadenopathy in the cervical, axillary or supraclavicular BREASTS: R breast is benign w nipple-areolar complex, scars well healed. No lumps or nipple retraction. L breast is s/p mastectomy with a saline  implant smaller than the right breast. No lumps or masses appreciated.  LUNGS: clear to auscultation with normal breathing effort, no wheezes or rhonchi HEART: regular rate & rhythm and no murmurs and no lower extremity edema ABDOMEN:abdomen soft, non-tender and normal bowel sounds Musculoskeletal:no cyanosis of digits and no clubbing  NEURO: alert & oriented x 3 with fluent speech, no focal motor/sensory deficits  Labs:  Lab Results  Component Value Date   WBC 5.8 12/17/2013   HGB 12.5 12/17/2013   HCT 37.4 12/17/2013   MCV 92.9 12/17/2013   PLT 227 12/17/2013   NEUTROABS 2.8 12/17/2013      Chemistry      Component Value Date/Time   NA 141 04/16/2014 1315   NA 140 03/21/2014 0942   K 3.1* 04/16/2014 1315   K 3.9 03/21/2014 0942   CL 103 03/21/2014 0942   CL 109* 02/20/2013 1044   CO2 24 04/16/2014 1315   CO2 26 03/21/2014 0942   BUN 10.6 04/16/2014 1315   BUN 10 03/21/2014 0942   CREATININE 1.0 04/16/2014 1315   CREATININE 0.82 03/21/2014 0942   CREATININE 0.66 12/17/2012 1312      Component Value Date/Time   CALCIUM 9.1 04/16/2014 1315   CALCIUM 9.4 03/21/2014 0942   ALKPHOS 75 12/17/2013 0912   ALKPHOS 77 12/17/2012 1312   AST 21 12/17/2013 0912   AST 20 12/17/2012 1312   ALT 16 12/17/2013 0912   ALT 10 12/17/2012 1312   BILITOT 0.21 12/17/2013 0912   BILITOT 0.2* 12/17/2012 1312  Basic Metabolic Panel:  Recent Labs Lab 04/16/14 1315  NA 141  K 3.1*  CO2 24  GLUCOSE 163*  BUN 10.6  CREATININE 1.0  CALCIUM 9.1   GFR Estimated Creatinine Clearance: 69 ml/min (by C-G formula based on Cr of 1). Liver Function Tests: No results found for this basename: AST, ALT, ALKPHOS, BILITOT, PROT, ALBUMIN,  in the last 168 hours CBC: No results found for this basename: WBC, NEUTROABS, HGB, HCT, MCV, PLT,  in the last 168 hours  Studies:  No results found.   RADIOGRAPHIC STUDIES: 10/03/2013  DIGITAL SCREENING UNILATERAL RIGHT MAMMOGRAM WITH CAD  COMPARISON: Previous exam(s).   ACR Breast Density Category c: The breasts are heterogeneously  dense, which may obscure small masses.  FINDINGS:  There are no findings suspicious for malignancy. Images were  processed with CAD.  IMPRESSION:  No mammographic evidence of malignancy. A result letter of this  screening mammogram will be mailed directly to the patient.  RECOMMENDATION:  Screening mammogram in one year. (Code:SM-B-01Y)  BI-RADS CATEGORY 1: Negative   ASSESSMENT: Carlos Levering 43 y.o. female with a history of MALIGNANT NEOPLASM OF BREAST UNSPECIFIED SITE - Plan: CBC with Differential, Basic metabolic panel (Bmet) - [facility], Lactate dehydrogenase (LDH) - [facility]  Hypokalemia   PLAN:  1. Left breast invasive duct carcinoma (ER+,PR+,HER2 negative)   - She is now nearly 4 years from the time of diagnosis of the invasive duct carcinoma. We discussed the importance of continuing tamoxifen to reduce her chances of local reoccurrence.   2. Mild Hypokalemia, unclear etiology --She was on HCTZ prior but this has been discontinued.  She advised on symptoms of hypokalemia includes muscle twitching,palpitations. If she notices increasing symptoms, she should present to the nearest emergency room for further evaluation. We gave her a prescription for KCL 10 meq bid x 3 days.   3. Follow-up.  -- RTC in 3-4 months, at which time we will check CBC and chemistries and monitor for worsening hot flashes secondary to her tamoxifen.  All questions were answered. The patient knows to call the clinic with any problems, questions or concerns. We can certainly see the patient much sooner if necessary.  Port-a-cath flushes q 2 months.   I spent 15 minutes counseling the patient face to face. The total time spent in the appointment was 25 minutes.    Indie Nickerson, MD 04/16/2014 3:05 PM

## 2014-04-17 ENCOUNTER — Ambulatory Visit (INDEPENDENT_AMBULATORY_CARE_PROVIDER_SITE_OTHER): Payer: No Typology Code available for payment source | Admitting: Family Medicine

## 2014-04-17 VITALS — Wt 151.0 lb

## 2014-04-17 DIAGNOSIS — K625 Hemorrhage of anus and rectum: Secondary | ICD-10-CM | POA: Insufficient documentation

## 2014-04-17 DIAGNOSIS — R109 Unspecified abdominal pain: Secondary | ICD-10-CM

## 2014-04-17 LAB — PROTIME-INR
INR: 1.03 (ref <1.50)
Prothrombin Time: 13.4 seconds (ref 11.6–15.2)

## 2014-04-17 LAB — CBC
HCT: 34.9 % — ABNORMAL LOW (ref 36.0–46.0)
Hemoglobin: 12.1 g/dL (ref 12.0–15.0)
MCH: 30.6 pg (ref 26.0–34.0)
MCHC: 34.7 g/dL (ref 30.0–36.0)
MCV: 88.1 fL (ref 78.0–100.0)
Platelets: 238 10*3/uL (ref 150–400)
RBC: 3.96 MIL/uL (ref 3.87–5.11)
RDW: 14.1 % (ref 11.5–15.5)
WBC: 5.4 10*3/uL (ref 4.0–10.5)

## 2014-04-17 NOTE — Progress Notes (Signed)
   Subjective:    Patient ID: Victoria Delacruz, female    DOB: 1971-06-15, 43 y.o.   MRN: [id number]  HPI  Rectal Bleeding Had one episode yesterday afternoon with loose stool and 2 episodes this morning with BRB and loose stools.   No prior bleeding episodes.  No bleeding elsewhere.  No red colored foods or sick contact, or unusual ingestions.  No fever or rash or joint swelling.  Has felt slightly more fatigued than usual.  No lightheadness or chest pain   Abdomen Pain Has had most upper quadrant abdomen pain for at least the last year most of the time.  Somewhat worse over the last few days.  Now is mostly in LUQ.   No nausea or vomiting or fever..  No chronic nsaids or reflux symptoms.    PMH - Abdomen CT in 2014 unremarkable Never had endoscopy  Chief Complaint noted Review of Symptoms - see HPI PMH - Smoking status noted.      Review of Systems     Objective:   Physical Exam  Alert no acute distress Abdomen - mild-moderate upper abdomen pain left > R.  No guarding or rebound or HSM. No CVAT Rectal - external hemrhoidal tags.  No lesions.  No stool in vault or blood on finger or occult blood.  No masses.  Ansocopy not attempted due to discomfort of rectal - pressure not pain   Able to move around room and exam table without discomfort      Assessment & Plan:

## 2014-04-17 NOTE — Assessment & Plan Note (Signed)
Does not seem to be an acute abdomen with obstruction or infection.  Given chronicity and now with reported rectal bleeding will refer to further evaluation

## 2014-04-17 NOTE — Patient Instructions (Signed)
Good to see you today!  Thanks for coming in.  We will refer you to gastroentology for the bleeding and abdomen pain  I will call you if your lab tests are not normal.  Otherwise we will discuss them at your next visit.  If you have severe bleeding or start to feel lightheadness or really fatigued or have fever call us  Don't take any aspirin or NSAIDS

## 2014-04-17 NOTE — Assessment & Plan Note (Signed)
Unsure of cause.  Not evident on exam.  No signs of anemia.  Will check labs and refer for endoscopy.  Is at higher risk given history of breast cancer

## 2014-04-18 LAB — HEPATIC FUNCTION PANEL
ALT: 12 U/L (ref 0–35)
AST: 19 U/L (ref 0–37)
Albumin: 3.8 g/dL (ref 3.5–5.2)
Alkaline Phosphatase: 64 U/L (ref 39–117)
Bilirubin, Direct: 0.1 mg/dL (ref 0.0–0.3)
Indirect Bilirubin: 0.4 mg/dL (ref 0.2–1.2)
Total Bilirubin: 0.5 mg/dL (ref 0.2–1.2)
Total Protein: 7.1 g/dL (ref 6.0–8.3)

## 2014-04-18 LAB — LIPASE: Lipase: 23 U/L (ref 0–75)

## 2014-04-19 ENCOUNTER — Ambulatory Visit (INDEPENDENT_AMBULATORY_CARE_PROVIDER_SITE_OTHER): Payer: No Typology Code available for payment source | Admitting: Psychology

## 2014-04-19 DIAGNOSIS — F3289 Other specified depressive episodes: Secondary | ICD-10-CM

## 2014-04-19 DIAGNOSIS — F32A Depression, unspecified: Secondary | ICD-10-CM

## 2014-04-19 DIAGNOSIS — F329 Major depressive disorder, single episode, unspecified: Secondary | ICD-10-CM

## 2014-04-19 NOTE — Progress Notes (Signed)
Waver presents for follow-up.  She is very stressed because of a recent health issue that has her worried.  She was referred to GI but may not be able to be seen secondary to insurance.  She plans on making some phone calls today to see if she can advocate for herself in any way.    She got engaged on May 28th and was initially very excited until her health changed.  She has [employer] and is quite excited about this.  She has worked very hard on it.  She hasn't been to see [name] since before our last appointment and phone calls have been tense / different.  Discussed.

## 2014-04-19 NOTE — Assessment & Plan Note (Signed)
Report of mood is stressed.  She looks more sad than normal although she brightens when talking about the party in particular.  Her health issues has her very scared.  She plans on going to see [name] today and we discussed some of what might be underlying this dynamic.  Mahum may have a pattern of not addressing shifts in relationships that are important to her and I encouraged her to remind herself the importance of her relationship with [name].  I would most like her to have a conversation where she is open and vulnerable but I am not sure she will be able to do that.  Vulnerability needs to be discussed in a future meeting.    She elected to follow-up in about two weeks.

## 2014-04-19 NOTE — Patient Instructions (Signed)
Please schedule a follow up for June 29th at 9:00. Good luck tomorrow!!!  Very excited for you and Alvester Chou.

## 2014-04-22 ENCOUNTER — Encounter: Payer: Self-pay | Admitting: Family Medicine

## 2014-04-22 ENCOUNTER — Ambulatory Visit (INDEPENDENT_AMBULATORY_CARE_PROVIDER_SITE_OTHER): Payer: No Typology Code available for payment source | Admitting: Family Medicine

## 2014-04-22 VITALS — BP 121/81 | HR 71 | Wt 149.0 lb

## 2014-04-22 DIAGNOSIS — K625 Hemorrhage of anus and rectum: Secondary | ICD-10-CM

## 2014-04-22 DIAGNOSIS — R109 Unspecified abdominal pain: Secondary | ICD-10-CM

## 2014-04-22 LAB — POCT URINALYSIS DIPSTICK
Bilirubin, UA: NEGATIVE
Glucose, UA: NEGATIVE
Ketones, UA: NEGATIVE
Leukocytes, UA: NEGATIVE
Nitrite, UA: POSITIVE
Spec Grav, UA: >=1.03
Urobilinogen, UA: 0.2
pH, UA: 6

## 2014-04-22 LAB — POCT UA - MICROSCOPIC ONLY

## 2014-04-22 LAB — POCT URINE PREGNANCY: Preg Test, Ur: NEGATIVE

## 2014-04-22 MED ORDER — OMEPRAZOLE 40 MG PO CPDR: 40.0000 mg | DELAYED_RELEASE_CAPSULE | Freq: Every day | ORAL | Status: DC

## 2014-04-22 MED ORDER — POLYETHYLENE GLYCOL 3350 17 GM/SCOOP PO POWD: 17.0000 g | Freq: Every day | ORAL | Status: DC

## 2014-04-22 NOTE — Patient Instructions (Addendum)
We are getting some labs today. I will call if anything is NOT normal. We are also helping schedule an abdominal CT scan.  It is important for you to be seen by GI though unfortunate that they do not take [employer]. Let us do 2 things: - We will find out if it is possible to get you in with [facility] or [facility]. There will be lots of paperwork to gather if possible. - It may be faster if you do this, so please call [facility], [facility], and [facility] to find out from each of them if they could set up a payment plan and if this is possible for your finances.  Take medications (omeprazole and miralax daily) for 2 weeks and follow up with me in 2w-32m. If symptoms worsen, seek immediate care.   Best,  Hilton Sinclair, MD

## 2014-04-22 NOTE — Progress Notes (Signed)
Patient ID: Victoria Delacruz, female   DOB: August 10, 1971, 43 y.o.   MRN: [id number] Subjective:   CC: Follow up abdominal pain and rectal bleeding  HPI:   Follow up abdominal pain and rectal bleeding Patient has continued lower back and abdominal pain >1 month. She had rectal bleeding 6/16 and again this morning (lots of blood in stool and when she wiped). She has had a hemorrhoid flare but this did not feel like that. Entire abdomen also is painful, 9/10 pain. Eating habits have changed in that she does not heat due to no appetite. She continues losing weight despite no exercise. She is down to 149 lbs. Urine has also smelled strong with urgency but minimal urine. Urine is normal-colored. Does not report dysuria. Denies fevers or chills. She does however constantly get hot flashes. She is having very minimal "small bits" of stool. She used laxative once. Symptoms are making her very anxious. Previous lipase, AST/ALT, WBC and Hgb normal. Denies vaginal bleeding or discharge. Sexually active but hysterectomy in 2010. Feels left sided fullness.    Review of Systems - Per HPI.  SH:  Sexually active with fiance Alvester Chou. Planning to delay wedding due to stress and finances    Objective:  Physical Exam BP 121/81  Pulse 71  Wt 149 lb (67.586 kg)  LMP 04/23/2011 GEN: NAD, seated on exam table HEENT: AT/[place], sclera clear, EOMI, PERRL ABD: Soft, nondistended, mod-severe tenderness to palpation throughhout, able to lay and sit unassisted; mild increased left-sided fullness; no organomegaly PULM: Normal effort, CTAB EXTR: No LE edema or calf tenderness SKIN: No rash or cyanosis CV: RRR by 2+ bilateral DP and radial pulses PSYCH: Mood and affect anxious     Assessment:     Victoria Delacruz is a 43 y.o. female with h/o anxiety, breast cancer, IBS, h/o ectopic pregnancy, and fibromyalgia here for f/u of abdominal pain and rectal bleeding.    Plan:     Abdominal pain with blood in stool Ddx is  fibromyalgia vs GI pain and bleeding from a malignant process. Also could be bleeding hemorrhoid. Abdomen is very tender but nonacute and patient reports no fever and has normal vital signs. H/o cancer raises possibility of GI cancer. No current active bleed. - Endoscopy with GI referral ordered last visit, but with [place] she does not qualify for this and it would not be covered. - Discussed at length, opted for CT abdomen for further evaluation. - UPT to eval for abdominal ectopic>>negative. - Increase miralax to daily. - Treat for GERD with omeprazole x 2 weeks - UA: pos nitrite and loaded bacteria - LMOVM to patient that starting nitrofurantoin and return precautions, along with recommended improving hydration. - Still very important to obtain endoscopic / GI eval.       - Patient to call to local offices to find out if any has an acceptable payment plan.      - Blue Team is going to call [place] to find if either has a charity care that would cover patient. - F/u 2 weeks - 1 mo. - Return precautions reviewed to seek sooner care or go to ED if symptoms worsen.  # Health Maintenance: Not discussed  Follow-up: Follow up in 2w-44m for f/u of abdominal pain and blood in stool.    Victoria Sinclair, MD [place]

## 2014-04-23 ENCOUNTER — Telehealth: Payer: Self-pay | Admitting: Family Medicine

## 2014-04-23 MED ORDER — NITROFURANTOIN MONOHYD MACRO 100 MG PO CAPS: 100.0000 mg | ORAL_CAPSULE | Freq: Two times a day (BID) | ORAL | Status: DC

## 2014-04-23 NOTE — Telephone Encounter (Signed)
Left message on patient's voicemail for her to call. When she calls, please let her know that her urine is suggestive of urinary tract infection. Will start nitrofurantoin x 7 days and if patient develops fevers or more severe abdominal pain, she should seek immediate care. She should also work to stay hydrated as her urine looked slightly dehydrated.  Hilton Sinclair, MD

## 2014-04-24 ENCOUNTER — Ambulatory Visit (HOSPITAL_COMMUNITY)
Admission: RE | Admit: 2014-04-24 | Discharge: 2014-04-24 | Disposition: A | Discharge status: Home / Self Care | Payer: No Typology Code available for payment source | Source: Ambulatory Visit | Attending: Family Medicine | Admitting: Family Medicine

## 2014-04-24 DIAGNOSIS — R109 Unspecified abdominal pain: Secondary | ICD-10-CM

## 2014-04-24 DIAGNOSIS — K625 Hemorrhage of anus and rectum: Secondary | ICD-10-CM | POA: Insufficient documentation

## 2014-04-24 DIAGNOSIS — Z853 Personal history of malignant neoplasm of breast: Secondary | ICD-10-CM | POA: Insufficient documentation

## 2014-04-24 MED ORDER — IOHEXOL 300 MG/ML  SOLN
80.0000 mL | Freq: Once | INTRAMUSCULAR | Status: AC | PRN
  Administered 2014-04-24: 80 mL via INTRAVENOUS

## 2014-04-29 ENCOUNTER — Ambulatory Visit: Payer: No Typology Code available for payment source | Admitting: Psychology

## 2014-04-30 NOTE — Assessment & Plan Note (Signed)
Ddx is fibromyalgia vs GI pain and bleeding from a malignant process. Also could be bleeding hemorrhoid. Abdomen is very tender but nonacute and patient reports no fever and has normal vital signs. H/o cancer raises possibility of GI cancer. No current active bleed. - Endoscopy with GI referral ordered last visit, but with [place] she does not qualify for this and it would not be covered. - Discussed at length, opted for CT abdomen for further evaluation. - UPT to eval for abdominal ectopic>>negative. - Increase miralax to daily. - Treat for GERD with omeprazole x 2 weeks - UA: pos nitrite and loaded bacteria - LMOVM to patient that starting nitrofurantoin and return precautions, along with recommended improving hydration. - Still very important to obtain endoscopic / GI eval.       - Patient to call to local offices to find out if any has an acceptable payment plan.      - Blue Team is going to call [place] to find if either has a charity care that would cover patient. - F/u 2 weeks - 1 mo. - Return precautions reviewed to seek sooner care or go to ED if symptoms worsen.

## 2014-05-02 ENCOUNTER — Ambulatory Visit (INDEPENDENT_AMBULATORY_CARE_PROVIDER_SITE_OTHER): Payer: No Typology Code available for payment source | Admitting: Psychology

## 2014-05-02 DIAGNOSIS — F32A Depression, unspecified: Secondary | ICD-10-CM

## 2014-05-02 DIAGNOSIS — F329 Major depressive disorder, single episode, unspecified: Secondary | ICD-10-CM

## 2014-05-02 DIAGNOSIS — F3289 Other specified depressive episodes: Secondary | ICD-10-CM

## 2014-05-02 NOTE — Patient Instructions (Signed)
Please schedule a follow-up for:  July 9th at 10:00.   Please consider watching this video on Fear and Vulnerability:  [email or web address]  You can also just google [place].

## 2014-05-02 NOTE — Assessment & Plan Note (Addendum)
She was somewhat withdrawn at the beginning of our meeting and reported a headache.  She grew more animated throughout.  Her memory of what caused her to pull back from [name] was not consistent with her previous report.  She acknowledged the inconsistency and we landed on the issue of how hurt and fear can easily get turned into anger.  Attempted to weave in concepts like vulnerability.  There are likely cultural issues at play.  Still - her fear of being hurt may be getting in the way of some very important relationships.    See patient instructions for further plan.

## 2014-05-02 NOTE — Progress Notes (Signed)
Victoria Delacruz presents for follow-up.  She reports the party went reasonably well but not without some hiccups.  The focus of the meeting (from my perspective) was a tendency to, when hurt, draw conclusions about people or relationships that lead to a greater distance.  The two main relationships where this is currently an issue include [name] and [name]'s mother.

## 2014-05-05 ENCOUNTER — Other Ambulatory Visit: Payer: Self-pay | Admitting: Family Medicine

## 2014-05-06 ENCOUNTER — Telehealth: Payer: Self-pay | Admitting: Family Medicine

## 2014-05-06 MED ORDER — OXYCODONE-ACETAMINOPHEN 5-325 MG PO TABS: 1.0000 | ORAL_TABLET | Freq: Every day | ORAL | Status: DC | PRN

## 2014-05-06 NOTE — Telephone Encounter (Addendum)
Called patient back to discuss labs  S: She states she is still having severe stabbing abdominal pain. She has not had any new symptoms, fevers, chills, nausea, vomiting, or further episodes of rectal bleeding after 2 episodes she told me about at last visit. She had taken nitrofurantoin for suspected UTI but this did not help. She reports normal BMs. She is not taking any aspirin or excedrin.  O: We reviewed labs/imaging: Normal CBC, CMET, lipase, and UPT. Infected-appearing UA that she knew about.  CT abdomen with persistent probable liver hemangioma and mild amt of free fluid in pelvis, which is reassuring that is has not changed and nonspecific.  A/P: DDx includes IBS, IBD, malignancy, chronic pain syndrome. Of note, 2012 removal of uterus, cervix, and bilateral fallopian tubes due to bleeding fibroids; ovaries left in place per path results. We discussed further management: - F/u with me in her scheduled appt in 2 weeks, or sooner PRN. - At f/u, consider anoscope if further rectal bleed, ESR, H pylori. - With h/o breast cancer (high grade DCIS 2004, recurrence of invasive ductal carcinoma 2011, ER+, PR+, HER2 neg), call oncology for sooner f/u to discuss abdominal pain. Will message Dr Juliann Mule. - GI still needed. Will send message to [facility] to f/u on referral to [facility] for [facility] - Discussed possibility of IBS. Start a pain diary to ID trigger foods or situations such as stress. Will try metamucil which she has at home. If fails in 2 weeks, try antispasmodic. - Pain: Pt describes as severe. She is taking celexa, gabapentin, and none are helping. She is out of percocet. Will rx short-term percocet 5-325 mg po daily prn #10 that pt can pick up. - Precepted with Dr Erin Hearing. Pt voices understanding.  Hilton Sinclair, MD

## 2014-05-06 NOTE — Telephone Encounter (Signed)
  Hi Dr Juliann Mule,  I am Victoria Delacruz's PCP. She has seen me a few times now for persistent severe abdominal pain. CMET, CBC, and lipase have been normal. CT abdomen was repeated and showed persistent mild amt of free fluid in the pelvis and liver hemangioma, seen in CT 12/2012. She is extremely concerned. We discussed that this may be IBS, but that I would also like her to f/u with you given her h/o high grade DCIS in 2004 with recurrence in 2011, just in case you had other management/eval options to offer. She should contact your office but I wanted to give you a heads up.   We are trying to get her into [facility] for GI eval with scope, since her [place] does not allow her to be seen in [place] by GI offices here. I also asked her to keep a pain diary to watch for triggers and to start psyllium. She will see me again in 2 weeks and we will try an antispasmodic at that time if no improvement.  Thanks.  Hilton Sinclair, MD

## 2014-05-06 NOTE — Telephone Encounter (Signed)
Pt called and would like someone to call her and explain what her test results and also the CT scan what did it show. jw

## 2014-05-09 ENCOUNTER — Ambulatory Visit: Payer: No Typology Code available for payment source | Admitting: Psychology

## 2014-05-09 ENCOUNTER — Telehealth: Payer: Self-pay | Admitting: Psychology

## 2014-05-09 NOTE — Telephone Encounter (Signed)
Victoria Delacruz left a VM this morning canceling her 10:00 appointment.  She asked that I call to reschedule.  Called her back and left a VM.

## 2014-05-13 ENCOUNTER — Telehealth: Payer: Self-pay | Admitting: Internal Medicine

## 2014-05-13 NOTE — Telephone Encounter (Signed)
returned pt call and sched sooner appt...pt ok and aware of d.t

## 2014-05-15 ENCOUNTER — Ambulatory Visit (HOSPITAL_BASED_OUTPATIENT_CLINIC_OR_DEPARTMENT_OTHER): Payer: No Typology Code available for payment source | Admitting: Internal Medicine

## 2014-05-15 ENCOUNTER — Telehealth: Payer: Self-pay | Admitting: Internal Medicine

## 2014-05-15 ENCOUNTER — Encounter: Payer: Self-pay | Admitting: Internal Medicine

## 2014-05-15 VITALS — BP 112/77 | HR 59 | Temp 98.2°F | Resp 18 | Ht 63.0 in | Wt 157.5 lb

## 2014-05-15 DIAGNOSIS — D509 Iron deficiency anemia, unspecified: Secondary | ICD-10-CM

## 2014-05-15 DIAGNOSIS — Z170 Estrogen receptor positive status [ER+]: Secondary | ICD-10-CM

## 2014-05-15 DIAGNOSIS — C50419 Malignant neoplasm of upper-outer quadrant of unspecified female breast: Secondary | ICD-10-CM

## 2014-05-15 DIAGNOSIS — C50919 Malignant neoplasm of unspecified site of unspecified female breast: Secondary | ICD-10-CM

## 2014-05-15 DIAGNOSIS — N951 Menopausal and female climacteric states: Secondary | ICD-10-CM

## 2014-05-15 DIAGNOSIS — K625 Hemorrhage of anus and rectum: Secondary | ICD-10-CM

## 2014-05-15 NOTE — Telephone Encounter (Signed)
gv adn rpinted appt sched and avs for pt for July Aug thru OCT....pt sched  with Dr. Carlean Purl on 9.29 @ 9am

## 2014-05-15 NOTE — Progress Notes (Signed)
[facility] OFFICE PROGRESS NOTE  [name], MD [place] [address]  DIAGNOSIS: MALIGNANT NEOPLASM OF BREAST UNSPECIFIED SITE  ANEMIA, IRON DEFICIENCY  Rectal bleeding - Plan: Ambulatory referral to Gastroenterology  Chief Complaint  Patient presents with  . Rectal Bleeding      MALIGNANT NEOPLASM OF BREAST UNSPECIFIED SITE   08/02/2003 Initial Diagnosis MALIGNANT NEOPLASM OF BREAST UNSPECIFIED SITE. High-grade DCIS of Left breast; Tumor was 4.0 cm. Sentinel lymph nodes were negative.  ER, 24%; PR 2%.  Patient had saline implant.    08/02/2003 Surgery L mastectomy with reconstruction   05/07/2010 Surgery R port-a-cath placement   05/11/2010 Relapse/Recurrence Invasive ductal carcinoma of L breast; ER: 99%, positive; PR: 53%, positive; Ki 67 (mib-1): 20%; hER@ neu by FISH without ampliication; ratio of her 2 CEP 17 was 1.17.    05/28/2010 Surgery Local excision of the L invasive ductal carcinoma   06/08/2010 - 08/10/2010 Chemotherapy Completed 4 cycles of adjuvant chemotherapy with cytoxan and taxotere in combination with neulasta    09/08/2010 - 11/04/2010 Radiation Therapy XRT was given to the left breast and chall wall consisting of 4780 in 26 fractions with an 1800 cGy boost in 9 fractions    11/04/2010 - 04/17/2011 Chemotherapy Tamoxifen taken sporadically and discontinued due to vaginal bleeding.    08/30/2011 Surgery Laparoscopic assisted vaginal hysterectomy.   04/10/2012 Imaging MRI of the brain normal.  No cause of the patient's headache identified.    12/17/2012 Imaging CT scan of abdomen and pelvis showed no acute findings in the abdomen or pelvis.  There were felt to be hepatic hemangiomas present, mesuring 2.5 cm in the superior right heaptic lobe at the hepatic dome and a 7 mm lesion located anteriorly.    08/23/2013 Imaging Digital diagnostic unilateral right mammogram showed no mamographic or sonographic evidence of malignancy, right  breast.      09/11/2013 -  Chemotherapy Tamoxifen resumed.  to complete 5 years.      INTERVAL HISTORY: Victoria Delacruz 43 y.o. female with history as outlined above is here for follow-up.  She was last seen by me on 04/16/2014.  Today she reports compliance to her tamoxifen.  She continues to have "hot flashes" consistent with nightsweats.  She does have fibromyalgia.  She had her port-a-cath flushed today. CXR on 03/22/2014 was negative for active cardiopulmonary disease. She had rectal bleeding and abdominal pain.  Her CT of abdomen pelvis done on 06/24 demonstrated no acute findings in the abdomen or pelvis with probable hepatic hemangiomas.   MEDICAL HISTORY: Past Medical History  Diagnosis Date  . ECTOPIC PREGNANCY 1997 and 2011    x 2  . Hypertension   . Fibromyalgia   . Fibroid   . Breast cancer 2004 and 2011  . Anemia   . Arthritis   . Dizziness   . Family history of breast cancer     grandmother  . Blood transfusion 05/17/11, 05/25/11    anemia  . Depression     INTERIM HISTORY: has MALIGNANT NEOPLASM OF BREAST UNSPECIFIED SITE; ANEMIA, IRON DEFICIENCY; MIGRAINE HEADACHE; Hypertension; Hand pain; Neuropathy; Night sweats; Fatigue; Nipple discharge; Shortness of breath; Family history of breast cancer; History of ectopic pregnancy; Fibromyalgia; S/P laparoscopic assisted vaginal hysterectomy (LAVH); Abdominal pain in female patient; Depression; IBS (irritable bowel syndrome); Hypokalemia; Health care maintenance; Neck muscle spasm; and Rectal bleeding on her problem list.    ALLERGIES:  has No Known Allergies.  MEDICATIONS: has a current  medication list which includes the following prescription(s): amlodipine, aspirin-acetaminophen-caffeine, citalopram, cyclobenzaprine, gabapentin, omeprazole, oxycodone-acetaminophen, polyethylene glycol powder, propranolol, and tamoxifen.  SURGICAL HISTORY:  Past Surgical History  Procedure Laterality Date  . Laparoscopy for ectopic  pregnancy    . Laparoscopy w/ mini-laparotomy    . Reconstruction breast immediate / delayed w/ tissue expander  2004  . Mastectomy  2004    complete with reconstruction x 3, no b/p punctures to left arm  . Laparoscopic assisted vaginal hysterectomy  08/30/2011    Procedure: LAPAROSCOPIC ASSISTED VAGINAL HYSTERECTOMY;  Surgeon: Osborne Oman, MD;  Location: [facility];  Service: Gynecology;  Laterality: N/A;    REVIEW OF SYSTEMS:   Constitutional: Denies fevers, chills or abnormal weight loss; + headache and neck spasms Eyes: Denies blurriness of vision Ears, nose, mouth, throat, and face: Denies mucositis or sore throat Respiratory: Denies cough, dyspnea or wheezes Cardiovascular: Denies palpitation, chest discomfort or lower extremity swelling Gastrointestinal:  Denies nausea, heartburn or change in bowel habits Skin: Denies abnormal skin rashes Lymphatics: Denies new lymphadenopathy or easy bruising Neurological:Denies numbness, tingling or new weaknesses Behavioral/Psych: Mood is stable, no new changes  All other systems were reviewed with the patient and are negative.  PHYSICAL EXAMINATION: ECOG PERFORMANCE STATUS: 0 - Asymptomatic  Blood pressure 112/77, pulse 59, temperature 98.2 F (36.8 C), temperature source Oral, resp. rate 18, height 5' 3"  (1.6 m), weight 157 lb 8 oz (71.442 kg), last menstrual period 04/23/2011.  GENERAL:alert, no distress and comfortable; NAD. SKIN: skin color, texture, turgor are normal, no rashes or significant lesions EYES: normal, Conjunctiva are pink and non-injected, sclera clear OROPHARYNX:no exudate, no erythema and lips, buccal mucosa, and tongue normal  NECK: supple, thyroid normal size, non-tender, without nodularity LYMPH:  no palpable lymphadenopathy in the cervical, axillary or supraclavicular BREASTS: No examined LUNGS: clear to auscultation with normal breathing effort, no wheezes or rhonchi HEART: regular rate & rhythm and no murmurs  and no lower extremity edema ABDOMEN:abdomen soft, non-tender and normal bowel sounds Musculoskeletal:no cyanosis of digits and no clubbing  NEURO: alert & oriented x 3 with fluent speech, no focal motor/sensory deficits  Labs:  Lab Results  Component Value Date   WBC 5.4 04/17/2014   HGB 12.1 04/17/2014   HCT 34.9* 04/17/2014   MCV 88.1 04/17/2014   PLT 238 04/17/2014   NEUTROABS 2.8 12/17/2013      Chemistry      Component Value Date/Time   NA 141 04/16/2014 1315   NA 140 03/21/2014 0942   K 3.1* 04/16/2014 1315   K 3.9 03/21/2014 0942   CL 103 03/21/2014 0942   CL 109* 02/20/2013 1044   CO2 24 04/16/2014 1315   CO2 26 03/21/2014 0942   BUN 10.6 04/16/2014 1315   BUN 10 03/21/2014 0942   CREATININE 1.0 04/16/2014 1315   CREATININE 0.82 03/21/2014 0942   CREATININE 0.66 12/17/2012 1312      Component Value Date/Time   CALCIUM 9.1 04/16/2014 1315   CALCIUM 9.4 03/21/2014 0942   ALKPHOS 64 04/17/2014 1206   ALKPHOS 75 12/17/2013 0912   AST 19 04/17/2014 1206   AST 21 12/17/2013 0912   ALT 12 04/17/2014 1206   ALT 16 12/17/2013 0912   BILITOT 0.5 04/17/2014 1206   BILITOT 0.21 12/17/2013 0912       Basic Metabolic Panel: No results found for this basename: NA, K, CL, CO2, GLUCOSE, BUN, CREATININE, CALCIUM, MG, PHOS,  in the last 168 hours GFR Estimated Creatinine  Clearance: 70.1 ml/min (by C-G formula based on Cr of 1). Liver Function Tests: No results found for this basename: AST, ALT, ALKPHOS, BILITOT, PROT, ALBUMIN,  in the last 168 hours CBC: No results found for this basename: WBC, NEUTROABS, HGB, HCT, MCV, PLT,  in the last 168 hours  Studies:  No results found.   RADIOGRAPHIC STUDIES: CT of abdomen and pelvis 04/24/2014 CT ABDOMEN AND PELVIS WITH CONTRAST  TECHNIQUE: Multidetector CT imaging of the abdomen and pelvis was performed using the standard protocol following bolus administration of intravenous contrast. CONTRAST: 65m OMNIPAQUE IOHEXOL 300 MG/ML SOLN COMPARISON:  December 17 2012 FINDINGS:  There is a 1.9 x 2.4 cm mass with peripheral nodular enhancement in the posterior right lobe liver probably of liver hemangioma unchanged compared to prior CT. The liver is otherwise normal. The spleen, pancreas, gallbladder, adrenal glands and kidneys are normal. There is no hydronephrosis bilaterally. The aorta is normal  without aneurysmal dilatation. There is no abdominal  lymphadenopathy. There is no small bowel obstruction or  diverticulitis. The appendix is normal. Partial fluid-filled bladder is normal. There is small amount of free fluid in the pelvis. Patient is status post hysterectomy. The  visualized lung bases are clear. Left breast implant is noted. No focal discrete lytic or blastic lesion is identified within the  visualized bones. IMPRESSION: No evidence of bowel obstruction. The appendix is normal. Probable liver hemangioma unchanged compared to prior CT.  Incidental finding of small amount of free fluid in the pelvis,  nonspecific.  ASSESSMENT: YCarlos Levering89y.o. female with a history of MALIGNANT NEOPLASM OF BREAST UNSPECIFIED SITE  ANEMIA, IRON DEFICIENCY  Rectal bleeding - Plan: Ambulatory referral to Gastroenterology   PLAN:   1. Rectal Bleeding, resolved. --We reviewed the CT of abdomen and pelvis obtained.  She reports no further episodes.  I agree with referral to GI.  We made a referral today.  CT did not demonstrate a mass in the colon.  If further bleeding, patient encouraged to report to emergency room for further evaluation. This is unlikely be related to her breast cancer history.   2. Left breast invasive duct carcinoma (ER+,PR+,HER2 negative)   - She is now nearly 4 years from the time of diagnosis of the invasive duct carcinoma. We discussed the importance of continuing tamoxifen to reduce her chances of local reoccurrence.   3. Follow-up.  -- RTC in 3 months, at which time we will check CBC and chemistries and monitor  for worsening hot flashes secondary to her tamoxifen.  All questions were answered. The patient knows to call the clinic with any problems, questions or concerns. We can certainly see the patient much sooner if necessary.  Port-a-cath flushes q 2 months.   I spent 15 minutes counseling the patient face to face. The total time spent in the appointment was 25 minutes.    Raynor Calcaterra, MD 05/15/2014 9:48 AM

## 2014-05-20 ENCOUNTER — Encounter: Payer: Self-pay | Admitting: Family Medicine

## 2014-05-22 ENCOUNTER — Ambulatory Visit: Payer: No Typology Code available for payment source | Admitting: Family Medicine

## 2014-05-22 IMAGING — CR DG CHEST 2V
2 series · 2 of 2 positions shown · non-contrast
Comparison: 06/10/2011

CLINICAL DATA: Breast cancer, hypertension.

CHEST - 2 VIEW

[w chest pa]
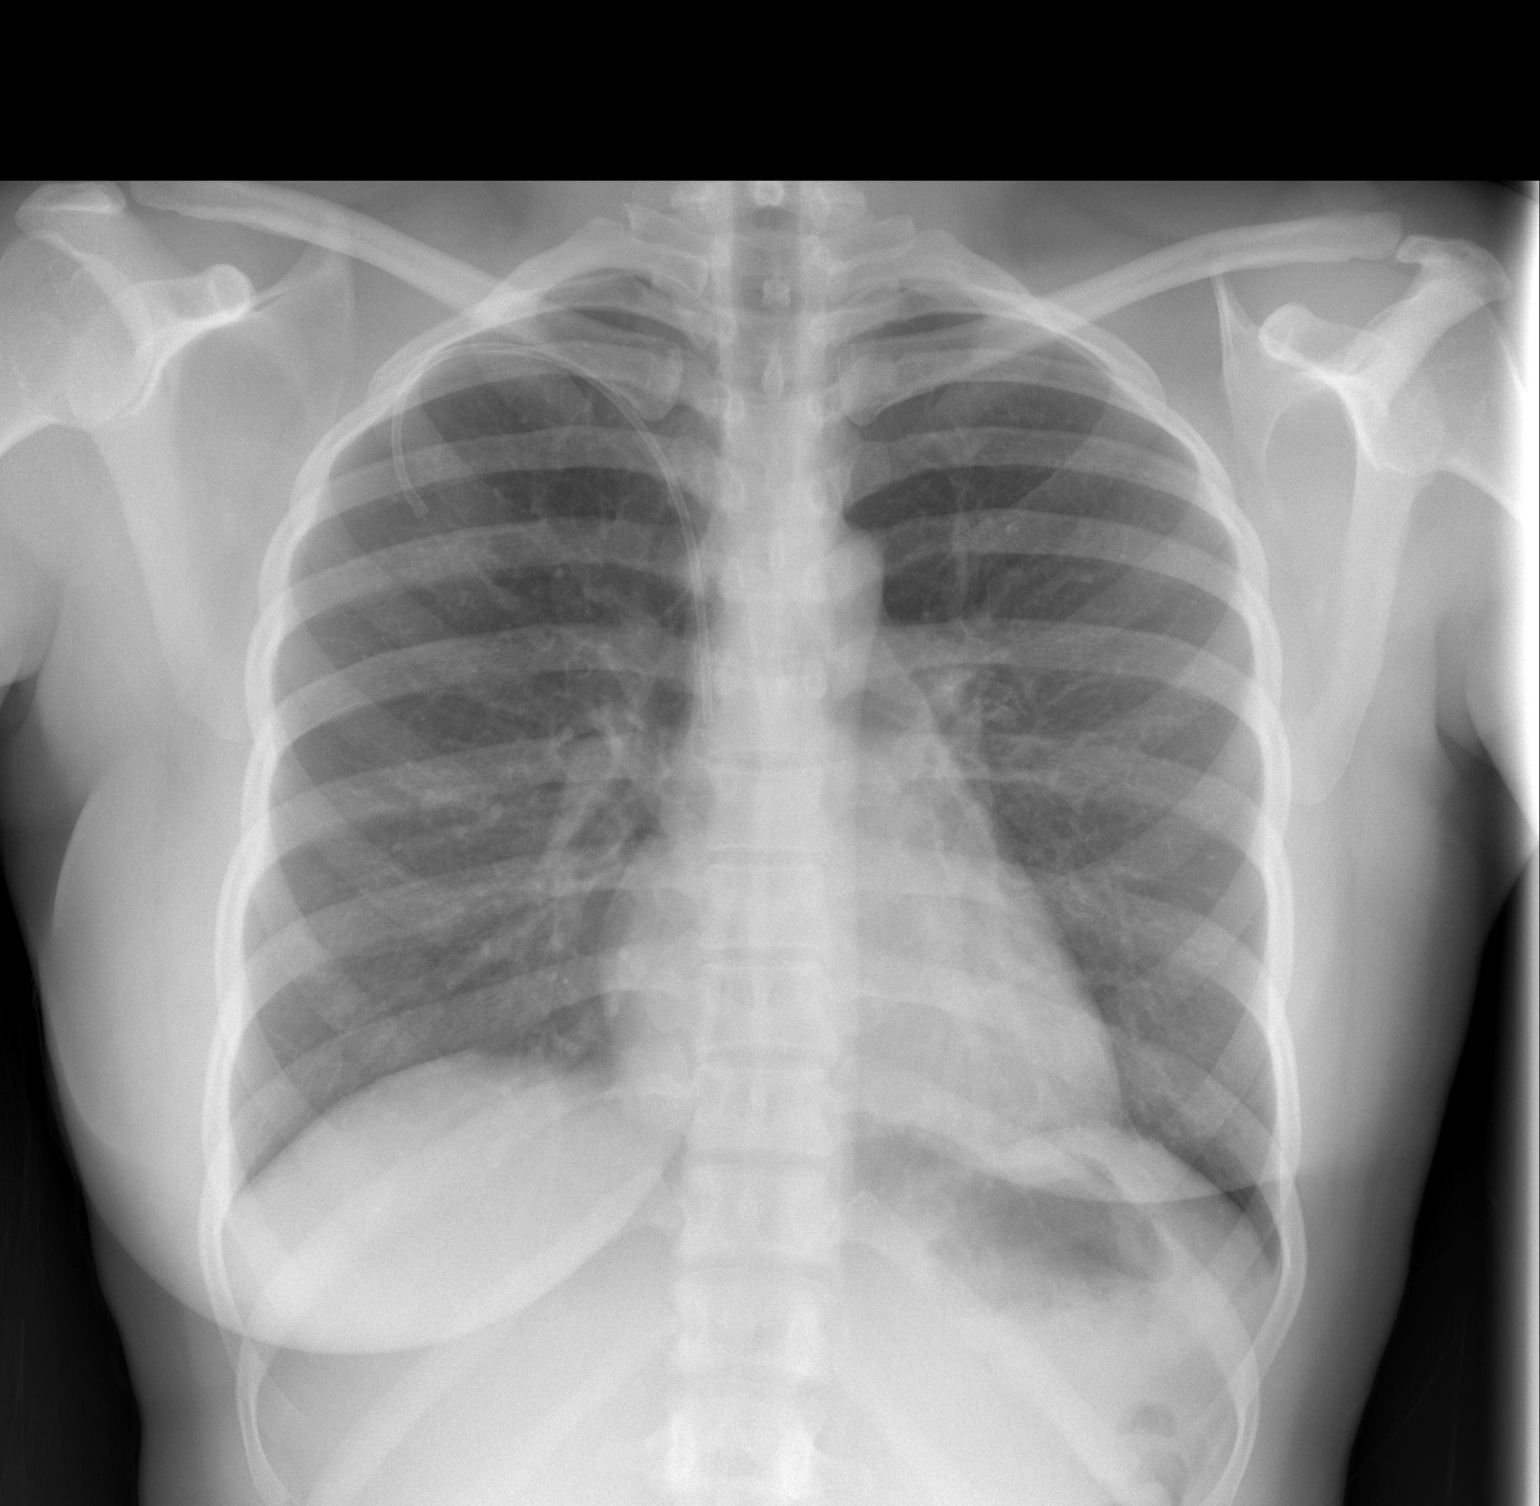

[w chest lat]
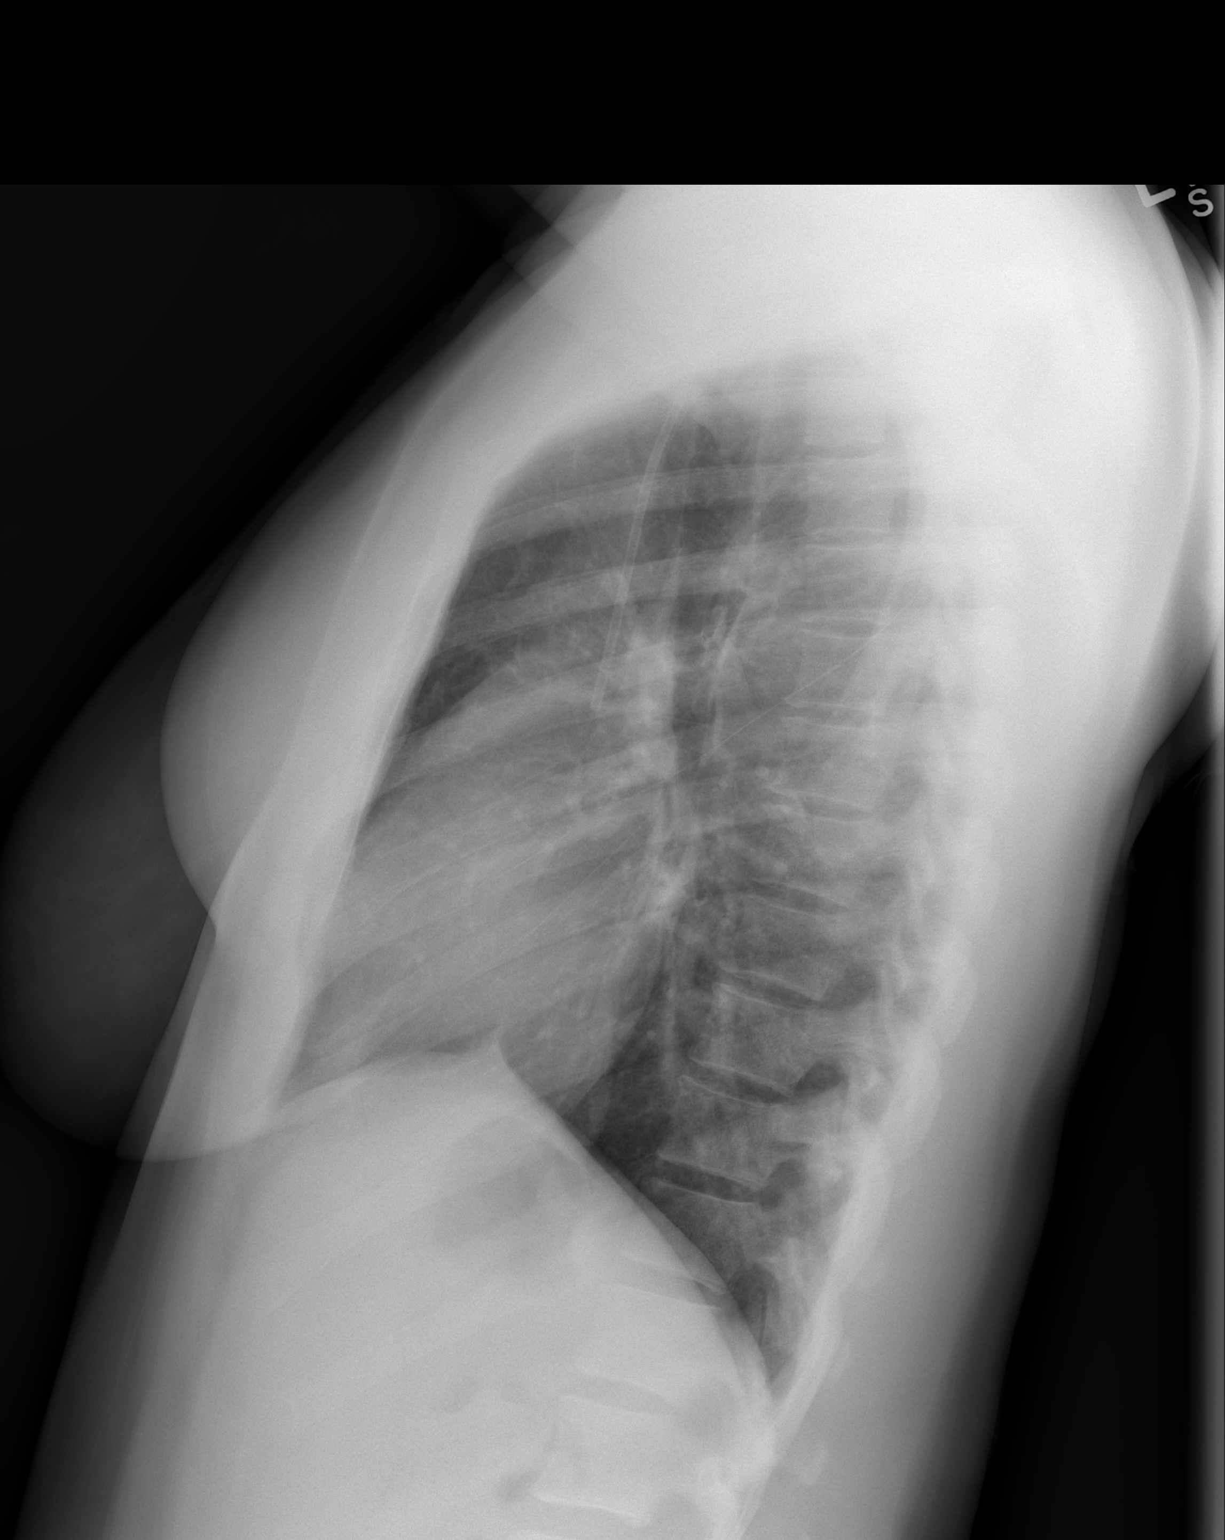

[2 of 2 positions shown; findings below may reference images not displayed]

FINDINGS: Cardiomediastinal contours are unchanged.  Right
subclavian Port-A-Cath tip projects over the proximal SVC.  No
focal consolidation, pleural effusion, or pneumothorax.
Postoperative changes of left mastectomy.
IMPRESSION: No radiographic evidence of acute cardiopulmonary process.

## 2014-06-04 ENCOUNTER — Ambulatory Visit: Payer: No Typology Code available for payment source

## 2014-06-05 ENCOUNTER — Ambulatory Visit: Payer: No Typology Code available for payment source

## 2014-06-11 ENCOUNTER — Ambulatory Visit (HOSPITAL_BASED_OUTPATIENT_CLINIC_OR_DEPARTMENT_OTHER): Payer: No Typology Code available for payment source

## 2014-06-11 VITALS — BP 122/82 | HR 70 | Temp 97.8°F

## 2014-06-11 DIAGNOSIS — C50419 Malignant neoplasm of upper-outer quadrant of unspecified female breast: Secondary | ICD-10-CM

## 2014-06-11 DIAGNOSIS — Z452 Encounter for adjustment and management of vascular access device: Secondary | ICD-10-CM

## 2014-06-11 DIAGNOSIS — Z95828 Presence of other vascular implants and grafts: Secondary | ICD-10-CM

## 2014-06-11 MED ORDER — HEPARIN SOD (PORK) LOCK FLUSH 100 UNIT/ML IV SOLN
500.0000 [IU] | Freq: Once | INTRAVENOUS | Status: AC
  Administered 2014-06-11: 500 [IU] via INTRAVENOUS
  Filled 2014-06-11: qty 5

## 2014-06-11 MED ORDER — SODIUM CHLORIDE 0.9 % IJ SOLN
10.0000 mL | INTRAMUSCULAR | Status: DC | PRN
  Administered 2014-06-11: 10 mL via INTRAVENOUS
  Filled 2014-06-11: qty 10

## 2014-06-11 NOTE — Patient Instructions (Signed)

## 2014-06-24 ENCOUNTER — Ambulatory Visit: Payer: No Typology Code available for payment source | Admitting: Psychology

## 2014-06-25 ENCOUNTER — Ambulatory Visit (INDEPENDENT_AMBULATORY_CARE_PROVIDER_SITE_OTHER): Payer: No Typology Code available for payment source | Admitting: Psychology

## 2014-06-25 DIAGNOSIS — F329 Major depressive disorder, single episode, unspecified: Secondary | ICD-10-CM

## 2014-06-25 DIAGNOSIS — F32A Depression, unspecified: Secondary | ICD-10-CM

## 2014-06-25 DIAGNOSIS — F3289 Other specified depressive episodes: Secondary | ICD-10-CM

## 2014-06-25 NOTE — Progress Notes (Signed)
Victoria Delacruz presents for follow-up.  Her beloved Victoria Delacruz died this past 2023-02-23 and the service was yesterday.  In between, she had a bridal shower.  She leaves September 20 for [facility] and plans to get married there.  Discussed her loss and how she is handling it as well as the family dynamics that have and continue to be a challenge.

## 2014-06-25 NOTE — Assessment & Plan Note (Signed)
Did not report on mood.  Affect was within normal limits.  Her thought process seemed more neutral than sessions past.  Seems like she is in a reasonably good place despite the grief she is experiencing.  She acknowledged today that she is one to hold grudges and can "run [her] mouth with the best of them."  Seems to want to take a more rational / wise mind approach to things right now.  She mentioned wanting to be a "better [place]" on this side of her beloved aunt's death.  Will continue to explore that.  Her grief may hit her harder as she gets further removed from the busyness of the service.  Will continue to monitor.

## 2014-07-02 ENCOUNTER — Ambulatory Visit: Payer: No Typology Code available for payment source | Admitting: Family Medicine

## 2014-07-05 ENCOUNTER — Ambulatory Visit: Payer: No Typology Code available for payment source | Admitting: Psychology

## 2014-07-30 ENCOUNTER — Ambulatory Visit: Payer: No Typology Code available for payment source | Admitting: Internal Medicine

## 2014-08-02 ENCOUNTER — Encounter: Payer: Self-pay | Admitting: Internal Medicine

## 2014-08-02 NOTE — Progress Notes (Signed)
Patient ID: Victoria Delacruz, female   DOB: 03-Jul-1971, 43 y.o.   MRN: [id number]  Letter sent to patient to call back and r/s appointment that she missed 07/30/14.

## 2014-08-06 ENCOUNTER — Telehealth: Payer: Self-pay | Admitting: Hematology

## 2014-08-06 ENCOUNTER — Ambulatory Visit (HOSPITAL_BASED_OUTPATIENT_CLINIC_OR_DEPARTMENT_OTHER): Payer: No Typology Code available for payment source | Admitting: Hematology

## 2014-08-06 ENCOUNTER — Ambulatory Visit (HOSPITAL_BASED_OUTPATIENT_CLINIC_OR_DEPARTMENT_OTHER): Payer: No Typology Code available for payment source

## 2014-08-06 ENCOUNTER — Encounter: Payer: Self-pay | Admitting: Hematology

## 2014-08-06 ENCOUNTER — Other Ambulatory Visit (HOSPITAL_BASED_OUTPATIENT_CLINIC_OR_DEPARTMENT_OTHER): Payer: No Typology Code available for payment source

## 2014-08-06 VITALS — BP 123/84 | HR 71 | Temp 98.3°F | Resp 18 | Ht 63.0 in | Wt 164.7 lb

## 2014-08-06 DIAGNOSIS — C50912 Malignant neoplasm of unspecified site of left female breast: Secondary | ICD-10-CM

## 2014-08-06 DIAGNOSIS — Z170 Estrogen receptor positive status [ER+]: Secondary | ICD-10-CM

## 2014-08-06 DIAGNOSIS — Z95828 Presence of other vascular implants and grafts: Secondary | ICD-10-CM

## 2014-08-06 DIAGNOSIS — C50411 Malignant neoplasm of upper-outer quadrant of right female breast: Secondary | ICD-10-CM

## 2014-08-06 DIAGNOSIS — C50919 Malignant neoplasm of unspecified site of unspecified female breast: Secondary | ICD-10-CM

## 2014-08-06 LAB — CBC WITH DIFFERENTIAL/PLATELET
BASO%: 0.5 % (ref 0.0–2.0)
Basophils Absolute: 0 10*3/uL (ref 0.0–0.1)
EOS%: 1.7 % (ref 0.0–7.0)
Eosinophils Absolute: 0.1 10*3/uL (ref 0.0–0.5)
HCT: 38 % (ref 34.8–46.6)
HGB: 13 g/dL (ref 11.6–15.9)
LYMPH%: 34.8 % (ref 14.0–49.7)
MCH: 31.3 pg (ref 25.1–34.0)
MCHC: 34.2 g/dL (ref 31.5–36.0)
MCV: 91.3 fL (ref 79.5–101.0)
MONO#: 0.3 10*3/uL (ref 0.1–0.9)
MONO%: 4.2 % (ref 0.0–14.0)
NEUT#: 3.9 10*3/uL (ref 1.5–6.5)
NEUT%: 58.8 % (ref 38.4–76.8)
Platelets: 237 10*3/uL (ref 145–400)
RBC: 4.16 10*6/uL (ref 3.70–5.45)
RDW: 13.4 % (ref 11.2–14.5)
WBC: 6.6 10*3/uL (ref 3.9–10.3)
lymph#: 2.3 10*3/uL (ref 0.9–3.3)

## 2014-08-06 LAB — BASIC METABOLIC PANEL (CC13)
Anion Gap: 8 mEq/L (ref 3–11)
BUN: 11.9 mg/dL (ref 7.0–26.0)
CO2: 24 mEq/L (ref 22–29)
Calcium: 9.4 mg/dL (ref 8.4–10.4)
Chloride: 108 mEq/L (ref 98–109)
Creatinine: 0.8 mg/dL (ref 0.6–1.1)
Glucose: 97 mg/dl (ref 70–140)
Potassium: 3.4 mEq/L — ABNORMAL LOW (ref 3.5–5.1)
Sodium: 140 mEq/L (ref 136–145)

## 2014-08-06 LAB — LACTATE DEHYDROGENASE (CC13): LDH: 242 U/L (ref 125–245)

## 2014-08-06 MED ORDER — SODIUM CHLORIDE 0.9 % IJ SOLN
10.0000 mL | INTRAMUSCULAR | Status: DC | PRN
  Administered 2014-08-06: 10 mL via INTRAVENOUS
  Filled 2014-08-06: qty 10

## 2014-08-06 MED ORDER — HEPARIN SOD (PORK) LOCK FLUSH 100 UNIT/ML IV SOLN
500.0000 [IU] | Freq: Once | INTRAVENOUS | Status: AC
  Administered 2014-08-06: 500 [IU] via INTRAVENOUS
  Filled 2014-08-06: qty 5

## 2014-08-06 NOTE — Telephone Encounter (Signed)
, °

## 2014-08-06 NOTE — Patient Instructions (Signed)

## 2014-08-06 NOTE — Progress Notes (Signed)
[facility] OFFICE PROGRESS NOTE  [name], MD [place] [address]  DIAGNOSIS: Breast cancer, left - Plan: MM Digital Diagnostic Unilat R, CBC with Differential, Comprehensive metabolic panel (Cmet) - [place], Ambulatory referral to General Surgery  Chief Complaint  Patient presents with  . Follow-up      MALIGNANT NEOPLASM OF BREAST UNSPECIFIED SITE   08/02/2003 Initial Diagnosis MALIGNANT NEOPLASM OF BREAST UNSPECIFIED SITE. High-grade DCIS of Left breast; Tumor was 4.0 cm. Sentinel lymph nodes were negative.  ER, 24%; PR 2%.  Patient had saline implant.    08/02/2003 Surgery L mastectomy with reconstruction   05/07/2010 Surgery R port-a-cath placement   05/11/2010 Relapse/Recurrence Invasive ductal carcinoma of L breast; ER: 99%, positive; PR: 53%, positive; Ki 67 (mib-1): 20%; hER@ neu by FISH without ampliication; ratio of her 2 CEP 17 was 1.17.    05/28/2010 Surgery Local excision of the L invasive ductal carcinoma   06/08/2010 - 08/10/2010 Chemotherapy Completed 4 cycles of adjuvant chemotherapy with cytoxan and taxotere in combination with neulasta    09/08/2010 - 11/04/2010 Radiation Therapy XRT was given to the left breast and chall wall consisting of 4780 in 26 fractions with an 1800 cGy boost in 9 fractions    11/04/2010 - 04/17/2011 Chemotherapy Tamoxifen taken sporadically and discontinued due to vaginal bleeding.    08/30/2011 Surgery Laparoscopic assisted vaginal hysterectomy.   04/10/2012 Imaging MRI of the brain normal.  No cause of the patient's headache identified.    12/17/2012 Imaging CT scan of abdomen and pelvis showed no acute findings in the abdomen or pelvis.  There were felt to be hepatic hemangiomas present, mesuring 2.5 cm in the superior right heaptic lobe at the hepatic dome and a 7 mm lesion located anteriorly.    08/23/2013 Imaging Digital diagnostic unilateral right mammogram showed no mamographic or sonographic evidence of  malignancy, right breast.      09/11/2013 -  Chemotherapy Tamoxifen resumed.  to complete 5 years.      INTERVAL HISTORY:  Victoria Delacruz 43 y.o. female with history as outlined above is here for follow-up.  She was last seen by Dr Juliann Mule on 05/15/14. Today she reports compliance to her tamoxifen.  She continues to have "hot flashes" consistent with nightsweats.  She does have fibromyalgia.  She had her port-a-cath flushed today and requesting that her port be removed as it limits her activities. Tamoxifen is a prothrombotic drug and I do not think that we need mediport for any specific reason now so will ask Dr Dalbert Batman to have it removed. CXR on 03/22/2014 was negative for active cardiopulmonary disease. She had rectal bleeding and abdominal pain in May and her CT of abdomen pelvis done on 06/24 demonstrated no acute findings in the abdomen or pelvis with probable hepatic hemangiomas.   Patient continues to get vasomotor symptoms and hot flashes and she carries a fan with her. She also continues to have some chronic headaches mainly on the left side of the temple which comes and goes and suggestive of migraine etiology. She also have some sore throat but no complaints of fever chills and throat examination was normal today. She is taking tamoxifen as well as aspirin for thromboprophylaxis the last visit Dr. Juliann Mule recommended getting colonoscopy but according to patient her insurance or orange card does not cover that. She did not have any more episodes of bleeding and her hemoglobin is normal today. If her bleeding recurs, we will refer her  for a colonoscopy after checking insurance and which providers are covered. She does have chronic hypokalemia of uncertain etiology and today her potassium was 3.4. I discussed with her about eating strawberries and bananas. A note from Zella Ball indicates that she does get counseling with her and she recently got married to her boyfriend of 7 years and they visited  [place] where she had good time. One of her best friends was also diagnosed with colon cancer she was upset about her.  Her right breast unilateral mammogram done on 10/04/2013 was a BI-RADS category 1 negative study. She had a left breast mastectomy as listed above.  MEDICAL HISTORY: Past Medical History  Diagnosis Date  . ECTOPIC PREGNANCY 1997 and 2011    x 2  . Hypertension   . Fibromyalgia   . Fibroid   . Breast cancer 2004 and 2011  . Anemia   . Arthritis   . Dizziness   . Family history of breast cancer     grandmother  . Blood transfusion 05/17/11, 05/25/11    anemia  . Depression     INTERIM HISTORY: has MALIGNANT NEOPLASM OF BREAST UNSPECIFIED SITE; ANEMIA, IRON DEFICIENCY; MIGRAINE HEADACHE; Hypertension; Hand pain; Neuropathy; Night sweats; Fatigue; Nipple discharge; Shortness of breath; Family history of breast cancer; History of ectopic pregnancy; Fibromyalgia; S/P laparoscopic assisted vaginal hysterectomy (LAVH); Abdominal pain in female patient; Depression; IBS (irritable bowel syndrome); Hypokalemia; Health care maintenance; Neck muscle spasm; and Rectal bleeding on her problem list.    ALLERGIES:  has No Known Allergies.  MEDICATIONS: has a current medication list which includes the following prescription(s): amlodipine, aspirin-acetaminophen-caffeine, citalopram, cyclobenzaprine, gabapentin, omeprazole, oxycodone-acetaminophen, polyethylene glycol powder, propranolol, and tamoxifen.  SURGICAL HISTORY:  Past Surgical History  Procedure Laterality Date  . Laparoscopy for ectopic pregnancy    . Laparoscopy w/ mini-laparotomy    . Reconstruction breast immediate / delayed w/ tissue expander  2004  . Mastectomy  2004    complete with reconstruction x 3, no b/p punctures to left arm  . Laparoscopic assisted vaginal hysterectomy  08/30/2011    Procedure: LAPAROSCOPIC ASSISTED VAGINAL HYSTERECTOMY;  Surgeon: Osborne Oman, MD;  Location: [facility];  Service:  Gynecology;  Laterality: N/A;    REVIEW OF SYSTEMS:   Constitutional: Denies fevers, chills or abnormal weight loss; + headache and neck spasms Eyes: Denies blurriness of vision Ears, nose, mouth, throat, and face: Denies mucositis or sore throat Respiratory: Denies cough, dyspnea or wheezes Cardiovascular: Denies palpitation, chest discomfort or lower extremity swelling Gastrointestinal:  Denies nausea, heartburn or change in bowel habits Skin: Denies abnormal skin rashes Lymphatics: Denies new lymphadenopathy or easy bruising Neurological:Denies numbness, tingling or new weaknesses Behavioral/Psych: Mood is stable, no new changes  All other systems were reviewed with the patient and are negative.  PHYSICAL EXAMINATION: ECOG PERFORMANCE STATUS: 0  Blood pressure 123/84, pulse 71, temperature 98.3 F (36.8 C), temperature source Oral, resp. rate 18, height 5' 3"  (1.6 m), weight 164 lb 11.2 oz (74.707 kg), last menstrual period 04/23/2011.  GENERAL:alert, no distress and comfortable; NAD. SKIN: skin color, texture, turgor are normal, no rashes or significant lesions EYES: normal, Conjunctiva are pink and non-injected, sclera clear OROPHARYNX:no exudate, no erythema and lips, buccal mucosa, and tongue normal  NECK: supple, thyroid normal size, non-tender, without nodularity LYMPH:  no palpable lymphadenopathy in the cervical, axillary or supraclavicular BREASTS: right breast no mass, no skin changes, no nipple changes, left reconstructed breast no skin wall changes, no adenopathy on  either side. LUNGS: clear to auscultation with normal breathing effort, no wheezes or rhonchi HEART: regular rate & rhythm and no murmurs and no lower extremity edema ABDOMEN:abdomen soft, non-tender and normal bowel sounds Musculoskeletal:no cyanosis of digits and no clubbing  NEURO: alert & oriented x 3 with fluent speech, no focal motor/sensory deficits  Labs:  Lab Results  Component Value Date    WBC 6.6 08/06/2014   HGB 13.0 08/06/2014   HCT 38.0 08/06/2014   MCV 91.3 08/06/2014   PLT 237 08/06/2014   NEUTROABS 3.9 08/06/2014      Chemistry      Component Value Date/Time   NA 140 08/06/2014 0924   NA 140 03/21/2014 0942   K 3.4* 08/06/2014 0924   K 3.9 03/21/2014 0942   CL 103 03/21/2014 0942   CL 109* 02/20/2013 1044   CO2 24 08/06/2014 0924   CO2 26 03/21/2014 0942   BUN 11.9 08/06/2014 0924   BUN 10 03/21/2014 0942   CREATININE 0.8 08/06/2014 0924   CREATININE 0.82 03/21/2014 0942   CREATININE 0.66 12/17/2012 1312      Component Value Date/Time   CALCIUM 9.4 08/06/2014 0924   CALCIUM 9.4 03/21/2014 0942   ALKPHOS 64 04/17/2014 1206   ALKPHOS 75 12/17/2013 0912   AST 19 04/17/2014 1206   AST 21 12/17/2013 0912   ALT 12 04/17/2014 1206   ALT 16 12/17/2013 0912   BILITOT 0.5 04/17/2014 1206   BILITOT 0.21 12/17/2013 0912         RADIOGRAPHIC STUDIES: CT of abdomen and pelvis 04/24/2014 CT ABDOMEN AND PELVIS WITH CONTRAST  TECHNIQUE: Multidetector CT imaging of the abdomen and pelvis was performed using the standard protocol following bolus administration of intravenous contrast. CONTRAST: 46m OMNIPAQUE IOHEXOL 300 MG/ML SOLN COMPARISON: December 17 2012 FINDINGS:  There is a 1.9 x 2.4 cm mass with peripheral nodular enhancement in the posterior right lobe liver probably of liver hemangioma unchanged compared to prior CT. The liver is otherwise normal. The spleen, pancreas, gallbladder, adrenal glands and kidneys are normal. There is no hydronephrosis bilaterally. The aorta is normal without aneurysmal dilatation. There is no abdominal lymphadenopathy. There is no small bowel obstruction or diverticulitis. The appendix is normal. Partial fluid-filled bladder is normal. There is small amount of free fluid in the pelvis. Patient is status post hysterectomy. The visualized lung bases are clear. Left breast implant is noted. No focal discrete lytic or blastic lesion is identified within the  visualized bones. IMPRESSION: No evidence of bowel obstruction. The appendix is normal. Probable liver hemangioma unchanged compared to prior CT. Incidental finding of small amount of free fluid in the pelvis, nonspecific.  Right Breast Mammogram 10/04/2013 BI-RADS 1 exam.  ASSESSMENT: YCarlos Levering89y.o. female with a history of Breast cancer, left - Plan: MM Digital Diagnostic Unilat R, CBC with Differential, Comprehensive metabolic panel (Cmet) - [facility], Ambulatory referral to General Surgery   PLAN:   1. Rectal Bleeding, resolved. --We reviewed the CT of abdomen and pelvis obtained.  She reports no further episodes.  We made a referral to GI last visit but there were insurance issues and she does not want to self-pay so she did not get evaluated. CT did not demonstrate a mass in the colon.  If further bleeding, patient encouraged to report to emergency room for further evaluation. This is unlikely be related to her breast cancer history.   2. Left breast invasive duct carcinoma (ER+,PR+,HER2 negative)   - She  is now nearly 4 years from the time of diagnosis of the invasive duct carcinoma. We discussed the importance of continuing tamoxifen to reduce her chances of local reoccurrence. I discussed the ATLAS and ATTOM studies recommending 10 years of Tamoxifen now. I did order her next screening Mammogram. Her right breast exam was normal.  3. Follow-up.  -- RTC in 6 months, at which time we will check CBC and chemistries and monitor for side effects of tamoxifen.  4. Port. --Will ask Dr Dalbert Batman to have it removed. No need esp since she is on Tamoxifen which is a clot promoting drug.  All questions were answered. The patient knows to call the clinic with any problems, questions or concerns. We can certainly see the patient much sooner if necessary.  Port-a-cath flushes q 2 months.   I spent 25 minutes counseling the patient face to face. The total time spent in the appointment was 25  minutes.    Bernadene Bell, MD Medical Hematologist/Oncologist [place] Pager: [phone] Office No: [phone]

## 2014-08-08 ENCOUNTER — Encounter: Payer: Self-pay | Admitting: Hematology

## 2014-08-08 NOTE — Progress Notes (Signed)
Patient has gccn thru 12/05/14.

## 2014-08-08 NOTE — Progress Notes (Signed)
Patient's next visit with [facility] 01/2015

## 2014-08-14 ENCOUNTER — Telehealth: Payer: Self-pay | Admitting: Hematology

## 2014-08-14 NOTE — Telephone Encounter (Signed)
, °

## 2014-08-29 ENCOUNTER — Ambulatory Visit: Payer: No Typology Code available for payment source

## 2014-09-02 ENCOUNTER — Encounter: Payer: Self-pay | Admitting: Hematology

## 2014-09-20 ENCOUNTER — Telehealth: Payer: Self-pay | Admitting: Family Medicine

## 2014-09-20 DIAGNOSIS — I10 Essential (primary) hypertension: Secondary | ICD-10-CM

## 2014-09-20 DIAGNOSIS — G43909 Migraine, unspecified, not intractable, without status migrainosus: Secondary | ICD-10-CM

## 2014-09-20 MED ORDER — PROPRANOLOL HCL 40 MG PO TABS: 20.0000 mg | ORAL_TABLET | Freq: Two times a day (BID) | ORAL | Status: DC

## 2014-09-20 NOTE — Telephone Encounter (Signed)
Refill request for propranolol. Please send meds to [facility] on [address].

## 2014-09-20 NOTE — Telephone Encounter (Signed)
I see it had previously been sent to a different pharmacy. Refilled and sent to this pharmacy. Hilton Sinclair, MD

## 2014-09-25 ENCOUNTER — Telehealth: Payer: Self-pay

## 2014-09-25 NOTE — Telephone Encounter (Signed)
Faxed pt medical records to Fernando Salinas @ [facility]

## 2014-10-04 ENCOUNTER — Ambulatory Visit
Admission: RE | Admit: 2014-10-04 | Discharge: 2014-10-04 | Disposition: A | Discharge status: Home / Self Care | Payer: 59 | Source: Ambulatory Visit | Attending: Hematology | Admitting: Hematology

## 2014-10-04 DIAGNOSIS — C50912 Malignant neoplasm of unspecified site of left female breast: Secondary | ICD-10-CM

## 2014-10-08 ENCOUNTER — Telehealth: Payer: Self-pay | Admitting: Psychology

## 2014-10-08 NOTE — Telephone Encounter (Signed)
Aubreyana called and left a VM.  I called her back and left a VM.

## 2014-10-10 ENCOUNTER — Telehealth: Payer: Self-pay

## 2014-10-10 NOTE — Telephone Encounter (Signed)
S/w pt about her port - if Dr Dalbert Batman has removed it yet. She started talking about her mammo and that her mammo doctor told her she should have an MRI of her breast. The mammo report did not reflect this. I encouraged her to call her PCP to get this ordered. She stated she is going to defer the port removal for awhile longer. She went on to say she is having breast pain and back pain that she told Dr Juliann Mule and Dr Lona Kettle about. She wants an appt sooner than April. In basket sent to [facility] to scheduler her w/Dr Burr Medico in December or January.  Appt also requested for port flush. Pt expressed appreciation of the phone call.

## 2014-10-16 ENCOUNTER — Telehealth: Payer: Self-pay | Admitting: Hematology

## 2014-10-16 ENCOUNTER — Telehealth: Payer: Self-pay

## 2014-10-16 NOTE — Telephone Encounter (Signed)
per staff message from [place] moved april appts to [facility] in [place] and added flush appt for dec. lmonvm for pt and mailed schedule.

## 2014-10-17 ENCOUNTER — Ambulatory Visit: Payer: 59 | Admitting: Psychology

## 2014-10-17 NOTE — Telephone Encounter (Signed)
Doing chart research

## 2014-11-12 ENCOUNTER — Other Ambulatory Visit: Payer: Self-pay | Admitting: *Deleted

## 2014-11-12 DIAGNOSIS — C50919 Malignant neoplasm of unspecified site of unspecified female breast: Secondary | ICD-10-CM

## 2014-11-13 ENCOUNTER — Encounter: Payer: Self-pay | Admitting: Hematology

## 2014-11-13 ENCOUNTER — Other Ambulatory Visit (HOSPITAL_BASED_OUTPATIENT_CLINIC_OR_DEPARTMENT_OTHER): Payer: 59

## 2014-11-13 ENCOUNTER — Telehealth: Payer: Self-pay | Admitting: Hematology

## 2014-11-13 ENCOUNTER — Ambulatory Visit (HOSPITAL_BASED_OUTPATIENT_CLINIC_OR_DEPARTMENT_OTHER): Payer: 59 | Admitting: Hematology

## 2014-11-13 VITALS — BP 141/97 | HR 63 | Temp 98.1°F | Resp 18 | Ht 63.0 in | Wt 166.9 lb

## 2014-11-13 DIAGNOSIS — C50912 Malignant neoplasm of unspecified site of left female breast: Secondary | ICD-10-CM

## 2014-11-13 DIAGNOSIS — C50919 Malignant neoplasm of unspecified site of unspecified female breast: Secondary | ICD-10-CM

## 2014-11-13 DIAGNOSIS — Z170 Estrogen receptor positive status [ER+]: Secondary | ICD-10-CM

## 2014-11-13 DIAGNOSIS — K625 Hemorrhage of anus and rectum: Secondary | ICD-10-CM

## 2014-11-13 LAB — COMPREHENSIVE METABOLIC PANEL (CC13)
ALT: 11 U/L (ref 0–55)
AST: 22 U/L (ref 5–34)
Albumin: 3.9 g/dL (ref 3.5–5.0)
Alkaline Phosphatase: 104 U/L (ref 40–150)
Anion Gap: 9 mEq/L (ref 3–11)
BUN: 9.9 mg/dL (ref 7.0–26.0)
CO2: 26 mEq/L (ref 22–29)
Calcium: 9 mg/dL (ref 8.4–10.4)
Chloride: 104 mEq/L (ref 98–109)
Creatinine: 1 mg/dL (ref 0.6–1.1)
EGFR: 80 mL/min/{1.73_m2} — ABNORMAL LOW (ref >90)
Glucose: 122 mg/dl (ref 70–140)
Potassium: 3.4 mEq/L — ABNORMAL LOW (ref 3.5–5.1)
Sodium: 139 mEq/L (ref 136–145)
Total Bilirubin: 0.67 mg/dL (ref 0.20–1.20)
Total Protein: 7.9 g/dL (ref 6.4–8.3)

## 2014-11-13 LAB — CBC WITH DIFFERENTIAL/PLATELET
BASO%: 1.1 % (ref 0.0–2.0)
Basophils Absolute: 0.1 10*3/uL (ref 0.0–0.1)
EOS%: 1 % (ref 0.0–7.0)
Eosinophils Absolute: 0.1 10*3/uL (ref 0.0–0.5)
HCT: 42.7 % (ref 34.8–46.6)
HGB: 14 g/dL (ref 11.6–15.9)
LYMPH%: 31.9 % (ref 14.0–49.7)
MCH: 30.5 pg (ref 25.1–34.0)
MCHC: 32.8 g/dL (ref 31.5–36.0)
MCV: 93 fL (ref 79.5–101.0)
MONO#: 0.2 10*3/uL (ref 0.1–0.9)
MONO%: 3.2 % (ref 0.0–14.0)
NEUT#: 4.6 10*3/uL (ref 1.5–6.5)
NEUT%: 62.8 % (ref 38.4–76.8)
Platelets: 263 10*3/uL (ref 145–400)
RBC: 4.59 10*6/uL (ref 3.70–5.45)
RDW: 13.3 % (ref 11.2–14.5)
WBC: 7.3 10*3/uL (ref 3.9–10.3)
lymph#: 2.3 10*3/uL (ref 0.9–3.3)

## 2014-11-13 MED ORDER — POTASSIUM CHLORIDE CRYS ER 20 MEQ PO TBCR: 20.0000 meq | EXTENDED_RELEASE_TABLET | Freq: Every day | ORAL | Status: DC

## 2014-11-13 NOTE — Telephone Encounter (Signed)
Pt confirmed labs/ov per 01/13 POF, gave pt AVS.... KJ

## 2014-11-13 NOTE — Progress Notes (Signed)
[place] OFFICE PROGRESS NOTE  Victoria Slipper, MD [place] [address]  DIAGNOSIS: Malignant neoplasm of female breast, left - Plan: CBC & Diff and Retic, Comprehensive metabolic panel (Cmet) - [facility], Ferritin, Iron and TIBC [facility]  CHIEF COMPLAIN: follow up breast cancer     Malignant neoplasm of female breast   08/02/2003 Initial Diagnosis MALIGNANT NEOPLASM OF BREAST UNSPECIFIED SITE. High-grade DCIS of Left breast; Tumor was 4.0 cm. Sentinel lymph nodes were negative.  ER, 24%; PR 2%.  Patient had saline implant.    08/02/2003 Surgery L mastectomy with reconstruction Victoria Delacruz   05/07/2010 Surgery R port-a-cath placement   05/11/2010 Relapse/Recurrence Invasive ductal carcinoma of L breast; ER: 99%, positive; PR: 53%, positive; Ki 67 (mib-1): 20%; hER@ neu by FISH without ampliication; ratio of her 2 CEP 17 was 1.17.    05/28/2010 Surgery Local excision of the L invasive ductal carcinoma by [name].   06/08/2010 - 08/10/2010 Chemotherapy Completed 4 cycles of adjuvant chemotherapy with cytoxan and taxotere in combination with neulasta    09/08/2010 - 11/04/2010 Radiation Therapy XRT was given to the left breast and chall wall consisting of 4780 in 26 fractions with an 1800 cGy boost in 9 fractions Dr Valere Dross.   11/04/2010 - 04/17/2011 Chemotherapy Tamoxifen taken sporadically and discontinued due to vaginal bleeding.    08/30/2011 Surgery Laparoscopic assisted vaginal hysterectomy.   04/10/2012 Imaging MRI of the brain normal.  No cause of the patient's headache identified.    12/17/2012 Imaging CT scan of abdomen and pelvis showed no acute findings in the abdomen or pelvis.  There were felt to be hepatic hemangiomas present, mesuring 2.5 cm in the superior right heaptic lobe at the hepatic dome and a 7 mm lesion located anteriorly.    08/23/2013 Imaging Digital diagnostic unilateral right mammogram showed no mamographic or sonographic evidence of  malignancy, right breast.      09/11/2013 -  Chemotherapy Tamoxifen resumed.  to complete 5 years.    CURRENT THERAPY: Tamoxifen started in 11/2009, held 04/2011-08/2013, and restarted on 09/11/2013   INTERVAL HISTORY:  Victoria Delacruz 44 y.o. female with history as outlined above is here for follow-up.  She was last seen by Dr Juliann Mule on 05/15/14. Today she reports compliance to her tamoxifen.  She continues to have "hot flashes" consistent with nightsweats.  She does have fibromyalgia.  She had her port-a-cath flushed today and requesting that her port be removed as it limits her activities. Tamoxifen is a prothrombotic drug and I do not think that we need mediport for any specific reason now so will ask Dr Dalbert Batman to have it removed. CXR on 03/22/2014 was negative for active cardiopulmonary disease. She had rectal bleeding and abdominal pain in May and her CT of abdomen pelvis done on 06/24 demonstrated no acute findings in the abdomen or pelvis with probable hepatic hemangiomas.   Patient continues to get vasomotor symptoms and hot flashes and she carries a fan with her. She also continues to have some chronic headaches mainly on the left side of the temple which comes and goes and suggestive of migraine etiology. She also have some sore throat but no complaints of fever chills and throat examination was normal today. She is taking tamoxifen as well as aspirin for thromboprophylaxis the last visit Dr. Juliann Mule recommended getting colonoscopy but according to patient her insurance or orange card does not cover that. She did not have any more episodes of bleeding and  her hemoglobin is normal today. If her bleeding recurs, we will refer her for a colonoscopy after checking insurance and which providers are covered. She does have chronic hypokalemia of uncertain etiology and today her potassium was 3.4. I discussed with her about eating strawberries and bananas. A note from Zella Ball indicates that she does  get counseling with her and she recently got married to her boyfriend of 7 years and they visited [place] where she had good time. One of her best friends was also diagnosed with colon cancer she was upset about her.  INTERIM HISTORY: She returns for follow up. She takes Tamoxifen daily. She has hot flushes daily, especially at night, but states it's manageable. (+) chronic joint and muscular pain from fibromyalgia, she otherwise feels well overall. Weight stable. She still has intermittent fresh blood mixed with stool and blood on toilet tissue, 5 times in the past 6 months. She is going to call GI and reschedule her appointment.   MEDICAL HISTORY: Past Medical History  Diagnosis Date  . ECTOPIC PREGNANCY 1997 and 2011    x 2  . Hypertension   . Fibromyalgia   . Fibroid   . Breast cancer 2004 and 2011  . Anemia   . Arthritis   . Dizziness   . Family history of breast cancer     grandmother  . Blood transfusion 05/17/11, 05/25/11    anemia  . Depression     ALLERGIES:  has No Known Allergies.  MEDICATIONS: has a current medication list which includes the following prescription(s): amlodipine, aspirin-acetaminophen-caffeine, cyclobenzaprine, duloxetine, gabapentin, polyethylene glycol powder, propranolol, tamoxifen, oxycodone-acetaminophen, and potassium chloride sa.  SURGICAL HISTORY:  Past Surgical History  Procedure Laterality Date  . Laparoscopy for ectopic pregnancy    . Laparoscopy w/ mini-laparotomy    . Reconstruction breast immediate / delayed w/ tissue expander  2004  . Mastectomy  2004    complete with reconstruction x 3, no b/p punctures to left arm  . Laparoscopic assisted vaginal hysterectomy  08/30/2011    Procedure: LAPAROSCOPIC ASSISTED VAGINAL HYSTERECTOMY;  Surgeon: Osborne Oman, MD;  Location: [facility];  Service: Gynecology;  Laterality: N/A;    REVIEW OF SYSTEMS:   Constitutional: Denies fevers, chills or abnormal weight loss; + headache and neck  spasms Eyes: Denies blurriness of vision Ears, nose, mouth, throat, and face: Denies mucositis or sore throat Respiratory: Denies cough, dyspnea or wheezes Cardiovascular: Denies palpitation, chest discomfort or lower extremity swelling Gastrointestinal:  Denies nausea, heartburn or change in bowel habits Skin: Denies abnormal skin rashes Lymphatics: Denies new lymphadenopathy or easy bruising Neurological:Denies numbness, tingling or new weaknesses Behavioral/Psych: Mood is stable, no new changes  All other systems were reviewed with the patient and are negative.  PHYSICAL EXAMINATION: ECOG PERFORMANCE STATUS: 0  Blood pressure 141/97, pulse 63, temperature 98.1 F (36.7 C), temperature source Oral, resp. rate 18, height 5' 3"  (1.6 m), weight 166 lb 14.4 oz (75.705 kg), last menstrual period 04/23/2011, SpO2 100 %.  GENERAL:alert, no distress and comfortable; NAD. SKIN: skin color, texture, turgor are normal, no rashes or significant lesions EYES: normal, Conjunctiva are pink and non-injected, sclera clear OROPHARYNX:no exudate, no erythema and lips, buccal mucosa, and tongue normal  NECK: supple, thyroid normal size, non-tender, without nodularity LYMPH:  no palpable lymphadenopathy in the cervical, axillary or supraclavicular BREASTS: right breast no mass, no skin changes, no nipple changes, left reconstructed breast no skin wall changes, no adenopathy on either side. LUNGS: clear to  auscultation with normal breathing effort, no wheezes or rhonchi HEART: regular rate & rhythm and no murmurs and no lower extremity edema ABDOMEN:abdomen soft, non-tender and normal bowel sounds Musculoskeletal:no cyanosis of digits and no clubbing  NEURO: alert & oriented x 3 with fluent speech, no focal motor/sensory deficits  Labs:  Lab Results  Component Value Date   WBC 7.3 11/13/2014   HGB 14.0 11/13/2014   HCT 42.7 11/13/2014   MCV 93.0 11/13/2014   PLT 263 11/13/2014   NEUTROABS 4.6  11/13/2014      Chemistry      Component Value Date/Time   NA 139 11/13/2014 1308   NA 140 03/21/2014 0942   K 3.4* 11/13/2014 1308   K 3.9 03/21/2014 0942   CL 103 03/21/2014 0942   CL 109* 02/20/2013 1044   CO2 26 11/13/2014 1308   CO2 26 03/21/2014 0942   BUN 9.9 11/13/2014 1308   BUN 10 03/21/2014 0942   CREATININE 1.0 11/13/2014 1308   CREATININE 0.82 03/21/2014 0942   CREATININE 0.66 12/17/2012 1312      Component Value Date/Time   CALCIUM 9.0 11/13/2014 1308   CALCIUM 9.4 03/21/2014 0942   ALKPHOS 104 11/13/2014 1308   ALKPHOS 64 04/17/2014 1206   AST 22 11/13/2014 1308   AST 19 04/17/2014 1206   ALT 11 11/13/2014 1308   ALT 12 04/17/2014 1206   BILITOT 0.67 11/13/2014 1308   BILITOT 0.5 04/17/2014 1206         RADIOGRAPHIC STUDIES: CT of abdomen and pelvis 04/24/2014 CT ABDOMEN AND PELVIS WITH CONTRAST  TECHNIQUE: Multidetector CT imaging of the abdomen and pelvis was performed using the standard protocol following bolus administration of intravenous contrast. CONTRAST: 72m OMNIPAQUE IOHEXOL 300 MG/ML SOLN COMPARISON: December 17 2012 FINDINGS:  There is a 1.9 x 2.4 cm mass with peripheral nodular enhancement in the posterior right lobe liver probably of liver hemangioma unchanged compared to prior CT. The liver is otherwise normal. The spleen, pancreas, gallbladder, adrenal glands and kidneys are normal. There is no hydronephrosis bilaterally. The aorta is normal without aneurysmal dilatation. There is no abdominal lymphadenopathy. There is no small bowel obstruction or diverticulitis. The appendix is normal. Partial fluid-filled bladder is normal. There is small amount of free fluid in the pelvis. Patient is status post hysterectomy. The visualized lung bases are clear. Left breast implant is noted. No focal discrete lytic or blastic lesion is identified within the visualized bones. IMPRESSION: No evidence of bowel obstruction. The appendix is normal. Probable liver  hemangioma unchanged compared to prior CT. Incidental finding of small amount of free fluid in the pelvis, nonspecific.  Right Breast Mammogram 10/04/2014 BI-RADS 1 exam.  ASSESSMENT: YLeana Roe89y.o. female with a history of Malignant neoplasm of female breast, left - Plan: CBC & Diff and Retic, Comprehensive metabolic panel (Cmet) - [facility], Ferritin, Iron and TIBC [facility]   PLAN:   1. Left breast invasive duct carcinoma (ER+,PR+,HER2 negative) , [id number], stage Ia - She is tolerating the adjuvant tamoxifen well. I discussed the ATLAS and ATTOM studies recommending 10 years of Tamoxifen now, she agrees. -I discussed using SSRI to reduce her hot flash, she said does not want take additional medication for now, and states her hot flashes is tolerable. -She had a hysterectomy, no risk of tamoxifen-related endometrial cancer. -She will do annual mammogram. Last one was negative or months ago.  2. Rectal bleeding -She was referred to GI for intermittent rectal bleeding, she did  not go due to her lack of insurance at that time. She now has insurance, and is can call the GI clinic to reschedule an appointment. I encouraged her to consider colonoscopy.  Plan -RTC in 4 month with lab -follow up with GI   I spent 20 minutes counseling the patient face to face. The total time spent in the appointment was 25 minutes.    Truitt Merle  11/13/2014

## 2015-02-05 ENCOUNTER — Ambulatory Visit: Payer: Self-pay

## 2015-02-05 ENCOUNTER — Other Ambulatory Visit: Payer: Self-pay

## 2015-02-17 ENCOUNTER — Telehealth: Payer: Self-pay | Admitting: *Deleted

## 2015-02-17 DIAGNOSIS — C50919 Malignant neoplasm of unspecified site of unspecified female breast: Secondary | ICD-10-CM

## 2015-02-17 MED ORDER — TAMOXIFEN CITRATE 20 MG PO TABS: 20.0000 mg | ORAL_TABLET | Freq: Every day | ORAL | Status: DC

## 2015-02-17 NOTE — Telephone Encounter (Signed)
Received vm call from pt requesting refill on her tamoxifen t [facility] on [address]  This will be done.

## 2015-03-13 ENCOUNTER — Ambulatory Visit: Payer: 59 | Admitting: Psychology

## 2015-03-13 ENCOUNTER — Telehealth: Payer: Self-pay | Admitting: Psychology

## 2015-03-13 NOTE — Telephone Encounter (Signed)
Patient left a VM at 8:17 this morning stating that she was going to ask Dr. Lamar Benes for a referral elsewhere since our Behavioral Medicine has shifted from a traditional model to a more integrated model of care.  At first, she thought this would work for her but something must have changed.  I called her back but she was in a meeting and asked if she could call me later.

## 2015-03-13 NOTE — Telephone Encounter (Signed)
Victoria Delacruz called back.  She is seeing Dr. Harlan Stains now that she is under her husband's insurance.  Dr. Dema Severin has encouraged her to find a therapist she can see regularly.  I gave her two possibilities:  [name] and [facility].  Advised to call back if she runs in to any difficulty.

## 2015-03-14 ENCOUNTER — Ambulatory Visit (HOSPITAL_BASED_OUTPATIENT_CLINIC_OR_DEPARTMENT_OTHER): Payer: 59 | Admitting: Hematology

## 2015-03-14 ENCOUNTER — Encounter: Payer: Self-pay | Admitting: Hematology

## 2015-03-14 ENCOUNTER — Other Ambulatory Visit (HOSPITAL_BASED_OUTPATIENT_CLINIC_OR_DEPARTMENT_OTHER): Payer: 59

## 2015-03-14 ENCOUNTER — Telehealth: Payer: Self-pay | Admitting: Hematology

## 2015-03-14 VITALS — BP 138/88 | HR 65 | Temp 98.3°F | Resp 20 | Ht 63.0 in | Wt 160.7 lb

## 2015-03-14 DIAGNOSIS — R5383 Other fatigue: Secondary | ICD-10-CM | POA: Diagnosis not present

## 2015-03-14 DIAGNOSIS — Z170 Estrogen receptor positive status [ER+]: Secondary | ICD-10-CM | POA: Diagnosis not present

## 2015-03-14 DIAGNOSIS — C50912 Malignant neoplasm of unspecified site of left female breast: Secondary | ICD-10-CM

## 2015-03-14 DIAGNOSIS — I10 Essential (primary) hypertension: Secondary | ICD-10-CM | POA: Diagnosis not present

## 2015-03-14 DIAGNOSIS — D649 Anemia, unspecified: Secondary | ICD-10-CM

## 2015-03-14 LAB — COMPREHENSIVE METABOLIC PANEL (CC13)
ALT: 13 U/L (ref 0–55)
AST: 22 U/L (ref 5–34)
Albumin: 3.7 g/dL (ref 3.5–5.0)
Alkaline Phosphatase: 82 U/L (ref 40–150)
Anion Gap: 8 mEq/L (ref 3–11)
BUN: 8.5 mg/dL (ref 7.0–26.0)
CO2: 25 mEq/L (ref 22–29)
Calcium: 8.8 mg/dL (ref 8.4–10.4)
Chloride: 108 mEq/L (ref 98–109)
Creatinine: 0.9 mg/dL (ref 0.6–1.1)
EGFR: >90 mL/min/{1.73_m2} (ref >90)
Glucose: 94 mg/dl (ref 70–140)
Potassium: 3.6 mEq/L (ref 3.5–5.1)
Sodium: 140 mEq/L (ref 136–145)
Total Bilirubin: 0.34 mg/dL (ref 0.20–1.20)
Total Protein: 7.5 g/dL (ref 6.4–8.3)

## 2015-03-14 LAB — CBC & DIFF AND RETIC
BASO%: 0.4 % (ref 0.0–2.0)
Basophils Absolute: 0 10*3/uL (ref 0.0–0.1)
EOS%: 1.8 % (ref 0.0–7.0)
Eosinophils Absolute: 0.1 10*3/uL (ref 0.0–0.5)
HCT: 37.9 % (ref 34.8–46.6)
HGB: 13 g/dL (ref 11.6–15.9)
Immature Retic Fract: 4.4 % (ref 1.60–10.00)
LYMPH%: 31.5 % (ref 14.0–49.7)
MCH: 31.4 pg (ref 25.1–34.0)
MCHC: 34.3 g/dL (ref 31.5–36.0)
MCV: 91.5 fL (ref 79.5–101.0)
MONO#: 0.4 10*3/uL (ref 0.1–0.9)
MONO%: 5.1 % (ref 0.0–14.0)
NEUT#: 4.5 10*3/uL (ref 1.5–6.5)
NEUT%: 61.2 % (ref 38.4–76.8)
Platelets: 239 10*3/uL (ref 145–400)
RBC: 4.14 10*6/uL (ref 3.70–5.45)
RDW: 13 % (ref 11.2–14.5)
Retic %: 1.17 % (ref 0.70–2.10)
Retic Ct Abs: 48.44 10*3/uL (ref 33.70–90.70)
WBC: 7.3 10*3/uL (ref 3.9–10.3)
lymph#: 2.3 10*3/uL (ref 0.9–3.3)

## 2015-03-14 LAB — IRON AND TIBC CHCC
%SAT: 22 % (ref 21–57)
Iron: 62 ug/dL (ref 41–142)
TIBC: 280 ug/dL (ref 236–444)
UIBC: 218 ug/dL (ref 120–384)

## 2015-03-14 LAB — FERRITIN CHCC: Ferritin: 130 ng/ml (ref 9–269)

## 2015-03-14 NOTE — Progress Notes (Signed)
[facility] OFFICE PROGRESS NOTE  [name], MD [place] [address]  DIAGNOSIS: Malignant neoplasm of female breast, left  CHIEF COMPLAIN: follow up breast cancer     Malignant neoplasm of female breast   08/02/2003 Initial Diagnosis MALIGNANT NEOPLASM OF BREAST UNSPECIFIED SITE. High-grade DCIS of Left breast; Tumor was 4.0 cm. Sentinel lymph nodes were negative.  ER, 24%; PR 2%.  Patient had saline implant.    08/02/2003 Surgery L mastectomy with reconstruction [name]   05/07/2010 Surgery R port-a-cath placement   05/11/2010 Relapse/Recurrence Invasive ductal carcinoma of L breast; ER: 99%, positive; PR: 53%, positive; Ki 67 (mib-1): 20%; hER@ neu by FISH without ampliication; ratio of her 2 CEP 17 was 1.17.    05/28/2010 Surgery Local excision of the L invasive ductal carcinoma by [name].   06/08/2010 - 08/10/2010 Chemotherapy Completed 4 cycles of adjuvant chemotherapy with cytoxan and taxotere in combination with neulasta    09/08/2010 - 11/04/2010 Radiation Therapy XRT was given to the left breast and chall wall consisting of 4780 in 26 fractions with an 1800 cGy boost in 9 fractions Dr Valere Dross.   11/04/2010 - 04/17/2011 Chemotherapy Tamoxifen taken sporadically and discontinued due to vaginal bleeding.    08/30/2011 Surgery Laparoscopic assisted vaginal hysterectomy.   04/10/2012 Imaging MRI of the brain normal.  No cause of the patient's headache identified.    12/17/2012 Imaging CT scan of abdomen and pelvis showed no acute findings in the abdomen or pelvis.  There were felt to be hepatic hemangiomas present, mesuring 2.5 cm in the superior right heaptic lobe at the hepatic dome and a 7 mm lesion located anteriorly.    08/23/2013 Imaging Digital diagnostic unilateral right mammogram showed no mamographic or sonographic evidence of malignancy, right breast.      09/11/2013 -  Chemotherapy Tamoxifen resumed.  to complete 5 years.     CURRENT THERAPY: Tamoxifen started in 11/2009, held 04/2011-08/2013, and restarted on 09/11/2013   INTERIM HISTORY: She returns for follow up. She has been compliant with Tamoxifen daily. She is clinically doing well overall, does have multiple chronic symptoms, including diffuse body aches from fibromyalgia, intermittent left abdominal pain, mild fatigue, which has not changed lately. She denies any nausea, change of her bowel habits, chest pain, dyspnea, or other new symptoms. No recent weight loss. Hot flashes mild and tolerable.  MEDICAL HISTORY: Past Medical History  Diagnosis Date  . ECTOPIC PREGNANCY 1997 and 2011    x 2  . Hypertension   . Fibromyalgia   . Fibroid   . Breast cancer 2004 and 2011  . Anemia   . Arthritis   . Dizziness   . Family history of breast cancer     grandmother  . Blood transfusion 05/17/11, 05/25/11    anemia  . Depression     ALLERGIES:  has No Known Allergies.  MEDICATIONS: has a current medication list which includes the following prescription(s): amlodipine, aspirin-acetaminophen-caffeine, cyclobenzaprine, duloxetine, gabapentin, polyethylene glycol powder, potassium chloride sa, propranolol, tamoxifen, and clonazepam.  SURGICAL HISTORY:  Past Surgical History  Procedure Laterality Date  . Laparoscopy for ectopic pregnancy    . Laparoscopy w/ mini-laparotomy    . Reconstruction breast immediate / delayed w/ tissue expander  2004  . Mastectomy  2004    complete with reconstruction x 3, no b/p punctures to left arm  . Laparoscopic assisted vaginal hysterectomy  08/30/2011    Procedure: LAPAROSCOPIC ASSISTED VAGINAL HYSTERECTOMY;  Surgeon: Laray Anger  A Anyanwu, MD;  Location: [facility];  Service: Gynecology;  Laterality: N/A;    REVIEW OF SYSTEMS:   Constitutional: Denies fevers, chills or abnormal weight loss; + headache and neck spasms Eyes: Denies blurriness of vision Ears, nose, mouth, throat, and face: Denies mucositis or sore  throat Respiratory: Denies cough, dyspnea or wheezes Cardiovascular: Denies palpitation, chest discomfort or lower extremity swelling Gastrointestinal:  Denies nausea, heartburn or change in bowel habits Skin: Denies abnormal skin rashes Lymphatics: Denies new lymphadenopathy or easy bruising Neurological:Denies numbness, tingling or new weaknesses Behavioral/Psych: Mood is stable, no new changes  All other systems were reviewed with the patient and are negative.  PHYSICAL EXAMINATION: ECOG PERFORMANCE STATUS: 0  Blood pressure 138/88, pulse 65, temperature 98.3 F (36.8 C), temperature source Oral, resp. rate 20, height _0  (1.6 m), weight 160 lb 11.2 oz (72.893 kg), last menstrual period 04/23/2011, SpO2 99 %.  GENERAL:alert, no distress and comfortable; NAD. SKIN: skin color, texture, turgor are normal, no rashes or significant lesions EYES: normal, Conjunctiva are pink and non-injected, sclera clear OROPHARYNX:no exudate, no erythema and lips, buccal mucosa, and tongue normal  NECK: supple, thyroid normal size, non-tender, without nodularity LYMPH:  no palpable lymphadenopathy in the cervical, axillary or supraclavicular BREASTS: right breast no mass, no skin changes, no nipple changes, left reconstructed breast no skin wall changes, no adenopathy on either side. LUNGS: clear to auscultation with normal breathing effort, no wheezes or rhonchi HEART: regular rate & rhythm and no murmurs and no lower extremity edema ABDOMEN:abdomen soft, non-tender and normal bowel sounds Musculoskeletal:no cyanosis of digits and no clubbing  NEURO: alert & oriented x 3 with fluent speech, no focal motor/sensory deficits  Labs:  CBC Latest Ref Rng 03/14/2015 11/13/2014 08/06/2014  WBC 3.9 - 10.3 10e3/uL 7.3 7.3 6.6  Hemoglobin 11.6 - 15.9 g/dL 13.0 14.0 13.0  Hematocrit 34.8 - 46.6 % 37.9 42.7 38.0  Platelets 145 - 400 10e3/uL 239 263 237    CMP Latest Ref Rng 03/14/2015 11/13/2014 08/06/2014   Glucose 70 - 140 mg/dl 94 122 97  BUN 7.0 - 26.0 mg/dL 8.5 9.9 11.9  Creatinine 0.6 - 1.1 mg/dL 0.9 1.0 0.8  Sodium 136 - 145 mEq/L 140 139 140  Potassium 3.5 - 5.1 mEq/L 3.6 3.4(L) 3.4(L)  Chloride 96 - 112 mEq/L - - -  CO2 22 - 29 mEq/L _1 Calcium 8.4 - 10.4 mg/dL 8.8 9.0 9.4  Total Protein 6.4 - 8.3 g/dL 7.5 7.9 -  Total Bilirubin 0.20 - 1.20 mg/dL 0.34 0.67 -  Alkaline Phos 40 - 150 U/L 82 104 -  AST 5 - 34 U/L 22 22 -  ALT 0 - 55 U/L 13 11 -      RADIOGRAPHIC STUDIES: CT of abdomen and pelvis 04/24/2014 CT ABDOMEN AND PELVIS WITH CONTRAST  TECHNIQUE: Multidetector CT imaging of the abdomen and pelvis was performed using the standard protocol following bolus administration of intravenous contrast. CONTRAST: 81m OMNIPAQUE IOHEXOL 300 MG/ML SOLN COMPARISON: December 17 2012 FINDINGS:  There is a 1.9 x 2.4 cm mass with peripheral nodular enhancement in the posterior right lobe liver probably of liver hemangioma unchanged compared to prior CT. The liver is otherwise normal. The spleen, pancreas, gallbladder, adrenal glands and kidneys are normal. There is no hydronephrosis bilaterally. The aorta is normal without aneurysmal dilatation. There is no abdominal lymphadenopathy. There is no small bowel obstruction or diverticulitis. The appendix is normal. Partial fluid-filled bladder is normal. There is  small amount of free fluid in the pelvis. Patient is status post hysterectomy. The visualized lung bases are clear. Left breast implant is noted. No focal discrete lytic or blastic lesion is identified within the visualized bones. IMPRESSION: No evidence of bowel obstruction. The appendix is normal. Probable liver hemangioma unchanged compared to prior CT. Incidental finding of small amount of free fluid in the pelvis, nonspecific.  Right Breast Mammogram 10/04/2014 BI-RADS 1 exam.  ASSESSMENT: Victoria Delacruz 44 y.o. female with a history of Malignant neoplasm of female breast,  left   PLAN:   1. Left breast invasive duct carcinoma (ER+,PR+,HER2 negative) , pT1aN0M0, stage Ia - She is tolerating the adjuvant tamoxifen well. I discussed the ATLAS and ATTOM studies recommending 10 years of Tamoxifen now, she agrees. -I discussed using SSRI to reduce her hot flash, she said does not want take additional medication for now, and states her hot flashes is tolerable. -She had a hysterectomy, no risk of tamoxifen-related endometrial cancer. -She will do annual mammogram. Last one was negative. Next due in December 2016 with bilateral mammogram -His chronic the body pain and abdominal pain are unlikely related to her breast cancer, she has a unremarkable abdominal CT scan in July 2015. I do not feel she needs a repeat scan. -I encouraged him to eat healthy and exercise regularly.  2. Genetics  -She is young and had recurrent breast cancer. She states that she had genetic test after she was diagnosed with breast cancer first time, -I'll check with our genetic or counselor to see if she needs more extensive genetic test.  3. HTN, Fibromyalgia -She follows up with her primary care physician.   Plan -RTC in 6 month with lab -Possible genetic referral  I spent 20 minutes counseling the patient face to face. The total time spent in the appointment was 25 minutes.    Truitt Merle  03/14/2015

## 2015-03-14 NOTE — Telephone Encounter (Signed)
Gave and printed appt sched and avs fo rpt; for NOV  °

## 2015-03-14 NOTE — Addendum Note (Signed)
Addended by: Truitt Merle on: 03/14/2015 01:01 PM   Modules accepted: Orders

## 2015-04-02 ENCOUNTER — Telehealth: Payer: Self-pay | Admitting: Genetic Counselor

## 2015-04-02 NOTE — Telephone Encounter (Signed)
left message regarding new pt gen counseling appt.

## 2015-04-08 ENCOUNTER — Telehealth: Payer: Self-pay | Admitting: Hematology

## 2015-04-08 NOTE — Telephone Encounter (Signed)
Called pt and scheduled new pt gen counseling appt  Roma Kayser 05/19/15 1 pm

## 2015-04-25 ENCOUNTER — Encounter: Payer: Self-pay | Admitting: Genetic Counselor

## 2015-05-02 ENCOUNTER — Ambulatory Visit (INDEPENDENT_AMBULATORY_CARE_PROVIDER_SITE_OTHER): Payer: Commercial Managed Care - HMO | Admitting: Neurology

## 2015-05-02 ENCOUNTER — Encounter: Payer: Self-pay | Admitting: Neurology

## 2015-05-02 VITALS — BP 140/78 | HR 70 | Resp 20 | Ht 63.0 in | Wt 162.6 lb

## 2015-05-02 DIAGNOSIS — F411 Generalized anxiety disorder: Secondary | ICD-10-CM | POA: Diagnosis not present

## 2015-05-02 DIAGNOSIS — F329 Major depressive disorder, single episode, unspecified: Secondary | ICD-10-CM

## 2015-05-02 DIAGNOSIS — M797 Fibromyalgia: Secondary | ICD-10-CM | POA: Diagnosis not present

## 2015-05-02 DIAGNOSIS — R0689 Other abnormalities of breathing: Secondary | ICD-10-CM

## 2015-05-02 DIAGNOSIS — F32A Depression, unspecified: Secondary | ICD-10-CM

## 2015-05-02 NOTE — Progress Notes (Signed)
NEUROLOGY CONSULTATION NOTE  [name] MRN: [id number] DOB: Mar 08, 1971  Referring provider: Dr. Dema Severin Primary care provider: Dr. Dema Severin  Reason for consult:  Gasping breath spells, rule out tic  HISTORY OF PRESENT ILLNESS: Victoria Delacruz is a 44 year old left-handed woman with hypertension, fibromyalgia, and history of left breast invasive duct carcinoma on Tamoxifen, who presents for possible tics.  Records and labs reviewed.  She is a two-time breast cancer survivor.  She was last treated with chemotherapy and radiation in 2011.  Since around that time, she began experiencing spells where she suddenly will gasp for breath.  Often, there is no preceding sensation.  It occurs now frequently throughout the day.  At one point, it seemed to have gotten better but later started picking up again.  She says stress and can trigger or exacerbate it.  One time, it was occuring so often, that she developed chest pain.  Sometimes, she says it wakes her out of sleep.  She does report anxiety, depression and panic attacks related to her health.  Her aunt told her she had the same thing but it resolved.  However, her aunt had anxiety regarding her own health as well.  She denies any abnormal movements of her extremities.  She has fibromyalgia.  She has no childhood history of similar events.  Last brain imaging available is an MRI of the brain with and without contrast from 04/10/12, which was normal.    She takes propranolol 20mg  twice daily, gabapentin 300mg  at bedtime, Cymbalta 60mg , ASA 81mg , Norvasc and Tamoxifen.  Labs, including TSH, were unremarkable.  PAST MEDICAL HISTORY: Past Medical History  Diagnosis Date  . ECTOPIC PREGNANCY 1997 and 2011    x 2  . Hypertension   . Fibromyalgia   . Fibroid   . Breast cancer 2004 and 2011  . Anemia   . Arthritis   . Dizziness   . Family history of breast cancer     grandmother  . Blood transfusion 05/17/11, 05/25/11    anemia  .  Depression     PAST SURGICAL HISTORY: Past Surgical History  Procedure Laterality Date  . Laparoscopy for ectopic pregnancy    . Laparoscopy w/ mini-laparotomy    . Reconstruction breast immediate / delayed w/ tissue expander  2004  . Mastectomy  2004    complete with reconstruction x 3, no b/p punctures to left arm  . Laparoscopic assisted vaginal hysterectomy  08/30/2011    Procedure: LAPAROSCOPIC ASSISTED VAGINAL HYSTERECTOMY;  Surgeon: Osborne Oman, MD;  Location: [facility];  Service: Gynecology;  Laterality: N/A;    MEDICATIONS: Current Outpatient Prescriptions on File Prior to Visit  Medication Sig Dispense Refill  . amLODipine (NORVASC) 5 MG tablet Take 1 tablet (5 mg total) by mouth daily. 90 tablet 3  . aspirin-acetaminophen-caffeine (EXCEDRIN MIGRAINE) 761-950-93 MG per tablet Take 1 tablet by mouth every 6 (six) hours as needed for headache.    . clonazePAM (KLONOPIN) 0.5 MG tablet Take 0.5 mg by mouth 2 (two) times daily as needed.  0  . cyclobenzaprine (FLEXERIL) 10 MG tablet Take 1 tablet (10 mg total) by mouth at bedtime. 20 tablet 0  . DULoxetine (CYMBALTA) 30 MG capsule Take 60 mg by mouth at bedtime.    . gabapentin (NEURONTIN) 400 MG capsule Take 1 capsule (400 mg total) by mouth at bedtime. (Patient taking differently: Take 300 mg by mouth at bedtime. ) 30 capsule 5  . polyethylene glycol powder (GLYCOLAX/MIRALAX) powder  Take 17 g by mouth daily. (Patient taking differently: Take 17 g by mouth as needed. ) 3350 g 1  . propranolol (INDERAL) 40 MG tablet Take 0.5 tablets (20 mg total) by mouth 2 (two) times daily. 90 tablet 3  . tamoxifen (NOLVADEX) 20 MG tablet Take 1 tablet (20 mg total) by mouth daily. 30 tablet 11  . potassium chloride SA (K-DUR,KLOR-CON) 20 MEQ tablet Take 1 tablet (20 mEq total) by mouth daily. (Patient not taking: Reported on 05/02/2015) 10 tablet 0   No current facility-administered medications on file prior to visit.    ALLERGIES: No Known  Allergies  FAMILY HISTORY: Family History  Problem Relation Age of Onset  . Hypertension Mother   . Hypertension Father     History of blood clots in lungs  . Lymphoma Sister     Hodgkins  . Cancer Sister   . Cancer Maternal Aunt     breast   . Cancer Maternal Uncle     prostate/ bone   . Cancer Maternal Grandmother     breast  . Cancer Maternal Grandfather     lung    SOCIAL HISTORY: History   Social History  . Marital Status: Single    Spouse Name: N/A  . Number of Children: N/A  . Years of Education: N/A   Occupational History  . Not on file.   Social History Main Topics  . Smoking status: Never Smoker   . Smokeless tobacco: Never Used  . Alcohol Use: No  . Drug Use: No  . Sexual Activity:    Partners: Male    Birth Control/ Protection: None   Other Topics Concern  . Not on file   Social History Narrative    REVIEW OF SYSTEMS: Constitutional: No fevers, chills, or sweats, no generalized fatigue, change in appetite Eyes: No visual changes, double vision, eye pain Ear, nose and throat: No hearing loss, ear pain, nasal congestion, sore throat Cardiovascular: No chest pain, palpitations Respiratory:  No shortness of breath at rest or with exertion, wheezes GastrointestinaI: No nausea, vomiting, diarrhea, abdominal pain, fecal incontinence Genitourinary:  No dysuria, urinary retention or frequency Musculoskeletal:  No neck pain, back pain Integumentary: No rash, pruritus, skin lesions Neurological: as above Psychiatric: No depression, insomnia, anxiety Endocrine: No palpitations, fatigue, diaphoresis, mood swings, change in appetite, change in weight, increased thirst Hematologic/Lymphatic:  No anemia, purpura, petechiae. Allergic/Immunologic: no itchy/runny eyes, nasal congestion, recent allergic reactions, rashes  PHYSICAL EXAM: Filed Vitals:   05/02/15 1240  BP: 140/78  Pulse: 70  Resp: 20   General: No acute distress Head:   Normocephalic/atraumatic Eyes:  fundi unremarkable, without vessel changes, exudates, hemorrhages or papilledema. Neck: supple, no paraspinal tenderness, full range of motion Back: No paraspinal tenderness Heart: regular rate and rhythm Lungs: Clear to auscultation bilaterally. Vascular: No carotid bruits. Neurological Exam: Mental status: alert and oriented to person, place, and time, recent and remote memory intact, fund of knowledge intact, attention and concentration intact, speech fluent and not dysarthric, language intact. Cranial nerves: CN I: not tested CN II: pupils equal, round and reactive to light, visual fields intact, fundi unremarkable, without vessel changes, exudates, hemorrhages or papilledema. CN III, IV, VI:  full range of motion, no nystagmus, no ptosis CN V: facial sensation intact CN VII: upper and lower face symmetric CN VIII: hearing intact CN IX, X: gag intact, uvula midline CN XI: sternocleidomastoid and trapezius muscles intact CN XII: tongue midline Bulk & Tone: normal, no fasciculations. Motor:  5/5 throughout Sensation:  Temperature and vibration intact Deep Tendon Reflexes:  2+ throughout, toes downgoing Finger to nose testing:  intact Heel to shin:  intact Gait:  Normal station and stride.  Able to turn and walk in tandem. Romberg negative.  IMPRESSION: Episodes of gasping for breath.  I suspect it is related to anxiety.  She has had these spells for 5 years and her neurologic exam is unremarkable.  It is atypical presentation for a tic disorder and typical age of onset would be much younger.  PLAN: No further neurologic workup indicated Recommend care via psychiatrist. Blood pressure mildly elevated.  Recommend recheck by PCP.   Thank you for allowing me to take part in the care of this patient.  Metta Clines, DO  CC: Harlan Stains, MD

## 2015-05-02 NOTE — Patient Instructions (Signed)
Your exam is normal.  I think the gasping spells are probably related to anxiety.

## 2015-05-19 ENCOUNTER — Encounter: Payer: 59 | Admitting: Genetic Counselor

## 2015-05-19 ENCOUNTER — Other Ambulatory Visit: Payer: 59

## 2015-05-20 ENCOUNTER — Encounter: Payer: Self-pay | Admitting: Internal Medicine

## 2015-05-20 ENCOUNTER — Ambulatory Visit (INDEPENDENT_AMBULATORY_CARE_PROVIDER_SITE_OTHER): Payer: Commercial Managed Care - HMO | Admitting: Internal Medicine

## 2015-05-20 VITALS — BP 138/72 | HR 77 | Ht 64.0 in | Wt 167.8 lb

## 2015-05-20 DIAGNOSIS — R0602 Shortness of breath: Secondary | ICD-10-CM | POA: Diagnosis not present

## 2015-05-20 DIAGNOSIS — I10 Essential (primary) hypertension: Secondary | ICD-10-CM

## 2015-05-20 MED ORDER — BISOPROLOL FUMARATE 5 MG PO TABS: 5.0000 mg | ORAL_TABLET | Freq: Every day | ORAL | Status: DC

## 2015-05-20 MED ORDER — PANTOPRAZOLE SODIUM 40 MG PO TBEC: 40.0000 mg | DELAYED_RELEASE_TABLET | Freq: Every day | ORAL | Status: DC

## 2015-05-20 MED ORDER — FAMOTIDINE 20 MG PO TABS: ORAL_TABLET | ORAL | Status: DC

## 2015-05-20 NOTE — Patient Instructions (Addendum)
Bisoprolol 5 mg take one half daily in place of inderal(propranolol)   Pantoprazole (protonix) 40 mg   Take  30-60 min before first meal of the day and Pepcid (famotidine)  20 mg one @  bedtime until return to office - this is the best way to tell whether stomach acid is contributing to your problem.    GERD (REFLUX)  is an extremely common cause of respiratory symptoms just like yours , many times with no obvious heartburn at all.    It can be treated with medication, but also with lifestyle changes including elevation of the head of your bed (ideally with 6 inch  bed blocks),  Smoking cessation, avoidance of late meals, excessive alcohol, and avoid fatty foods, chocolate, peppermint, colas, red wine, and acidic juices such as orange juice.  NO MINT OR MENTHOL PRODUCTS SO NO COUGH DROPS  USE SUGARLESS CANDY INSTEAD (Jolley ranchers or Stover's or Life Savers) or even ice chips will also do - the key is to swallow to prevent all throat clearing. NO OIL BASED VITAMINS - use powdered substitutes.    If not improving in 2 weeks call us for referral to Dr Joya Gaskins at [facility] voice center

## 2015-05-20 NOTE — Progress Notes (Signed)
Subjective:     Patient ID: Victoria Delacruz, female   DOB: 02/11/1971,    MRN: [id number]  HPI  16 yobf  Never smoker onset of sob at rest > ex x 2011 improved ? Spontaneously but came back  since winter of 2015 referred by Dr Dema Severin to pulmonary clinic 05/20/2015 with dx ? VCD   05/20/2015 1st [facility] office visit/ Seda Kronberg   Chief Complaint  Patient presents with  . Pulmonary Consult    Referred by Dr. Harlan Stains. Pt c/o "loss of breath for a long time". She states has to gasp for air.  sporadic and even waking her up now occ sense of "whooping" breathing, just one breath then resolves  Note on On inderal but no effect on activity tolerance unless actively "whooping" - does not occur eating/ swallowing/ not better on benzo's to date    No obvious day to day or daytime variability or assoc chronic cough or cp or chest tightness, subjective wheeze or overt sinus or hb symptoms. No unusual exp hx or h/o childhood pna/ asthma or knowledge of premature birth.  Sleeping ok without nocturnal  or early am exacerbation  of respiratory  c/o's or need for noct saba. Also denies any obvious fluctuation of symptoms with weather or environmental changes or other aggravating or alleviating factors except as outlined above   Current Medications, Allergies, Complete Past Medical History, Past Surgical History, Family History, and Social History were reviewed in [employer] record.  ROS  The following are not active complaints unless bolded sore throat, dysphagia, dental problems, itching, sneezing,  nasal congestion or excess/ purulent secretions, ear ache,   fever, chills, sweats, unintended wt loss, classically pleuritic or exertional cp, hemoptysis,  orthopnea pnd or leg swelling, presyncope, palpitations, abdominal pain, anorexia, nausea, vomiting, diarrhea  or change in bowel or bladder habits, change in stools or urine, dysuria,hematuria,  rash, arthralgias, visual  complaints, headache, numbness, weakness or ataxia or problems with walking or coordination,  change in mood/affect or memory.             Review of Systems     Objective:   Physical Exam Wt Readings from Last 3 Encounters:  05/20/15 167 lb 12.8 oz (76.114 kg)  05/02/15 162 lb 9.6 oz (73.755 kg)  03/14/15 160 lb 11.2 oz (72.893 kg)    Vital signs reviewed   Anxious amb bf with paroysmal "whooping" x one breath in the middle of a sentence but good phonation otherwise   HEENT: nl dentition, turbinates, and orophanx. Nl external ear canals without cough reflex   NECK :  without JVD/Nodes/TM/ nl carotid upstrokes bilaterally   LUNGS: no acc muscle use, clear to A and P bilaterally without cough on insp or exp maneuvers   CV:  RRR  no s3 or murmur or increase in P2, no edema   ABD:  soft and nontender with nl excursion in the supine position. No bruits or organomegaly, bowel sounds nl  MS:  warm without deformities, calf tenderness, cyanosis or clubbing  SKIN: warm and dry without lesions    NEURO:  alert, approp, no deficits         Assessment:

## 2015-05-21 NOTE — Progress Notes (Signed)
Quick Note:  Spoke with pt and notified of results per Dr. Wert. Pt verbalized understanding and denied any questions.  ______ 

## 2015-05-22 ENCOUNTER — Encounter: Payer: Self-pay | Admitting: Internal Medicine

## 2015-05-22 NOTE — Assessment & Plan Note (Addendum)
-   7/19 /2016  Walked RA x 3 laps @ 185 ft each stopped due to  End of study, nl pace, no sob or desat  / no whooping  - Spirometry 05/20/15  wnl though did not do insp loop   Symptoms are markedly disproportionate to objective findings and not clear this is a lung problem but pt does appear to have difficult airway management issues. DDX of  difficult airways management all start with A and  include Adherence, Ace Inhibitors, Acid Reflux, Active Sinus Disease, Alpha 1 Antitripsin deficiency, Anxiety masquerading as Airways dz,  ABPA,  allergy(esp in young), Aspiration (esp in elderly), Adverse effects of meds,  Active smokers, A bunch of PE's (a small clot burden can't cause this syndrome unless there is already severe underlying pulm or vascular dz with poor reserve) plus two Bs  = Bronchiectasis and Beta blocker use..and one C= CHF   Adherence is always the initial "prime suspect" and is a multilayered concern that requires a "trust but verify" approach in every patient - starting with knowing how to use medications, especially inhalers, correctly, keeping up with refills and understanding the fundamental difference between maintenance and prns vs those medications only taken for a very short course and then stopped and not refilled.   ? Acid (or non-acid) GERD > always difficult to exclude as up to 75% of pts in some series report no assoc GI/ Heartburn symptoms> rec max (24h)  acid suppression and diet restrictions/ reviewed and instructions given in writing.   ? BB effect > doubt but see hbp  Anxiety / vcd most likely dx here and probably needs to see Dr Joya Gaskins at voice center/ [place] but try max gerd rx/ new BB first   Total time = 60m review case with pt/ discussion/ counseling/ giving and going over instructions (see avs)

## 2015-05-22 NOTE — Assessment & Plan Note (Addendum)
Strongly prefer in this setting: Bystolic, the most beta -1  selective Beta blocker available in sample form, with bisoprolol the most selective generic choice  on the market.   Try bisoprolol  5 mg take one half daily.

## 2015-09-05 ENCOUNTER — Telehealth: Payer: Self-pay | Admitting: *Deleted

## 2015-09-05 ENCOUNTER — Telehealth: Payer: Self-pay | Admitting: Hematology

## 2015-09-05 ENCOUNTER — Encounter: Payer: Self-pay | Admitting: Hematology

## 2015-09-05 ENCOUNTER — Ambulatory Visit (HOSPITAL_BASED_OUTPATIENT_CLINIC_OR_DEPARTMENT_OTHER): Payer: Commercial Managed Care - HMO | Admitting: Hematology

## 2015-09-05 ENCOUNTER — Other Ambulatory Visit (HOSPITAL_BASED_OUTPATIENT_CLINIC_OR_DEPARTMENT_OTHER): Payer: Commercial Managed Care - HMO

## 2015-09-05 VITALS — BP 119/77 | HR 60 | Temp 98.7°F | Resp 18 | Ht 64.0 in | Wt 169.3 lb

## 2015-09-05 DIAGNOSIS — D649 Anemia, unspecified: Secondary | ICD-10-CM | POA: Diagnosis not present

## 2015-09-05 DIAGNOSIS — Z79810 Long term (current) use of selective estrogen receptor modulators (SERMs): Secondary | ICD-10-CM | POA: Diagnosis not present

## 2015-09-05 DIAGNOSIS — C50912 Malignant neoplasm of unspecified site of left female breast: Secondary | ICD-10-CM

## 2015-09-05 DIAGNOSIS — C50919 Malignant neoplasm of unspecified site of unspecified female breast: Secondary | ICD-10-CM

## 2015-09-05 DIAGNOSIS — C50412 Malignant neoplasm of upper-outer quadrant of left female breast: Secondary | ICD-10-CM

## 2015-09-05 DIAGNOSIS — I10 Essential (primary) hypertension: Secondary | ICD-10-CM | POA: Diagnosis not present

## 2015-09-05 DIAGNOSIS — Z170 Estrogen receptor positive status [ER+]: Secondary | ICD-10-CM

## 2015-09-05 LAB — COMPREHENSIVE METABOLIC PANEL (CC13)
ALT: 13 U/L (ref 0–55)
AST: 20 U/L (ref 5–34)
Albumin: 3.7 g/dL (ref 3.5–5.0)
Alkaline Phosphatase: 89 U/L (ref 40–150)
Anion Gap: 8 mEq/L (ref 3–11)
BUN: 13.2 mg/dL (ref 7.0–26.0)
CO2: 25 mEq/L (ref 22–29)
Calcium: 9.2 mg/dL (ref 8.4–10.4)
Chloride: 109 mEq/L (ref 98–109)
Creatinine: 0.8 mg/dL (ref 0.6–1.1)
EGFR: >90 mL/min/{1.73_m2} (ref >90)
Glucose: 104 mg/dl (ref 70–140)
Potassium: 3.3 mEq/L — ABNORMAL LOW (ref 3.5–5.1)
Sodium: 142 mEq/L (ref 136–145)
Total Bilirubin: 0.53 mg/dL (ref 0.20–1.20)
Total Protein: 7.4 g/dL (ref 6.4–8.3)

## 2015-09-05 LAB — IRON AND TIBC CHCC
%SAT: 35 % (ref 21–57)
Iron: 101 ug/dL (ref 41–142)
TIBC: 288 ug/dL (ref 236–444)
UIBC: 186 ug/dL (ref 120–384)

## 2015-09-05 LAB — CBC & DIFF AND RETIC
BASO%: 0.8 % (ref 0.0–2.0)
Basophils Absolute: 0.1 10*3/uL (ref 0.0–0.1)
EOS%: 1.4 % (ref 0.0–7.0)
Eosinophils Absolute: 0.1 10*3/uL (ref 0.0–0.5)
HCT: 38.8 % (ref 34.8–46.6)
HGB: 13.1 g/dL (ref 11.6–15.9)
Immature Retic Fract: 5.1 % (ref 1.60–10.00)
LYMPH%: 33.8 % (ref 14.0–49.7)
MCH: 30.9 pg (ref 25.1–34.0)
MCHC: 33.8 g/dL (ref 31.5–36.0)
MCV: 91.5 fL (ref 79.5–101.0)
MONO#: 0.4 10*3/uL (ref 0.1–0.9)
MONO%: 6.6 % (ref 0.0–14.0)
NEUT#: 3.7 10*3/uL (ref 1.5–6.5)
NEUT%: 57.4 % (ref 38.4–76.8)
Platelets: 275 10*3/uL (ref 145–400)
RBC: 4.24 10*6/uL (ref 3.70–5.45)
RDW: 13.5 % (ref 11.2–14.5)
Retic %: 1.22 % (ref 0.70–2.10)
Retic Ct Abs: 51.73 10*3/uL (ref 33.70–90.70)
WBC: 6.4 10*3/uL (ref 3.9–10.3)
lymph#: 2.2 10*3/uL (ref 0.9–3.3)

## 2015-09-05 LAB — FERRITIN CHCC: Ferritin: 124 ng/ml (ref 9–269)

## 2015-09-05 MED ORDER — TAMOXIFEN CITRATE 20 MG PO TABS: 20.0000 mg | ORAL_TABLET | Freq: Every day | ORAL | Status: DC

## 2015-09-05 NOTE — Telephone Encounter (Signed)
per pof to sch pt appt-gave pt copy of avs °

## 2015-09-05 NOTE — Progress Notes (Signed)
Victoria Delacruz  [name], MD [place] [id number]  DIAGNOSIS: Malignant neoplasm of breast (female), unspecified laterality - Plan: tamoxifen (NOLVADEX) 20 MG tablet, MM Digital Diagnostic Unilat R  Malignant neoplasm of upper-outer quadrant of left female breast ([place])  CHIEF COMPLAIN: follow up breast cancer     Malignant neoplasm of female breast ([place])   08/02/2003 Initial Diagnosis MALIGNANT NEOPLASM OF BREAST UNSPECIFIED SITE. High-grade DCIS of Left breast; Tumor was 4.0 cm. Sentinel lymph nodes were negative.  ER, 24%; PR 2%.  Patient had saline implant.    08/02/2003 Surgery L mastectomy with reconstruction [name]   05/07/2010 Surgery R port-a-cath placement   05/11/2010 Relapse/Recurrence Invasive ductal carcinoma of L breast; ER: 99%, positive; PR: 53%, positive; Ki 67 (mib-1): 20%; hER@ neu by FISH without ampliication; ratio of her 2 CEP 17 was 1.17.    05/28/2010 Surgery Local excision of the L invasive ductal carcinoma by [name].   06/08/2010 - 08/10/2010 Chemotherapy Completed 4 cycles of adjuvant chemotherapy with cytoxan and taxotere in combination with neulasta    09/08/2010 - 11/04/2010 Radiation Therapy XRT was given to the left breast and chall wall consisting of 4780 in 26 fractions with an 1800 cGy boost in 9 fractions Dr Valere Dross.   11/04/2010 - 04/17/2011 Chemotherapy Tamoxifen taken sporadically and discontinued due to vaginal bleeding.    08/30/2011 Surgery Laparoscopic assisted vaginal hysterectomy.   04/10/2012 Imaging MRI of the brain normal.  No cause of the patient's headache identified.    12/17/2012 Imaging CT scan of abdomen and pelvis showed no acute findings in the abdomen or pelvis.  There were felt to be hepatic hemangiomas present, mesuring 2.5 cm in the superior right heaptic lobe at the hepatic dome and a 7 mm lesion located anteriorly.    08/23/2013 Imaging Digital diagnostic unilateral  right mammogram showed no mamographic or sonographic evidence of malignancy, right breast.      09/11/2013 -  Anti-estrogen oral therapy Tamoxifen 20 mg once daily, planning for 10 years.   CURRENT THERAPY: Tamoxifen started in 11/2009, held 04/2011-08/2013, and restarted on 09/11/2013   INTERIM HISTORY: She returns for follow up. She is clinically stable. She still complains of diffuse body pain, particularly in the left frontal chest wall, and bilateral hip area. This has been chronic and stable. She also has mild to moderate fatigue, able to function well at home. Appetite is good, no recent weight loss. She's been taking tamoxifen daily, no complaints of side effects.  MEDICAL HISTORY: Past Medical History  Diagnosis Date  . ECTOPIC PREGNANCY 1997 and 2011    x 2  . Hypertension   . Fibromyalgia   . Fibroid   . Breast cancer ([place]) 2004 and 2011  . Anemia   . Arthritis   . Dizziness   . Family history of breast cancer     grandmother  . Blood transfusion 05/17/11, 05/25/11    anemia  . Depression     ALLERGIES:  has No Known Allergies.  MEDICATIONS: has a current medication list which includes the following prescription(s): amlodipine, aspirin-acetaminophen-caffeine, bisoprolol, clonazepam, cyclobenzaprine, duloxetine, famotidine, gabapentin, meloxicam, pantoprazole, polyethylene glycol powder, potassium chloride sa, and tamoxifen.  SURGICAL HISTORY:  Past Surgical History  Procedure Laterality Date  . Laparoscopy for ectopic pregnancy    . Laparoscopy w/ mini-laparotomy    . Reconstruction breast immediate / delayed w/ tissue expander  2004  . Mastectomy  2004  complete with reconstruction x 3, no b/p punctures to left arm  . Laparoscopic assisted vaginal hysterectomy  08/30/2011    Procedure: LAPAROSCOPIC ASSISTED VAGINAL HYSTERECTOMY;  Surgeon: Osborne Oman, MD;  Location: [facility];  Service: Gynecology;  Laterality: N/A;    REVIEW OF SYSTEMS:   Constitutional:  Denies fevers, chills or abnormal weight loss; + headache and neck spasms Eyes: Denies blurriness of vision Ears, nose, mouth, throat, and face: Denies mucositis or sore throat Respiratory: Denies cough, dyspnea or wheezes Cardiovascular: Denies palpitation, chest discomfort or lower extremity swelling Gastrointestinal:  Denies nausea, heartburn or change in bowel habits Skin: Denies abnormal skin rashes Lymphatics: Denies new lymphadenopathy or easy bruising Neurological:Denies numbness, tingling or new weaknesses Behavioral/Psych: Mood is stable, no new changes  All other systems were reviewed with the patient and are negative.  PHYSICAL EXAMINATION: ECOG PERFORMANCE STATUS: 1  Blood pressure 119/77, pulse 60, temperature 98.7 F (37.1 C), temperature source Oral, resp. rate 18, height 5' 4"  (1.626 m), weight 169 lb 4.8 oz (76.794 kg), last menstrual period 04/23/2011, SpO2 98 %.  GENERAL:alert, no distress and comfortable; NAD. SKIN: skin color, texture, turgor are normal, no rashes or significant lesions EYES: normal, Conjunctiva are pink and non-injected, sclera clear OROPHARYNX:no exudate, no erythema and lips, buccal mucosa, and tongue normal  NECK: supple, thyroid normal size, non-tender, without nodularity LYMPH:  no palpable lymphadenopathy in the cervical, axillary or supraclavicular BREASTS: right breast no mass, no skin changes, no nipple changes, left reconstructed breast no skin changes, no adenopathy on either side. (+) Tenderness at the left upper quadrant of breast, around the surgical scar area. LUNGS: clear to auscultation with normal breathing effort, no wheezes or rhonchi HEART: regular rate & rhythm and no murmurs and no lower extremity edema ABDOMEN:abdomen soft, non-tender and normal bowel sounds Musculoskeletal:no cyanosis of digits and no clubbing  NEURO: alert & oriented x 3 with fluent speech, no focal motor/sensory deficits  Labs:  CBC Latest Ref Rng  09/05/2015 03/14/2015 11/13/2014  WBC 3.9 - 10.3 10e3/uL 6.4 7.3 7.3  Hemoglobin 11.6 - 15.9 g/dL 13.1 13.0 14.0  Hematocrit 34.8 - 46.6 % 38.8 37.9 42.7  Platelets 145 - 400 10e3/uL 275 239 263    CMP Latest Ref Rng 03/14/2015 11/13/2014 08/06/2014  Glucose 70 - 140 mg/dl 94 122 97  BUN 7.0 - 26.0 mg/dL 8.5 9.9 11.9  Creatinine 0.6 - 1.1 mg/dL 0.9 1.0 0.8  Sodium 136 - 145 mEq/L 140 139 140  Potassium 3.5 - 5.1 mEq/L 3.6 3.4(L) 3.4(L)  Chloride 96 - 112 mEq/L - - -  CO2 22 - 29 mEq/L 25 26 24   Calcium 8.4 - 10.4 mg/dL 8.8 9.0 9.4  Total Protein 6.4 - 8.3 g/dL 7.5 7.9 -  Total Bilirubin 0.20 - 1.20 mg/dL 0.34 0.67 -  Alkaline Phos 40 - 150 U/L 82 104 -  AST 5 - 34 U/L 22 22 -  ALT 0 - 55 U/L 13 11 -      RADIOGRAPHIC STUDIES: CT of abdomen and pelvis 04/24/2014 CT ABDOMEN AND PELVIS WITH CONTRAST  TECHNIQUE: Multidetector CT imaging of the abdomen and pelvis was performed using the standard protocol following bolus administration of intravenous contrast. CONTRAST: 1m OMNIPAQUE IOHEXOL 300 MG/ML SOLN COMPARISON: December 17 2012 FINDINGS:  There is a 1.9 x 2.4 cm mass with peripheral nodular enhancement in the posterior right lobe liver probably of liver hemangioma unchanged compared to prior CT. The liver is otherwise normal. The spleen, pancreas,  gallbladder, adrenal glands and kidneys are normal. There is no hydronephrosis bilaterally. The aorta is normal without aneurysmal dilatation. There is no abdominal lymphadenopathy. There is no small bowel obstruction or diverticulitis. The appendix is normal. Partial fluid-filled bladder is normal. There is small amount of free fluid in the pelvis. Patient is status post hysterectomy. The visualized lung bases are clear. Left breast implant is noted. No focal discrete lytic or blastic lesion is identified within the visualized bones. IMPRESSION: No evidence of bowel obstruction. The appendix is normal. Probable liver hemangioma unchanged compared  to prior CT. Incidental finding of small amount of free fluid in the pelvis, nonspecific.  Right Breast Mammogram 10/04/2014 BI-RADS 1 exam.  ASSESSMENT: Victoria Delacruz 44 y.o. female with a history of Malignant neoplasm of breast (female), unspecified laterality - Plan: tamoxifen (NOLVADEX) 20 MG tablet, MM Digital Diagnostic Unilat R  Malignant neoplasm of upper-outer quadrant of left female breast ([place])   PLAN:   1. Left breast invasive duct carcinoma (ER+,PR+,HER2 negative) , [id number], stage Ia, with prior left DCIS in 2004 - She is tolerating the adjuvant tamoxifen well. We'll continue tamoxifen for total of 10ears. -Her hot flash has improved, no other significant side effects from tamoxifen -She had a hysterectomy, no risk of tamoxifen-related endometrial cancer. -She will do annual mammogram. She is due next month, I ordered one for her today -I think her left upper chest pain is related to her prior breast surgery.  -Her chronic the body pain and abdominal pain are unlikely related to her breast cancer, she has a unremarkable abdominal CT scan in July 2015. I do not feel she needs a repeat scan. -I encouraged her to eat healthy and exercise regularly.  2. Genetics  -She is young and had recurrent breast cancer. She states that she had genetic test after she was diagnosed with breast cancer first time,   3. HTN, Fibromyalgia, chronic body pain  -She follows up with her primary care physician.   Plan -RTC in 6 month with lab -R diagnostic mammogram in December 2016  I spent 20 minutes counseling the patient face to face. The total time spent in the appointment was 25 minutes.    Truitt Merle  09/05/2015

## 2015-09-05 NOTE — Telephone Encounter (Signed)
K+ 3.3  Today.  Spoke with pt and was informed that pt is not currently taking K-dur.  Instructed pt to take K-dur 20 meq by mouth daily x 7 days as per Dr. Burr Medico.  Gave pt suggestions of dietary food  intake with high potassium level.   Pt voiced understanding.

## 2015-11-04 ENCOUNTER — Other Ambulatory Visit: Payer: Self-pay | Admitting: Hematology

## 2015-11-04 ENCOUNTER — Ambulatory Visit
Admission: RE | Admit: 2015-11-04 | Discharge: 2015-11-04 | Disposition: A | Discharge status: Home / Self Care | Payer: Commercial Managed Care - HMO | Source: Ambulatory Visit | Attending: Hematology | Admitting: Hematology

## 2015-11-04 DIAGNOSIS — C50919 Malignant neoplasm of unspecified site of unspecified female breast: Secondary | ICD-10-CM

## 2015-11-04 DIAGNOSIS — Z1231 Encounter for screening mammogram for malignant neoplasm of breast: Secondary | ICD-10-CM

## 2015-11-14 DIAGNOSIS — F331 Major depressive disorder, recurrent, moderate: Secondary | ICD-10-CM | POA: Diagnosis not present

## 2015-11-14 DIAGNOSIS — E663 Overweight: Secondary | ICD-10-CM | POA: Diagnosis not present

## 2015-11-14 DIAGNOSIS — R1011 Right upper quadrant pain: Secondary | ICD-10-CM | POA: Diagnosis not present

## 2015-11-14 DIAGNOSIS — M797 Fibromyalgia: Secondary | ICD-10-CM | POA: Diagnosis not present

## 2015-11-14 DIAGNOSIS — I10 Essential (primary) hypertension: Secondary | ICD-10-CM | POA: Diagnosis not present

## 2015-11-14 DIAGNOSIS — Z6828 Body mass index (BMI) 28.0-28.9, adult: Secondary | ICD-10-CM | POA: Diagnosis not present

## 2015-12-16 DIAGNOSIS — R1012 Left upper quadrant pain: Secondary | ICD-10-CM | POA: Diagnosis not present

## 2015-12-16 DIAGNOSIS — K625 Hemorrhage of anus and rectum: Secondary | ICD-10-CM | POA: Diagnosis not present

## 2015-12-16 DIAGNOSIS — K649 Unspecified hemorrhoids: Secondary | ICD-10-CM | POA: Diagnosis not present

## 2016-01-06 DIAGNOSIS — J019 Acute sinusitis, unspecified: Secondary | ICD-10-CM | POA: Diagnosis not present

## 2016-01-06 DIAGNOSIS — R6889 Other general symptoms and signs: Secondary | ICD-10-CM | POA: Diagnosis not present

## 2016-02-18 DIAGNOSIS — F331 Major depressive disorder, recurrent, moderate: Secondary | ICD-10-CM | POA: Diagnosis not present

## 2016-02-18 DIAGNOSIS — I10 Essential (primary) hypertension: Secondary | ICD-10-CM | POA: Diagnosis not present

## 2016-02-18 DIAGNOSIS — E559 Vitamin D deficiency, unspecified: Secondary | ICD-10-CM | POA: Diagnosis not present

## 2016-02-18 DIAGNOSIS — M797 Fibromyalgia: Secondary | ICD-10-CM | POA: Diagnosis not present

## 2016-03-04 ENCOUNTER — Telehealth: Payer: Self-pay | Admitting: Hematology

## 2016-03-04 ENCOUNTER — Ambulatory Visit (HOSPITAL_BASED_OUTPATIENT_CLINIC_OR_DEPARTMENT_OTHER): Payer: Commercial Managed Care - HMO | Admitting: Hematology

## 2016-03-04 ENCOUNTER — Other Ambulatory Visit (HOSPITAL_BASED_OUTPATIENT_CLINIC_OR_DEPARTMENT_OTHER): Payer: Commercial Managed Care - HMO

## 2016-03-04 ENCOUNTER — Encounter: Payer: Self-pay | Admitting: Hematology

## 2016-03-04 VITALS — BP 135/95 | HR 71 | Temp 97.5°F | Resp 17 | Ht 64.0 in | Wt 169.4 lb

## 2016-03-04 DIAGNOSIS — Z170 Estrogen receptor positive status [ER+]: Secondary | ICD-10-CM

## 2016-03-04 DIAGNOSIS — C50412 Malignant neoplasm of upper-outer quadrant of left female breast: Secondary | ICD-10-CM

## 2016-03-04 DIAGNOSIS — E876 Hypokalemia: Secondary | ICD-10-CM | POA: Diagnosis not present

## 2016-03-04 DIAGNOSIS — Z853 Personal history of malignant neoplasm of breast: Secondary | ICD-10-CM

## 2016-03-04 DIAGNOSIS — I10 Essential (primary) hypertension: Secondary | ICD-10-CM

## 2016-03-04 DIAGNOSIS — C50912 Malignant neoplasm of unspecified site of left female breast: Secondary | ICD-10-CM

## 2016-03-04 DIAGNOSIS — Z79810 Long term (current) use of selective estrogen receptor modulators (SERMs): Secondary | ICD-10-CM

## 2016-03-04 LAB — CBC & DIFF AND RETIC
BASO%: 0.6 % (ref 0.0–2.0)
Basophils Absolute: 0 10*3/uL (ref 0.0–0.1)
EOS%: 1.8 % (ref 0.0–7.0)
Eosinophils Absolute: 0.1 10*3/uL (ref 0.0–0.5)
HCT: 38.4 % (ref 34.8–46.6)
HGB: 12.9 g/dL (ref 11.6–15.9)
Immature Retic Fract: 9.6 % (ref 1.60–10.00)
LYMPH%: 36.8 % (ref 14.0–49.7)
MCH: 30.8 pg (ref 25.1–34.0)
MCHC: 33.6 g/dL (ref 31.5–36.0)
MCV: 91.6 fL (ref 79.5–101.0)
MONO#: 0.3 10*3/uL (ref 0.1–0.9)
MONO%: 4.3 % (ref 0.0–14.0)
NEUT#: 3.5 10*3/uL (ref 1.5–6.5)
NEUT%: 56.5 % (ref 38.4–76.8)
Platelets: 250 10*3/uL (ref 145–400)
RBC: 4.19 10*6/uL (ref 3.70–5.45)
RDW: 13.2 % (ref 11.2–14.5)
Retic %: 1.63 % (ref 0.70–2.10)
Retic Ct Abs: 68.3 10*3/uL (ref 33.70–90.70)
WBC: 6.3 10*3/uL (ref 3.9–10.3)
lymph#: 2.3 10*3/uL (ref 0.9–3.3)

## 2016-03-04 LAB — COMPREHENSIVE METABOLIC PANEL
ALT: 14 U/L (ref 0–55)
AST: 22 U/L (ref 5–34)
Albumin: 3.7 g/dL (ref 3.5–5.0)
Alkaline Phosphatase: 88 U/L (ref 40–150)
Anion Gap: 8 mEq/L (ref 3–11)
BUN: 6.4 mg/dL — ABNORMAL LOW (ref 7.0–26.0)
CO2: 25 mEq/L (ref 22–29)
Calcium: 8.9 mg/dL (ref 8.4–10.4)
Chloride: 105 mEq/L (ref 98–109)
Creatinine: 0.9 mg/dL (ref 0.6–1.1)
EGFR: >90 mL/min/{1.73_m2} (ref >90)
Glucose: 98 mg/dl (ref 70–140)
Potassium: 3.4 mEq/L — ABNORMAL LOW (ref 3.5–5.1)
Sodium: 139 mEq/L (ref 136–145)
Total Bilirubin: 0.33 mg/dL (ref 0.20–1.20)
Total Protein: 7.3 g/dL (ref 6.4–8.3)

## 2016-03-04 NOTE — Progress Notes (Addendum)
He Has and Is on July 30 [place], MD [address] [place] [address]  DIAGNOSIS: Malignant neoplasm of upper-outer quadrant of left female breast [facility])  CHIEF COMPLAIN: follow up breast cancer     Malignant neoplasm of female breast ([place])   08/02/2003 Initial Diagnosis MALIGNANT NEOPLASM OF BREAST UNSPECIFIED SITE. High-grade DCIS of Left breast; Tumor was 4.0 cm. Sentinel lymph nodes were negative.  ER, 24%; PR 2%.  Patient had saline implant.    08/02/2003 Surgery L mastectomy with reconstruction [name]   05/07/2010 Surgery R port-a-cath placement   05/11/2010 Relapse/Recurrence Invasive ductal carcinoma of L breast; ER: 99%, positive; PR: 53%, positive; Ki 67 (mib-1): 20%; hER@ neu by FISH without ampliication; ratio of her 2 CEP 17 was 1.17.    05/28/2010 Surgery Local excision of the L invasive ductal carcinoma by [name].   06/08/2010 - 08/10/2010 Chemotherapy Completed 4 cycles of adjuvant chemotherapy with cytoxan and taxotere in combination with neulasta    09/08/2010 - 11/04/2010 Radiation Therapy XRT was given to the left breast and chall wall consisting of 4780 in 26 fractions with an 1800 cGy boost in 9 fractions Dr Valere Dross.   11/04/2010 - 04/17/2011 Chemotherapy Tamoxifen taken sporadically and discontinued due to vaginal bleeding.    08/30/2011 Surgery Laparoscopic assisted vaginal hysterectomy.   04/10/2012 Imaging MRI of the brain normal.  No cause of the patient's headache identified.    12/17/2012 Imaging CT scan of abdomen and pelvis showed no acute findings in the abdomen or pelvis.  There were felt to be hepatic hemangiomas present, mesuring 2.5 cm in the superior right heaptic lobe at the hepatic dome and a 7 mm lesion located anteriorly.    08/23/2013 Imaging Digital diagnostic unilateral right mammogram showed no mamographic or sonographic evidence of malignancy, right breast.      09/11/2013 -   Anti-estrogen oral therapy Tamoxifen 20 mg once daily, planning for 10 years.   CURRENT THERAPY: Tamoxifen started in 11/2009, held 04/2011-08/2013, and restarted on 09/11/2013   INTERIM HISTORY: Victoria Delacruz returns for follow-up. She doing well overall. She has chronic body pain, she is on Cymbalta and Lyrica, she complains of left shoulder pain, which radiates to the back, no limited range of motion of the left shoulder no numbness or tingling. She also reports mild hematochezia for the past few months, no rectal pain or constipation, she is scheduled to see gastroenterologist Dr. Earlean Shawl tomorrow . She otherwise doing well, has good appetite and eating well, she trying to lose weight. She bikes sometime. She has a fost child, who is a 82 year old girl, and she is little stressed by taking care of her.  MEDICAL HISTORY: Past Medical History  Diagnosis Date  . ECTOPIC PREGNANCY 1997 and 2011    x 2  . Hypertension   . Fibromyalgia   . Fibroid   . Breast cancer ([place]) 2004 and 2011  . Anemia   . Arthritis   . Dizziness   . Family history of breast cancer     grandmother  . Blood transfusion 05/17/11, 05/25/11    anemia  . Depression     ALLERGIES:  has No Known Allergies.  MEDICATIONS: has a current medication list which includes the following prescription(s): amlodipine, aspirin-acetaminophen-caffeine, bisoprolol, cyclobenzaprine, duloxetine, lyrica, meloxicam, polyethylene glycol powder, propranolol, tamoxifen, famotidine, pantoprazole, and potassium chloride sa.  SURGICAL HISTORY:  Past Surgical History  Procedure Laterality Date  . Laparoscopy  for ectopic pregnancy    . Laparoscopy w/ mini-laparotomy    . Reconstruction breast immediate / delayed w/ tissue expander  2004  . Mastectomy  2004    complete with reconstruction x 3, no b/p punctures to left arm  . Laparoscopic assisted vaginal hysterectomy  08/30/2011    Procedure: LAPAROSCOPIC ASSISTED VAGINAL HYSTERECTOMY;  Surgeon:  Osborne Oman, MD;  Location: [facility];  Service: Gynecology;  Laterality: N/A;    REVIEW OF SYSTEMS:   Constitutional: Denies fevers, chills or abnormal weight loss; + headache and neck spasms Eyes: Denies blurriness of vision Ears, nose, mouth, throat, and face: Denies mucositis or sore throat Respiratory: Denies cough, dyspnea or wheezes Cardiovascular: Denies palpitation, chest discomfort or lower extremity swelling Gastrointestinal:  Denies nausea, heartburn or change in bowel habits Skin: Denies abnormal skin rashes Lymphatics: Denies new lymphadenopathy or easy bruising Neurological:Denies numbness, tingling or new weaknesses Behavioral/Psych: Mood is stable, no new changes  All other systems were reviewed with the patient and are negative.  PHYSICAL EXAMINATION: ECOG PERFORMANCE STATUS: 1  Blood pressure 135/95, pulse 71, temperature 97.5 F (36.4 C), temperature source Oral, resp. rate 17, height _0  (1.626 m), weight 169 lb 6.4 oz (76.839 kg), last menstrual period 04/23/2011, SpO2 98 %.  GENERAL:alert, no distress and comfortable; NAD. SKIN: skin color, texture, turgor are normal, no rashes or significant lesions EYES: normal, Conjunctiva are pink and non-injected, sclera clear OROPHARYNX:no exudate, no erythema and lips, buccal mucosa, and tongue normal  NECK: supple, thyroid normal size, non-tender, without nodularity LYMPH:  no palpable lymphadenopathy in the cervical, axillary or supraclavicular BREASTS: right breast no mass, no skin changes, no nipple changes, left reconstructed breast no skin changes, no adenopathy on either side. No tenderness  LUNGS: clear to auscultation with normal breathing effort, no wheezes or rhonchi HEART: regular rate & rhythm and no murmurs and no lower extremity edema ABDOMEN:abdomen soft, mil tenderness in the right upper quadrant, which is chronic, no other tenderness and normal bowel sounds Musculoskeletal:no cyanosis of digits and  no clubbing  NEURO: alert & oriented x 3 with fluent speech, no focal motor/sensory deficits  Labs:  CBC Latest Ref Rng 03/04/2016 09/05/2015 03/14/2015  WBC 3.9 - 10.3 10e3/uL 6.3 6.4 7.3  Hemoglobin 11.6 - 15.9 g/dL 12.9 13.1 13.0  Hematocrit 34.8 - 46.6 % 38.4 38.8 37.9  Platelets 145 - 400 10e3/uL 250 275 239    CMP Latest Ref Rng 03/04/2016 09/05/2015 03/14/2015  Glucose 70 - 140 mg/dl 98 104 94  BUN 7.0 - 26.0 mg/dL 6.4(L) 13.2 8.5  Creatinine 0.6 - 1.1 mg/dL 0.9 0.8 0.9  Sodium 136 - 145 mEq/L 139 142 140  Potassium 3.5 - 5.1 mEq/L 3.4(L) 3.3(L) 3.6  CO2 22 - 29 mEq/L _1 Calcium 8.4 - 10.4 mg/dL 8.9 9.2 8.8  Total Protein 6.4 - 8.3 g/dL 7.3 7.4 7.5  Total Bilirubin 0.20 - 1.20 mg/dL 0.33 0.53 0.34  Alkaline Phos 40 - 150 U/L 88 89 82  AST 5 - 34 U/L _2 ALT 0 - 55 U/L _3 RADIOGRAPHIC STUDIES:  Mammogram digital screening right 11/04/2015 IMPRESSION: No mammographic evidence of malignancy. A result letter of this screening mammogram will be mailed directly to the patient.  RECOMMENDATION: Screening mammogram in one year. (Code:SM-B-01Y)  BI-RADS CATEGORY 1: Negative.   ASSESSMENT: Victoria Delacruz 45 y.o. female with a history of Malignant neoplasm of upper-outer quadrant of  left female breast ([place])   PLAN:   1. Left breast invasive duct carcinoma (ER+,PR+,HER2 negative) , pT1aN0M0, stage Ia, with prior left DCIS in 2004 - She is tolerating the adjuvant tamoxifen well. We'll continue tamoxifen for total of 10 years until 2023 -Her hot flash has  been stable and manageable, no other significant side effects from tamoxifen -She had a hysterectomy, no risk of tamoxifen-related endometrial cancer. -She is clinically doing well, lab and exam are unremarkable, no evidence of recurrence. -She will do annual mammogram.  -Her chronic the body pain and abdominal pain are unlikely related to her breast cancer, she has a unremarkable abdominal CT scan  in July 2015. -I encouraged her to eat healthy and exercise regularly, and try to lose some weight. She has gained about 20 pounds in the past 2 years   2. Genetics  -She is young and had recurrent breast cancer. She states that she had genetic test after she was diagnosed with breast cancer first time,   3. HTN, Fibromyalgia, chronic body pain  -She follows up with her primary care physician.  4. Hypokalemia -Her potassium 3.4 today, I encouraged her to take potassium enriched food   5. Hematochezia -He is scheduled to see gastroenterologist Dr. Earlean Shawl tomorrow, she Delacruz need a colonoscopy.  Plan Lab results reviewed with patient -RTC in 6 month with lab -R mammogram in Jan 2018, will order on next visit   I spent 20 minutes counseling the patient face to face. The total time spent in the appointment was 25 minutes.    Truitt Merle  03/04/2016

## 2016-03-04 NOTE — Telephone Encounter (Signed)
Gave pt appt & avs °

## 2016-03-05 DIAGNOSIS — K625 Hemorrhage of anus and rectum: Secondary | ICD-10-CM | POA: Diagnosis not present

## 2016-03-05 DIAGNOSIS — R1012 Left upper quadrant pain: Secondary | ICD-10-CM | POA: Diagnosis not present

## 2016-03-22 DIAGNOSIS — K641 Second degree hemorrhoids: Secondary | ICD-10-CM | POA: Diagnosis not present

## 2016-03-22 DIAGNOSIS — Z803 Family history of malignant neoplasm of breast: Secondary | ICD-10-CM | POA: Diagnosis not present

## 2016-03-22 DIAGNOSIS — R1032 Left lower quadrant pain: Secondary | ICD-10-CM | POA: Diagnosis not present

## 2016-03-22 DIAGNOSIS — Z853 Personal history of malignant neoplasm of breast: Secondary | ICD-10-CM | POA: Diagnosis not present

## 2016-03-22 DIAGNOSIS — K648 Other hemorrhoids: Secondary | ICD-10-CM | POA: Diagnosis not present

## 2016-03-22 DIAGNOSIS — Z801 Family history of malignant neoplasm of trachea, bronchus and lung: Secondary | ICD-10-CM | POA: Diagnosis not present

## 2016-03-22 DIAGNOSIS — K921 Melena: Secondary | ICD-10-CM | POA: Diagnosis not present

## 2016-03-22 DIAGNOSIS — K625 Hemorrhage of anus and rectum: Secondary | ICD-10-CM | POA: Diagnosis not present

## 2016-04-07 DIAGNOSIS — K625 Hemorrhage of anus and rectum: Secondary | ICD-10-CM | POA: Diagnosis not present

## 2016-04-07 DIAGNOSIS — K641 Second degree hemorrhoids: Secondary | ICD-10-CM | POA: Diagnosis not present

## 2016-04-21 DIAGNOSIS — K641 Second degree hemorrhoids: Secondary | ICD-10-CM | POA: Diagnosis not present

## 2016-05-12 DIAGNOSIS — K641 Second degree hemorrhoids: Secondary | ICD-10-CM | POA: Diagnosis not present

## 2016-06-05 DIAGNOSIS — M25511 Pain in right shoulder: Secondary | ICD-10-CM | POA: Diagnosis not present

## 2016-07-07 DIAGNOSIS — M7541 Impingement syndrome of right shoulder: Secondary | ICD-10-CM | POA: Diagnosis not present

## 2016-07-30 DIAGNOSIS — I10 Essential (primary) hypertension: Secondary | ICD-10-CM | POA: Diagnosis not present

## 2016-07-30 DIAGNOSIS — M797 Fibromyalgia: Secondary | ICD-10-CM | POA: Diagnosis not present

## 2016-07-30 DIAGNOSIS — E559 Vitamin D deficiency, unspecified: Secondary | ICD-10-CM | POA: Diagnosis not present

## 2016-07-30 DIAGNOSIS — Z23 Encounter for immunization: Secondary | ICD-10-CM | POA: Diagnosis not present

## 2016-07-30 DIAGNOSIS — C50912 Malignant neoplasm of unspecified site of left female breast: Secondary | ICD-10-CM | POA: Diagnosis not present

## 2016-08-19 DIAGNOSIS — R112 Nausea with vomiting, unspecified: Secondary | ICD-10-CM | POA: Diagnosis not present

## 2016-08-31 NOTE — Progress Notes (Signed)
[facility] OFFICE PROGRESS NOTE  [name], MD [address] [place] [address]  DIAGNOSIS: Malignant neoplasm of upper-outer quadrant of left breast in female, estrogen receptor positive ([place]) - Plan: Vitamin D 25 hydroxy, MM DIAG BREAST TOMO UNI RIGHT  Fibromyalgia  Essential hypertension  CHIEF COMPLAIN: follow up breast cancer     Malignant neoplasm of female breast ([place])   08/02/2003 Initial Diagnosis    MALIGNANT NEOPLASM OF BREAST UNSPECIFIED SITE. High-grade DCIS of Left breast; Tumor was 4.0 cm. Sentinel lymph nodes were negative.  ER, 24%; PR 2%.  Patient had saline implant.       08/02/2003 Surgery    L mastectomy with reconstruction [name]      05/07/2010 Surgery    R port-a-cath placement      05/11/2010 Relapse/Recurrence    Invasive ductal carcinoma of L breast; ER: 99%, positive; PR: 53%, positive; Ki 67 (mib-1): 20%; hER@ neu by FISH without ampliication; ratio of her 2 CEP 17 was 1.17.       05/28/2010 Surgery    Local excision of the L invasive ductal carcinoma by [name].      06/08/2010 - 08/10/2010 Chemotherapy    Completed 4 cycles of adjuvant chemotherapy with cytoxan and taxotere in combination with neulasta       09/08/2010 - 11/04/2010 Radiation Therapy    XRT was given to the left breast and chall wall consisting of 4780 in 26 fractions with an 1800 cGy boost in 9 fractions Dr Valere Dross.      11/04/2010 - 04/17/2011 Chemotherapy    Tamoxifen taken sporadically and discontinued due to vaginal bleeding.       08/30/2011 Surgery    Laparoscopic assisted vaginal hysterectomy.      04/10/2012 Imaging    MRI of the brain normal.  No cause of the patient's headache identified.       12/17/2012 Imaging    CT scan of abdomen and pelvis showed no acute findings in the abdomen or pelvis.  There were felt to be hepatic hemangiomas present, mesuring 2.5 cm in the superior right heaptic lobe at the hepatic dome and  a 7 mm lesion located anteriorly.       08/23/2013 Imaging    Digital diagnostic unilateral right mammogram showed no mamographic or sonographic evidence of malignancy, right breast.         09/11/2013 -  Anti-estrogen oral therapy    Tamoxifen 20 mg once daily, planning for 10 years.      CURRENT THERAPY: Tamoxifen started in 11/2009, held 04/2011-08/2013, and restarted on 09/11/2013   INTERIM HISTORY: Victoria Delacruz returns for follow-up. She doing well overall. She has chronic body pain, she is still taking Cymbalta and Lyrica. She reports neck pain and right shoulder pain. The right shoulder pain is secondary to injury and she saw [facility] for it. She plans to follow up with them again. She talks about stress at home related to taking care of her young foster daughter.   She is taking tamoxifen without issue, though she admits she still has hot flashes. These hot flashes are manageable and not as bad as they were while going through chemotherapy.   She is not taking any new medications. She is not taking a potassium supplement. She denies diarrhea.  She reports persistent right sided recurring headache with blurred vision bilaterally for the past couple months. When asked to describe this headache on a pain scale, she states "  some days it feels like her head is going to pop". She takes Excedrin Migraine for this and has a history of migraines. Previous migraines would come and go but did so nearly constantly. Previous migraines were intense enough that she would vomit. She has not spoken with her PCP Dr. Dema Severin about these headaches. She last saw Dr. Dema Severin in October.  MEDICAL HISTORY: Past Medical History:  Diagnosis Date  . Anemia   . Arthritis   . Blood transfusion 05/17/11, 05/25/11   anemia  . Breast cancer ([place]) 2004 and 2011  . Depression   . Dizziness   . ECTOPIC PREGNANCY 1997 and 2011   x 2  . Family history of breast cancer    grandmother  . Fibroid   . Fibromyalgia    . Hypertension     ALLERGIES:  has No Known Allergies.  MEDICATIONS: has a current medication list which includes the following prescription(s): amlodipine, aspirin-acetaminophen-caffeine, cholecalciferol, cyclobenzaprine, duloxetine, lyrica, meloxicam, polyethylene glycol powder, propranolol, tamoxifen, and potassium chloride sa.  SURGICAL HISTORY:  Past Surgical History:  Procedure Laterality Date  . LAPAROSCOPIC ASSISTED VAGINAL HYSTERECTOMY  08/30/2011   Procedure: LAPAROSCOPIC ASSISTED VAGINAL HYSTERECTOMY;  Surgeon: Osborne Oman, MD;  Location: [facility];  Service: Gynecology;  Laterality: N/A;  . LAPAROSCOPY FOR ECTOPIC PREGNANCY    . LAPAROSCOPY W/ MINI-LAPAROTOMY    . MASTECTOMY  2004   complete with reconstruction x 3, no b/p punctures to left arm  . RECONSTRUCTION BREAST IMMEDIATE / DELAYED W/ TISSUE EXPANDER  2004    REVIEW OF SYSTEMS:   Constitutional: Denies fevers, chills or abnormal weight loss; (+) hot flashes, persistent headaches Eyes: Denies blurriness of vision Ears, nose, mouth, throat, and face: Denies mucositis or sore throat Respiratory: Denies cough, dyspnea or wheezes Cardiovascular: Denies palpitation, chest discomfort or lower extremity swelling Gastrointestinal:  Denies nausea, heartburn or change in bowel habits Skin: Denies abnormal skin rashes Musculoskeletal: (+) chronic body pain, neck and right shoulder pain Lymphatics: Denies new lymphadenopathy or easy bruising Neurological:Denies numbness, tingling or new weaknesses Behavioral/Psych: Mood is stable, no new changes  All other systems were reviewed with the patient and are negative.   PHYSICAL EXAMINATION: ECOG PERFORMANCE STATUS: 1  Blood pressure 128/87, pulse (!) 58, temperature 98.1 F (36.7 C), temperature source Oral, resp. rate 20, height _0  (1.626 m), weight 173 lb 3.2 oz (78.6 kg), last menstrual period 04/23/2011, SpO2 99 %.  GENERAL:alert, no distress and comfortable;  NAD. SKIN: skin color, texture, turgor are normal, no rashes or significant lesions EYES: normal, Conjunctiva are pink and non-injected, sclera clear OROPHARYNX:no exudate, no erythema and lips, buccal mucosa, and tongue normal  NECK: supple, thyroid normal size, non-tender, without nodularity LYMPH:  no palpable lymphadenopathy in the cervical, axillary or supraclavicular BREASTS: right breast no mass, no skin changes, no nipple changes, left reconstructed breast no skin changes, no adenopathy on either side. No tenderness  LUNGS: clear to auscultation with normal breathing effort, no wheezes or rhonchi HEART: regular rate & rhythm and no murmurs and no lower extremity edema ABDOMEN:abdomen soft, mil tenderness in the right upper quadrant, which is chronic, no other tenderness and normal bowel sounds Musculoskeletal:no cyanosis of digits and no clubbing  NEURO: alert & oriented x 3 with fluent speech, no focal motor/sensory deficits   Labs:  CBC Latest Ref Rng & Units 09/03/2016 03/04/2016 09/05/2015  WBC 3.9 - 10.3 10e3/uL 6.9 6.3 6.4  Hemoglobin 11.6 - 15.9 g/dL 13.0 12.9 13.1  Hematocrit  34.8 - 46.6 % 38.2 38.4 38.8  Platelets 145 - 400 10e3/uL 299 250 275    CMP Latest Ref Rng & Units 09/03/2016 03/04/2016 09/05/2015  Glucose 70 - 140 mg/dl 104 98 104  BUN 7.0 - 26.0 mg/dL 9.1 6.4(L) 13.2  Creatinine 0.6 - 1.1 mg/dL 0.8 0.9 0.8  Sodium 136 - 145 mEq/L 141 139 142  Potassium 3.5 - 5.1 mEq/L 3.4(L) 3.4(L) 3.3(L)  Chloride 96 - 112 mEq/L - - -  CO2 22 - 29 mEq/L _0 Calcium 8.4 - 10.4 mg/dL 8.8 8.9 9.2  Total Protein 6.4 - 8.3 g/dL 7.5 7.3 7.4  Total Bilirubin 0.20 - 1.20 mg/dL 0.35 0.33 0.53  Alkaline Phos 40 - 150 U/L 97 88 89  AST 5 - 34 U/L _1 ALT 0 - 55 U/L _2 RADIOGRAPHIC STUDIES:  Mammogram digital screening right 11/04/2015 IMPRESSION: No mammographic evidence of malignancy. A result letter of this screening mammogram will be mailed directly to the  patient.  RECOMMENDATION: Screening mammogram in one year. (Code:SM-B-01Y)  BI-RADS CATEGORY 1: Negative.   ASSESSMENT: Victoria Delacruz 45 y.o. female with a history of Malignant neoplasm of upper-outer quadrant of left breast in female, estrogen receptor positive ([place]) - Plan: Vitamin D 25 hydroxy, MM DIAG BREAST TOMO UNI RIGHT  Fibromyalgia  Essential hypertension   PLAN:   1. Left breast invasive duct carcinoma (ER+,PR+,HER2 negative) , pT1aN0M0, stage Ia diagnosed in 2011, with prior left DCIS in 2004 - She is tolerating the adjuvant tamoxifen well. We'll continue tamoxifen for total of 10 years until 2023 -Her hot flash has been stable and manageable, no other significant side effects from tamoxifen -She had a hysterectomy, no risk of tamoxifen-related endometrial cancer. -She is clinically doing well, lab and exam are unremarkable, no evidence of recurrence. -She will do annual mammogram.  -Her chronic body pain and abdominal pain are unlikely related to her breast cancer, she has a unremarkable abdominal CT scan in June 2015. -I encouraged her to eat healthy and exercise regularly, and try to lose some weight. She has gained about 20 pounds in the past 2 years  2. Genetics  -She is young and had recurrent breast cancer. She states that she had genetic test after she was diagnosed with breast cancer first time   3. HTN, Fibromyalgia, chronic body pain  -She follows up with her primary care physician.  4. Hypokalemia -Her potassium is 3.4 today, I encouraged her to take potassium enriched food or a potassium supplement  5. Right sided headaches with blurry vision bilaterally -Her chronic headaches are unlikely related to her breast cancer, she had an unremarkable Brain MRI in June 2013 -She has history of migraine headache -She will follow up with her primary care physician  Plan Lab results reviewed with patient -Refilled tamoxifen today -Ordered R mammogram  for January 2018 -RTC in 6 month with lab    I spent 20 minutes counseling the patient face to face. The total time spent in the appointment was 25 minutes.    This document serves as a record of services personally performed by Truitt Merle, MD. It was created on her behalf by Arlyce Harman, a trained medical scribe. The creation of this record is based on the scribe's personal observations and the provider's statements to them. This document has been checked and approved by the attending provider.   Truitt Merle  09/03/2016

## 2016-09-03 ENCOUNTER — Ambulatory Visit (HOSPITAL_BASED_OUTPATIENT_CLINIC_OR_DEPARTMENT_OTHER): Payer: Medicare Other | Admitting: Hematology

## 2016-09-03 ENCOUNTER — Telehealth: Payer: Self-pay | Admitting: Hematology

## 2016-09-03 ENCOUNTER — Other Ambulatory Visit (HOSPITAL_BASED_OUTPATIENT_CLINIC_OR_DEPARTMENT_OTHER): Payer: Commercial Managed Care - HMO

## 2016-09-03 ENCOUNTER — Encounter: Payer: Self-pay | Admitting: Hematology

## 2016-09-03 VITALS — BP 128/87 | HR 58 | Temp 98.1°F | Resp 20 | Ht 64.0 in | Wt 173.2 lb

## 2016-09-03 DIAGNOSIS — Z79810 Long term (current) use of selective estrogen receptor modulators (SERMs): Secondary | ICD-10-CM

## 2016-09-03 DIAGNOSIS — Z170 Estrogen receptor positive status [ER+]: Secondary | ICD-10-CM

## 2016-09-03 DIAGNOSIS — C50412 Malignant neoplasm of upper-outer quadrant of left female breast: Secondary | ICD-10-CM

## 2016-09-03 DIAGNOSIS — H538 Other visual disturbances: Secondary | ICD-10-CM

## 2016-09-03 DIAGNOSIS — I10 Essential (primary) hypertension: Secondary | ICD-10-CM | POA: Diagnosis not present

## 2016-09-03 DIAGNOSIS — R51 Headache: Secondary | ICD-10-CM

## 2016-09-03 DIAGNOSIS — C50912 Malignant neoplasm of unspecified site of left female breast: Secondary | ICD-10-CM

## 2016-09-03 DIAGNOSIS — E876 Hypokalemia: Secondary | ICD-10-CM

## 2016-09-03 DIAGNOSIS — M797 Fibromyalgia: Secondary | ICD-10-CM

## 2016-09-03 LAB — CBC & DIFF AND RETIC
BASO%: 0.7 % (ref 0.0–2.0)
Basophils Absolute: 0.1 10*3/uL (ref 0.0–0.1)
EOS%: 1.8 % (ref 0.0–7.0)
Eosinophils Absolute: 0.1 10*3/uL (ref 0.0–0.5)
HCT: 38.2 % (ref 34.8–46.6)
HGB: 13 g/dL (ref 11.6–15.9)
Immature Retic Fract: 5 % (ref 1.60–10.00)
LYMPH%: 34.9 % (ref 14.0–49.7)
MCH: 30.7 pg (ref 25.1–34.0)
MCHC: 34 g/dL (ref 31.5–36.0)
MCV: 90.1 fL (ref 79.5–101.0)
MONO#: 0.3 10*3/uL (ref 0.1–0.9)
MONO%: 4.8 % (ref 0.0–14.0)
NEUT#: 4 10*3/uL (ref 1.5–6.5)
NEUT%: 57.8 % (ref 38.4–76.8)
Platelets: 299 10*3/uL (ref 145–400)
RBC: 4.24 10*6/uL (ref 3.70–5.45)
RDW: 13.7 % (ref 11.2–14.5)
Retic %: 1.38 % (ref 0.70–2.10)
Retic Ct Abs: 58.51 10*3/uL (ref 33.70–90.70)
WBC: 6.9 10*3/uL (ref 3.9–10.3)
lymph#: 2.4 10*3/uL (ref 0.9–3.3)
nRBC: 0 % (ref 0–0)

## 2016-09-03 LAB — COMPREHENSIVE METABOLIC PANEL
ALT: 14 U/L (ref 0–55)
AST: 20 U/L (ref 5–34)
Albumin: 3.4 g/dL — ABNORMAL LOW (ref 3.5–5.0)
Alkaline Phosphatase: 97 U/L (ref 40–150)
Anion Gap: 8 mEq/L (ref 3–11)
BUN: 9.1 mg/dL (ref 7.0–26.0)
CO2: 25 mEq/L (ref 22–29)
Calcium: 8.8 mg/dL (ref 8.4–10.4)
Chloride: 108 mEq/L (ref 98–109)
Creatinine: 0.8 mg/dL (ref 0.6–1.1)
EGFR: >90 mL/min/{1.73_m2} (ref >90)
Glucose: 104 mg/dl (ref 70–140)
Potassium: 3.4 mEq/L — ABNORMAL LOW (ref 3.5–5.1)
Sodium: 141 mEq/L (ref 136–145)
Total Bilirubin: 0.35 mg/dL (ref 0.20–1.20)
Total Protein: 7.5 g/dL (ref 6.4–8.3)

## 2016-09-03 MED ORDER — TAMOXIFEN CITRATE 20 MG PO TABS: 20.0000 mg | ORAL_TABLET | Freq: Every day | ORAL | 5 refills | Status: DC

## 2016-09-03 NOTE — Telephone Encounter (Signed)
Appointments scheduled per 09/03/16 los. AVS report and appointment schedule given to patient, per 09/03/16 los.

## 2016-09-03 NOTE — Progress Notes (Signed)
Faxed script for Tamoxifen to [facility] as per pt's request.

## 2016-09-04 LAB — VITAMIN D 25 HYDROXY (VIT D DEFICIENCY, FRACTURES): Vitamin D, 25-Hydroxy: 13.5 ng/mL — ABNORMAL LOW (ref 30.0–100.0)

## 2016-09-07 ENCOUNTER — Telehealth: Payer: Self-pay | Admitting: *Deleted

## 2016-09-07 ENCOUNTER — Other Ambulatory Visit: Payer: Self-pay | Admitting: *Deleted

## 2016-09-07 DIAGNOSIS — Z170 Estrogen receptor positive status [ER+]: Secondary | ICD-10-CM

## 2016-09-07 DIAGNOSIS — C50412 Malignant neoplasm of upper-outer quadrant of left female breast: Secondary | ICD-10-CM

## 2016-09-07 MED ORDER — VITAMIN D (CHOLECALCIFEROL) 25 MCG (1000 UT) PO TABS: 2000.0000 [IU] | ORAL_TABLET | Freq: Every day | ORAL | 1 refills | Status: DC

## 2016-09-07 MED ORDER — ERGOCALCIFEROL 1.25 MG (50000 UT) PO CAPS: 50000.0000 [IU] | ORAL_CAPSULE | ORAL | 0 refills | Status: DC

## 2016-09-07 NOTE — Telephone Encounter (Signed)
Spoke with pt and informed pt that her Vit D level is very low.  Informed pt that scripts for Vit D will be sent to [facility].   Instructed pt to take  Vit D 50,000 units  Weekly  For  8  Weeks ;  Then  2000 units  Daily  As per Dr. Ernestina Penna instructions.  Pt voiced understanding.

## 2016-10-05 DIAGNOSIS — N390 Urinary tract infection, site not specified: Secondary | ICD-10-CM | POA: Diagnosis not present

## 2016-10-05 DIAGNOSIS — R350 Frequency of micturition: Secondary | ICD-10-CM | POA: Diagnosis not present

## 2016-11-08 DIAGNOSIS — M797 Fibromyalgia: Secondary | ICD-10-CM | POA: Diagnosis not present

## 2016-11-08 DIAGNOSIS — R110 Nausea: Secondary | ICD-10-CM | POA: Diagnosis not present

## 2016-11-08 DIAGNOSIS — I10 Essential (primary) hypertension: Secondary | ICD-10-CM | POA: Diagnosis not present

## 2016-11-08 DIAGNOSIS — R079 Chest pain, unspecified: Secondary | ICD-10-CM | POA: Diagnosis not present

## 2016-11-08 DIAGNOSIS — C50912 Malignant neoplasm of unspecified site of left female breast: Secondary | ICD-10-CM | POA: Diagnosis not present

## 2016-11-08 DIAGNOSIS — M542 Cervicalgia: Secondary | ICD-10-CM | POA: Diagnosis not present

## 2016-11-08 DIAGNOSIS — D0512 Intraductal carcinoma in situ of left breast: Secondary | ICD-10-CM | POA: Diagnosis not present

## 2016-11-09 ENCOUNTER — Other Ambulatory Visit: Payer: Self-pay | Admitting: Family Medicine

## 2016-11-10 ENCOUNTER — Other Ambulatory Visit: Payer: Self-pay | Admitting: Family Medicine

## 2016-11-10 DIAGNOSIS — M542 Cervicalgia: Secondary | ICD-10-CM

## 2016-11-19 ENCOUNTER — Other Ambulatory Visit: Payer: Self-pay | Admitting: Family Medicine

## 2016-11-19 ENCOUNTER — Inpatient Hospital Stay
Admission: RE | Admit: 2016-11-19 | Discharge: 2016-11-19 | Disposition: A | Discharge status: Home / Self Care | Payer: Commercial Managed Care - HMO | Source: Ambulatory Visit | Attending: Family Medicine | Admitting: Family Medicine

## 2016-11-19 DIAGNOSIS — M542 Cervicalgia: Secondary | ICD-10-CM

## 2016-11-26 ENCOUNTER — Ambulatory Visit
Admission: RE | Admit: 2016-11-26 | Discharge: 2016-11-26 | Disposition: A | Discharge status: Home / Self Care | Payer: Medicare Other | Source: Ambulatory Visit | Attending: Family Medicine | Admitting: Family Medicine

## 2016-11-26 ENCOUNTER — Other Ambulatory Visit: Payer: Self-pay | Admitting: Family Medicine

## 2016-11-26 DIAGNOSIS — M542 Cervicalgia: Secondary | ICD-10-CM

## 2016-11-26 DIAGNOSIS — R079 Chest pain, unspecified: Secondary | ICD-10-CM

## 2016-11-26 DIAGNOSIS — Z0389 Encounter for observation for other suspected diseases and conditions ruled out: Secondary | ICD-10-CM | POA: Diagnosis not present

## 2016-11-26 DIAGNOSIS — M50222 Other cervical disc displacement at C5-C6 level: Secondary | ICD-10-CM | POA: Diagnosis not present

## 2016-11-26 MED ORDER — GADOBENATE DIMEGLUMINE 529 MG/ML IV SOLN: 15.0000 mL | Freq: Once | INTRAVENOUS | Status: DC | PRN

## 2017-01-03 DIAGNOSIS — R51 Headache: Secondary | ICD-10-CM | POA: Diagnosis not present

## 2017-01-03 DIAGNOSIS — M5412 Radiculopathy, cervical region: Secondary | ICD-10-CM | POA: Diagnosis not present

## 2017-01-03 DIAGNOSIS — M50222 Other cervical disc displacement at C5-C6 level: Secondary | ICD-10-CM | POA: Diagnosis not present

## 2017-01-05 ENCOUNTER — Other Ambulatory Visit: Payer: Self-pay | Admitting: Neurosurgery

## 2017-01-05 DIAGNOSIS — R51 Headache: Principal | ICD-10-CM

## 2017-01-05 DIAGNOSIS — R519 Headache, unspecified: Secondary | ICD-10-CM

## 2017-01-10 ENCOUNTER — Other Ambulatory Visit: Payer: Commercial Managed Care - HMO

## 2017-01-11 ENCOUNTER — Ambulatory Visit
Admission: RE | Admit: 2017-01-11 | Discharge: 2017-01-11 | Disposition: A | Discharge status: Home / Self Care | Payer: Commercial Managed Care - HMO | Source: Ambulatory Visit | Attending: Neurosurgery | Admitting: Neurosurgery

## 2017-01-11 DIAGNOSIS — R519 Headache, unspecified: Secondary | ICD-10-CM

## 2017-01-11 DIAGNOSIS — R51 Headache: Principal | ICD-10-CM

## 2017-01-12 DIAGNOSIS — M25612 Stiffness of left shoulder, not elsewhere classified: Secondary | ICD-10-CM | POA: Diagnosis not present

## 2017-01-12 DIAGNOSIS — R293 Abnormal posture: Secondary | ICD-10-CM | POA: Diagnosis not present

## 2017-01-12 DIAGNOSIS — M25512 Pain in left shoulder: Secondary | ICD-10-CM | POA: Diagnosis not present

## 2017-01-12 DIAGNOSIS — M542 Cervicalgia: Secondary | ICD-10-CM | POA: Diagnosis not present

## 2017-01-14 DIAGNOSIS — M25512 Pain in left shoulder: Secondary | ICD-10-CM | POA: Diagnosis not present

## 2017-01-14 DIAGNOSIS — M542 Cervicalgia: Secondary | ICD-10-CM | POA: Diagnosis not present

## 2017-01-14 DIAGNOSIS — R293 Abnormal posture: Secondary | ICD-10-CM | POA: Diagnosis not present

## 2017-01-14 DIAGNOSIS — M25612 Stiffness of left shoulder, not elsewhere classified: Secondary | ICD-10-CM | POA: Diagnosis not present

## 2017-01-17 DIAGNOSIS — M542 Cervicalgia: Secondary | ICD-10-CM | POA: Diagnosis not present

## 2017-01-17 DIAGNOSIS — R293 Abnormal posture: Secondary | ICD-10-CM | POA: Diagnosis not present

## 2017-01-17 DIAGNOSIS — M25612 Stiffness of left shoulder, not elsewhere classified: Secondary | ICD-10-CM | POA: Diagnosis not present

## 2017-01-17 DIAGNOSIS — M25512 Pain in left shoulder: Secondary | ICD-10-CM | POA: Diagnosis not present

## 2017-01-18 DIAGNOSIS — M50222 Other cervical disc displacement at C5-C6 level: Secondary | ICD-10-CM | POA: Diagnosis not present

## 2017-01-18 DIAGNOSIS — Z6828 Body mass index (BMI) 28.0-28.9, adult: Secondary | ICD-10-CM | POA: Diagnosis not present

## 2017-01-18 DIAGNOSIS — I10 Essential (primary) hypertension: Secondary | ICD-10-CM | POA: Diagnosis not present

## 2017-01-18 DIAGNOSIS — M5412 Radiculopathy, cervical region: Secondary | ICD-10-CM | POA: Diagnosis not present

## 2017-01-18 DIAGNOSIS — R51 Headache: Secondary | ICD-10-CM | POA: Diagnosis not present

## 2017-02-07 DIAGNOSIS — I10 Essential (primary) hypertension: Secondary | ICD-10-CM | POA: Diagnosis not present

## 2017-02-07 DIAGNOSIS — R51 Headache: Secondary | ICD-10-CM | POA: Diagnosis not present

## 2017-02-07 DIAGNOSIS — F439 Reaction to severe stress, unspecified: Secondary | ICD-10-CM | POA: Diagnosis not present

## 2017-02-07 DIAGNOSIS — M797 Fibromyalgia: Secondary | ICD-10-CM | POA: Diagnosis not present

## 2017-02-07 DIAGNOSIS — C50912 Malignant neoplasm of unspecified site of left female breast: Secondary | ICD-10-CM | POA: Diagnosis not present

## 2017-02-07 DIAGNOSIS — M5030 Other cervical disc degeneration, unspecified cervical region: Secondary | ICD-10-CM | POA: Diagnosis not present

## 2017-02-11 ENCOUNTER — Encounter: Payer: Self-pay | Admitting: Neurology

## 2017-02-11 ENCOUNTER — Ambulatory Visit (INDEPENDENT_AMBULATORY_CARE_PROVIDER_SITE_OTHER): Payer: Commercial Managed Care - HMO | Admitting: Neurology

## 2017-02-11 VITALS — BP 100/70 | HR 66 | Ht 64.0 in | Wt 172.5 lb

## 2017-02-11 DIAGNOSIS — G43709 Chronic migraine without aura, not intractable, without status migrainosus: Secondary | ICD-10-CM | POA: Diagnosis not present

## 2017-02-11 MED ORDER — TOPIRAMATE 50 MG PO TABS: 50.0000 mg | ORAL_TABLET | Freq: Every day | ORAL | 2 refills | Status: DC

## 2017-02-11 MED ORDER — SUMATRIPTAN SUCCINATE 100 MG PO TABS: ORAL_TABLET | ORAL | 2 refills | Status: DC

## 2017-02-11 NOTE — Patient Instructions (Addendum)
Migraine Recommendations: 1.  Start topiramate 50mg  at bedtime.  Call in 4 weeks with update and we can adjust dose if needed.  We will check baseline BMP. 2.  Take sumatriptan 100mg  at earliest onset of headache.  May repeat dose once in 2 hours if needed.  Do not exceed two tablets in 24 hours. 3.  Limit use of pain relievers to no more than 2 days out of the week.  These medications include acetaminophen, ibuprofen, triptans and narcotics.  This will help reduce risk of rebound headaches. 4.  Be aware of common food triggers such as processed sweets, processed foods with nitrites (such as deli meat, hot dogs, sausages), foods with MSG, alcohol (such as wine), chocolate, certain cheeses, certain fruits (dried fruits, some citrus fruit), vinegar, diet soda. 4.  Avoid caffeine 5.  Routine exercise 6.  Proper sleep hygiene 7.  Stay adequately hydrated with water 8.  Keep a headache diary. 9.  Maintain proper stress management. 10.  Do not skip meals. 11.  Consider supplements:  Magnesium citrate 400mg  to 600mg  daily, riboflavin 400mg , Coenzyme Q 10 100mg  three times daily 12.  Follow up in 3 months.   Migraine Headache A migraine headache is an intense, throbbing pain on one side or both sides of the head. Migraines may also cause other symptoms, such as nausea, vomiting, and sensitivity to light and noise. What are the causes? Doing or taking certain things may also trigger migraines, such as:  Alcohol.  Smoking.  Medicines, such as:  Medicine used to treat chest pain (nitroglycerine).  Birth control pills.  Estrogen pills.  Certain blood pressure medicines.  Aged cheeses, chocolate, or caffeine.  Foods or drinks that contain nitrates, glutamate, aspartame, or tyramine.  Physical activity. Other things that may trigger a migraine include:  Menstruation.  Pregnancy.  Hunger.  Stress, lack of sleep, too much sleep, or fatigue.  Weather changes. What increases the  risk? The following factors may make you more likely to experience migraine headaches:  Age. Risk increases with age.  Family history of migraine headaches.  Being Caucasian.  Depression and anxiety.  Obesity.  Being a woman.  Having a hole in the heart (patent foramen ovale) or other heart problems. What are the signs or symptoms? The main symptom of this condition is pulsating or throbbing pain. Pain may:  Happen in any area of the head, such as on one side or both sides.  Interfere with daily activities.  Get worse with physical activity.  Get worse with exposure to bright lights or loud noises. Other symptoms may include:  Nausea.  Vomiting.  Dizziness.  General sensitivity to bright lights, loud noises, or smells. Before you get a migraine, you may get warning signs that a migraine is developing (aura). An aura may include:  Seeing flashing lights or having blind spots.  Seeing bright spots, halos, or zigzag lines.  Having tunnel vision or blurred vision.  Having numbness or a tingling feeling.  Having trouble talking.  Having muscle weakness. How is this diagnosed? A migraine headache can be diagnosed based on:  Your symptoms.  A physical exam.  Tests, such as CT scan or MRI of the head. These imaging tests can help rule out other causes of headaches.  Taking fluid from the spine (lumbar puncture) and analyzing it (cerebrospinal fluid analysis, or CSF analysis). How is this treated? A migraine headache is usually treated with medicines that:  Relieve pain.  Relieve nausea.  Prevent migraines from  coming back. Treatment may also include:  Acupuncture.  Lifestyle changes like avoiding foods that trigger migraines. Follow these instructions at home: Medicines   Take over-the-counter and prescription medicines only as told by your health care provider.  Do not drive or use heavy machinery while taking prescription pain medicine.  To  prevent or treat constipation while you are taking prescription pain medicine, your health care provider may recommend that you:  Drink enough fluid to keep your urine clear or pale yellow.  Take over-the-counter or prescription medicines.  Eat foods that are high in fiber, such as fresh fruits and vegetables, whole grains, and beans.  Limit foods that are high in fat and processed sugars, such as fried and sweet foods. Lifestyle   Avoid alcohol use.  Do not use any products that contain nicotine or tobacco, such as cigarettes and e-cigarettes. If you need help quitting, ask your health care provider.  Get at least 8 hours of sleep every night.  Limit your stress. General instructions    Keep a journal to find out what may trigger your migraine headaches. For example, write down:  What you eat and drink.  How much sleep you get.  Any change to your diet or medicines.  If you have a migraine:  Avoid things that make your symptoms worse, such as bright lights.  It may help to lie down in a dark, quiet room.  Do not drive or use heavy machinery.  Ask your health care provider what activities are safe for you while you are experiencing symptoms.  Keep all follow-up visits as told by your health care provider. This is important. Contact a health care provider if:  You develop symptoms that are different or more severe than your usual migraine symptoms. Get help right away if:  Your migraine becomes severe.  You have a fever.  You have a stiff neck.  You have vision loss.  Your muscles feel weak or like you cannot control them.  You start to lose your balance often.  You develop trouble walking.  You faint. This information is not intended to replace advice given to you by your health care provider. Make sure you discuss any questions you have with your health care provider. Document Released: 10/18/2005 Document Revised: 05/07/2016 Document Reviewed:  04/05/2016 Elsevier Interactive Patient Education  2017 [employer].

## 2017-02-11 NOTE — Progress Notes (Addendum)
NEUROLOGY FOLLOW UP OFFICE NOTE  Victoria Delacruz [id number]  HISTORY OF PRESENT ILLNESS: Victoria Delacruz is a 46 year old left-handed woman with hypertension, fibromyalgia, and history of left breast invasive duct carcinoma on Tamoxifen, who presents for migraines.    Onset:  Many years but worse over past year Location:  Band-like, neck pain Quality:  pressure Intensity:  "11"/10 Aura:  no Prodrome:  no Postdrome:  no Associated symptoms:  Photophobia, phonophobia, sometimes nausea and vomiting.  She has not had any new worse headache of her life, waking up from sleep Duration:  3 hours Frequency:  3 to 4 times per week Frequency of abortive medication: 4 days a week Triggers/exacerbating factors:  stress Relieving factors:  Ice pack, heating pad Activity:  Aggravates.  Cannot function 10 days out of the month  Past NSAIDS:  Ibuprofen, naproxen Past analgesics:  Tylenol Past abortive triptans:  no Past muscle relaxants:  no Past anti-emetic:  no Past antihypertensive medications:  no Past antidepressant medications:  venlafaxine XR (caused weight loss), amitriptyline, citalopram, fluoxetine Past anticonvulsant medications:  no Past vitamins/Herbal/Supplements:  no Other past therapies:  no  Current NSAIDS:  meloxicam 15mg  Current analgesics:  Excedrin Current triptans:  no Current anti-emetic:  Zofran 4mg  Current muscle relaxants:  cyclobenzaprine Current anti-anxiolytic:  no Current sleep aide:  no Current Antihypertensive medications:  propranolol 20mg  twice daily, amlodipine Current Antidepressant medications:  Cymbalta 60mg  Current Anticonvulsant medications:  Lyrica 25mg  Current Vitamins/Herbal/Supplements:  B12 Current Antihistamines/Decongestants:  no Other therapy:  no Other medication:  Tamoxifen, ASA  Caffeine:  tea Alcohol:  no Smoker:  no Diet:  Increasing water Exercise:  no Depression/anxiety:  Increased: her husband has had two brain  surgeries to remove tumors; relationship with her foster daughter Sleep hygiene:  poor Family history of headache:  No  MRI of cervical spine on 11/26/16 personally reviewed and showed small left-sided disc protrusion at C5-6. CT head from 01/11/17 was personally reviewed and was normal.   PAST MEDICAL HISTORY: Past Medical History:  Diagnosis Date  . Anemia   . Arthritis   . Blood transfusion 05/17/11, 05/25/11   anemia  . Breast cancer ([place]) 2004 and 2011  . Depression   . Dizziness   . ECTOPIC PREGNANCY 1997 and 2011   x 2  . Family history of breast cancer    grandmother  . Fibroid   . Fibromyalgia   . Hypertension     MEDICATIONS: Current Outpatient Prescriptions on File Prior to Visit  Medication Sig Dispense Refill  . amLODipine (NORVASC) 5 MG tablet Take 1 tablet (5 mg total) by mouth daily. 90 tablet 3  . aspirin-acetaminophen-caffeine (EXCEDRIN MIGRAINE) 025-427-06 MG per tablet Take 1 tablet by mouth every 6 (six) hours as needed for headache.    . cholecalciferol (VITAMIN D) 1000 units tablet Take 5,000 Units by mouth daily.    . cyclobenzaprine (FLEXERIL) 10 MG tablet Take 1 tablet (10 mg total) by mouth at bedtime. 20 tablet 0  . DULoxetine (CYMBALTA) 60 MG capsule Take 60 mg by mouth 2 (two) times daily.  0  . ergocalciferol (VITAMIN D2) 50000 units capsule Take 1 capsule (50,000 Units total) by mouth once a week. Take 50,000 units weekly for  8  Weeks.   Then  2000 units  daily 8 capsule 0  . LYRICA 75 MG capsule take 1 capsule by mouth every morning and take 2 every evening  0  . meloxicam (MOBIC) 15 MG tablet Take  15 mg by mouth daily.  0  . polyethylene glycol powder (GLYCOLAX/MIRALAX) powder Take 17 g by mouth daily. (Patient taking differently: Take 17 g by mouth as needed. ) 3350 g 1  . potassium chloride SA (K-DUR,KLOR-CON) 20 MEQ tablet Take 1 tablet (20 mEq total) by mouth daily. (Patient not taking: Reported on 09/03/2016) 10 tablet 0  . propranolol  (INDERAL) 40 MG tablet Take 20 mg by mouth 2 (two) times daily.  0  . tamoxifen (NOLVADEX) 20 MG tablet Take 1 tablet (20 mg total) by mouth daily. 30 tablet 5  . Vitamin D, Cholecalciferol, 1000 units TABS Take 2,000 Units by mouth daily. Take  50,000 units weekly for  8  Weeks.   Then   2000 units  Daily. 90 tablet 1   No current facility-administered medications on file prior to visit.     ALLERGIES: No Known Allergies  FAMILY HISTORY: Family History  Problem Relation Age of Onset  . Hypertension Mother   . Hypertension Father     History of blood clots in lungs  . Lymphoma Sister     Hodgkins  . Cancer Sister   . Cancer Maternal Aunt     breast   . Cancer Maternal Uncle     prostate/ bone   . Cancer Maternal Grandmother     breast  . Cancer Maternal Grandfather     lung    SOCIAL HISTORY: Social History   Social History  . Marital status: Single    Spouse name: N/A  . Number of children: N/A  . Years of education: N/A   Occupational History  . Not on file.   Social History Main Topics  . Smoking status: Never Smoker  . Smokeless tobacco: Never Used  . Alcohol use No  . Drug use: No  . Sexual activity: Yes    Partners: Male    Birth control/ protection: None   Other Topics Concern  . Not on file   Social History Narrative  . No narrative on file    REVIEW OF SYSTEMS: Constitutional: No fevers, chills, or sweats, no generalized fatigue, change in appetite Eyes: No visual changes, double vision, eye pain Ear, nose and throat: No hearing loss, ear pain, nasal congestion, sore throat Cardiovascular: No chest pain, palpitations Respiratory:  No shortness of breath at rest or with exertion, wheezes GastrointestinaI: No nausea, vomiting, diarrhea, abdominal pain, fecal incontinence Genitourinary:  No dysuria, urinary retention or frequency Musculoskeletal:  Neck pain Integumentary: No rash, pruritus, skin lesions Neurological: as above Psychiatric: No  depression, insomnia, anxiety Endocrine: No palpitations, fatigue, diaphoresis, mood swings, change in appetite, change in weight, increased thirst Hematologic/Lymphatic:  No purpura, petechiae. Allergic/Immunologic: no itchy/runny eyes, nasal congestion, recent allergic reactions, rashes  PHYSICAL EXAM: Vitals:  BP 100/70, Pulse 66, SpO2 97% General: No acute distress.  Patient appears well-groomed.  normal body habitus. Head:  Normocephalic/atraumatic Eyes:  Fundi examined but not visualized Neck: supple, bilateral paraspinal tenderness, full range of motion Heart:  Regular rate and rhythm Lungs:  Clear to auscultation bilaterally Back: No paraspinal tenderness Neurological Exam: alert and oriented to person, place, and time. Attention span and concentration intact, recent and remote memory intact, fund of knowledge intact.  Speech fluent and not dysarthric, language intact.  CN II-XII intact. Bulk and tone normal, muscle strength 5/5 throughout.  Sensation to light touch, temperature and vibration intact.  Deep tendon reflexes 2+ throughout, toes downgoing.  Finger to nose and heel to shin testing  intact.  Gait normal, Romberg negative.  IMPRESSION: Chronic migraine Cervicalgia  PLAN: 1.  Start topiramate 50mg  at bedtime.  Call in 4 weeks with update and we can adjust dose if needed.  We will check baseline BMP. 2.  Take sumatriptan 100mg  at earliest onset of headache.  May repeat dose once in 2 hours if needed.  Do not exceed two tablets in 24 hours.  Cyclobenzaprine at night for neck pain. 3.  Limit use of pain relievers to no more than 2 days out of the week.  These medications include acetaminophen, ibuprofen, triptans and narcotics.  This will help reduce risk of rebound headaches. 4.  Be aware of common food triggers such as processed sweets, processed foods with nitrites (such as deli meat, hot dogs, sausages), foods with MSG, alcohol (such as wine), chocolate, certain cheeses,  certain fruits (dried fruits, some citrus fruit), vinegar, diet soda. 4.  Avoid caffeine 5.  Routine exercise 6.  Proper sleep hygiene 7.  Stay adequately hydrated with water 8.  Keep a headache diary. 9.  Maintain proper stress management. 10.  Do not skip meals. 11.  Consider supplements:  Magnesium citrate 400mg  to 600mg  daily, riboflavin 400mg , Coenzyme Q 10 100mg  three times daily 12.  Follow up in 3 months.  27 minutes spent face to face with patient, over 50% spent discussing headache, MRI result and management.  Metta Clines, DO  CC: Harlan Stains, MD

## 2017-02-15 ENCOUNTER — Telehealth: Payer: Self-pay | Admitting: Neurology

## 2017-02-15 MED ORDER — TOPIRAMATE 50 MG PO TABS: 50.0000 mg | ORAL_TABLET | Freq: Every day | ORAL | 2 refills | Status: DC

## 2017-02-15 NOTE — Telephone Encounter (Signed)
Patient states that we were to call in her topamax but the [facility] never got it on Friday so she would like [place] to call it back into them again

## 2017-02-15 NOTE — Telephone Encounter (Signed)
Spoke to patient. Redirected Topiramate 50 Rx to [facility]. Removed other pharmacies on file as requested.

## 2017-03-01 ENCOUNTER — Other Ambulatory Visit (HOSPITAL_COMMUNITY): Payer: Self-pay | Admitting: Neurosurgery

## 2017-03-01 DIAGNOSIS — M50222 Other cervical disc displacement at C5-C6 level: Secondary | ICD-10-CM

## 2017-03-02 ENCOUNTER — Other Ambulatory Visit: Payer: Self-pay | Admitting: Neurosurgery

## 2017-03-02 NOTE — Progress Notes (Signed)
[facility] OFFICE PROGRESS NOTE  [name], MD [place] [id number]  DIAGNOSIS: Malignant neoplasm of upper-outer quadrant of left breast in female, estrogen receptor positive ([place]) - Plan: CBC with Differential, Comprehensive metabolic panel, Ambulatory referral to Genetics  CHIEF COMPLAIN: follow up breast cancer     Malignant neoplasm of female breast ([place])   08/02/2003 Initial Diagnosis    MALIGNANT NEOPLASM OF BREAST UNSPECIFIED SITE. High-grade DCIS of Left breast; Tumor was 4.0 cm. Sentinel lymph nodes were negative.  ER, 24%; PR 2%.  Patient had saline implant.       08/02/2003 Surgery    L mastectomy with reconstruction [name]      05/07/2010 Surgery    R port-a-cath placement      05/11/2010 Relapse/Recurrence    Invasive ductal carcinoma of L breast; ER: 99%, positive; PR: 53%, positive; Ki 67 (mib-1): 20%; hER@ neu by FISH without ampliication; ratio of her 2 CEP 17 was 1.17.       05/28/2010 Surgery    Local excision of the L invasive ductal carcinoma by [name].      06/08/2010 - 08/10/2010 Chemotherapy    Completed 4 cycles of adjuvant chemotherapy with cytoxan and taxotere in combination with neulasta       09/08/2010 - 11/04/2010 Radiation Therapy    XRT was given to the left breast and chall wall consisting of 4780 in 26 fractions with an 1800 cGy boost in 9 fractions Dr Valere Dross.      11/04/2010 - 04/17/2011 Chemotherapy    Tamoxifen taken sporadically and discontinued due to vaginal bleeding.       08/30/2011 Surgery    Laparoscopic assisted vaginal hysterectomy.      04/10/2012 Imaging    MRI of the brain normal.  No cause of the patient's headache identified.       12/17/2012 Imaging    CT scan of abdomen and pelvis showed no acute findings in the abdomen or pelvis.  There were felt to be hepatic hemangiomas present, mesuring 2.5 cm in the superior right heaptic lobe at the hepatic dome and a 7 mm  lesion located anteriorly.       08/23/2013 Imaging    Digital diagnostic unilateral right mammogram showed no mamographic or sonographic evidence of malignancy, right breast.         09/11/2013 -  Anti-estrogen oral therapy    Tamoxifen 20 mg once daily, planning for 10 years.      11/26/2016 Imaging    MR Cervical Spine W WO Contrast IMPRESSION: Small left sided disc protrusion at C5-6 extending into the foramen. This could be a source of radiculopathy involving the left C6 nerve root. Diffusely abnormal bone marrow likely related to prior chemotherapy or anemia.      CURRENT THERAPY: Tamoxifen started in 11/2009, held 04/2011-08/2013, and restarted on 09/11/2013   INTERIM HISTORY: Victoria Delacruz returns for follow-up. She is accompanied by her son today. The patient reports she is doing well today. She reports concerns about IBS ongoing; she is experiencing constipation and "really bad pains" to her right flank and abdomen. She reports she actually does not think this pain is related to her IBS. This pain is "constant" and cramping, and is sometimes accompanied by nausea. The patient's son reports this has been ongoing, and is not a new onset of symptoms.   The patient had a colonoscopy because of constipation and "bleeding when I would go to  the bathroom." She does not know that date of this procedure.   She also reports "really bad pain all over." The patient reports 2 ruptured disks "in the neck area." She has consulted with a surgeon on this, who has not offered her surgery at this time.    MEDICAL HISTORY: Past Medical History:  Diagnosis Date  . Anemia   . Arthritis   . Blood transfusion 05/17/11, 05/25/11   anemia  . Breast cancer ([place]) 2004 and 2011  . Depression   . Dizziness   . ECTOPIC PREGNANCY 1997 and 2011   x 2  . Family history of breast cancer    grandmother  . Fibroid   . Fibromyalgia   . Hypertension     ALLERGIES:  has No Known Allergies.  MEDICATIONS:  has a current medication list which includes the following prescription(s): amlodipine, aspirin-acetaminophen-caffeine, cholecalciferol, cyclobenzaprine, duloxetine, lyrica, meloxicam, polyethylene glycol powder, propranolol, tamoxifen, and potassium chloride sa.  SURGICAL HISTORY:  Past Surgical History:  Procedure Laterality Date  . LAPAROSCOPIC ASSISTED VAGINAL HYSTERECTOMY  08/30/2011   Procedure: LAPAROSCOPIC ASSISTED VAGINAL HYSTERECTOMY;  Surgeon: Osborne Oman, MD;  Location: [facility];  Service: Gynecology;  Laterality: N/A;  . LAPAROSCOPY FOR ECTOPIC PREGNANCY    . LAPAROSCOPY W/ MINI-LAPAROTOMY    . MASTECTOMY  2004   complete with reconstruction x 3, no b/p punctures to left arm  . RECONSTRUCTION BREAST IMMEDIATE / DELAYED W/ TISSUE EXPANDER  2004    REVIEW OF SYSTEMS:   Constitutional: Denies fevers, chills or abnormal weight loss; (+) hot flashes, persistent headaches Eyes: Denies blurriness of vision Ears, nose, mouth, throat, and face: Denies mucositis or sore throat Respiratory: Denies cough, dyspnea or wheezes Cardiovascular: Denies palpitation, chest discomfort or lower extremity swelling Gastrointestinal:  Denies nausea, heartburn or change in bowel habits Skin: Denies abnormal skin rashes Musculoskeletal: (+) chronic body pain, neck and right shoulder pain Lymphatics: Denies new lymphadenopathy or easy bruising Neurological:Denies numbness, tingling or new weaknesses Behavioral/Psych: Mood is stable, no new changes  All other systems were reviewed with the patient and are negative.   PHYSICAL EXAMINATION: ECOG PERFORMANCE STATUS: 1  Blood pressure 133/89, pulse (!) 57, height _0  (1.626 m), weight 174 lb 1.6 oz (79 kg), last menstrual period 04/23/2011, SpO2 100 %. Vitals:   03/03/17 0847  BP: 133/89  Pulse: (!) 57   GENERAL:alert, no distress and comfortable; NAD. SKIN: skin color, texture, turgor are normal, no rashes or significant lesions EYES:  normal, Conjunctiva are pink and non-injected, sclera clear OROPHARYNX:no exudate, no erythema and lips, buccal mucosa, and tongue normal  NECK: supple, thyroid normal size, non-tender, without nodularity LYMPH:  no palpable lymphadenopathy in the cervical, axillary or supraclavicular BREASTS: right breast no mass, no skin changes, no nipple changes, left reconstructed breast no skin changes, no adenopathy on either side. No tenderness. LUNGS: clear to auscultation with normal breathing effort, no wheezes or rhonchi HEART: regular rate & rhythm and no murmurs and no lower extremity edema ABDOMEN:abdomen soft, mild tenderness in the right upper quadrant, which is chronic, no other tenderness and normal bowel sounds, no organomegaly  Musculoskeletal:no cyanosis of digits and no clubbing  NEURO: alert & oriented x 3 with fluent speech, no focal motor/sensory deficits   Labs:  CBC Latest Ref Rng & Units 03/03/2017 09/03/2016 03/04/2016  WBC 3.9 - 10.3 10e3/uL 6.8 6.9 6.3  Hemoglobin 11.6 - 15.9 g/dL 13.0 13.0 12.9  Hematocrit 34.8 - 46.6 % 38.2 38.2 38.4  Platelets 145 - 400 10e3/uL 256 299 250    CMP Latest Ref Rng & Units 03/03/2017 09/03/2016 03/04/2016  Glucose 70 - 140 mg/dl 108 104 98  BUN 7.0 - 26.0 mg/dL 10.8 9.1 6.4(L)  Creatinine 0.6 - 1.1 mg/dL 0.9 0.8 0.9  Sodium 136 - 145 mEq/L 141 141 139  Potassium 3.5 - 5.1 mEq/L 3.6 3.4(L) 3.4(L)  Chloride 96 - 112 mEq/L - - -  CO2 22 - 29 mEq/L _0 Calcium 8.4 - 10.4 mg/dL 9.3 8.8 8.9  Total Protein 6.4 - 8.3 g/dL 7.9 7.5 7.3  Total Bilirubin 0.20 - 1.20 mg/dL 0.46 0.35 0.33  Alkaline Phos 40 - 150 U/L 92 97 88  AST 5 - 34 U/L _1 ALT 0 - 55 U/L _2 RADIOGRAPHIC STUDIES:  CT Head WO Contrast: IMPRESSION: Normal CT of the head for age.  MR Cervical Spine W WO Contrast 11/26/16 IMPRESSION: Small left sided disc protrusion at C5-6 extending into the foramen. This could be a source of radiculopathy involving the left  C6 nerve root. Diffusely abnormal bone marrow likely related to prior chemotherapy or anemia.  Mammogram digital screening right 11/04/2015 IMPRESSION: No mammographic evidence of malignancy. A result letter of this screening mammogram will be mailed directly to the patient.  RECOMMENDATION: Screening mammogram in one year. (Code:SM-B-01Y)  BI-RADS CATEGORY 1: Negative.   ASSESSMENT: Victoria Delacruz 46 y.o. female with a history of Malignant neoplasm of upper-outer quadrant of left breast in female, estrogen receptor positive ([place]) - Plan: CBC with Differential, Comprehensive metabolic panel, Ambulatory referral to Genetics   PLAN:   1. Left breast invasive duct carcinoma (ER+,PR+,HER2 negative) , [id number], stage Ia diagnosed in 2011, with prior left DCIS in 2004 - She is tolerating the adjuvant tamoxifen well. We'll continue tamoxifen for total of 10 years until 2023 -Her hot flash has been stable and manageable, no other significant side effects from tamoxifen -She had a hysterectomy, no risk of tamoxifen-related endometrial cancer. -She is clinically doing well, lab and exam are unremarkable, no clinical concern of recurrence. -She will do annual mammogram.  -Her chronic body pain and abdominal pain are unlikely related to her breast cancer, but her abdominal pain has been getting worse lately, I'll obtain a abdominal ultrasound to further evaluate liver . She has a unremarkable abdominal CT scan in June 2015. -I have encouraged her to eat healthy and exercise regularly, and try to lose some weight. She has gained about 20 pounds in the past 2 years. - The patient is overdue for a mammogram. She agreed to schedule this soon.  2. Genetics  -She is young and had recurrent breast cancer. She states that she had genetic test after she was diagnosed with breast cancer first time. - Patient is eligible for updated genetic testing; she is interested in these services. I will refer  her.  3. HTN, Fibromyalgia, chronic body pain  -She follows up with her primary care physician.  4. Hypokalemia -Her potassium is 3.6 today, improved, I encouraged her to take potassium enriched food or a potassium supplement  5. Right sided headaches with blurry vision bilaterally -Her chronic headaches are unlikely related to her breast cancer, she had an unremarkable Brain MRI in June 2013 -She has history of migraine headache -She will follow up with her primary care physician.  6. Abdominal and Back pain - The patient reports significant pain to her right flank  and back. This is not a new onset. - Patient does have IBS, to which she attributes some of her discomfort to constipation and IBS. - The patient will undergo [place] to rule out gallstones and liver abnormality.   Plan - Available labs reviewed today with patient. Kidney Liver functions tests pending; I will review these when available. - Refill Tamoxifen today. - Patient is overdue for mammogram. She will schedule this. - [place] of liver and gall bladder for abdominal pain. She will call me after her [place] to review the result  - Refer back to Dr. Dorathy Daft for evaluation of her IBS and constipation. - Refer for updated genetics testing. - Follow up in 3 months for close follow up.   I spent 20 minutes counseling the patient face to face. The total time spent in the appointment was 25 minutes.  This document serves as a record of services personally performed by Truitt Merle, MD. It was created on her behalf by Maryla Morrow, a trained medical scribe. The creation of this record is based on the scribe's personal observations and the provider's statements to them. This document has been checked and approved by the attending provider.   Truitt Merle  03/03/2017

## 2017-03-03 ENCOUNTER — Encounter: Payer: Self-pay | Admitting: Hematology

## 2017-03-03 ENCOUNTER — Ambulatory Visit (HOSPITAL_BASED_OUTPATIENT_CLINIC_OR_DEPARTMENT_OTHER): Payer: Commercial Managed Care - HMO | Admitting: Hematology

## 2017-03-03 ENCOUNTER — Telehealth: Payer: Self-pay | Admitting: Hematology

## 2017-03-03 ENCOUNTER — Other Ambulatory Visit (HOSPITAL_BASED_OUTPATIENT_CLINIC_OR_DEPARTMENT_OTHER): Payer: Commercial Managed Care - HMO

## 2017-03-03 VITALS — BP 133/89 | HR 57 | Ht 64.0 in | Wt 174.1 lb

## 2017-03-03 DIAGNOSIS — C50912 Malignant neoplasm of unspecified site of left female breast: Secondary | ICD-10-CM | POA: Diagnosis not present

## 2017-03-03 DIAGNOSIS — Z170 Estrogen receptor positive status [ER+]: Secondary | ICD-10-CM | POA: Diagnosis not present

## 2017-03-03 DIAGNOSIS — E876 Hypokalemia: Secondary | ICD-10-CM

## 2017-03-03 DIAGNOSIS — M797 Fibromyalgia: Secondary | ICD-10-CM | POA: Diagnosis not present

## 2017-03-03 DIAGNOSIS — I10 Essential (primary) hypertension: Secondary | ICD-10-CM

## 2017-03-03 DIAGNOSIS — Z79810 Long term (current) use of selective estrogen receptor modulators (SERMs): Secondary | ICD-10-CM | POA: Diagnosis not present

## 2017-03-03 DIAGNOSIS — C50412 Malignant neoplasm of upper-outer quadrant of left female breast: Secondary | ICD-10-CM

## 2017-03-03 LAB — CBC WITH DIFFERENTIAL/PLATELET
BASO%: 0.6 % (ref 0.0–2.0)
Basophils Absolute: 0 10*3/uL (ref 0.0–0.1)
EOS%: 1.9 % (ref 0.0–7.0)
Eosinophils Absolute: 0.1 10*3/uL (ref 0.0–0.5)
HCT: 38.2 % (ref 34.8–46.6)
HGB: 13 g/dL (ref 11.6–15.9)
LYMPH%: 39 % (ref 14.0–49.7)
MCH: 30.3 pg (ref 25.1–34.0)
MCHC: 34 g/dL (ref 31.5–36.0)
MCV: 89 fL (ref 79.5–101.0)
MONO#: 0.3 10*3/uL (ref 0.1–0.9)
MONO%: 5 % (ref 0.0–14.0)
NEUT#: 3.7 10*3/uL (ref 1.5–6.5)
NEUT%: 53.5 % (ref 38.4–76.8)
Platelets: 256 10*3/uL (ref 145–400)
RBC: 4.29 10*6/uL (ref 3.70–5.45)
RDW: 13.6 % (ref 11.2–14.5)
WBC: 6.8 10*3/uL (ref 3.9–10.3)
lymph#: 2.7 10*3/uL (ref 0.9–3.3)

## 2017-03-03 LAB — COMPREHENSIVE METABOLIC PANEL
ALT: 16 U/L (ref 0–55)
AST: 22 U/L (ref 5–34)
Albumin: 3.9 g/dL (ref 3.5–5.0)
Alkaline Phosphatase: 92 U/L (ref 40–150)
Anion Gap: 7 mEq/L (ref 3–11)
BUN: 10.8 mg/dL (ref 7.0–26.0)
CO2: 24 mEq/L (ref 22–29)
Calcium: 9.3 mg/dL (ref 8.4–10.4)
Chloride: 110 mEq/L — ABNORMAL HIGH (ref 98–109)
Creatinine: 0.9 mg/dL (ref 0.6–1.1)
EGFR: >90 mL/min/{1.73_m2} (ref >90)
Glucose: 108 mg/dl (ref 70–140)
Potassium: 3.6 mEq/L (ref 3.5–5.1)
Sodium: 141 mEq/L (ref 136–145)
Total Bilirubin: 0.46 mg/dL (ref 0.20–1.20)
Total Protein: 7.9 g/dL (ref 6.4–8.3)

## 2017-03-03 MED ORDER — TAMOXIFEN CITRATE 20 MG PO TABS: 20.0000 mg | ORAL_TABLET | Freq: Every day | ORAL | 5 refills | Status: DC

## 2017-03-03 NOTE — Telephone Encounter (Signed)
Appointments scheduled per 5.3.18 LOS. Patient given AVS report and calendars with future scheduled appointments. °

## 2017-03-05 ENCOUNTER — Encounter: Payer: Self-pay | Admitting: Hematology

## 2017-03-09 ENCOUNTER — Ambulatory Visit (HOSPITAL_COMMUNITY)
Admission: RE | Admit: 2017-03-09 | Discharge: 2017-03-09 | Disposition: A | Discharge status: Home / Self Care | Payer: Commercial Managed Care - HMO | Source: Ambulatory Visit | Attending: Neurosurgery | Admitting: Neurosurgery

## 2017-03-09 DIAGNOSIS — M50123 Cervical disc disorder at C6-C7 level with radiculopathy: Secondary | ICD-10-CM | POA: Diagnosis not present

## 2017-03-09 DIAGNOSIS — M50222 Other cervical disc displacement at C5-C6 level: Secondary | ICD-10-CM

## 2017-03-09 DIAGNOSIS — M50221 Other cervical disc displacement at C4-C5 level: Secondary | ICD-10-CM | POA: Diagnosis not present

## 2017-03-09 MED ORDER — DIAZEPAM 5 MG PO TABS
10.0000 mg | ORAL_TABLET | Freq: Once | ORAL | Status: AC
  Administered 2017-03-09: 10 mg via ORAL
  Filled 2017-03-09: qty 2

## 2017-03-09 MED ORDER — LIDOCAINE HCL (PF) 1 % IJ SOLN
5.0000 mL | Freq: Once | INTRAMUSCULAR | Status: AC
  Administered 2017-03-09: 5 mL via INTRADERMAL

## 2017-03-09 MED ORDER — OXYCODONE HCL 5 MG PO TABS
5.0000 mg | ORAL_TABLET | ORAL | Status: DC | PRN
  Administered 2017-03-09: 5 mg via ORAL
  Filled 2017-03-09: qty 2

## 2017-03-09 MED ORDER — IOPAMIDOL (ISOVUE-M 300) INJECTION 61%
15.0000 mL | Freq: Once | INTRAMUSCULAR | Status: AC | PRN
  Administered 2017-03-09: 12 mL via INTRATHECAL

## 2017-03-09 MED ORDER — DIAZEPAM 5 MG PO TABS
ORAL_TABLET | ORAL | Status: AC
  Filled 2017-03-09: qty 2

## 2017-03-09 MED ORDER — OXYCODONE HCL 5 MG PO TABS
ORAL_TABLET | ORAL | Status: AC
  Filled 2017-03-09: qty 1

## 2017-03-09 MED ORDER — ONDANSETRON HCL 4 MG/2ML IJ SOLN: 4.0000 mg | Freq: Four times a day (QID) | INTRAMUSCULAR | Status: DC | PRN

## 2017-03-09 NOTE — Discharge Instructions (Signed)
Myelogram, Care After °These instructions give you information about caring for yourself after your procedure. Your doctor may also give you more specific instructions. Call your doctor if you have any problems or questions after your procedure. °Follow these instructions at home: °· Drink enough fluid to keep your pee (urine) clear or pale yellow. °· Rest as told by your doctor. °· Lie flat with your head slightly raised (elevated). °· Do not bend, lift, or do any hard activities for 24-48 hours or as told by your doctor. °· Take over-the-counter and prescription medicines only as told by your doctor. °· Take care of and remove your bandage (dressing) as told by your doctor. °· Bathe or shower as told by your doctor. °Contact a health care provider if: °· You have a fever. °· You have a headache that lasts longer than 24 hours. °· You feel sick to your stomach (nauseous). °· You throw up (vomit). °· Your neck is stiff. °· Your legs feel numb. °· You cannot pee. °· You cannot poop (have a bowel movement). °· You have a rash. °· You are itchy or sneezing. °Get help right away if: °· You have new symptoms or your symptoms get worse. °· You have a seizure. °· You have trouble breathing. °This information is not intended to replace advice given to you by your health care provider. Make sure you discuss any questions you have with your health care provider. °Document Released: 07/27/2008 Document Revised: 06/17/2016 Document Reviewed: 07/31/2015 °Elsevier Interactive Patient Education © 2017 [employer] ° °

## 2017-03-09 NOTE — Op Note (Signed)
03/09/2017 Cervical Myelogram  PATIENT:  Victoria Delacruz is a 46 y.o. female with cervical pain and radiculopathy  PRE-OPERATIVE DIAGNOSIS:  Cervical radiculopathy  POST-OPERATIVE DIAGNOSIS:  Cervical radiculopathy  PROCEDURE:  Lumbar Myelogram  SURGEON:  Megha Agnes  ANESTHESIA:   local LOCAL MEDICATIONS USED:  LIDOCAINE  Procedure Note: Victoria Delacruz is a 46 y.o. female Was taken to the fluoroscopy suite and  positioned prone on the fluoroscopy table. Her back was prepared and draped in a sterile manner. I infiltrated 5 cc into the lumbar region. I then introduced a spinal needle into the thecal sac at the L3/4 interlaminar space. I infiltrated 10cc of Isovue 300 into the thecal sac. Fluoroscopy showed the needle and contrast in the thecal sac. Victoria Delacruz tolerated the procedure well. she Will be taken to CT for evaluation.     PATIENT DISPOSITION:  Short Stay

## 2017-03-17 ENCOUNTER — Ambulatory Visit (HOSPITAL_COMMUNITY)
Admission: RE | Admit: 2017-03-17 | Discharge: 2017-03-17 | Disposition: A | Discharge status: Home / Self Care | Payer: Commercial Managed Care - HMO | Source: Ambulatory Visit | Attending: Hematology | Admitting: Hematology

## 2017-03-17 DIAGNOSIS — Z170 Estrogen receptor positive status [ER+]: Secondary | ICD-10-CM | POA: Insufficient documentation

## 2017-03-17 DIAGNOSIS — C50412 Malignant neoplasm of upper-outer quadrant of left female breast: Secondary | ICD-10-CM

## 2017-03-17 DIAGNOSIS — R109 Unspecified abdominal pain: Secondary | ICD-10-CM | POA: Diagnosis not present

## 2017-03-21 ENCOUNTER — Telehealth: Payer: Self-pay | Admitting: *Deleted

## 2017-03-21 NOTE — Telephone Encounter (Signed)
-----   Message from Truitt Merle, MD sent at 03/20/2017 10:58 PM EDT ----- Please let pt know the [place] result, thanks.  Truitt Merle  03/20/2017

## 2017-03-21 NOTE — Telephone Encounter (Signed)
Called pt & informed of good [place] result per Dr Ernestina Penna instructions.  Pt reports cont to have sharp pains in abd that occur on average every other day, mainly at hs & last 2-3 mins & is worse on L side of abd. She is perplexed as to what is causing this although glad [place] report is good.  Message routed to Dr Burr Medico.

## 2017-03-22 DIAGNOSIS — M50222 Other cervical disc displacement at C5-C6 level: Secondary | ICD-10-CM | POA: Diagnosis not present

## 2017-03-22 DIAGNOSIS — M5412 Radiculopathy, cervical region: Secondary | ICD-10-CM | POA: Diagnosis not present

## 2017-03-22 NOTE — Telephone Encounter (Signed)
I spoke with pt, she has had abdominal pain for several years, CT abdomen and pelvis in 2015 was negative. She sees GI Dr. Earlean Shawl, colonoscopy was negative last year, she has history of IBS and fibromyalgia. I encouraged her to follow-up with Dr. Earlean Shawl, she will call for appointment.  Truitt Merle MD

## 2017-03-29 ENCOUNTER — Encounter: Payer: Commercial Managed Care - HMO | Admitting: Genetics

## 2017-05-09 DIAGNOSIS — Z0000 Encounter for general adult medical examination without abnormal findings: Secondary | ICD-10-CM | POA: Diagnosis not present

## 2017-05-09 DIAGNOSIS — M5030 Other cervical disc degeneration, unspecified cervical region: Secondary | ICD-10-CM | POA: Diagnosis not present

## 2017-05-09 DIAGNOSIS — I10 Essential (primary) hypertension: Secondary | ICD-10-CM | POA: Diagnosis not present

## 2017-05-09 DIAGNOSIS — E559 Vitamin D deficiency, unspecified: Secondary | ICD-10-CM | POA: Diagnosis not present

## 2017-05-09 DIAGNOSIS — M797 Fibromyalgia: Secondary | ICD-10-CM | POA: Diagnosis not present

## 2017-05-19 ENCOUNTER — Ambulatory Visit: Payer: Self-pay | Admitting: Neurology

## 2017-05-19 DIAGNOSIS — Z029 Encounter for administrative examinations, unspecified: Secondary | ICD-10-CM

## 2017-05-20 ENCOUNTER — Encounter: Payer: Self-pay | Admitting: Neurology

## 2017-06-02 ENCOUNTER — Other Ambulatory Visit: Payer: Commercial Managed Care - HMO

## 2017-06-02 ENCOUNTER — Ambulatory Visit: Payer: Commercial Managed Care - HMO | Admitting: Hematology

## 2017-06-03 ENCOUNTER — Telehealth: Payer: Self-pay | Admitting: Hematology

## 2017-06-03 NOTE — Telephone Encounter (Signed)
Scheduled appt per sch message from 8/2 - left message for patient with appt date and time and sent reminder letter in the mail.

## 2017-06-13 ENCOUNTER — Ambulatory Visit: Payer: Medicare Other | Admitting: Hematology

## 2017-06-13 ENCOUNTER — Other Ambulatory Visit: Payer: Medicare Other

## 2017-06-16 ENCOUNTER — Other Ambulatory Visit: Payer: Self-pay | Admitting: Hematology

## 2017-06-16 DIAGNOSIS — Z1231 Encounter for screening mammogram for malignant neoplasm of breast: Secondary | ICD-10-CM

## 2017-06-16 NOTE — Progress Notes (Signed)
[facility] OFFICE PROGRESS NOTE  Harlan Stains, MD [address] [place] [address]  DIAGNOSIS: Malignant neoplasm of upper-outer quadrant of left breast in female, estrogen receptor positive ([place])  CHIEF COMPLAIN: follow up breast cancer     Malignant neoplasm of female breast ([facility])   08/02/2003 Initial Diagnosis    MALIGNANT NEOPLASM OF BREAST UNSPECIFIED SITE. High-grade DCIS of Left breast; Tumor was 4.0 cm. Sentinel lymph nodes were negative.  ER, 24%; PR 2%.  Patient had saline implant.       08/02/2003 Surgery    L mastectomy with reconstruction [name]      05/07/2010 Surgery    R port-a-cath placement      05/11/2010 Relapse/Recurrence    Invasive ductal carcinoma of L breast; ER: 99%, positive; PR: 53%, positive; Ki 67 (mib-1): 20%; hER@ neu by FISH without ampliication; ratio of her 2 CEP 17 was 1.17.       05/28/2010 Surgery    Local excision of the L invasive ductal carcinoma by [name].      06/08/2010 - 08/10/2010 Chemotherapy    Completed 4 cycles of adjuvant chemotherapy with cytoxan and taxotere in combination with neulasta       09/08/2010 - 11/04/2010 Radiation Therapy    XRT was given to the left breast and chall wall consisting of 4780 in 26 fractions with an 1800 cGy boost in 9 fractions Dr Valere Dross.      11/04/2010 - 04/17/2011 Chemotherapy    Tamoxifen taken sporadically and discontinued due to vaginal bleeding.       08/30/2011 Surgery    Laparoscopic assisted vaginal hysterectomy.      04/10/2012 Imaging    MRI of the brain normal.  No cause of the patient's headache identified.       12/17/2012 Imaging    CT scan of abdomen and pelvis showed no acute findings in the abdomen or pelvis.  There were felt to be hepatic hemangiomas present, mesuring 2.5 cm in the superior right heaptic lobe at the hepatic dome and a 7 mm lesion located anteriorly.       08/23/2013 Imaging    Digital diagnostic unilateral right  mammogram showed no mamographic or sonographic evidence of malignancy, right breast.         09/11/2013 -  Anti-estrogen oral therapy    Tamoxifen 20 mg once daily, planning for 10 years.      11/26/2016 Imaging    MR Cervical Spine W WO Contrast IMPRESSION: Small left sided disc protrusion at C5-6 extending into the foramen. This could be a source of radiculopathy involving the left C6 nerve root. Diffusely abnormal bone marrow likely related to prior chemotherapy or anemia.      03/17/2017 Imaging    US Abdomen IMPRESSION: No acute or focal abnormality identified.       03/17/2017 Imaging    US Abdomen Complete  IMPRESSION: No acute or focal abnormality identified.      CURRENT THERAPY:Tamoxife n started in 11/2009, held 04/2011-08/2013, and restarted on 09/11/2013   INTERIM HISTORY: Victoria Delacruz returns for follow-up. She is doing well overall. She still has some abdominal cramps, overall tolerable and stable, normal BM.  does not have an appointment with her GI Dr. Watt Climes, who recently moved to out of her insurance network. The patient does not feel an appointment with GI is necessary at this time but, will request a referral if symptoms worsen. She denies any complaints, other than her  stomach issues. She has no concerns at this time. She does not need any refills at this time.    MEDICAL HISTORY: Past Medical History:  Diagnosis Date  . Anemia   . Arthritis   . Blood transfusion 05/17/11, 05/25/11   anemia  . Breast cancer ([place] [facility]) 2004 and 2011  . Depression   . Dizziness   . ECTOPIC PREGNANCY 1997 and 2011   x 2  . Family history of breast cancer    grandmother  . Fibroid   . Fibromyalgia   . Hypertension     ALLERGIES:  has No Known Allergies.  MEDICATIONS: has a current medication list which includes the following prescription(s): amlodipine, aspirin-acetaminophen-caffeine, cholecalciferol, cyclobenzaprine, duloxetine, lyrica, meloxicam, polyethylene glycol powder,  propranolol, tamoxifen, and potassium chloride sa.  SURGICAL HISTORY:  Past Surgical History:  Procedure Laterality Date  . LAPAROSCOPIC ASSISTED VAGINAL HYSTERECTOMY  08/30/2011   Procedure: LAPAROSCOPIC ASSISTED VAGINAL HYSTERECTOMY;  Surgeon: Osborne Oman, MD;  Location: [facility];  Service: Gynecology;  Laterality: N/A;  . LAPAROSCOPY FOR ECTOPIC PREGNANCY    . LAPAROSCOPY W/ MINI-LAPAROTOMY    . MASTECTOMY  2004   complete with reconstruction x 3, no b/p punctures to left arm  . RECONSTRUCTION BREAST IMMEDIATE / DELAYED W/ TISSUE EXPANDER  2004    REVIEW OF SYSTEMS:   Constitutional: Denies fevers, chills or abnormal weight loss; (+) hot flashes, persistent headaches Eyes: Denies blurriness of vision Ears, nose, mouth, throat, and face: Denies mucositis or sore throat Respiratory: Denies cough, dyspnea or wheezes Cardiovascular: Denies palpitation, chest discomfort or lower extremity swelling Gastrointestinal:  Denies nausea, heartburn or change in bowel habits Skin: Denies abnormal skin rashes Musculoskeletal: (+) chronic body pain, neck and right shoulder pain Lymphatics: Denies new lymphadenopathy or easy bruising Neurological:Denies numbness, tingling or new weaknesses Behavioral/Psych: Mood is stable, no new changes  All other systems were reviewed with the patient and are negative.   PHYSICAL EXAMINATION: ECOG PERFORMANCE STATUS: 1  Blood pressure 127/75, pulse 73, temperature 98.2 F (36.8 C), temperature source Oral, resp. rate 18, height 5' 3"  (1.6 m), weight 169 lb 1.6 oz (76.7 kg), last menstrual period 04/23/2011, SpO2 100 %. Vitals:   06/22/17 0830  BP: 127/75  Pulse: 73  Resp: 18  Temp: 98.2 F (36.8 C)  SpO2: 100%   GENERAL:alert, no distress and comfortable; NAD. SKIN: skin color, texture, turgor are normal, no rashes or significant lesions EYES: normal, Conjunctiva are pink and non-injected, sclera clear OROPHARYNX:no exudate, no erythema and lips,  buccal mucosa, and tongue normal  NECK: supple, thyroid normal size, non-tender, without nodularity LYMPH:  no palpable lymphadenopathy in the cervical, axillary or supraclavicular BREASTS: right breast no mass, no skin changes, no nipple changes, left reconstructed breast no skin changes, no adenopathy on either side. No tenderness. LUNGS: clear to auscultation with normal breathing effort, no wheezes or rhonchi HEART: regular rate & rhythm and no murmurs and no lower extremity edema ABDOMEN:abdomen soft, mild tenderness in the right upper quadrant, which is chronic, no other tenderness and normal bowel sounds, no organomegaly  Musculoskeletal:no cyanosis of digits and no clubbing  NEURO: alert & oriented x 3 with fluent speech, no focal motor/sensory deficits   Labs:  CBC Latest Ref Rng & Units 06/22/2017 03/03/2017 09/03/2016  WBC 3.9 - 10.3 10e3/uL 6.5 6.8 6.9  Hemoglobin 11.6 - 15.9 g/dL 13.1 13.0 13.0  Hematocrit 34.8 - 46.6 % 39.1 38.2 38.2  Platelets 145 - 400 10e3/uL 236 256 299  CMP Latest Ref Rng & Units 06/22/2017 03/03/2017 09/03/2016  Glucose 70 - 140 mg/dl 123 108 104  BUN 7.0 - 26.0 mg/dL 9.3 10.8 9.1  Creatinine 0.6 - 1.1 mg/dL 0.9 0.9 0.8  Sodium 136 - 145 mEq/L 140 141 141  Potassium 3.5 - 5.1 mEq/L 3.5 3.6 3.4(L)  Chloride 96 - 112 mEq/L - - -  CO2 22 - 29 mEq/L 26 24 25   Calcium 8.4 - 10.4 mg/dL 9.3 9.3 8.8  Total Protein 6.4 - 8.3 g/dL 7.6 7.9 7.5  Total Bilirubin 0.20 - 1.20 mg/dL 0.75 0.46 0.35  Alkaline Phos 40 - 150 U/L 85 92 97  AST 5 - 34 U/L 19 22 20   ALT 0 - 55 U/L 11 16 14     RADIOGRAPHIC STUDIES:  US Abdomen 03/17/2017 IMPRESSION: No acute or focal abnormality identified.  CT Head WO Contrast 01/11/2017: IMPRESSION: Normal CT of the head for age.  MR Cervical Spine W WO Contrast 11/26/16 IMPRESSION: Small left sided disc protrusion at C5-6 extending into the foramen. This could be a source of radiculopathy involving the left C6  nerve root. Diffusely abnormal bone marrow likely related to prior chemotherapy or anemia.  Mammogram digital screening right 11/04/2015 IMPRESSION: No mammographic evidence of malignancy. A result letter of this screening mammogram will be mailed directly to the patient.  RECOMMENDATION: Screening mammogram in one year. (Code:SM-B-01Y)  BI-RADS CATEGORY 1: Negative.   ASSESSMENT: Victoria Delacruz 46 y.o. female with a history of Malignant neoplasm of upper-outer quadrant of left breast in female, estrogen receptor positive ([place])   PLAN:   1. Left breast invasive duct carcinoma (ER+,PR+,HER2 negative) , pT1aN0M0, stage Ia diagnosed in 2011, with prior left DCIS in 2004 - She is tolerating the adjuvant tamoxifen well. We'll continue tamoxifen for total of 10 years until 2023 -Her hot flash has been stable and manageable, no other significant side effects from tamoxifen -She had a hysterectomy, no risk of tamoxifen-related endometrial cancer. -She is clinically doing well, lab and exam are unremarkable, no clinical concern of recurrence. -She will do annual mammogram. Next is schedule for early Sep  -Her chronic body pain and abdominal pain are unlikely related to her breast cancer, abdominal US was unremarkable. She has a unremarkable abdominal CT scan in June 2015. -I have encouraged her to eat healthy and exercise regularly, and try to lose some weight. She has gained about 20 pounds in the past 2 years. - She is scheduled for screening mammogram on 07/06/2017.  -continue breast cancer surveillance   2. Genetics  -She is young and had recurrent breast cancer. She states that she had genetic test after she was diagnosed with breast cancer first time. -Previously referred to genetics.  3. HTN, Fibromyalgia, chronic body pain  -She follows up with her primary care physician.  4. Hypokalemia -Her potassium is 3.6 (03/03/2017), improved, I encouraged her to take potassium  enriched food or a potassium supplement  5. Right sided headaches with blurry vision bilaterally -Her chronic headaches are unlikely related to her breast cancer, she had an unremarkable Brain MRI in June 2013 -She has history of migraine headache -She will follow up with her primary care physician.  6. Abdominal and Back pain - The patient reports significant pain to her right flank and back. This is not a new onset. - Patient does have IBS, to which she attributes some of her discomfort to constipation and IBS. - Abdominal US on 03/17/2017 showed no acute or focal  abnormality.  - Offered a referral to GI specialist. The patient does not feel an appointment with GI is necessary at this time but, will request a referral if symptoms worsen.  Plan -lab reviewed, unremarkable -continue tamoxifen - She is scheduled for screening mammogram on 07/06/2017. - Follow up in 6 months.   I spent 20 minutes counseling the patient face to face. The total time spent in the appointment was 25 minutes.  This document serves as a record of services personally performed by Truitt Merle, MD. It was created on her behalf by Arlyce Harman, a trained medical scribe. The creation of this record is based on the scribe's personal observations and the provider's statements to them. This document has been checked and approved by the attending provider.  Truitt Merle  06/22/2017

## 2017-06-22 ENCOUNTER — Ambulatory Visit (HOSPITAL_BASED_OUTPATIENT_CLINIC_OR_DEPARTMENT_OTHER): Payer: 59 | Admitting: Hematology

## 2017-06-22 ENCOUNTER — Encounter: Payer: Self-pay | Admitting: Hematology

## 2017-06-22 ENCOUNTER — Inpatient Hospital Stay (HOSPITAL_BASED_OUTPATIENT_CLINIC_OR_DEPARTMENT_OTHER): Payer: Medicare Other

## 2017-06-22 VITALS — BP 127/75 | HR 73 | Temp 98.2°F | Resp 18 | Ht 63.0 in | Wt 169.1 lb

## 2017-06-22 DIAGNOSIS — Z79810 Long term (current) use of selective estrogen receptor modulators (SERMs): Secondary | ICD-10-CM

## 2017-06-22 DIAGNOSIS — E876 Hypokalemia: Secondary | ICD-10-CM

## 2017-06-22 DIAGNOSIS — C50412 Malignant neoplasm of upper-outer quadrant of left female breast: Secondary | ICD-10-CM

## 2017-06-22 DIAGNOSIS — I10 Essential (primary) hypertension: Secondary | ICD-10-CM

## 2017-06-22 DIAGNOSIS — Z170 Estrogen receptor positive status [ER+]: Secondary | ICD-10-CM

## 2017-06-22 LAB — COMPREHENSIVE METABOLIC PANEL
ALT: 11 U/L (ref 0–55)
AST: 19 U/L (ref 5–34)
Albumin: 3.5 g/dL (ref 3.5–5.0)
Alkaline Phosphatase: 85 U/L (ref 40–150)
Anion Gap: 8 mEq/L (ref 3–11)
BUN: 9.3 mg/dL (ref 7.0–26.0)
CO2: 26 mEq/L (ref 22–29)
Calcium: 9.3 mg/dL (ref 8.4–10.4)
Chloride: 106 mEq/L (ref 98–109)
Creatinine: 0.9 mg/dL (ref 0.6–1.1)
EGFR: 87 mL/min/{1.73_m2} — ABNORMAL LOW (ref >90)
Glucose: 123 mg/dl (ref 70–140)
Potassium: 3.5 mEq/L (ref 3.5–5.1)
Sodium: 140 mEq/L (ref 136–145)
Total Bilirubin: 0.75 mg/dL (ref 0.20–1.20)
Total Protein: 7.6 g/dL (ref 6.4–8.3)

## 2017-06-22 LAB — CBC WITH DIFFERENTIAL/PLATELET
BASO%: 0.5 % (ref 0.0–2.0)
Basophils Absolute: 0 10*3/uL (ref 0.0–0.1)
EOS%: 1.1 % (ref 0.0–7.0)
Eosinophils Absolute: 0.1 10*3/uL (ref 0.0–0.5)
HCT: 39.1 % (ref 34.8–46.6)
HGB: 13.1 g/dL (ref 11.6–15.9)
LYMPH%: 32.1 % (ref 14.0–49.7)
MCH: 30.6 pg (ref 25.1–34.0)
MCHC: 33.5 g/dL (ref 31.5–36.0)
MCV: 91.4 fL (ref 79.5–101.0)
MONO#: 0.2 10*3/uL (ref 0.1–0.9)
MONO%: 3.7 % (ref 0.0–14.0)
NEUT#: 4 10*3/uL (ref 1.5–6.5)
NEUT%: 62.6 % (ref 38.4–76.8)
Platelets: 236 10*3/uL (ref 145–400)
RBC: 4.28 10*6/uL (ref 3.70–5.45)
RDW: 13.3 % (ref 11.2–14.5)
WBC: 6.5 10*3/uL (ref 3.9–10.3)
lymph#: 2.1 10*3/uL (ref 0.9–3.3)

## 2017-07-06 ENCOUNTER — Ambulatory Visit
Admission: RE | Admit: 2017-07-06 | Discharge: 2017-07-06 | Disposition: A | Discharge status: Home / Self Care | Payer: Medicare Other | Source: Ambulatory Visit | Attending: Hematology | Admitting: Hematology

## 2017-07-06 DIAGNOSIS — Z1231 Encounter for screening mammogram for malignant neoplasm of breast: Secondary | ICD-10-CM

## 2017-07-06 HISTORY — DX: Personal history of irradiation: Z92.3

## 2017-08-09 DIAGNOSIS — Z23 Encounter for immunization: Secondary | ICD-10-CM | POA: Diagnosis not present

## 2017-08-09 DIAGNOSIS — M797 Fibromyalgia: Secondary | ICD-10-CM | POA: Diagnosis not present

## 2017-08-09 DIAGNOSIS — M5030 Other cervical disc degeneration, unspecified cervical region: Secondary | ICD-10-CM | POA: Diagnosis not present

## 2017-08-09 DIAGNOSIS — F439 Reaction to severe stress, unspecified: Secondary | ICD-10-CM | POA: Diagnosis not present

## 2017-11-09 DIAGNOSIS — G43109 Migraine with aura, not intractable, without status migrainosus: Secondary | ICD-10-CM | POA: Diagnosis not present

## 2017-11-09 DIAGNOSIS — I10 Essential (primary) hypertension: Secondary | ICD-10-CM | POA: Diagnosis not present

## 2017-11-09 DIAGNOSIS — M797 Fibromyalgia: Secondary | ICD-10-CM | POA: Diagnosis not present

## 2017-11-09 DIAGNOSIS — M79641 Pain in right hand: Secondary | ICD-10-CM | POA: Diagnosis not present

## 2017-11-09 DIAGNOSIS — M5030 Other cervical disc degeneration, unspecified cervical region: Secondary | ICD-10-CM | POA: Diagnosis not present

## 2017-11-09 DIAGNOSIS — F331 Major depressive disorder, recurrent, moderate: Secondary | ICD-10-CM | POA: Diagnosis not present

## 2017-11-09 DIAGNOSIS — R232 Flushing: Secondary | ICD-10-CM | POA: Diagnosis not present

## 2017-11-09 DIAGNOSIS — C50912 Malignant neoplasm of unspecified site of left female breast: Secondary | ICD-10-CM | POA: Diagnosis not present

## 2017-11-09 DIAGNOSIS — M79672 Pain in left foot: Secondary | ICD-10-CM | POA: Diagnosis not present

## 2017-12-23 ENCOUNTER — Ambulatory Visit: Payer: Medicare Other | Admitting: Hematology

## 2017-12-23 ENCOUNTER — Other Ambulatory Visit: Payer: Medicare Other

## 2017-12-26 ENCOUNTER — Telehealth: Payer: Self-pay | Admitting: Hematology

## 2017-12-26 NOTE — Telephone Encounter (Signed)
Spoke with patient appointment scheduled per 2/22 sch msg

## 2018-01-07 NOTE — Progress Notes (Signed)
[facility] OFFICE PROGRESS NOTE  Victoria Stains, MD [address] [place] [address]  DIAGNOSIS: Malignant neoplasm of upper-outer quadrant of left breast in female, estrogen receptor positive ([facility])  Encounter for screening mammogram for breast cancer - Plan: MM Digital Screening Unilat R  CHIEF COMPLAIN: follow up breast cancer     Malignant neoplasm of female breast ([place])   08/02/2003 Initial Diagnosis    MALIGNANT NEOPLASM OF BREAST UNSPECIFIED SITE. High-grade DCIS of Left breast; Tumor was 4.0 cm. Sentinel lymph nodes were negative.  ER, 24%; PR 2%.  Patient had saline implant.       08/02/2003 Surgery    L mastectomy with reconstruction [name]      05/07/2010 Surgery    R port-a-cath placement      05/11/2010 Relapse/Recurrence    Invasive ductal carcinoma of L breast; ER: 99%, positive; PR: 53%, positive; Ki 67 (mib-1): 20%; hER@ neu by FISH without ampliication; ratio of her 2 CEP 17 was 1.17.       05/28/2010 Surgery    Local excision of the L invasive ductal carcinoma by [name].      06/08/2010 - 08/10/2010 Chemotherapy    Completed 4 cycles of adjuvant chemotherapy with cytoxan and taxotere in combination with neulasta       09/08/2010 - 11/04/2010 Radiation Therapy    XRT was given to the left breast and chall wall consisting of 4780 in 26 fractions with an 1800 cGy boost in 9 fractions Dr Valere Dross.      11/04/2010 - 04/17/2011 Chemotherapy    Tamoxifen taken sporadically and discontinued due to vaginal bleeding.       08/30/2011 Surgery    Laparoscopic assisted vaginal hysterectomy.      04/10/2012 Imaging    MRI of the brain normal.  No cause of the patient's headache identified.       12/17/2012 Imaging    CT scan of abdomen and pelvis showed no acute findings in the abdomen or pelvis.  There were felt to be hepatic hemangiomas present, mesuring 2.5 cm in the superior right heaptic lobe at the hepatic dome and a 7 mm  lesion located anteriorly.       08/23/2013 Imaging    Digital diagnostic unilateral right mammogram showed no mamographic or sonographic evidence of malignancy, right breast.         09/11/2013 -  Anti-estrogen oral therapy    Tamoxifen 20 mg once daily, planning for 10 years.      11/26/2016 Imaging    MR Cervical Spine W WO Contrast IMPRESSION: Small left sided disc protrusion at C5-6 extending into the foramen. This could be a source of radiculopathy involving the left C6 nerve root. Diffusely abnormal bone marrow likely related to prior chemotherapy or anemia.      03/17/2017 Imaging    US Abdomen IMPRESSION: No acute or focal abnormality identified.       03/17/2017 Imaging    US Abdomen Complete  IMPRESSION: No acute or focal abnormality identified.      CURRENT THERAPY:Tamoxife n started in 11/2009, held 04/2011-08/2013, and restarted on 09/11/2013   INTERIM HISTORY: Victoria Delacruz returns for follow-up. She presents to the clinic today by herself. She is doing well overall. She is compliant with Tamoxifen and has no complaints. She notes she is having less abdominal cramps. She is trying to drink more water and eat a healthy, balanced diet.   She states she is  unhappy with her breast reconstruction and is considering seeing a second plastic surgery.   On review of systems, pt denies new pain, or any other complaints at this time. Pertinent positives are listed and detailed within the above HPI.   MEDICAL HISTORY: Past Medical History:  Diagnosis Date  . Anemia   . Arthritis   . Blood transfusion 05/17/11, 05/25/11   anemia  . Breast cancer ([facility]) 2004 and 2011  . Depression   . Dizziness   . ECTOPIC PREGNANCY 1997 and 2011   x 2  . Family history of breast cancer    grandmother  . Fibroid   . Fibromyalgia   . Hypertension   . Personal history of radiation therapy 2011    ALLERGIES:  has No Known Allergies.  MEDICATIONS: has a current medication  list which includes the following prescription(s): amlodipine, aspirin-acetaminophen-caffeine, cholecalciferol, cyclobenzaprine, duloxetine, lyrica, meloxicam, polyethylene glycol powder, propranolol, tamoxifen, and potassium chloride sa.  SURGICAL HISTORY:  Past Surgical History:  Procedure Laterality Date  . AUGMENTATION MAMMAPLASTY Left   . BREAST BIOPSY Left 07/05/2003   malignant  . BREAST BIOPSY Left 2011   malignant  . LAPAROSCOPIC ASSISTED VAGINAL HYSTERECTOMY  08/30/2011   Procedure: LAPAROSCOPIC ASSISTED VAGINAL HYSTERECTOMY;  Surgeon: Osborne Oman, MD;  Location: [facility];  Service: Gynecology;  Laterality: N/A;  . LAPAROSCOPY FOR ECTOPIC PREGNANCY    . LAPAROSCOPY W/ MINI-LAPAROTOMY    . MASTECTOMY Left 2004   complete with reconstruction x 3, no b/p punctures to left arm  . RECONSTRUCTION BREAST IMMEDIATE / DELAYED W/ TISSUE EXPANDER  2004  . REDUCTION MAMMAPLASTY Right     REVIEW OF SYSTEMS:   Constitutional: Denies fevers, chills or abnormal weight loss Eyes: Denies blurriness of vision Ears, nose, mouth, throat, and face: Denies mucositis or sore throat Respiratory: Denies cough, dyspnea or wheezes Cardiovascular: Denies palpitation, chest discomfort or lower extremity swelling Gastrointestinal:  Denies nausea, heartburn or change in bowel habits Skin: Denies abnormal skin rashes Lymphatics: Denies new lymphadenopathy or easy bruising Neurological:Denies numbness, tingling or new weaknesses Behavioral/Psych: Mood is stable, no new changes  Breast: (+) asymmetry  All other systems were reviewed with the patient and are negative.   PHYSICAL EXAMINATION: ECOG PERFORMANCE STATUS: 1  Blood pressure 133/85, pulse 64, temperature 98.7 F (37.1 C), temperature source Oral, resp. rate 18, height 5' 3"  (1.6 m), weight 176 lb 3.2 oz (79.9 kg), last menstrual period 04/23/2011, SpO2 100 %. Vitals:   01/09/18 1124  BP: 133/85  Pulse: 64  Resp: 18  Temp: 98.7 F (37.1  C)  SpO2: 100%   GENERAL:alert, no distress and comfortable; NAD. SKIN: skin color, texture, turgor are normal, no rashes or significant lesions EYES: normal, Conjunctiva are pink and non-injected, sclera clear OROPHARYNX:no exudate, no erythema and lips, buccal mucosa, and tongue normal  NECK: supple, thyroid normal size, non-tender, without nodularity LYMPH:  no palpable lymphadenopathy in the cervical, axillary or supraclavicular BREASTS: right breast no mass, no skin changes, no nipple changes, left reconstructed breast is much smaller than the right breast, no abnormal mass, no adenopathy on either side. No tenderness. LUNGS: clear to auscultation with normal breathing effort, no wheezes or rhonchi HEART: regular rate & rhythm and no murmurs and no lower extremity edema ABDOMEN:abdomen soft, mild tenderness in the right upper quadrant, which is chronic, no other tenderness and normal bowel sounds, no organomegaly  Musculoskeletal:no cyanosis of digits and no clubbing  NEURO: alert & oriented x 3 with fluent  speech, no focal motor/sensory deficits   Labs:  CBC Latest Ref Rng & Units 01/09/2018 06/22/2017 03/03/2017  WBC 3.9 - 10.3 K/uL 5.8 6.5 6.8  Hemoglobin 11.6 - 15.9 g/dL 13.0 13.1 13.0  Hematocrit 34.8 - 46.6 % 38.8 39.1 38.2  Platelets 145 - 400 K/uL 254 236 256    CMP Latest Ref Rng & Units 01/09/2018 06/22/2017 03/03/2017  Glucose 70 - 140 mg/dL 116 123 108  BUN 7 - 26 mg/dL 7 9.3 10.8  Creatinine 0.60 - 1.10 mg/dL 0.91 0.9 0.9  Sodium 136 - 145 mmol/L 140 140 141  Potassium 3.5 - 5.1 mmol/L 3.6 3.5 3.6  Chloride 98 - 109 mmol/L 106 - -  CO2 22 - 29 mmol/L 26 26 24   Calcium 8.4 - 10.4 mg/dL 9.2 9.3 9.3  Total Protein 6.4 - 8.3 g/dL 7.6 7.6 7.9  Total Bilirubin 0.2 - 1.2 mg/dL 0.5 0.75 0.46  Alkaline Phos 40 - 150 U/L 88 85 92  AST 5 - 34 U/L 18 19 22   ALT 0 - 55 U/L 13 11 16     RADIOGRAPHIC STUDIES:  Mammogram digital screening right 07/06/2017 IMPRESSION: No  mammographic evidence of malignancy.  US Abdomen 03/17/2017 IMPRESSION: No acute or focal abnormality identified.  CT Head WO Contrast 01/11/2017: IMPRESSION: Normal CT of the head for age.  MR Cervical Spine W WO Contrast 11/26/16 IMPRESSION: Small left sided disc protrusion at C5-6 extending into the foramen. This could be a source of radiculopathy involving the left C6 nerve root. Diffusely abnormal bone marrow likely related to prior chemotherapy or anemia.  Mammogram digital screening right 11/04/2015 IMPRESSION: No mammographic evidence of malignancy. A result letter of this screening mammogram will be mailed directly to the patient.    ASSESSMENT: Victoria Delacruz 47 y.o. female with a history of Malignant neoplasm of upper-outer quadrant of left breast in female, estrogen receptor positive ([facility])  Encounter for screening mammogram for breast cancer - Plan: MM Digital Screening Unilat R   PLAN:   1. Left breast invasive duct carcinoma (ER+,PR+,HER2 negative) , pT1aN0M0, stage Ia diagnosed in 2011, with prior left DCIS in 2004 - She is tolerating the adjuvant tamoxifen well. We'll continue tamoxifen for total of 10 years until 2023 -Her hot flash has been stable and manageable, no other significant side effects from tamoxifen -She had a hysterectomy, no risk of tamoxifen-related endometrial cancer. -Her chronic body pain and abdominal pain are unlikely related to her breast cancer, abdominal US was unremarkable. She has a unremarkable abdominal CT scan in June 2015. -I again encouraged her to eat healthy and exercise regularly, and try to lose some weight. She has gained about 20 pounds in the past 2 years. -labs reviewed, CBC and CMP are WNL, her mammogram from 07/06/17 shows no evidence of malignancy, her exam was unremarkable. There is no clinical concern for recurrence - Next screening mammogram in 07/2018 -continue breast cancer surveillance  -She has asymmetrical breasts  with her left reconstruction being smaller than her right. I will refer her to plastic surgeon to discuss her options.  She will call me when she decides which plastic surgeon to see. -F/u in 6 months, then once a year   2. Genetics  -She is young and had recurrent breast cancer. She states that she had genetic test after she was diagnosed with breast cancer first time. -Previously referred to genetics.  3. HTN, Fibromyalgia, chronic body pain  -She follows up with her primary care physician.  4. Hypokalemia -Her potassium is 3.6 (03/03/2017), improved, I encouraged her to take potassium enriched food or a potassium supplement  5. Right sided headaches with blurry vision bilaterally -Her chronic headaches are unlikely related to her breast cancer, she had an unremarkable Brain MRI in June 2013 -She has history of migraine headache -She will follow up with her primary care physician.  Plan -continue tamoxifen -R mammogram in 07/2018 - Follow up in 6 months.  -I will refer to plastic surgery, she will call with her preference of plastic surgeon   I spent 20 minutes counseling the patient face to face. The total time spent in the appointment was 25 minutes.  This document serves as a record of services personally performed by Truitt Merle, MD. It was created on her behalf by Theresia Bough, a trained medical scribe. The creation of this record is based on the scribe's personal observations and the provider's statements to them.   I have reviewed the above documentation for accuracy and completeness, and I agree with the above.   Truitt Merle  01/09/2018 6:51 PM

## 2018-01-09 ENCOUNTER — Inpatient Hospital Stay (HOSPITAL_BASED_OUTPATIENT_CLINIC_OR_DEPARTMENT_OTHER): Payer: Medicare HMO | Admitting: Hematology

## 2018-01-09 ENCOUNTER — Telehealth: Payer: Self-pay

## 2018-01-09 ENCOUNTER — Inpatient Hospital Stay: Payer: Medicare HMO | Attending: Hematology

## 2018-01-09 ENCOUNTER — Encounter: Payer: Self-pay | Admitting: Hematology

## 2018-01-09 VITALS — BP 133/85 | HR 64 | Temp 98.7°F | Resp 18 | Ht 63.0 in | Wt 176.2 lb

## 2018-01-09 DIAGNOSIS — Z79810 Long term (current) use of selective estrogen receptor modulators (SERMs): Secondary | ICD-10-CM | POA: Diagnosis not present

## 2018-01-09 DIAGNOSIS — Z170 Estrogen receptor positive status [ER+]: Secondary | ICD-10-CM | POA: Diagnosis not present

## 2018-01-09 DIAGNOSIS — M797 Fibromyalgia: Secondary | ICD-10-CM

## 2018-01-09 DIAGNOSIS — G8929 Other chronic pain: Secondary | ICD-10-CM | POA: Diagnosis not present

## 2018-01-09 DIAGNOSIS — C50412 Malignant neoplasm of upper-outer quadrant of left female breast: Secondary | ICD-10-CM

## 2018-01-09 DIAGNOSIS — E876 Hypokalemia: Secondary | ICD-10-CM

## 2018-01-09 DIAGNOSIS — I10 Essential (primary) hypertension: Secondary | ICD-10-CM | POA: Diagnosis not present

## 2018-01-09 DIAGNOSIS — Z1231 Encounter for screening mammogram for malignant neoplasm of breast: Secondary | ICD-10-CM

## 2018-01-09 LAB — COMPREHENSIVE METABOLIC PANEL
ALT: 13 U/L (ref 0–55)
AST: 18 U/L (ref 5–34)
Albumin: 3.6 g/dL (ref 3.5–5.0)
Alkaline Phosphatase: 88 U/L (ref 40–150)
Anion gap: 8 (ref 3–11)
BUN: 7 mg/dL (ref 7–26)
CO2: 26 mmol/L (ref 22–29)
Calcium: 9.2 mg/dL (ref 8.4–10.4)
Chloride: 106 mmol/L (ref 98–109)
Creatinine, Ser: 0.91 mg/dL (ref 0.60–1.10)
GFR calc Af Amer: >60 mL/min (ref >60)
GFR calc non Af Amer: >60 mL/min (ref >60)
Glucose, Bld: 116 mg/dL (ref 70–140)
Potassium: 3.6 mmol/L (ref 3.5–5.1)
Sodium: 140 mmol/L (ref 136–145)
Total Bilirubin: 0.5 mg/dL (ref 0.2–1.2)
Total Protein: 7.6 g/dL (ref 6.4–8.3)

## 2018-01-09 LAB — CBC WITH DIFFERENTIAL/PLATELET
Basophils Absolute: 0 10*3/uL (ref 0.0–0.1)
Basophils Relative: 1 %
Eosinophils Absolute: 0.1 10*3/uL (ref 0.0–0.5)
Eosinophils Relative: 1 %
HCT: 38.8 % (ref 34.8–46.6)
Hemoglobin: 13 g/dL (ref 11.6–15.9)
Lymphocytes Relative: 37 %
Lymphs Abs: 2.1 10*3/uL (ref 0.9–3.3)
MCH: 30.8 pg (ref 25.1–34.0)
MCHC: 33.5 g/dL (ref 31.5–36.0)
MCV: 92 fL (ref 79.5–101.0)
Monocytes Absolute: 0.3 10*3/uL (ref 0.1–0.9)
Monocytes Relative: 4 %
Neutro Abs: 3.3 10*3/uL (ref 1.5–6.5)
Neutrophils Relative %: 57 %
Platelets: 254 10*3/uL (ref 145–400)
RBC: 4.22 MIL/uL (ref 3.70–5.45)
RDW: 14.3 % (ref 11.2–14.5)
WBC: 5.8 10*3/uL (ref 3.9–10.3)

## 2018-01-09 NOTE — Telephone Encounter (Signed)
Printed avs and calender of upcoming appointment. Per 3/11 los 

## 2018-01-10 LAB — VITAMIN D 25 HYDROXY (VIT D DEFICIENCY, FRACTURES): Vit D, 25-Hydroxy: 17.3 ng/mL — ABNORMAL LOW (ref 30.0–100.0)

## 2018-01-19 ENCOUNTER — Telehealth: Payer: Self-pay | Admitting: *Deleted

## 2018-01-19 ENCOUNTER — Other Ambulatory Visit: Payer: Self-pay | Admitting: *Deleted

## 2018-01-19 DIAGNOSIS — R7989 Other specified abnormal findings of blood chemistry: Secondary | ICD-10-CM

## 2018-01-19 MED ORDER — ERGOCALCIFEROL 1.25 MG (50000 UT) PO CAPS: 50000.0000 [IU] | ORAL_CAPSULE | ORAL | 0 refills | Status: DC

## 2018-01-19 NOTE — Telephone Encounter (Signed)
Spoke with pt and informed pt of Dr. Burr Medico instructions about low Vit D level.  Script escribed to pt's pharmacy with instructions below from md.  Pt voiced understanding.

## 2018-01-19 NOTE — Telephone Encounter (Signed)
-----   Message from Truitt Merle, MD sent at 01/15/2018  4:54 PM EDT ----- Please let pt know that her vitd level is low, please call in vitD50,000u weekly X8 for her and she can continue OTC vitD 1000u daily afterwards, thanks.  Truitt Merle  01/15/2018

## 2018-01-31 ENCOUNTER — Ambulatory Visit
Admission: RE | Admit: 2018-01-31 | Discharge: 2018-01-31 | Disposition: A | Discharge status: Home / Self Care | Payer: Medicare HMO | Source: Ambulatory Visit | Attending: Family Medicine | Admitting: Family Medicine

## 2018-01-31 ENCOUNTER — Telehealth: Payer: Self-pay | Admitting: *Deleted

## 2018-01-31 ENCOUNTER — Other Ambulatory Visit: Payer: Self-pay | Admitting: Family Medicine

## 2018-01-31 DIAGNOSIS — M79671 Pain in right foot: Secondary | ICD-10-CM | POA: Diagnosis not present

## 2018-01-31 DIAGNOSIS — M79672 Pain in left foot: Secondary | ICD-10-CM

## 2018-01-31 DIAGNOSIS — M79641 Pain in right hand: Secondary | ICD-10-CM

## 2018-01-31 NOTE — Telephone Encounter (Signed)
Pt called requesting a call back from nurse.  Spoke with pt, and was informed that pt would like to be referred to Dr. Crissie Reese for reconstruction plastic surgery. Dr. Harlow Mares     Phone     [phone]. Pt's    Phone      [phone].

## 2018-02-01 ENCOUNTER — Other Ambulatory Visit: Payer: Self-pay | Admitting: Hematology

## 2018-02-01 ENCOUNTER — Telehealth: Payer: Self-pay | Admitting: *Deleted

## 2018-02-01 DIAGNOSIS — C50412 Malignant neoplasm of upper-outer quadrant of left female breast: Secondary | ICD-10-CM

## 2018-02-01 DIAGNOSIS — Z170 Estrogen receptor positive status [ER+]: Secondary | ICD-10-CM

## 2018-02-01 NOTE — Telephone Encounter (Signed)
Called pt & informed that Dr Burr Medico made referral to Dr Harlow Mares & faxed referral to Dr Harlow Mares.

## 2018-02-01 NOTE — Progress Notes (Signed)
Ambulatory referral 

## 2018-02-06 ENCOUNTER — Telehealth: Payer: Self-pay | Admitting: Hematology

## 2018-02-06 NOTE — Telephone Encounter (Signed)
Per call to Dr Harlow Mares office appt scheduled for 4/26 @ 9:00 and patient is aware per their office. Per 4/3 sch msg

## 2018-02-07 DIAGNOSIS — M79641 Pain in right hand: Secondary | ICD-10-CM | POA: Diagnosis not present

## 2018-02-07 DIAGNOSIS — M797 Fibromyalgia: Secondary | ICD-10-CM | POA: Diagnosis not present

## 2018-02-07 DIAGNOSIS — I10 Essential (primary) hypertension: Secondary | ICD-10-CM | POA: Diagnosis not present

## 2018-02-07 DIAGNOSIS — M79672 Pain in left foot: Secondary | ICD-10-CM | POA: Diagnosis not present

## 2018-02-07 DIAGNOSIS — M79642 Pain in left hand: Secondary | ICD-10-CM | POA: Diagnosis not present

## 2018-02-17 ENCOUNTER — Telehealth: Payer: Self-pay | Admitting: *Deleted

## 2018-02-17 NOTE — Telephone Encounter (Signed)
Received call from [facility] stating referral received but no records.  She asked for recent notes/path.  Faxed last two OV notes from Dr Burr Medico & pathology related to breast cancer to [phone] with message to call back if more info needed.

## 2018-02-23 ENCOUNTER — Telehealth: Payer: Self-pay

## 2018-02-23 ENCOUNTER — Telehealth: Payer: Self-pay | Admitting: Hematology

## 2018-02-23 NOTE — Telephone Encounter (Signed)
Patient called stating that she has an appointment with Dr. Harlow Mares tomorrow at 9:00 per their office they indicated they need copies of her surgery from 2004 and 2011 be faxed or they will cancel her appointment.  Requested these medical records to be faxed over immediately and notified patient.

## 2018-02-23 NOTE — Telephone Encounter (Signed)
Informed patient that her records have been faxed now.  Patient verbalized an understanding to keep her appointment.

## 2018-02-23 NOTE — Telephone Encounter (Signed)
Faxed op note to Dr. Harlow Mares [phone]

## 2018-02-23 NOTE — Telephone Encounter (Signed)
"  Dr. Harlow Mares received everything except for the 2004 surgery information and he needs everything to know what  He's dealing with.  Appointment is tomorrow at 9:00 am."  This nurse eSearch for conversion of requested information to [facility].  2004 information retrieved for Dr. [name] 08-05-2003 OP Report, 2004 Surgical Pathology case # [id number], [phone], [id number] faxed to number patient provided.  Added Dr. Harlow Mares to care  Team.

## 2018-02-23 NOTE — Telephone Encounter (Signed)
"  I was told my records were being sent to Dr. Harlow Mares.  Records have not been received.  I cannot be seen if tomorrow.  Their office gave me the fax number to send  My records.  [id number] is the correct number."  H.I.M. Lamonte Sakai been provided this number.

## 2018-02-23 NOTE — Telephone Encounter (Signed)
Called Kim at Dr. Harlow Mares office notified her just faxed 2004 surgery records. Receipt acknowledged.

## 2018-02-24 DIAGNOSIS — Z853 Personal history of malignant neoplasm of breast: Secondary | ICD-10-CM | POA: Diagnosis not present

## 2018-02-24 DIAGNOSIS — Z9882 Breast implant status: Secondary | ICD-10-CM | POA: Diagnosis not present

## 2018-03-21 ENCOUNTER — Other Ambulatory Visit: Payer: Self-pay | Admitting: Plastic Surgery

## 2018-03-21 DIAGNOSIS — Z01818 Encounter for other preprocedural examination: Secondary | ICD-10-CM

## 2018-03-21 DIAGNOSIS — Z853 Personal history of malignant neoplasm of breast: Secondary | ICD-10-CM | POA: Diagnosis not present

## 2018-03-23 ENCOUNTER — Ambulatory Visit
Admission: RE | Admit: 2018-03-23 | Discharge: 2018-03-23 | Disposition: A | Discharge status: Home / Self Care | Payer: Medicare HMO | Source: Ambulatory Visit | Attending: Plastic Surgery | Admitting: Plastic Surgery

## 2018-03-23 DIAGNOSIS — R922 Inconclusive mammogram: Secondary | ICD-10-CM | POA: Diagnosis not present

## 2018-03-23 DIAGNOSIS — Z01818 Encounter for other preprocedural examination: Secondary | ICD-10-CM

## 2018-03-28 ENCOUNTER — Other Ambulatory Visit: Payer: Self-pay | Admitting: Plastic Surgery

## 2018-03-28 DIAGNOSIS — C50412 Malignant neoplasm of upper-outer quadrant of left female breast: Secondary | ICD-10-CM | POA: Diagnosis not present

## 2018-03-28 DIAGNOSIS — Z853 Personal history of malignant neoplasm of breast: Secondary | ICD-10-CM | POA: Diagnosis not present

## 2018-03-28 DIAGNOSIS — Z9882 Breast implant status: Secondary | ICD-10-CM | POA: Diagnosis not present

## 2018-03-28 DIAGNOSIS — N62 Hypertrophy of breast: Secondary | ICD-10-CM | POA: Diagnosis not present

## 2018-03-28 DIAGNOSIS — N6011 Diffuse cystic mastopathy of right breast: Secondary | ICD-10-CM | POA: Diagnosis not present

## 2018-05-09 DIAGNOSIS — M797 Fibromyalgia: Secondary | ICD-10-CM | POA: Diagnosis not present

## 2018-05-09 DIAGNOSIS — F419 Anxiety disorder, unspecified: Secondary | ICD-10-CM | POA: Diagnosis not present

## 2018-05-09 DIAGNOSIS — F325 Major depressive disorder, single episode, in full remission: Secondary | ICD-10-CM | POA: Diagnosis not present

## 2018-05-09 DIAGNOSIS — I10 Essential (primary) hypertension: Secondary | ICD-10-CM | POA: Diagnosis not present

## 2018-07-10 ENCOUNTER — Inpatient Hospital Stay: Payer: Medicare HMO

## 2018-07-10 ENCOUNTER — Inpatient Hospital Stay: Payer: Medicare HMO | Admitting: Hematology

## 2018-07-10 NOTE — Progress Notes (Signed)
[place] OFFICE PROGRESS NOTE  Harlan Stains, MD [address] [place] [address]  DIAGNOSIS: Malignant neoplasm of upper-outer quadrant of left breast in female, estrogen receptor positive ([place])  CHIEF COMPLAIN: follow up breast cancer     Malignant neoplasm of female breast ([place])   08/02/2003 Initial Diagnosis    MALIGNANT NEOPLASM OF BREAST UNSPECIFIED SITE. High-grade DCIS of Left breast; Tumor was 4.0 cm. Sentinel lymph nodes were negative.  ER, 24%; PR 2%.  Patient had saline implant.     08/02/2003 Surgery    L mastectomy with reconstruction [name]    05/07/2010 Surgery    R port-a-cath placement    05/11/2010 Relapse/Recurrence    Invasive ductal carcinoma of L breast; ER: 99%, positive; PR: 53%, positive; Ki 67 (mib-1): 20%; hER@ neu by FISH without ampliication; ratio of her 2 CEP 17 was 1.17.     05/28/2010 Surgery    Local excision of the L invasive ductal carcinoma by [name].    06/08/2010 - 08/10/2010 Chemotherapy    Completed 4 cycles of adjuvant chemotherapy with cytoxan and taxotere in combination with neulasta     09/08/2010 - 11/04/2010 Radiation Therapy    XRT was given to the left breast and chall wall consisting of 4780 in 26 fractions with an 1800 cGy boost in 9 fractions Dr Valere Dross.    11/04/2010 - 04/17/2011 Chemotherapy    Tamoxifen taken sporadically and discontinued due to vaginal bleeding.     08/30/2011 Surgery    Laparoscopic assisted vaginal hysterectomy.    04/10/2012 Imaging    MRI of the brain normal.  No cause of the patient's headache identified.     12/17/2012 Imaging    CT scan of abdomen and pelvis showed no acute findings in the abdomen or pelvis.  There were felt to be hepatic hemangiomas present, mesuring 2.5 cm in the superior right heaptic lobe at the hepatic dome and a 7 mm lesion located anteriorly.     08/23/2013 Imaging    Digital diagnostic unilateral right mammogram showed no mamographic  or sonographic evidence of malignancy, right breast.       09/11/2013 -  Anti-estrogen oral therapy    Tamoxifen 20 mg once daily, planning for 10 years.    11/26/2016 Imaging    MR Cervical Spine W WO Contrast IMPRESSION: Small left sided disc protrusion at C5-6 extending into the foramen. This could be a source of radiculopathy involving the left C6 nerve root. Diffusely abnormal bone marrow likely related to prior chemotherapy or anemia.    03/17/2017 Imaging    US Abdomen IMPRESSION: No acute or focal abnormality identified.     03/17/2017 Imaging    US Abdomen Complete  IMPRESSION: No acute or focal abnormality identified.    03/23/2018 Imaging    03/23/2018 Mammogram IMPRESSION: No evidence of malignancy.    CURRENT THERAPY:Tamoxifen started in 11/2009, held 04/2011-08/2013, and restarted on 09/11/2013   INTERIM HISTORY: BRITTISH BOLINGER returns for follow-up. She had her reduction on the right and temporary implant to her left breast completed by Dr. Janace Hoard in May 2019. She will have a fat graft from her stomach to her left breast and that surgery will be on 09/12/2018. She is doing well overall. She has been doing well on Tamoxifen so far. She reports associated intermittent headache and vision change. She denies back pain, decreased energy level, and any other symptoms. She went running last week at [facility],  which she enjoyed.   Since her last visit to the office, she had a unilateral right diagnostic mammogram on 03/23/2018 that showed no evidence of malignancy,   She also had a mammoplasty of her right breast on 03/28/2018 that showed: Breast, Mammoplasty, right tissue with fibroadenomatoid and fibrocystic changes, including apocrine metaplasia, adenosis, usual ductal hyperplasia and stromal fibrosis with pseudoangiomatous stromal hyperplasia. No atypia, in situ or invasive malignancy identified.     MEDICAL HISTORY: Past Medical History:  Diagnosis Date  . Anemia    . Arthritis   . Blood transfusion 05/17/11, 05/25/11   anemia  . Breast cancer ([place]) 2004 and 2011  . Depression   . Dizziness   . ECTOPIC PREGNANCY 1997 and 2011   x 2  . Family history of breast cancer    grandmother  . Fibroid   . Fibromyalgia   . Hypertension   . Personal history of radiation therapy 2011    ALLERGIES:  has No Known Allergies.  MEDICATIONS: has a current medication list which includes the following prescription(s): amlodipine, aspirin-acetaminophen-caffeine, cholecalciferol, cyclobenzaprine, duloxetine, lyrica, meloxicam, polyethylene glycol powder, propranolol, tamoxifen, and potassium chloride sa.  SURGICAL HISTORY:  Past Surgical History:  Procedure Laterality Date  . AUGMENTATION MAMMAPLASTY Left   . BREAST BIOPSY Left 07/05/2003   malignant  . BREAST BIOPSY Left 2011   malignant  . LAPAROSCOPIC ASSISTED VAGINAL HYSTERECTOMY  08/30/2011   Procedure: LAPAROSCOPIC ASSISTED VAGINAL HYSTERECTOMY;  Surgeon: Osborne Oman, MD;  Location: [facility];  Service: Gynecology;  Laterality: N/A;  . LAPAROSCOPY FOR ECTOPIC PREGNANCY    . LAPAROSCOPY W/ MINI-LAPAROTOMY    . MASTECTOMY Left 2004   complete with reconstruction x 3, no b/p punctures to left arm  . RECONSTRUCTION BREAST IMMEDIATE / DELAYED W/ TISSUE EXPANDER  2004  . REDUCTION MAMMAPLASTY Right     REVIEW OF SYSTEMS:   Constitutional: Denies fevers, chills or abnormal weight loss Eyes: Denies blurriness of vision Ears, nose, mouth, throat, and face: Denies mucositis or sore throat Respiratory: Denies cough, dyspnea or wheezes Cardiovascular: Denies palpitation, chest discomfort or lower extremity swelling Gastrointestinal:  Denies nausea, heartburn or change in bowel habits Skin: Denies abnormal skin rashes Lymphatics: Denies new lymphadenopathy or easy bruising Neurological:Denies numbness, tingling or new weaknesses Behavioral/Psych: Mood is stable, no new changes  Breast: (+) asymmetry  All  other systems were reviewed with the patient and are negative.   PHYSICAL EXAMINATION:  ECOG PERFORMANCE STATUS: 1  Blood pressure 127/86, pulse 60, temperature 98.4 F (36.9 C), temperature source Oral, resp. rate 18, height '5\' 3"'$  (1.6 m), weight 170 lb 6.4 oz (77.3 kg), last menstrual period 04/23/2011, SpO2 96 %. Vitals:   07/11/18 1425  BP: 127/86  Pulse: 60  Resp: 18  Temp: 98.4 F (36.9 C)  SpO2: 96%   GENERAL:alert, no distress and comfortable; NAD. SKIN: skin color, texture, turgor are normal, no rashes or significant lesions EYES: normal, Conjunctiva are pink and non-injected, sclera clear OROPHARYNX:no exudate, no erythema and lips, buccal mucosa, and tongue normal  NECK: supple, thyroid normal size, non-tender, without nodularity LYMPH:  no palpable lymphadenopathy in the cervical, axillary or supraclavicular BREASTS: s/p breast reduction on right. Scar healed well. Left side s/p mastectomy and implant. No palpable mass or adenopathy.  LUNGS: clear to auscultation with normal breathing effort, no wheezes or rhonchi HEART: regular rate & rhythm and no murmurs and no lower extremity edema ABDOMEN:abdomen soft, mild tenderness in the right upper quadrant, which is chronic,  no other tenderness and normal bowel sounds, no organomegaly  Musculoskeletal:no cyanosis of digits and no clubbing  NEURO: alert & oriented x 3 with fluent speech, no focal motor/sensory deficits   Labs:  CBC Latest Ref Rng & Units 07/11/2018 01/09/2018 06/22/2017  WBC 3.9 - 10.3 K/uL 8.7 5.8 6.5  Hemoglobin 11.6 - 15.9 g/dL 12.2 13.0 13.1  Hematocrit 34.8 - 46.6 % 36.5 38.8 39.1  Platelets 145 - 400 K/uL 249 254 236    CMP Latest Ref Rng & Units 01/09/2018 06/22/2017 03/03/2017  Glucose 70 - 140 mg/dL 116 123 108  BUN 7 - 26 mg/dL 7 9.3 10.8  Creatinine 0.60 - 1.10 mg/dL 0.91 0.9 0.9  Sodium 136 - 145 mmol/L 140 140 141  Potassium 3.5 - 5.1 mmol/L 3.6 3.5 3.6  Chloride 98 - 109 mmol/L 106 - -  CO2 22  - 29 mmol/L _0 Calcium 8.4 - 10.4 mg/dL 9.2 9.3 9.3  Total Protein 6.4 - 8.3 g/dL 7.6 7.6 7.9  Total Bilirubin 0.2 - 1.2 mg/dL 0.5 0.75 0.46  Alkaline Phos 40 - 150 U/L 88 85 92  AST 5 - 34 U/L _1 ALT 0 - 55 U/L _2 RADIOGRAPHIC STUDIES:  03/23/2018 Mammogram IMPRESSION: No evidence of malignancy.  Mammogram digital screening right 07/06/2017 IMPRESSION: No mammographic evidence of malignancy.  US Abdomen 03/17/2017 IMPRESSION: No acute or focal abnormality identified.  CT Head WO Contrast 01/11/2017: IMPRESSION: Normal CT of the head for age.  MR Cervical Spine W WO Contrast 11/26/16 IMPRESSION: Small left sided disc protrusion at C5-6 extending into the foramen. This could be a source of radiculopathy involving the left C6 nerve root. Diffusely abnormal bone marrow likely related to prior chemotherapy or anemia.  Mammogram digital screening right 11/04/2015 IMPRESSION: No mammographic evidence of malignancy. A result letter of this screening mammogram will be mailed directly to the patient.    ASSESSMENT: Victoria Delacruz 47 y.o. female with a history of Malignant neoplasm of upper-outer quadrant of left breast in female, estrogen receptor positive ([place])   PLAN:   1. Left breast invasive duct carcinoma (ER+,PR+,HER2 negative) , [id number], stage Ia diagnosed in 2011, with prior left DCIS in 2004 - She is tolerating the adjuvant tamoxifen well. We'll continue tamoxifen for total of 10 years until 2023 -Her hot flash has been stable and manageable, no other significant side effects from tamoxifen -She had a hysterectomy, no risk of tamoxifen-related endometrial cancer. -Her chronic body pain and abdominal pain are unlikely related to her breast cancer, abdominal US was unremarkable. She has a unremarkable abdominal CT scan in June 2015. -I again encouraged her to eat healthy and exercise regularly, and try to lose some weight. She has gained about 20  pounds in the past 2 years. -labs reviewed, CBC and CMP are WNL, her mammogram from 03/2018 shows no evidence of malignancy, her exam was unremarkable. There is no clinical concern for recurrence - Next screening right mammogram in 03/2019  -continue breast cancer surveillance  -She recently had reconstruction on her right breast along with tissue expander placement to left performed by Dr. Harlow Mares. She has a follow up surgery on 09/12/2018 for a fat graft to her left breast.  -F/u in 6 months  2. Genetics  -She is young and had recurrent breast cancer. She states that she had genetic test after she was diagnosed with breast cancer first time which was negative  -Previously referred  to genetics.  3. HTN, Fibromyalgia, chronic body pain  -She follows up with her primary care physician.  4. Hypokalemia -Her potassium is 3.6 (03/03/2017), improved, I encouraged her to take potassium enriched food or a potassium supplement  5. Right sided headaches with blurry vision bilaterally -Her chronic headaches are unlikely related to her breast cancer, she had an unremarkable Brain MRI in June 2013 -She has history of migraine headache -She will follow up with her primary care physician.  Plan -continue tamoxifen - Follow up in 6 months.    I spent 15 minutes counseling the patient face to face. The total time spent in the appointment was 20 minutes.   Soijett A Blue  07/11/2018 2:53 PM   I, Soijett Blue am acting as scribe for Dr. Truitt Merle.  I have reviewed the above documentation for accuracy and completeness, and I agree with the above.

## 2018-07-11 ENCOUNTER — Encounter: Payer: Self-pay | Admitting: Hematology

## 2018-07-11 ENCOUNTER — Inpatient Hospital Stay (HOSPITAL_BASED_OUTPATIENT_CLINIC_OR_DEPARTMENT_OTHER): Payer: Medicare HMO | Admitting: Hematology

## 2018-07-11 ENCOUNTER — Inpatient Hospital Stay: Payer: Medicare HMO | Attending: Hematology

## 2018-07-11 ENCOUNTER — Telehealth: Payer: Self-pay | Admitting: Hematology

## 2018-07-11 VITALS — BP 127/86 | HR 60 | Temp 98.4°F | Resp 18 | Ht 63.0 in | Wt 170.4 lb

## 2018-07-11 DIAGNOSIS — Z170 Estrogen receptor positive status [ER+]: Secondary | ICD-10-CM

## 2018-07-11 DIAGNOSIS — M797 Fibromyalgia: Secondary | ICD-10-CM | POA: Diagnosis not present

## 2018-07-11 DIAGNOSIS — C50412 Malignant neoplasm of upper-outer quadrant of left female breast: Secondary | ICD-10-CM | POA: Diagnosis not present

## 2018-07-11 DIAGNOSIS — E876 Hypokalemia: Secondary | ICD-10-CM | POA: Diagnosis not present

## 2018-07-11 DIAGNOSIS — I10 Essential (primary) hypertension: Secondary | ICD-10-CM | POA: Insufficient documentation

## 2018-07-11 DIAGNOSIS — G43909 Migraine, unspecified, not intractable, without status migrainosus: Secondary | ICD-10-CM | POA: Insufficient documentation

## 2018-07-11 DIAGNOSIS — Z79810 Long term (current) use of selective estrogen receptor modulators (SERMs): Secondary | ICD-10-CM

## 2018-07-11 LAB — CBC WITH DIFFERENTIAL/PLATELET
Basophils Absolute: 0.1 10*3/uL (ref 0.0–0.1)
Basophils Relative: 1 %
Eosinophils Absolute: 0.1 10*3/uL (ref 0.0–0.5)
Eosinophils Relative: 1 %
HCT: 36.5 % (ref 34.8–46.6)
Hemoglobin: 12.2 g/dL (ref 11.6–15.9)
Lymphocytes Relative: 27 %
Lymphs Abs: 2.3 10*3/uL (ref 0.9–3.3)
MCH: 30.5 pg (ref 25.1–34.0)
MCHC: 33.5 g/dL (ref 31.5–36.0)
MCV: 91.1 fL (ref 79.5–101.0)
Monocytes Absolute: 0.5 10*3/uL (ref 0.1–0.9)
Monocytes Relative: 6 %
Neutro Abs: 5.8 10*3/uL (ref 1.5–6.5)
Neutrophils Relative %: 65 %
Platelets: 249 10*3/uL (ref 145–400)
RBC: 4.01 MIL/uL (ref 3.70–5.45)
RDW: 13.8 % (ref 11.2–14.5)
WBC: 8.7 10*3/uL (ref 3.9–10.3)

## 2018-07-11 LAB — COMPREHENSIVE METABOLIC PANEL
ALT: 7 U/L (ref 0–44)
AST: 16 U/L (ref 15–41)
Albumin: 3.5 g/dL (ref 3.5–5.0)
Alkaline Phosphatase: 87 U/L (ref 38–126)
Anion gap: 7 (ref 5–15)
BUN: 8 mg/dL (ref 6–20)
CO2: 26 mmol/L (ref 22–32)
Calcium: 9.2 mg/dL (ref 8.9–10.3)
Chloride: 107 mmol/L (ref 98–111)
Creatinine, Ser: 0.92 mg/dL (ref 0.44–1.00)
GFR calc Af Amer: >60 mL/min (ref >60)
GFR calc non Af Amer: >60 mL/min (ref >60)
Glucose, Bld: 100 mg/dL — ABNORMAL HIGH (ref 70–99)
Potassium: 3.3 mmol/L — ABNORMAL LOW (ref 3.5–5.1)
Sodium: 140 mmol/L (ref 135–145)
Total Bilirubin: 0.5 mg/dL (ref 0.3–1.2)
Total Protein: 7 g/dL (ref 6.5–8.1)

## 2018-07-11 MED ORDER — TAMOXIFEN CITRATE 20 MG PO TABS: 20.0000 mg | ORAL_TABLET | Freq: Every day | ORAL | 1 refills | Status: DC

## 2018-07-11 NOTE — Telephone Encounter (Signed)
Scheduled appt per 9/10 los - gave patient AVS and calender per los.   

## 2018-07-17 ENCOUNTER — Telehealth: Payer: Self-pay

## 2018-07-17 NOTE — Telephone Encounter (Signed)
-----   Message from Truitt Merle, MD sent at 07/15/2018  2:03 PM EDT ----- Please let pt know that her K was low last week, eat potassium rich food or call in KCL 60meq daily for 10 days, thanks   [employer]  07/15/2018

## 2018-07-17 NOTE — Telephone Encounter (Signed)
Spoke with patient and made aware of results. She stated that she will improve her diet and she thinks she has potassium tablets that she can also take 1 tab daily. She will update Dr. Burr Medico if she does take the extra potassium tablets.

## 2018-08-21 DIAGNOSIS — C50912 Malignant neoplasm of unspecified site of left female breast: Secondary | ICD-10-CM | POA: Diagnosis not present

## 2018-08-21 DIAGNOSIS — Z170 Estrogen receptor positive status [ER+]: Secondary | ICD-10-CM | POA: Diagnosis not present

## 2018-09-12 DIAGNOSIS — C50912 Malignant neoplasm of unspecified site of left female breast: Secondary | ICD-10-CM | POA: Diagnosis not present

## 2018-09-12 DIAGNOSIS — Z170 Estrogen receptor positive status [ER+]: Secondary | ICD-10-CM | POA: Diagnosis not present

## 2018-09-12 DIAGNOSIS — Z853 Personal history of malignant neoplasm of breast: Secondary | ICD-10-CM | POA: Diagnosis not present

## 2018-11-01 DIAGNOSIS — K649 Unspecified hemorrhoids: Secondary | ICD-10-CM

## 2018-11-01 HISTORY — DX: Unspecified hemorrhoids: K64.9

## 2018-11-06 DIAGNOSIS — Z23 Encounter for immunization: Secondary | ICD-10-CM | POA: Diagnosis not present

## 2018-11-06 DIAGNOSIS — M797 Fibromyalgia: Secondary | ICD-10-CM | POA: Diagnosis not present

## 2018-11-06 DIAGNOSIS — F419 Anxiety disorder, unspecified: Secondary | ICD-10-CM | POA: Diagnosis not present

## 2018-11-06 DIAGNOSIS — I10 Essential (primary) hypertension: Secondary | ICD-10-CM | POA: Diagnosis not present

## 2018-11-28 ENCOUNTER — Other Ambulatory Visit: Payer: Self-pay | Admitting: Hematology

## 2018-11-28 ENCOUNTER — Other Ambulatory Visit: Payer: Self-pay | Admitting: *Deleted

## 2018-11-28 DIAGNOSIS — C50412 Malignant neoplasm of upper-outer quadrant of left female breast: Secondary | ICD-10-CM

## 2018-11-28 DIAGNOSIS — Z170 Estrogen receptor positive status [ER+]: Secondary | ICD-10-CM

## 2019-01-08 DIAGNOSIS — Z23 Encounter for immunization: Secondary | ICD-10-CM | POA: Diagnosis not present

## 2019-01-08 DIAGNOSIS — Z0000 Encounter for general adult medical examination without abnormal findings: Secondary | ICD-10-CM | POA: Diagnosis not present

## 2019-01-08 DIAGNOSIS — M797 Fibromyalgia: Secondary | ICD-10-CM | POA: Diagnosis not present

## 2019-01-08 DIAGNOSIS — K648 Other hemorrhoids: Secondary | ICD-10-CM | POA: Diagnosis not present

## 2019-01-08 DIAGNOSIS — E559 Vitamin D deficiency, unspecified: Secondary | ICD-10-CM | POA: Diagnosis not present

## 2019-01-08 DIAGNOSIS — F331 Major depressive disorder, recurrent, moderate: Secondary | ICD-10-CM | POA: Diagnosis not present

## 2019-01-08 DIAGNOSIS — F419 Anxiety disorder, unspecified: Secondary | ICD-10-CM | POA: Diagnosis not present

## 2019-01-08 DIAGNOSIS — I10 Essential (primary) hypertension: Secondary | ICD-10-CM | POA: Diagnosis not present

## 2019-01-10 ENCOUNTER — Inpatient Hospital Stay: Payer: Medicare HMO

## 2019-01-10 ENCOUNTER — Inpatient Hospital Stay: Payer: Medicare HMO | Admitting: Hematology

## 2019-01-11 ENCOUNTER — Telehealth: Payer: Self-pay | Admitting: Hematology

## 2019-01-11 ENCOUNTER — Telehealth: Payer: Self-pay

## 2019-01-11 IMAGING — CT CT CERVICAL SPINE W/ CM
4 of 5 series · 15 of 33 positions shown, 17 images · non-contrast
Comparison: Head CT without contrast 01/11/2017. Cervical spine MRI
11/26/2016.

CLINICAL DATA: 46-year-old female with cervical neck pain radiating
to the left arm. Left C5-C6 disc abnormality.
TECHNIQUE: Contiguous axial images were obtained through the Cervical spine
after the intrathecal infusion of infusion. Coronal and sagittal
reconstructions were obtained of the axial image sets.

[Series 5: c_spine 2.0 st · axial · 0.34mm/px · z∈[-255,-127]mm · 5 of 96 slices shown, 7 images]
[im 16/96  soft-tissue]
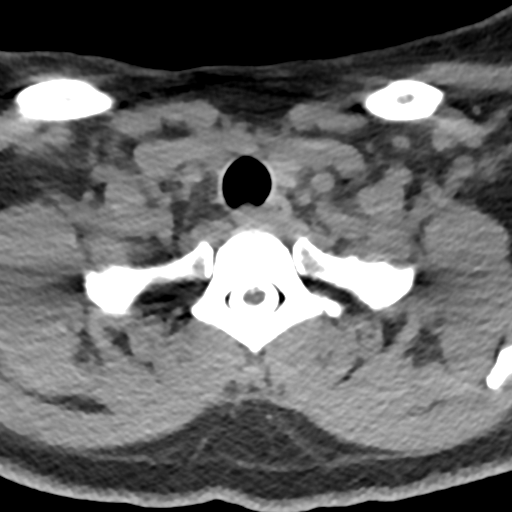
[im 16/96  bone]
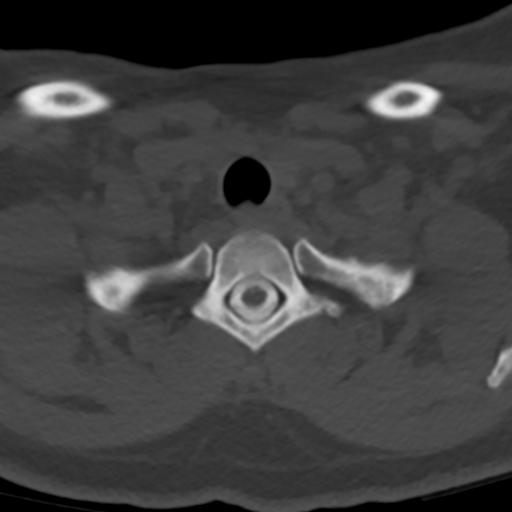
[im 32/96  bone]
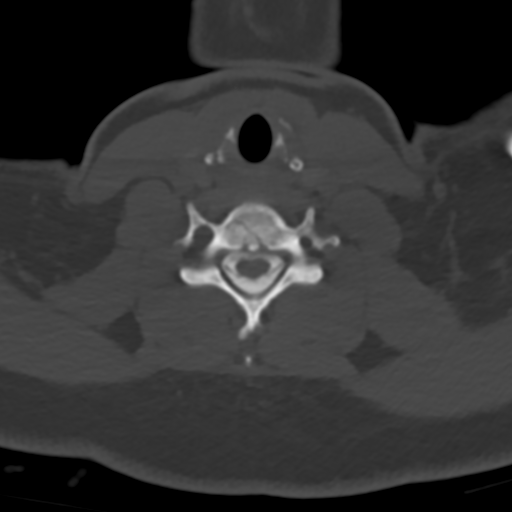
[im 48/96  bone]
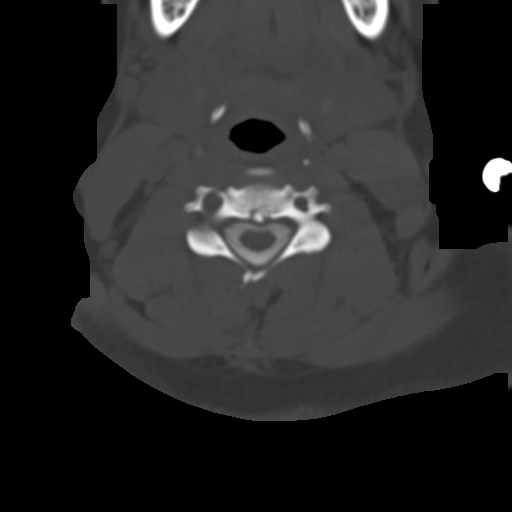
[im 64/96  bone]
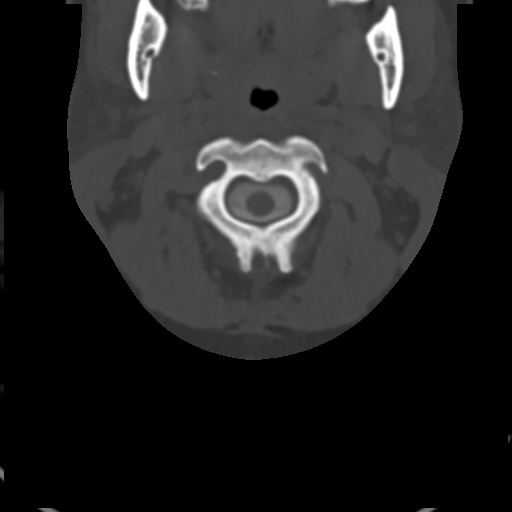
[im 80/96  soft-tissue]
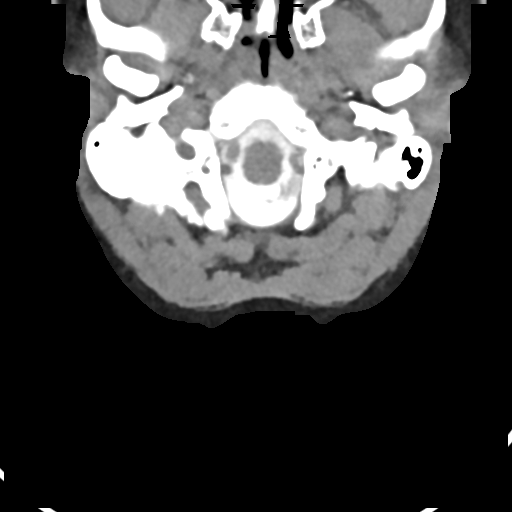
[im 80/96  bone]
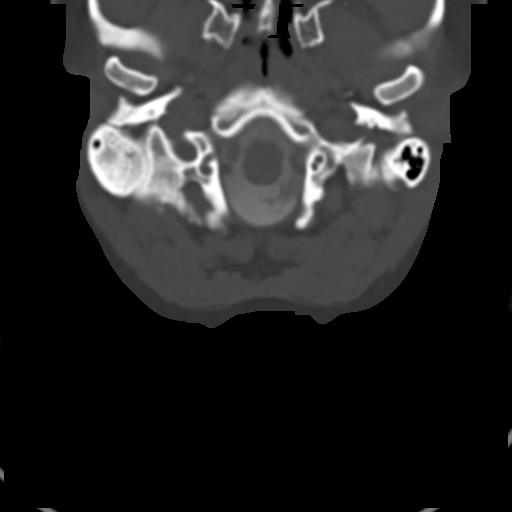

[Series 11: orthogonal st · axial · 0.21mm/px · z∈[-266,-241]mm · 2 of 90 slices shown]
[im 15/90  bone]
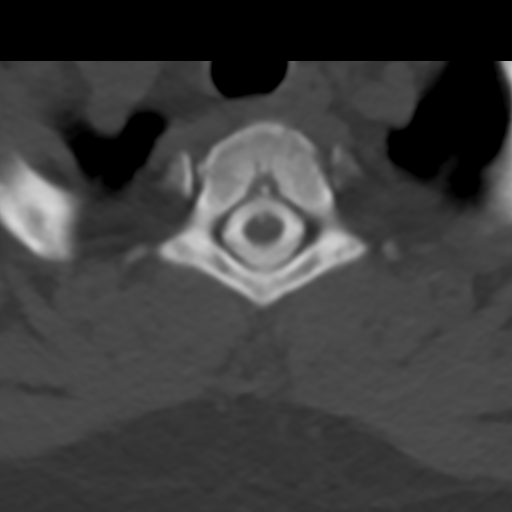
[im 30/90  bone]
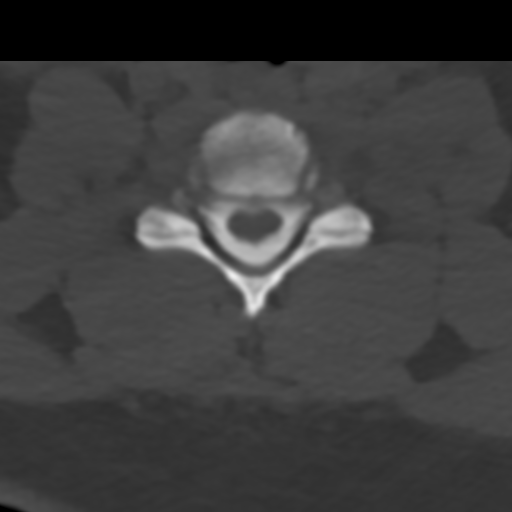

[Series 12: coronal bone · coronal · 0.28mm/px · 3 of 61 slices shown (1 of 2)]
[im 13/61  bone]
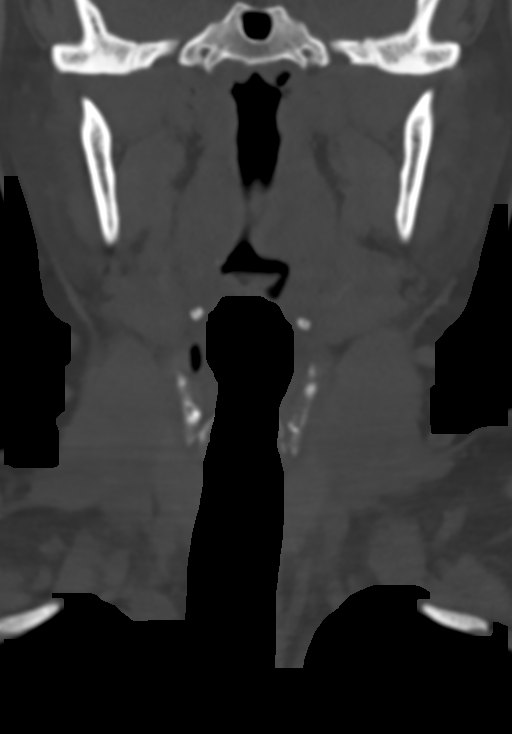
[im 25/61  bone]
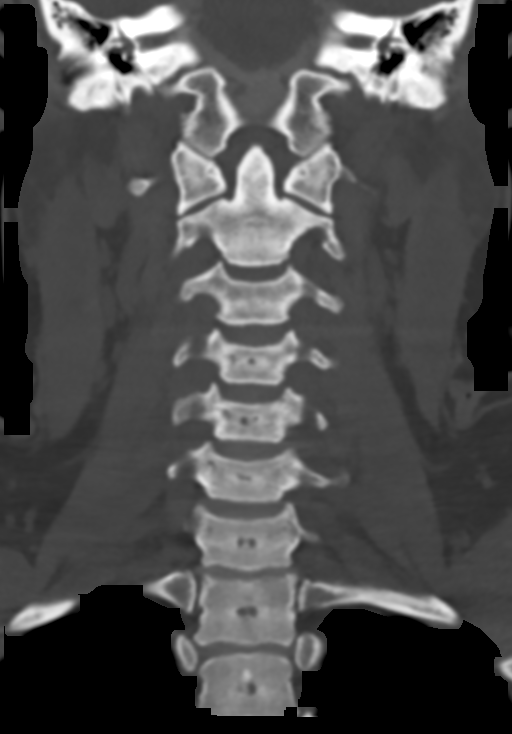
[im 37/61  bone]
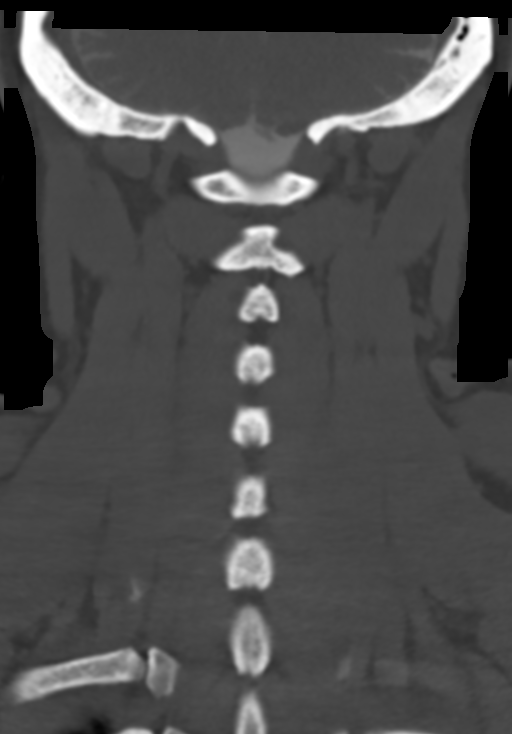

[Series 13: coronal bone · sagittal · 0.37mm/px · 5 of 74 slices shown (2 of 2)]
[im 13/74  bone]
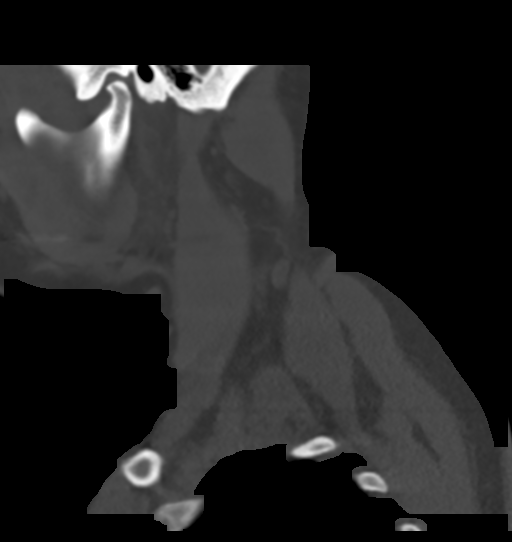
[im 25/74  bone]
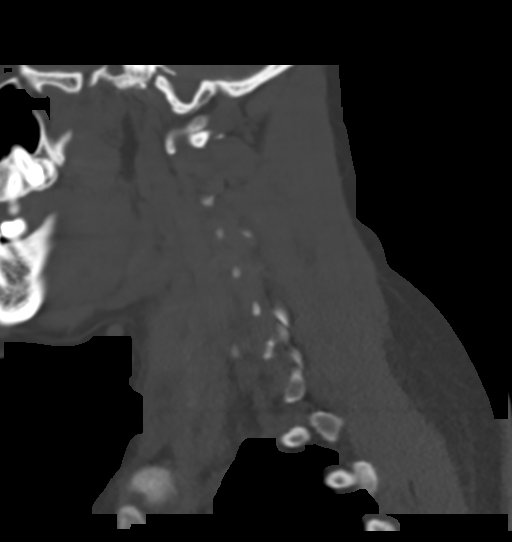
[im 37/74  bone]
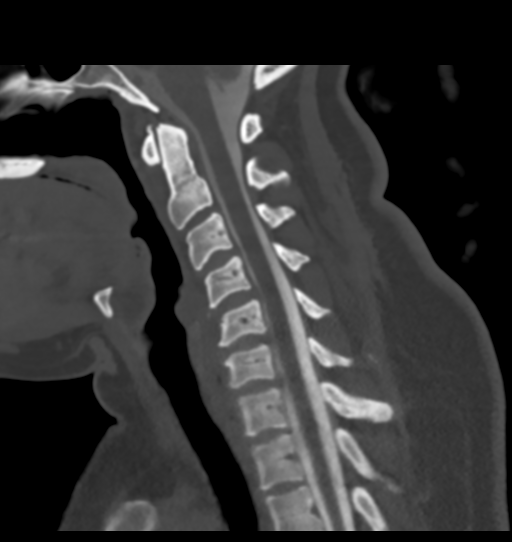
[im 49/74  bone]
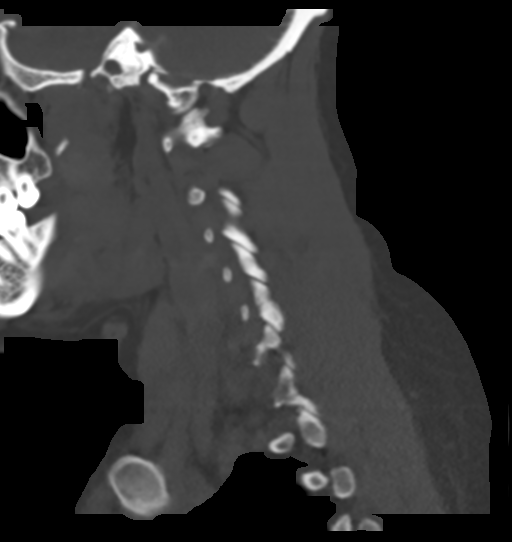
[im 61/74  bone]
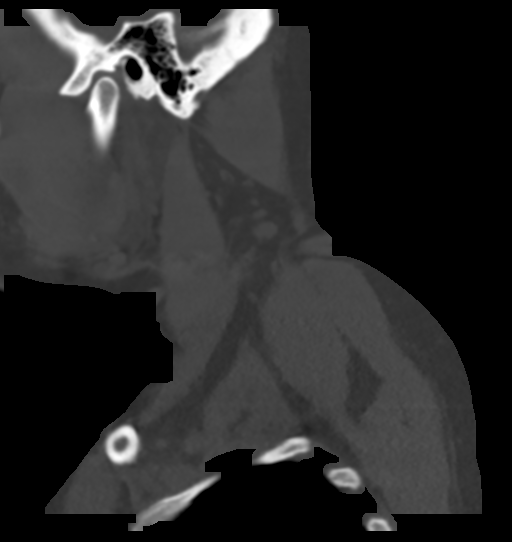

[15 of 33 positions shown; findings below may reference images not displayed]

FLUOROSCOPY TIME:  0 minutes 42 seconds

PROCEDURE:
Lumbar puncture and intrathecal contrast administration were
performed by Dr. JOHIRUL MALTO who will separately report for the
portion of the procedure.

I personally transmitted the myelographic contrast into the cervical
spine and supervised acquisition of the myelogram images.
FINDINGS: CERVICAL MYELOGRAM FINDINGS:

Could intrathecal contrast opacification of the cervical spine. Mild
endplate spurring at C4-C5 and C5-C6. Normal cervicothoracic
junction alignment. No evidence of cervical spinal stenosis.

CT CERVICAL MYELOGRAM FINDINGS:

Small volume myelographic contrast in the basilar cisterns. No acute
osseous abnormality identified. Visible mastoid air cells are clear.
Negative visualized noncontrast neck soft tissues and superior
mediastinum. Negative lung apices.

C2-C3:  Negative.

C3-C4:  Negative.

C4-C5: Broad-based left paracentral small disc protrusion effaces
the ventral CSF space without spinal stenosis (series 10, images 46
and 47). No foraminal involvement.

C5-C6: Circumferential disc bulge and endplate spurring. Broad-based
but left eccentric and mildly lobulated posterior disc protrusion
best seen on series 10, image 55 (compare to series 9, image 18 of
the [facility] MRI). Disc material tracks toward the left C6 neural
foramen. However, there not high-grade foraminal narrowing - see
series 10, image 52. Effaced ventral CSF space but no spinal
stenosis.

C6-C7: Mild circumferential disc bulge with superimposed small
central disc protrusion effacing the ventral CSF space without
spinal stenosis (series 10 image 63). No foraminal stenosis.

C7-T1:  Negative.

T1-T2:  Negative.
IMPRESSION: Small disc protrusions from C4-C5 to C6-C7. Those at C4-C5 and C5-C6
are eccentric to the left, and a small C5-C6 struck back could the
C6 neural foramen as demonstrated on the [facility] MRI. Query left C6
radiculitis. Effaced ventral CSF space at these levels with no
spinal stenosis.

## 2019-01-11 NOTE — Telephone Encounter (Signed)
R/s appt per 3/12 sch message - pt aware of new appt date and time   

## 2019-01-11 NOTE — Telephone Encounter (Signed)
Faxed last ov note and referral to Dr. Harlow Mares office.

## 2019-01-15 DIAGNOSIS — Z853 Personal history of malignant neoplasm of breast: Secondary | ICD-10-CM | POA: Diagnosis not present

## 2019-01-17 ENCOUNTER — Telehealth: Payer: Self-pay | Admitting: Hematology

## 2019-01-17 NOTE — Telephone Encounter (Signed)
Rescheduled appt per 3/18 sch message- pt aware of new appt date and time

## 2019-01-18 ENCOUNTER — Inpatient Hospital Stay: Payer: Medicare HMO

## 2019-01-18 ENCOUNTER — Inpatient Hospital Stay: Payer: Medicare HMO | Admitting: Hematology

## 2019-02-14 ENCOUNTER — Ambulatory Visit: Payer: Self-pay | Admitting: Surgery

## 2019-02-14 DIAGNOSIS — K641 Second degree hemorrhoids: Secondary | ICD-10-CM | POA: Diagnosis not present

## 2019-02-14 DIAGNOSIS — K620 Anal polyp: Secondary | ICD-10-CM | POA: Diagnosis not present

## 2019-02-14 DIAGNOSIS — K644 Residual hemorrhoidal skin tags: Secondary | ICD-10-CM | POA: Diagnosis not present

## 2019-02-14 DIAGNOSIS — K625 Hemorrhage of anus and rectum: Secondary | ICD-10-CM | POA: Diagnosis not present

## 2019-02-14 NOTE — H&P (Signed)
Victoria Delacruz Documented: 02/14/2019 1:54 PM Location: [facility] Patient #: [id number] DOB: May 26, 1971 Single / Language: Cleophus Molt / Race: Black or African American Female  History of Present Illness Adin Hector MD; 02/14/2019 2:32 PM) The patient is a 48 year old female who presents with hemorrhoids. Note for "Hemorrhoids": ` ` ` Patient sent for surgical consultation at the request of Dr Harlan Stains  Chief Complaint: Hemorrhoids ` ` The patient is a pleasant woman. Breast cancer survivor and required mastectomies with breast reconstruction. Had recurrence requiring reexcision in 2011. No recurrence since. Recalls having a colonoscopy a few years ago that proved some hemorrhoids that were banded by Dr. Earlean Shawl. No other major GI issues. On further history of breast cancer, she worries about colorectal cancer. She is helpful just hemorrhoids but is concerned. Discussed with primary care physician. Surgical consultation offered.  However she's had intermittent rectal bleeding for the past few months. It concerns her. She thinks they're hemorrhoids. She claims that she tends to get constipated. She is tried to adjust a high fiber diet. With that, she moves her bowels at least every other day. No rectal pain or itching or drainage. Just blood when she wipes. Occasionally passes clots. It concerns her. She is required surgery for an ectopic proximally with a right salpingectomy. She got bad memorrhagia after post-adjuvant chemotherapy and required a palliative hysterectomy. Not for cancer. No other surgeries for over a decade.  No personal nor family history of GI/colon cancer, inflammatory bowel disease, irritable bowel syndrome, allergy such as Celiac Sprue, dietary/dairy problems, colitis, ulcers nor gastritis. No recent sick contacts/gastroenteritis. No travel outside the country. No changes in diet. No dysphagia to solids or liquids. No significant  heartburn or reflux. No hematochezia, hematemesis, coffee ground emesis. No evidence of prior gastric/peptic ulceration.  (Review of systems as stated in this history (HPI) or in the review of systems. Otherwise all other 12 point ROS are negative) ` ` `   Past Surgical History Nance Pew, CMA; 02/14/2019 1:54 PM) Breast Augmentation Bilateral, Left. Breast Biopsy Left. multiple Breast Reconstruction Left. Hysterectomy (not due to cancer) - Partial Mammoplasty; Reduction Right. Mastectomy Left. Oral Surgery  Diagnostic Studies History Nance Pew, CMA; 02/14/2019 1:54 PM) Colonoscopy 1-5 years ago never Mammogram within last year Pap Smear 1-5 years ago  Allergies Nance Pew, CMA; 02/14/2019 1:54 PM) No Known Allergies [02/14/2019]: No Known Drug Allergies [10/28/2014]: Allergies Reconciled  Medication History Nance Pew, CMA; 02/14/2019 1:55 PM) Vitamin D (Cholecalciferol) (1000UNIT Tablet, Oral daily) Active. AmLODIPine Besylate (5MG  Tablet, Oral) Active. Citalopram Hydrobromide (20MG  Tablet, Oral) Active. Potassium Chloride ER (10MEQ Tablet ER, Oral) Active. Propranolol HCl (40MG  Tablet, Oral) Active. Flexeril (10MG  Tablet, Oral) Active. Cymbalta (20MG  Capsule DR Part, Oral) Active. Propranolol HCl (20MG  Tablet, Oral) Active. Medications Reconciled  Social History Nance Pew, CMA; 02/14/2019 1:54 PM) Alcohol use Occasional alcohol use. Caffeine use Carbonated beverages, Tea. No drug use Tobacco use Never smoker.  Family History Nance Pew, [place]; 02/14/2019 1:54 PM) Arthritis Mother, Sister. Cancer Sister. Diabetes Mellitus Mother. Hypertension Brother, Father, Mother, Sister. Respiratory Condition Father.  Pregnancy / Birth History Nance Pew, [place]; 02/14/2019 1:54 PM) Age at menarche 49 years. Gravida 2 Irregular periods Maternal age 64-25 Para 0  Other Problems Nance Pew, CMA; 02/14/2019 1:54  PM) Anxiety Disorder Breast Cancer Gastroesophageal Reflux Disease Hemorrhoids High blood pressure Other disease, cancer, significant illness Transfusion history     Review of Systems ([place]; 02/14/2019 1:54 PM) General Present- Weight  Gain. Not Present- Appetite Loss, Chills, Fatigue, Fever, Night Sweats and Weight Loss. Respiratory Not Present- Bloody sputum, Chronic Cough, Difficulty Breathing, Snoring and Wheezing. Breast Not Present- Breast Mass, Breast Pain, Nipple Discharge and Skin Changes. Gastrointestinal Present- Abdominal Pain, Bloating, Bloody Stool, Constipation, Excessive gas and Hemorrhoids. Not Present- Change in Bowel Habits, Chronic diarrhea, Difficulty Swallowing, Gets full quickly at meals, Indigestion, Nausea, Rectal Pain and Vomiting. Female Genitourinary Present- Nocturia. Not Present- Frequency, Painful Urination, Pelvic Pain and Urgency. Musculoskeletal Present- Back Pain, Joint Stiffness, Muscle Pain and Swelling of Extremities. Not Present- Joint Pain and Muscle Weakness. Endocrine Present- Hot flashes. Not Present- Cold Intolerance, Excessive Hunger, Hair Changes, Heat Intolerance and New Diabetes.  Vitals (Sabrina Canty CMA; 02/14/2019 1:55 PM) 02/14/2019 1:55 PM Weight: 177 lb Height: 63in Body Surface Area: 1.84 m Body Mass Index: 31.35 kg/m  Temp.: 98.24F(Oral)  Pulse: 98 (Regular)  BP: 144/84 (Sitting, Left Arm, Standard)      Physical Exam Adin Hector MD; 02/14/2019 2:37 PM)  General Mental Status-Alert. General Appearance-Not in acute distress, Not Sickly. Orientation-Oriented X3. Hydration-Well hydrated. Voice-Normal.  Integumentary Global Assessment Upon inspection and palpation of skin surfaces of the - Axillae: non-tender, no inflammation or ulceration, no drainage. and Distribution of scalp and body hair is normal. General Characteristics Temperature - normal warmth is noted.  Head and  Neck Head-normocephalic, atraumatic with no lesions or palpable masses. Face Global Assessment - atraumatic, no absence of expression. Neck Global Assessment - no abnormal movements, no bruit auscultated on the right, no bruit auscultated on the left, no decreased range of motion, non-tender. Trachea-midline. Thyroid Gland Characteristics - non-tender.  Eye Eyeball - Left-Extraocular movements intact, No Nystagmus. Eyeball - Right-Extraocular movements intact, No Nystagmus. Cornea - Left-No Hazy. Cornea - Right-No Hazy. Sclera/Conjunctiva - Left-No scleral icterus, No Discharge. Sclera/Conjunctiva - Right-No scleral icterus, No Discharge. Pupil - Left-Direct reaction to light normal. Pupil - Right-Direct reaction to light normal.  ENMT Ears Pinna - Left - no drainage observed, no generalized tenderness observed. Right - no drainage observed, no generalized tenderness observed. Nose and Sinuses External Inspection of the Nose - no destructive lesion observed. Inspection of the nares - Left - quiet respiration. Right - quiet respiration. Mouth and Throat Lips - Upper Lip - no fissures observed, no pallor noted. Lower Lip - no fissures observed, no pallor noted. Nasopharynx - no discharge present. Oral Cavity/Oropharynx - Tongue - no dryness observed. Oral Mucosa - no cyanosis observed. Hypopharynx - no evidence of airway distress observed.  Chest and Lung Exam Inspection Movements - Normal and Symmetrical. Accessory muscles - No use of accessory muscles in breathing. Palpation Palpation of the chest reveals - Non-tender. Auscultation Breath sounds - Normal and Clear.  Cardiovascular Auscultation Rhythm - Regular. Murmurs & Other Heart Sounds - Auscultation of the heart reveals - No Murmurs and No Systolic Clicks.  Abdomen Inspection Inspection of the abdomen reveals - No Visible peristalsis and No Abnormal pulsations. Umbilicus - No Bleeding, No Urine  drainage. Palpation/Percussion Palpation and Percussion of the abdomen reveal - Soft, Non Tender, No Rebound tenderness, No Rigidity (guarding) and No Cutaneous hyperesthesia. Note: Abdomen soft. Not severely distended. No distasis recti. No umbilical or other anterior abdominal wall hernias  Female Genitourinary Sexual Maturity Tanner 5 - Adult hair pattern. Note: No vaginal bleeding nor discharge  Rectal Note: External hemorrhoids with tags 3. Irritated internal hemorrhoids friable bleeding. Right anterior more than right posterior suspicious than left lateral. Enlarged anal crypt polyp on  right posterior aspect that prolapses. No definite rectal mass is felt to 8 cm by digital exam  Peripheral Vascular Upper Extremity Inspection - Left - No Cyanotic nailbeds, Not Ischemic. Right - No Cyanotic nailbeds, Not Ischemic.  Neurologic Neurologic evaluation reveals -normal attention span and ability to concentrate, able to name objects and repeat phrases. Appropriate fund of knowledge , normal sensation and normal coordination. Mental Status Affect - not angry, not paranoid. Cranial Nerves-Normal Bilaterally. Gait-Normal.  Neuropsychiatric Mental status exam performed with findings of-able to articulate well with normal speech/language, rate, volume and coherence, thought content normal with ability to perform basic computations and apply abstract reasoning and no evidence of hallucinations, delusions, obsessions or homicidal/suicidal ideation.  Musculoskeletal Global Assessment Spine, Ribs and Pelvis - no instability, subluxation or laxity. Right Upper Extremity - no instability, subluxation or laxity.  Lymphatic Head & Neck  General Head & Neck Lymphatics: Bilateral - Description - No Localized lymphadenopathy. Axillary  General Axillary Region: Bilateral - Description - No Localized lymphadenopathy. Femoral & Inguinal  Generalized Femoral & Inguinal  Lymphatics: Left - Description - No Localized lymphadenopathy. Right - Description - No Localized lymphadenopathy.   Results Adin Hector MD; 02/14/2019 2:49 PM) Procedures  Name Value Date Hemorrhoids Procedure Anal exam: External Hemorrhoid Internal exam: Internal Hemorrhoids (bleeding) Internal Hemorroids ( non-bleeding) Mass prolapse Other: External hemorrhoids with tags 3. Epithelialized and not ulcerated nor bleeding. Irritated internal hemorrhoids friable. Some scant bright red blood and clots and anal canal suspicious for hemorrhoidal source. Right anterior more than right posterior suspicious than left lateral. Enlarged anal crypt polyp on right posterior aspect that prolapses. No other definite rectal mass is felt to 8 cm by digital exam...........Marland KitchenPerianal skin clean with good hygiene. No pruritis ani. No pilonidal disease. No fissure. No abscess/fistula. Normal sphincter tone. No condyloma warts. Tolerates digital and anoscopic rectal exam. No other rectal masses. ....Marland KitchenMarland KitchenExam done with assistance of female Medical Assistant in the room.  Performed: 02/14/2019 2:18 PM    Assessment & Plan Adin Hector MD; 02/14/2019 2:37 PM)  PAINLESS RECTAL BLEEDING (K62.5) Impression: Intermittent rectal bleeding with some clots. Probably hemorrhoidal in nature. However, with her history of cancer I think it makes sense to consider colonoscopy to make sure not missing anything. She recalls having an underwhelming colonoscopy years ago. She initially thought it was a couple years out, now she thinks it's more than 3. Dr. Earlean Shawl. Like to get records. Consider repeat colonoscopy. That may be delayed to the Covid epidemic. It may be that we do with hemorrhoid surgery first and then colonoscopy thereafter.  Current Plans Instructed to schedule colonoscopywith a gastroenterologist  PROLAPSED INTERNAL HEMORRHOIDS, GRADE 2 (K64.1) Impression: I suspect  the bleeding is probably from her hemorrhoids. It is hard to tell on examination. The 2 right-sided are enlarged. I think she would benefit from hemorrhoidal ligation faxing probable hemorrhoidectomy. The rather contiguous with her external hemorrhoids, some most likely they will have to be removed as well. Outpatient surgery. I did caution her that she will have snipped and postoperative pain first week or so especially, and then gradually resolving. Intermittent bleeding and drainage and leaking is, in the first month or so. Eventually does resolve. She is leaning toward surgery  The anatomy & physiology of the anorectal region was discussed. The pathophysiology of hemorrhoids and differential diagnosis was discussed. Natural history progression was discussed. I stressed the importance of a bowel regimen to have daily soft bowel movements to minimize progression of disease.  Goal of one BM / day ideal. Use of wet wipes, warm baths, avoiding straining, etc were emphasized.  Educational handouts further explaining the pathology, treatment options, and bowel regimen were given as well. The patient expressed understanding.  Current Plans ANOSCOPY, DIAGNOSTIC (07371) Pt Education - [facility] Hemorrhoids (Olisa Quesnel): discussed with patient and provided information. Pt Education - Pamphlet Given - The Hemorrhoid Book: discussed with patient and provided information. The anatomy & physiology of the anorectal region was discussed. The pathophysiology of hemorrhoids and differential diagnosis was discussed. Natural history risks without surgery was discussed. I stressed the importance of a bowel regimen to have daily soft bowel movements to minimize progression of disease. Interventions such as sclerotherapy & banding were discussed.  The patient's symptoms are not adequately controlled by medicines and other non-operative treatments. I feel the risks & problems of no surgery outweigh the operative risks; therefore,  I recommended surgery to treat the hemorrhoids by ligation, pexy, and possible resection.  Risks such as bleeding, infection, urinary difficulties, need for further treatment, heart attack, death, and other risks were discussed. I noted a good likelihood this will help address the problem. Goals of post-operative recovery were discussed as well. Possibility that this will not correct all symptoms was explained. Post-operative pain, bleeding, constipation, and other problems after surgery were discussed. We will work to minimize complications. Educational handouts further explaining the pathology, treatment options, and bowel regimen were given as well. Questions were answered. The patient expresses understanding & wishes to proceed with surgery.   POLYP OF ANAL VERGE (K62.0) Impression: Smooth pedunculate mass on right posterior anal canal. Most likely a hypertrophied anal crypt polyp that intermittently prolapses. Would remove this at the time of surgery as well.  Current Plans You are being scheduled for surgery- Our schedulers will call you.  You should hear from our office's scheduling department within 5 working days about the location, date, and time of surgery. We try to make accommodations for patient's preferences in scheduling surgery, but sometimes the OR schedule or the surgeon's schedule prevents [place] from making those accommodations.  If you have not heard from our office [phone]) in 5 working days, call the office and ask for your surgeon's nurse.  If you have other questions about your diagnosis, plan, or surgery, call the office and ask for your surgeon's nurse.   EXTERNAL HEMORRHOIDS WITH COMPLICATION (E70.3) Impression: Medium-sized external hemorrhoids 3. Most likely will have to be removed at the time of surgery. We'll try and hold off on being too elaborate since she does not have major pruritus or itching from this. However, she's going to have pain  already, I recommended considering definitive surgery to normalize her anatomy as much as possible as long as we can be safe  Current Plans You are being scheduled for surgery- Our schedulers will call you.  You should hear from our office's scheduling department within 5 working days about the location, date, and time of surgery. We try to make accommodations for patient's preferences in scheduling surgery, but sometimes the OR schedule or the surgeon's schedule prevents [place] from making those accommodations.  If you have not heard from our office [phone]) in 5 working days, call the office and ask for your surgeon's nurse.  If you have other questions about your diagnosis, plan, or surgery, call the office and ask for your surgeon's nurse.   ENCOUNTER FOR PREOPERATIVE EXAMINATION FOR GENERAL SURGICAL PROCEDURE (Z01.818)  Current Plans You are being scheduled for surgery- Our schedulers  will call you.  You should hear from our office's scheduling department within 5 working days about the location, date, and time of surgery. We try to make accommodations for patient's preferences in scheduling surgery, but sometimes the OR schedule or the surgeon's schedule prevents [place] from making those accommodations.  If you have not heard from our office [phone] in 5 working days, call the office and ask for your surgeon's nurse.  If you have other questions about your diagnosis, plan, or surgery, call the office and ask for your surgeon's nurse.  Pt Education - [facility] Rectal Prep for Anorectal outpatient/office surgery: discussed with patient and provided information. Pt Education - [facility] Rectal Surgery HCI (Lukus Binion): discussed with patient and provided information. Pt Education - [facility] Good Bowel Health (Yandell Mcjunkins)  Adin Hector, MD, FACS, MASCRS Gastrointestinal and Minimally Invasive Surgery    [address] [place], [place] [address] [phone] Main / Paging [phone] Fax

## 2019-03-07 DIAGNOSIS — F419 Anxiety disorder, unspecified: Secondary | ICD-10-CM | POA: Diagnosis not present

## 2019-03-07 DIAGNOSIS — I10 Essential (primary) hypertension: Secondary | ICD-10-CM | POA: Diagnosis not present

## 2019-03-07 DIAGNOSIS — M797 Fibromyalgia: Secondary | ICD-10-CM | POA: Diagnosis not present

## 2019-03-12 ENCOUNTER — Ambulatory Visit: Payer: Self-pay | Admitting: Surgery

## 2019-03-21 ENCOUNTER — Inpatient Hospital Stay: Payer: Medicare HMO

## 2019-03-21 ENCOUNTER — Inpatient Hospital Stay: Payer: Medicare HMO | Admitting: Hematology

## 2019-03-27 ENCOUNTER — Telehealth: Payer: Self-pay | Admitting: Hematology

## 2019-03-27 NOTE — Telephone Encounter (Signed)
Called patient per 5/26 sch message - no answer . Left message for patient with direct number to reschedule.

## 2019-04-09 NOTE — Progress Notes (Signed)
[place]   [phone] Fax:([phone]) [phone]   Clinic Follow up Note   Patient Care Team: Harlan Stains, MD as PCP - General (Family Medicine) [name], MD as Consulting Physician (General Surgery) Richmond Campbell, MD as Consulting Physician (Gastroenterology) Crissie Reese, MD as Consulting Physician (Plastic Surgery) Michael Boston, MD as Consulting Physician (Colon and Rectal Surgery)  Date of Service:  04/12/2019  CHIEF COMPLAINT: F/u of left breast cancer   SUMMARY OF ONCOLOGIC HISTORY: Oncology History  Malignant neoplasm of female breast ([place])  08/02/2003 Initial Diagnosis   MALIGNANT NEOPLASM OF BREAST UNSPECIFIED SITE. High-grade DCIS of Left breast; Tumor was 4.0 cm. Sentinel lymph nodes were negative.  ER, 24%; PR 2%.  Patient had saline implant.    08/02/2003 Surgery   L mastectomy with reconstruction [name]   05/07/2010 Surgery   R port-a-cath placement   05/11/2010 Relapse/Recurrence   Invasive ductal carcinoma of L breast; ER: 99%, positive; PR: 53%, positive; Ki 67 (mib-1): 20%; hER@ neu by FISH without ampliication; ratio of her 2 CEP 17 was 1.17.    05/28/2010 Surgery   Local excision of the L invasive ductal carcinoma by [name].   06/08/2010 - 08/10/2010 Chemotherapy   Completed 4 cycles of adjuvant chemotherapy with cytoxan and taxotere in combination with neulasta    09/08/2010 - 11/04/2010 Radiation Therapy   XRT was given to the left breast and chall wall consisting of 4780 in 26 fractions with an 1800 cGy boost in 9 fractions Dr Valere Dross.   11/04/2010 - 04/17/2011 Chemotherapy   Tamoxifen taken sporadically and discontinued due to vaginal bleeding.    08/30/2011 Surgery   Laparoscopic assisted vaginal hysterectomy.   04/10/2012 Imaging   MRI of the brain normal.  No cause of the patient's headache identified.    12/17/2012 Imaging   CT scan of abdomen and pelvis showed no acute findings in the abdomen or pelvis.   There were felt to be hepatic hemangiomas present, mesuring 2.5 cm in the superior right heaptic lobe at the hepatic dome and a 7 mm lesion located anteriorly.    08/23/2013 Imaging   Digital diagnostic unilateral right mammogram showed no mamographic or sonographic evidence of malignancy, right breast.      09/11/2013 -  Anti-estrogen oral therapy   Tamoxifen 20 mg once daily, planning for 10 years.   11/26/2016 Imaging   MR Cervical Spine W WO Contrast IMPRESSION: Small left sided disc protrusion at C5-6 extending into the foramen. This could be a source of radiculopathy involving the left C6 nerve root. Diffusely abnormal bone marrow likely related to prior chemotherapy or anemia.   03/17/2017 Imaging   US Abdomen IMPRESSION: No acute or focal abnormality identified.    03/17/2017 Imaging   US Abdomen Complete  IMPRESSION: No acute or focal abnormality identified.   03/23/2018 Imaging   03/23/2018 Mammogram IMPRESSION: No evidence of malignancy.   09/12/2018 Surgery   She underwent breast reconstruction on 09/12/2018 with Dr Harlow Mares      CURRENT THERAPY:  Tamoxifen started in 11/2009, held 04/2011-08/2013, and restarted on 09/11/2013   INTERVAL HISTORY:  Victoria Delacruz is here for a follow up of left breast cancer. She presents to the clinic alone. She notes she is doing well. She mainly stays home. She notes having breast reconstruction in 09/2018 with Dr Harlow Mares. She is happy with the results.  She notes GI bleeding. She plans to have surgery on hemorrhoids on 05/17/19 with Dr Sheliah Plane. Afterward  she will be checked for colon cancer by colonoscopy.  She notes lower sore throat which is recurrent. She does not have this currently. She denies having allergies. She also notes being more forgetful as well. She notes having mild trouble swallowing things and may aspirate and vomit. She will see blood in emesis at times. This can happen with bread and meat occasionally. She notes now  she eats once a day.  She notes fluid feeling in her ears when moving head form side to side.    REVIEW OF SYSTEMS:   Constitutional: Denies fevers, chills or abnormal weight loss (+) Low appetite  Eyes: Denies blurriness of vision Ears, nose, mouth, throat, and face: Denies mucositis (+) recurrent sore throat, mild aspiration with eating Respiratory: Denies cough, dyspnea or wheezes Cardiovascular: Denies palpitation, chest discomfort or lower extremity swelling Gastrointestinal:  Denies nausea, heartburn (+) GI bleeding Skin: Denies abnormal skin rashes Lymphatics: Denies new lymphadenopathy or easy bruising Neurological:Denies numbness, tingling or new weaknesses Behavioral/Psych: Mood is stable, no new changes (+) More forgetful  All other systems were reviewed with the patient and are negative.  MEDICAL HISTORY:  Past Medical History:  Diagnosis Date   Anemia    Arthritis    Blood transfusion 05/17/11, 05/25/11   anemia   Breast cancer ([place]) 2004 and 2011   Depression    Dizziness    ECTOPIC PREGNANCY 1997 and 2011   x 2   Family history of breast cancer    grandmother   Fibroid    Fibromyalgia    Hypertension    Personal history of radiation therapy 2011    SURGICAL HISTORY: Past Surgical History:  Procedure Laterality Date   AUGMENTATION MAMMAPLASTY Left    BREAST BIOPSY Left 07/05/2003   malignant   BREAST BIOPSY Left 2011   malignant   LAPAROSCOPIC ASSISTED VAGINAL HYSTERECTOMY  08/30/2011   Procedure: LAPAROSCOPIC ASSISTED VAGINAL HYSTERECTOMY;  Surgeon: Osborne Oman, MD;  Location: [facility];  Service: Gynecology;  Laterality: N/A;   LAPAROSCOPY FOR ECTOPIC PREGNANCY     LAPAROSCOPY W/ MINI-LAPAROTOMY     MASTECTOMY Left 2004   complete with reconstruction x 3, no b/p punctures to left arm   RECONSTRUCTION BREAST IMMEDIATE / DELAYED W/ TISSUE EXPANDER  2004   REDUCTION MAMMAPLASTY Right     I have reviewed the social history and  family history with the patient and they are unchanged from previous note.  ALLERGIES:  has No Known Allergies.  MEDICATIONS:  Current Outpatient Medications  Medication Sig Dispense Refill   amLODipine (NORVASC) 5 MG tablet Take 1 tablet (5 mg total) by mouth daily. 90 tablet 3   aspirin-acetaminophen-caffeine (EXCEDRIN MIGRAINE) 149-702-63 MG per tablet Take 1 tablet by mouth every 6 (six) hours as needed for headache.     cholecalciferol (VITAMIN D) 1000 units tablet Take 5,000 Units by mouth daily.     cyclobenzaprine (FLEXERIL) 10 MG tablet Take 1 tablet (10 mg total) by mouth at bedtime. (Patient taking differently: Take 10 mg by mouth at bedtime as needed. ) 20 tablet 0   ergocalciferol (VITAMIN D2) 50000 units capsule Take 1 capsule (50,000 Units total) by mouth once a week. Take 50,000 units by mouth weekly  X 8. 8 capsule 0   meloxicam (MOBIC) 15 MG tablet Take 15 mg by mouth 2 (two) times daily as needed for pain.   0   polyethylene glycol powder (GLYCOLAX/MIRALAX) powder Take 17 g by mouth daily. (Patient taking differently: Take 17  g by mouth as needed. ) 3350 g 1   pregabalin (LYRICA) 25 MG capsule Take 25-50 mg by mouth See admin instructions. Taking 1 cap in the morning, and 2 caps in the evening     propranolol (INDERAL) 40 MG tablet Take 20 mg by mouth 2 (two) times daily.  0   SUMAtriptan (IMITREX) 100 MG tablet Take 1 tablet at earliest onset of headache.  May repeat once in 2 hours if headache persists or recurs. (Patient taking differently: Take 100 mg by mouth every 2 (two) hours as needed for migraine. Take 1 tablet at earliest onset of headache.  May repeat once in 2 hours if headache persists or recurs.) 10 tablet 2   tamoxifen (NOLVADEX) 20 MG tablet Take 1 tablet (20 mg total) by mouth daily. 90 tablet 3   topiramate (TOPAMAX) 50 MG tablet Take 1 tablet (50 mg total) by mouth at bedtime. 30 tablet 2   No current facility-administered medications for this  visit.     PHYSICAL EXAMINATION: ECOG PERFORMANCE STATUS: 1 - Symptomatic but completely ambulatory  Vitals:   04/12/19 1005  BP: (!) 130/91  Pulse: 61  Resp: 18  Temp: 98.7 F (37.1 C)  SpO2: 100%   Filed Weights   04/12/19 1005  Weight: 170 lb 8 oz (77.3 kg)    GENERAL:alert, no distress and comfortable SKIN: skin color, texture, turgor are normal, no rashes or significant lesions EYES: normal, Conjunctiva are pink and non-injected, sclera clear  NECK: supple, thyroid normal size, non-tender, without nodularity LYMPH:  no palpable lymphadenopathy in the cervical, axillary  LUNGS: clear to auscultation and percussion with normal breathing effort HEART: regular rate & rhythm and no murmurs and no lower extremity edema ABDOMEN:abdomen soft, non-tender and normal bowel sounds Musculoskeletal:no cyanosis of digits and no clubbing  NEURO: alert & oriented x 3 with fluent speech, no focal motor/sensory deficits BREAST: S/p left breast mastectomy and reconstruction: Surgical incision healed well (+) No palpable mass or adenopathy.   LABORATORY DATA:  I have reviewed the data as listed CBC Latest Ref Rng & Units 04/12/2019 07/11/2018 01/09/2018  WBC 4.0 - 10.5 K/uL 6.1 8.7 5.8  Hemoglobin 12.0 - 15.0 g/dL 13.2 12.2 13.0  Hematocrit 36.0 - 46.0 % 39.1 36.5 38.8  Platelets 150 - 400 K/uL 251 249 254     CMP Latest Ref Rng & Units 04/12/2019 07/11/2018 01/09/2018  Glucose 70 - 99 mg/dL 125(H) 100(H) 116  BUN 6 - 20 mg/dL _0 Creatinine 0.44 - 1.00 mg/dL 1.02(H) 0.92 0.91  Sodium 135 - 145 mmol/L 141 140 140  Potassium 3.5 - 5.1 mmol/L 3.6 3.3(L) 3.6  Chloride 98 - 111 mmol/L 107 107 106  CO2 22 - 32 mmol/L _1 Calcium 8.9 - 10.3 mg/dL 9.2 9.2 9.2  Total Protein 6.5 - 8.1 g/dL 7.9 7.0 7.6  Total Bilirubin 0.3 - 1.2 mg/dL 0.4 0.5 0.5  Alkaline Phos 38 - 126 U/L 90 87 88  AST 15 - 41 U/L _2 ALT 0 - 44 U/L _3 RADIOGRAPHIC STUDIES: I have personally  reviewed the radiological images as listed and agreed with the findings in the report. No results found.   ASSESSMENT & PLAN:  Victoria Delacruz is a 48 y.o. female with   1. Left breast invasive duct carcinoma (ER+,PR+,HER2 negative) , [id number], stage Ia diagnosed in 2011, with prior left DCIS in 2004 -She  was initially diagnosed with left breast DCIS in 2004. She was treated with Left mastectomy.  -Unfortunately she had left breast cancer recurrence in 05/2010, invasive. She was treated with local excision surgery, adjuvant chemo CT and adjuvant radiation. She underwent breast reconstruction in 09/12/2018 with Dr Harlow Mares. She is satisfied with results.  -She was eligible and previously referred to genetics, but did not go.  -She has been taking Tamoxifen since 11/2010 held due to vaginal bleeding and restarted in 09/2013. She did have hysterectomy.  -From a breast cancer standpoint she is clinically doing well. Lab reviewed, her CBC and CMP are within normal limits except BG 125, Cr 1.02. Vit D still pending. Her physical exam and her 03/2018 mammogram were unremarkable. There is no clinical concern for recurrence. -Continue surveillance with breast exams. She will proceed with next mammogram this month  -F/u in 6 months   2. HTN, Fibromyalgia, chronic body pain  -She follows up with her primary care physician.  3. Hypokalemia -resolved on labs today   4. Right sided headaches with blurry vision bilaterally  -Her chronic headaches are unlikely related to her breast cancer, she had an unremarkable Brain MRI in June 2013 -She has history of migraine headache -She will follow up with her primary care physician. -Stable   5. rectal bleeding -Last colonoscopy in 2017 with Dr Earlean Shawl -she has had recurrent rectal bleeding from internal hemorrhoids. She will have surgery on this for 05/17/19 with Dr Johney Maine.  -After surgery she plans to have colonoscopy from colon cancer screening.    6.  Recurrent sore throat, ear and neck discomfort -it has been intermittent  -I discussed this can be from allergies or Acid reflux. I encouraged her to try OTC allergy medication and Zantac.  -If not improved she can see ENT and GI for possible endoscopy.   Plan -continue tamoxifen -Lab and f/u in 6 months  -[facility] in 2-3 weeks    No problem-specific Assessment & Plan notes found for this encounter.   Orders Placed This Encounter  Procedures   MM Digital Screening Unilat R    Standing Status:   Future    Standing Expiration Date:   04/11/2020    Order Specific Question:   Reason for Exam (SYMPTOM  OR DIAGNOSIS REQUIRED)    Answer:   screening    Order Specific Question:   Is the patient pregnant?    Answer:   No    Order Specific Question:   Preferred imaging location?    Answer:   [facility]   All questions were answered. The patient knows to call the clinic with any problems, questions or concerns. No barriers to learning was detected. I spent 20 minutes counseling the patient face to face. The total time spent in the appointment was 25 minutes and more than 50% was on counseling and review of test results     Truitt Merle, MD 04/12/2019   I, Joslyn Devon, am acting as scribe for Truitt Merle, MD.   I have reviewed the above documentation for accuracy and completeness, and I agree with the above.

## 2019-04-12 ENCOUNTER — Other Ambulatory Visit: Payer: Self-pay

## 2019-04-12 ENCOUNTER — Encounter: Payer: Self-pay | Admitting: Hematology

## 2019-04-12 ENCOUNTER — Inpatient Hospital Stay: Payer: Medicare HMO | Attending: Hematology

## 2019-04-12 ENCOUNTER — Inpatient Hospital Stay (HOSPITAL_BASED_OUTPATIENT_CLINIC_OR_DEPARTMENT_OTHER): Payer: Medicare HMO | Admitting: Hematology

## 2019-04-12 VITALS — BP 130/91 | HR 61 | Temp 98.7°F | Resp 18 | Ht 63.0 in | Wt 170.5 lb

## 2019-04-12 DIAGNOSIS — C50412 Malignant neoplasm of upper-outer quadrant of left female breast: Secondary | ICD-10-CM

## 2019-04-12 DIAGNOSIS — K648 Other hemorrhoids: Secondary | ICD-10-CM | POA: Insufficient documentation

## 2019-04-12 DIAGNOSIS — G43909 Migraine, unspecified, not intractable, without status migrainosus: Secondary | ICD-10-CM

## 2019-04-12 DIAGNOSIS — I10 Essential (primary) hypertension: Secondary | ICD-10-CM | POA: Insufficient documentation

## 2019-04-12 DIAGNOSIS — E876 Hypokalemia: Secondary | ICD-10-CM

## 2019-04-12 DIAGNOSIS — Z79810 Long term (current) use of selective estrogen receptor modulators (SERMs): Secondary | ICD-10-CM | POA: Diagnosis not present

## 2019-04-12 DIAGNOSIS — Z170 Estrogen receptor positive status [ER+]: Secondary | ICD-10-CM | POA: Insufficient documentation

## 2019-04-12 DIAGNOSIS — C50912 Malignant neoplasm of unspecified site of left female breast: Secondary | ICD-10-CM | POA: Diagnosis not present

## 2019-04-12 LAB — COMPREHENSIVE METABOLIC PANEL
ALT: 14 U/L (ref 0–44)
AST: 21 U/L (ref 15–41)
Albumin: 3.9 g/dL (ref 3.5–5.0)
Alkaline Phosphatase: 90 U/L (ref 38–126)
Anion gap: 9 (ref 5–15)
BUN: 9 mg/dL (ref 6–20)
CO2: 25 mmol/L (ref 22–32)
Calcium: 9.2 mg/dL (ref 8.9–10.3)
Chloride: 107 mmol/L (ref 98–111)
Creatinine, Ser: 1.02 mg/dL — ABNORMAL HIGH (ref 0.44–1.00)
GFR calc Af Amer: >60 mL/min (ref >60)
GFR calc non Af Amer: >60 mL/min (ref >60)
Glucose, Bld: 125 mg/dL — ABNORMAL HIGH (ref 70–99)
Potassium: 3.6 mmol/L (ref 3.5–5.1)
Sodium: 141 mmol/L (ref 135–145)
Total Bilirubin: 0.4 mg/dL (ref 0.3–1.2)
Total Protein: 7.9 g/dL (ref 6.5–8.1)

## 2019-04-12 LAB — CBC WITH DIFFERENTIAL/PLATELET
Abs Immature Granulocytes: 0 10*3/uL (ref 0.00–0.07)
Basophils Absolute: 0.1 10*3/uL (ref 0.0–0.1)
Basophils Relative: 1 %
Eosinophils Absolute: 0.1 10*3/uL (ref 0.0–0.5)
Eosinophils Relative: 2 %
HCT: 39.1 % (ref 36.0–46.0)
Hemoglobin: 13.2 g/dL (ref 12.0–15.0)
Immature Granulocytes: 0 %
Lymphocytes Relative: 38 %
Lymphs Abs: 2.3 10*3/uL (ref 0.7–4.0)
MCH: 30.8 pg (ref 26.0–34.0)
MCHC: 33.8 g/dL (ref 30.0–36.0)
MCV: 91.4 fL (ref 80.0–100.0)
Monocytes Absolute: 0.3 10*3/uL (ref 0.1–1.0)
Monocytes Relative: 4 %
Neutro Abs: 3.4 10*3/uL (ref 1.7–7.7)
Neutrophils Relative %: 55 %
Platelets: 251 10*3/uL (ref 150–400)
RBC: 4.28 MIL/uL (ref 3.87–5.11)
RDW: 13 % (ref 11.5–15.5)
WBC: 6.1 10*3/uL (ref 4.0–10.5)
nRBC: 0 % (ref 0.0–0.2)

## 2019-04-12 MED ORDER — TAMOXIFEN CITRATE 20 MG PO TABS: 20.0000 mg | ORAL_TABLET | Freq: Every day | ORAL | 3 refills | Status: DC

## 2019-04-13 ENCOUNTER — Telehealth: Payer: Self-pay | Admitting: Hematology

## 2019-04-13 ENCOUNTER — Other Ambulatory Visit: Payer: Self-pay | Admitting: Hematology

## 2019-04-13 LAB — VITAMIN D 25 HYDROXY (VIT D DEFICIENCY, FRACTURES): Vit D, 25-Hydroxy: 29.3 ng/mL — ABNORMAL LOW (ref 30.0–100.0)

## 2019-04-13 NOTE — Telephone Encounter (Signed)
Scheduled appt per 6/11 los. A calendar will be mailed out. °

## 2019-04-16 ENCOUNTER — Telehealth: Payer: Self-pay

## 2019-04-16 NOTE — Telephone Encounter (Signed)
-----   Message from Truitt Merle, MD sent at 04/14/2019  2:06 PM EDT ----- Please let pt know her vitD level is still slightly low, but better than before, encourage her to increase VitD intake, thanks   Truitt Merle  04/14/2019

## 2019-04-16 NOTE — Telephone Encounter (Signed)
Left voice message regarding lab results, per Dr. Burr Medico informed her that Vitamin D level is still slightly low, but better than before, instructed her to increase her Vitamin D intake.  Encouraged her to call back if she has any questions.

## 2019-04-23 DIAGNOSIS — J301 Allergic rhinitis due to pollen: Secondary | ICD-10-CM | POA: Diagnosis not present

## 2019-04-23 DIAGNOSIS — M5030 Other cervical disc degeneration, unspecified cervical region: Secondary | ICD-10-CM | POA: Diagnosis not present

## 2019-04-23 DIAGNOSIS — R1111 Vomiting without nausea: Secondary | ICD-10-CM | POA: Diagnosis not present

## 2019-05-11 MED ORDER — METRONIDAZOLE IN NACL 5-0.79 MG/ML-% IV SOLN
500.0000 mg | INTRAVENOUS | Status: AC
  Filled 2019-05-11: qty 100

## 2019-05-11 MED ORDER — CHLORHEXIDINE GLUCONATE CLOTH 2 % EX PADS
6.0000 | MEDICATED_PAD | Freq: Once | CUTANEOUS | Status: DC
  Filled 2019-05-11: qty 6

## 2019-05-11 MED ORDER — CEFAZOLIN SODIUM-DEXTROSE 2-4 GM/100ML-% IV SOLN
2.0000 g | INTRAVENOUS | Status: AC
  Filled 2019-05-11: qty 100

## 2019-05-14 ENCOUNTER — Other Ambulatory Visit (HOSPITAL_COMMUNITY)
Admission: RE | Admit: 2019-05-14 | Discharge: 2019-05-14 | Disposition: A | Discharge status: Home / Self Care | Payer: Medicare HMO | Source: Ambulatory Visit | Attending: Surgery | Admitting: Surgery

## 2019-05-14 DIAGNOSIS — Z1159 Encounter for screening for other viral diseases: Secondary | ICD-10-CM | POA: Diagnosis not present

## 2019-05-15 LAB — SARS CORONAVIRUS 2 (TAT 6-24 HRS): SARS Coronavirus 2: NEGATIVE

## 2019-05-16 ENCOUNTER — Encounter (HOSPITAL_BASED_OUTPATIENT_CLINIC_OR_DEPARTMENT_OTHER): Payer: Self-pay | Admitting: *Deleted

## 2019-05-16 ENCOUNTER — Other Ambulatory Visit: Payer: Self-pay

## 2019-05-16 NOTE — Anesthesia Preprocedure Evaluation (Addendum)
Anesthesia Evaluation  Patient identified by MRN, date of birth, ID band Patient awake    Reviewed: Allergy & Precautions, H&P , NPO status , Patient's Chart, lab work & pertinent test results  Airway Mallampati: II  TM Distance: >3 FB Neck ROM: Full    Dental no notable dental hx. (+) Teeth Intact, Dental Advisory Given   Pulmonary neg pulmonary ROS,    Pulmonary exam normal breath sounds clear to auscultation       Cardiovascular hypertension, Pt. on medications and Pt. on home beta blockers  Rhythm:Regular Rate:Normal     Neuro/Psych  Headaches, PSYCHIATRIC DISORDERS Depression  Neuromuscular disease    GI/Hepatic negative GI ROS, Neg liver ROS,   Endo/Other  negative endocrine ROS  Renal/GU negative Renal ROS  negative genitourinary   Musculoskeletal  (+) Fibromyalgia -  Abdominal   Peds  Hematology  (+) Blood dyscrasia, anemia , Fe Deficiency Anemia   Anesthesia Other Findings   Reproductive/Obstetrics negative OB ROS                            Anesthesia Physical  Anesthesia Plan  ASA: III  Anesthesia Plan: General   Post-op Pain Management:    Induction: Intravenous  PONV Risk Score and Plan:   Airway Management Planned: Oral ETT and LMA  Additional Equipment:   Intra-op Plan:   Post-operative Plan: Extubation in OR  Informed Consent: I have reviewed the patients History and Physical, chart, labs and discussed the procedure including the risks, benefits and alternatives for the proposed anesthesia with the patient or authorized representative who has indicated his/her understanding and acceptance.     Dental advisory given  Plan Discussed with: CRNA, Anesthesiologist and Surgeon  Anesthesia Plan Comments:         Anesthesia Quick Evaluation

## 2019-05-16 NOTE — Progress Notes (Signed)
Spoke with patient via telephone for pre op appointment. NPO after MN. Patient to take Norvasc and Inderal AM of surgery. Patient verbalized understanding of bowel prep. Arrival time 0530. Discussed with patient she would have to wear a mask DOS, patient verbalized understanding.

## 2019-05-17 ENCOUNTER — Other Ambulatory Visit: Payer: Self-pay

## 2019-05-17 ENCOUNTER — Ambulatory Visit (HOSPITAL_BASED_OUTPATIENT_CLINIC_OR_DEPARTMENT_OTHER)
Admission: RE | Admit: 2019-05-17 | Discharge: 2019-05-17 | Disposition: A | Discharge status: Home / Self Care | Payer: Medicare HMO | Attending: Surgery | Admitting: Surgery

## 2019-05-17 ENCOUNTER — Ambulatory Visit (HOSPITAL_BASED_OUTPATIENT_CLINIC_OR_DEPARTMENT_OTHER): Payer: Medicare HMO | Admitting: Anesthesiology

## 2019-05-17 ENCOUNTER — Encounter (HOSPITAL_BASED_OUTPATIENT_CLINIC_OR_DEPARTMENT_OTHER)
Admission: RE | Disposition: A | Discharge status: Home / Self Care | Payer: Self-pay | Source: Home / Self Care | Attending: Surgery

## 2019-05-17 ENCOUNTER — Encounter (HOSPITAL_BASED_OUTPATIENT_CLINIC_OR_DEPARTMENT_OTHER): Payer: Self-pay

## 2019-05-17 DIAGNOSIS — K6289 Other specified diseases of anus and rectum: Secondary | ICD-10-CM | POA: Diagnosis not present

## 2019-05-17 DIAGNOSIS — K644 Residual hemorrhoidal skin tags: Secondary | ICD-10-CM | POA: Diagnosis not present

## 2019-05-17 DIAGNOSIS — K643 Fourth degree hemorrhoids: Secondary | ICD-10-CM | POA: Diagnosis not present

## 2019-05-17 DIAGNOSIS — Z79899 Other long term (current) drug therapy: Secondary | ICD-10-CM | POA: Insufficient documentation

## 2019-05-17 DIAGNOSIS — K620 Anal polyp: Secondary | ICD-10-CM | POA: Diagnosis not present

## 2019-05-17 DIAGNOSIS — L918 Other hypertrophic disorders of the skin: Secondary | ICD-10-CM | POA: Diagnosis not present

## 2019-05-17 DIAGNOSIS — K648 Other hemorrhoids: Secondary | ICD-10-CM | POA: Diagnosis not present

## 2019-05-17 DIAGNOSIS — I10 Essential (primary) hypertension: Secondary | ICD-10-CM | POA: Insufficient documentation

## 2019-05-17 DIAGNOSIS — Z853 Personal history of malignant neoplasm of breast: Secondary | ICD-10-CM | POA: Insufficient documentation

## 2019-05-17 DIAGNOSIS — F329 Major depressive disorder, single episode, unspecified: Secondary | ICD-10-CM | POA: Diagnosis not present

## 2019-05-17 DIAGNOSIS — K649 Unspecified hemorrhoids: Secondary | ICD-10-CM | POA: Diagnosis not present

## 2019-05-17 HISTORY — PX: EVALUATION UNDER ANESTHESIA WITH HEMORRHOIDECTOMY: SHX5624

## 2019-05-17 SURGERY — EXAM UNDER ANESTHESIA WITH HEMORRHOIDECTOMY
Anesthesia: General | Site: Rectum

## 2019-05-17 MED ORDER — OXYCODONE HCL 5 MG PO TABS: 5.0000 mg | ORAL_TABLET | Freq: Four times a day (QID) | ORAL | 0 refills | Status: DC | PRN

## 2019-05-17 MED ORDER — PROPOFOL 10 MG/ML IV BOLUS
INTRAVENOUS | Status: DC | PRN
  Administered 2019-05-17: 200 mg via INTRAVENOUS

## 2019-05-17 MED ORDER — METRONIDAZOLE IN NACL 5-0.79 MG/ML-% IV SOLN
500.0000 mg | INTRAVENOUS | Status: AC
  Administered 2019-05-17: 500 mg via INTRAVENOUS
  Filled 2019-05-17: qty 100

## 2019-05-17 MED ORDER — SUCCINYLCHOLINE CHLORIDE 200 MG/10ML IV SOSY
PREFILLED_SYRINGE | INTRAVENOUS | Status: AC
  Filled 2019-05-17: qty 10

## 2019-05-17 MED ORDER — FENTANYL CITRATE (PF) 100 MCG/2ML IJ SOLN
INTRAMUSCULAR | Status: DC | PRN
  Administered 2019-05-17 (×2): 50 ug via INTRAVENOUS

## 2019-05-17 MED ORDER — OXYCODONE HCL 5 MG PO TABS
5.0000 mg | ORAL_TABLET | Freq: Once | ORAL | Status: DC | PRN
  Filled 2019-05-17: qty 1

## 2019-05-17 MED ORDER — CELECOXIB 200 MG PO CAPS
ORAL_CAPSULE | ORAL | Status: AC
  Filled 2019-05-17: qty 1

## 2019-05-17 MED ORDER — CELECOXIB 200 MG PO CAPS
200.0000 mg | ORAL_CAPSULE | ORAL | Status: AC
  Administered 2019-05-17: 200 mg via ORAL
  Filled 2019-05-17: qty 1

## 2019-05-17 MED ORDER — ONDANSETRON HCL 4 MG/2ML IJ SOLN
INTRAMUSCULAR | Status: AC
  Filled 2019-05-17: qty 2

## 2019-05-17 MED ORDER — ACETAMINOPHEN 160 MG/5ML PO SOLN
325.0000 mg | ORAL | Status: DC | PRN
  Filled 2019-05-17: qty 20.3

## 2019-05-17 MED ORDER — ONDANSETRON HCL 4 MG/2ML IJ SOLN
4.0000 mg | Freq: Once | INTRAMUSCULAR | Status: DC | PRN
  Filled 2019-05-17: qty 2

## 2019-05-17 MED ORDER — ACETAMINOPHEN 500 MG PO TABS
1000.0000 mg | ORAL_TABLET | ORAL | Status: AC
  Administered 2019-05-17: 1000 mg via ORAL
  Filled 2019-05-17: qty 2

## 2019-05-17 MED ORDER — ARTIFICIAL TEARS OPHTHALMIC OINT
TOPICAL_OINTMENT | OPHTHALMIC | Status: AC
  Filled 2019-05-17: qty 3.5

## 2019-05-17 MED ORDER — MIDAZOLAM HCL 2 MG/2ML IJ SOLN
INTRAMUSCULAR | Status: DC | PRN
  Administered 2019-05-17: 2 mg via INTRAVENOUS

## 2019-05-17 MED ORDER — SUCCINYLCHOLINE CHLORIDE 200 MG/10ML IV SOSY
PREFILLED_SYRINGE | INTRAVENOUS | Status: DC | PRN
  Administered 2019-05-17: 120 mg via INTRAVENOUS

## 2019-05-17 MED ORDER — ROCURONIUM BROMIDE 10 MG/ML (PF) SYRINGE
PREFILLED_SYRINGE | INTRAVENOUS | Status: AC
  Filled 2019-05-17: qty 10

## 2019-05-17 MED ORDER — PROPOFOL 10 MG/ML IV BOLUS
INTRAVENOUS | Status: AC
  Filled 2019-05-17: qty 40

## 2019-05-17 MED ORDER — ONDANSETRON HCL 4 MG/2ML IJ SOLN
INTRAMUSCULAR | Status: DC | PRN
  Administered 2019-05-17: 4 mg via INTRAVENOUS

## 2019-05-17 MED ORDER — LIDOCAINE 2% (20 MG/ML) 5 ML SYRINGE
INTRAMUSCULAR | Status: DC | PRN
  Administered 2019-05-17: 60 mg via INTRAVENOUS

## 2019-05-17 MED ORDER — CEFAZOLIN SODIUM-DEXTROSE 2-4 GM/100ML-% IV SOLN
INTRAVENOUS | Status: AC
  Filled 2019-05-17: qty 100

## 2019-05-17 MED ORDER — BUPIVACAINE LIPOSOME 1.3 % IJ SUSP
20.0000 mL | Freq: Once | INTRAMUSCULAR | Status: DC
  Filled 2019-05-17: qty 20

## 2019-05-17 MED ORDER — BUPIVACAINE-EPINEPHRINE 0.25% -1:200000 IJ SOLN
INTRAMUSCULAR | Status: DC | PRN
  Administered 2019-05-17: 20 mL

## 2019-05-17 MED ORDER — LACTATED RINGERS IV SOLN
INTRAVENOUS | Status: DC
  Administered 2019-05-17: 50 mL/h via INTRAVENOUS
  Administered 2019-05-17: 10:00:00 via INTRAVENOUS
  Filled 2019-05-17: qty 1000

## 2019-05-17 MED ORDER — CEFAZOLIN SODIUM-DEXTROSE 2-4 GM/100ML-% IV SOLN
2.0000 g | INTRAVENOUS | Status: AC
  Administered 2019-05-17: 2 g via INTRAVENOUS
  Filled 2019-05-17: qty 100

## 2019-05-17 MED ORDER — GABAPENTIN 300 MG PO CAPS: 300.0000 mg | ORAL_CAPSULE | Freq: Two times a day (BID) | ORAL | 1 refills | Status: DC

## 2019-05-17 MED ORDER — MIDAZOLAM HCL 2 MG/2ML IJ SOLN
INTRAMUSCULAR | Status: AC
  Filled 2019-05-17: qty 2

## 2019-05-17 MED ORDER — METRONIDAZOLE IN NACL 5-0.79 MG/ML-% IV SOLN
INTRAVENOUS | Status: AC
  Filled 2019-05-17: qty 100

## 2019-05-17 MED ORDER — DIBUCAINE (PERIANAL) 1 % EX OINT
TOPICAL_OINTMENT | CUTANEOUS | Status: DC | PRN
  Administered 2019-05-17: 1 via RECTAL

## 2019-05-17 MED ORDER — FENTANYL CITRATE (PF) 100 MCG/2ML IJ SOLN
25.0000 ug | INTRAMUSCULAR | Status: DC | PRN
  Administered 2019-05-17: 0.5 ug via INTRAVENOUS
  Filled 2019-05-17: qty 1

## 2019-05-17 MED ORDER — CHLORHEXIDINE GLUCONATE CLOTH 2 % EX PADS
6.0000 | MEDICATED_PAD | Freq: Once | CUTANEOUS | Status: DC
  Filled 2019-05-17: qty 6

## 2019-05-17 MED ORDER — ACETAMINOPHEN 500 MG PO TABS
ORAL_TABLET | ORAL | Status: AC
  Filled 2019-05-17: qty 2

## 2019-05-17 MED ORDER — DEXAMETHASONE SODIUM PHOSPHATE 10 MG/ML IJ SOLN
INTRAMUSCULAR | Status: DC | PRN
  Administered 2019-05-17: 5 mg via INTRAVENOUS

## 2019-05-17 MED ORDER — ACETAMINOPHEN 325 MG PO TABS
325.0000 mg | ORAL_TABLET | ORAL | Status: DC | PRN
  Filled 2019-05-17: qty 2

## 2019-05-17 MED ORDER — GABAPENTIN 300 MG PO CAPS
ORAL_CAPSULE | ORAL | Status: AC
  Filled 2019-05-17: qty 1

## 2019-05-17 MED ORDER — MEPERIDINE HCL 25 MG/ML IJ SOLN
6.2500 mg | INTRAMUSCULAR | Status: DC | PRN
  Filled 2019-05-17: qty 1

## 2019-05-17 MED ORDER — GABAPENTIN 300 MG PO CAPS
300.0000 mg | ORAL_CAPSULE | ORAL | Status: AC
  Administered 2019-05-17: 300 mg via ORAL
  Filled 2019-05-17: qty 1

## 2019-05-17 MED ORDER — DEXAMETHASONE SODIUM PHOSPHATE 10 MG/ML IJ SOLN
INTRAMUSCULAR | Status: AC
  Filled 2019-05-17: qty 1

## 2019-05-17 MED ORDER — BUPIVACAINE LIPOSOME 1.3 % IJ SUSP
INTRAMUSCULAR | Status: DC | PRN
  Administered 2019-05-17: 20 mL

## 2019-05-17 MED ORDER — FENTANYL CITRATE (PF) 100 MCG/2ML IJ SOLN
INTRAMUSCULAR | Status: AC
  Filled 2019-05-17: qty 2

## 2019-05-17 MED ORDER — OXYCODONE HCL 5 MG/5ML PO SOLN
5.0000 mg | Freq: Once | ORAL | Status: DC | PRN
  Filled 2019-05-17: qty 5

## 2019-05-17 MED ORDER — LIDOCAINE 2% (20 MG/ML) 5 ML SYRINGE
INTRAMUSCULAR | Status: AC
  Filled 2019-05-17: qty 5

## 2019-05-17 SURGICAL SUPPLY — 64 items
APL SKNCLS STERI-STRIP NONHPOA (GAUZE/BANDAGES/DRESSINGS) ×1
BENZOIN TINCTURE PRP APPL 2/3 (GAUZE/BANDAGES/DRESSINGS) ×2 IMPLANT
BLADE HEX COATED 2.75 (ELECTRODE) ×2 IMPLANT
BLADE SURG 10 STRL SS (BLADE) IMPLANT
BLADE SURG 15 STRL LF DISP TIS (BLADE) ×1 IMPLANT
BLADE SURG 15 STRL SS (BLADE) ×2
BRIEF STRETCH FOR OB PAD LRG (UNDERPADS AND DIAPERS) ×2 IMPLANT
CANISTER SUCTION 1200CC (MISCELLANEOUS) ×2 IMPLANT
COVER BACK TABLE 60X90IN (DRAPES) ×2 IMPLANT
COVER MAYO STAND STRL (DRAPES) ×2 IMPLANT
COVER SURGICAL LIGHT HANDLE (MISCELLANEOUS) ×1 IMPLANT
COVER WAND RF STERILE (DRAPES) ×3 IMPLANT
DECANTER SPIKE VIAL GLASS SM (MISCELLANEOUS) ×2 IMPLANT
DRAPE HYSTEROSCOPY (DRAPE) IMPLANT
DRAPE LAPAROTOMY 100X72 PEDS (DRAPES) ×2 IMPLANT
DRAPE SHEET LG 3/4 BI-LAMINATE (DRAPES) IMPLANT
ELECT NDL TIP 2.8 STRL (NEEDLE) IMPLANT
ELECT NEEDLE TIP 2.8 STRL (NEEDLE) IMPLANT
ELECT REM PT RETURN 9FT ADLT (ELECTROSURGICAL) ×2
ELECTRODE REM PT RTRN 9FT ADLT (ELECTROSURGICAL) ×1 IMPLANT
FILTER STRAW (MISCELLANEOUS) ×1 IMPLANT
GAUZE SPONGE 4X4 12PLY STRL (GAUZE/BANDAGES/DRESSINGS) ×1 IMPLANT
GLOVE BIOGEL PI IND STRL 7.0 (GLOVE) IMPLANT
GLOVE BIOGEL PI IND STRL 7.5 (GLOVE) IMPLANT
GLOVE BIOGEL PI INDICATOR 7.0 (GLOVE) ×1
GLOVE BIOGEL PI INDICATOR 7.5 (GLOVE) ×2
GLOVE ECLIPSE 8.0 STRL XLNG CF (GLOVE) ×2 IMPLANT
GLOVE INDICATOR 8.0 STRL GRN (GLOVE) ×2 IMPLANT
GLOVE SURG SS PI 7.5 STRL IVOR (GLOVE) ×1 IMPLANT
GLOVE SURG SYN 6.5 ES PF (GLOVE) ×2 IMPLANT
GLOVE SURG SYN 6.5 PF PI (GLOVE) IMPLANT
GOWN STRL REUS W/ TWL LRG LVL3 (GOWN DISPOSABLE) IMPLANT
GOWN STRL REUS W/TWL LRG LVL3 (GOWN DISPOSABLE) ×2
GOWN STRL REUS W/TWL XL LVL3 (GOWN DISPOSABLE) ×3 IMPLANT
IV CATH PLACEMENT 20 GA (IV SOLUTION) ×1 IMPLANT
KIT TURNOVER CYSTO (KITS) ×2 IMPLANT
LEGGING LITHOTOMY PAIR STRL (DRAPES) IMPLANT
NEEDLE HYPO 22GX1.5 SAFETY (NEEDLE) ×2 IMPLANT
NS IRRIG 500ML POUR BTL (IV SOLUTION) ×2 IMPLANT
PACK BASIN DAY SURGERY FS (CUSTOM PROCEDURE TRAY) ×2 IMPLANT
PAD ABD 8X10 STRL (GAUZE/BANDAGES/DRESSINGS) ×2 IMPLANT
PAD PREP 24X48 CUFFED NSTRL (MISCELLANEOUS) IMPLANT
PENCIL BUTTON HOLSTER BLD 10FT (ELECTRODE) ×2 IMPLANT
SCRUB TECHNI CARE 4 OZ NO DYE (MISCELLANEOUS) ×2 IMPLANT
SHEARS HARMONIC 9CM CVD (BLADE) IMPLANT
SURGILUBE 2OZ TUBE FLIPTOP (MISCELLANEOUS) ×2 IMPLANT
SUT CHROMIC 2 0 SH (SUTURE) IMPLANT
SUT CHROMIC 3 0 SH 27 (SUTURE) ×2 IMPLANT
SUT VIC AB 2-0 SH 27 (SUTURE)
SUT VIC AB 2-0 SH 27XBRD (SUTURE) IMPLANT
SUT VIC AB 2-0 UR6 27 (SUTURE) IMPLANT
SUT VICRYL 0 UR6 27IN ABS (SUTURE) ×6 IMPLANT
SUT VICRYL AB 2 0 TIE (SUTURE) IMPLANT
SUT VICRYL AB 2 0 TIES (SUTURE)
SYR 20CC LL (SYRINGE) ×2 IMPLANT
SYR 27GX1/2 1ML LL SAFETY (SYRINGE) ×1 IMPLANT
SYR BULB IRRIGATION 50ML (SYRINGE) ×2 IMPLANT
SYR CONTROL 10ML LL (SYRINGE) IMPLANT
TAPE CLOTH 3X10 TAN LF (GAUZE/BANDAGES/DRESSINGS) ×2 IMPLANT
TOWEL OR 17X26 10 PK STRL BLUE (TOWEL DISPOSABLE) ×3 IMPLANT
TRAY DSU PREP LF (CUSTOM PROCEDURE TRAY) ×2 IMPLANT
TUBE CONNECTING 12X1/4 (SUCTIONS) ×2 IMPLANT
UNDERPAD 30X30 (UNDERPADS AND DIAPERS) ×2 IMPLANT
YANKAUER SUCT BULB TIP NO VENT (SUCTIONS) ×2 IMPLANT

## 2019-05-17 NOTE — Transfer of Care (Signed)
Immediate Anesthesia Transfer of Care Note  Patient: Victoria Delacruz  Procedure(s) Performed: HEMORRHOIDECTOMY, HEMORRHOID LIGATION/PEXY, ANORECTAL EXAM UNDER ANESTHESIA., REMOVAL ANAL CRYPT POLYP (N/A Rectum)  Patient Location: PACU  Anesthesia Type:General  Level of Consciousness: awake, alert , oriented and patient cooperative  Airway & Oxygen Therapy: Patient Spontanous Breathing and Patient connected to nasal cannula oxygen  Post-op Assessment: Report given to RN and Post -op Vital signs reviewed and stable  Post vital signs: Reviewed and stable  Last Vitals:  Vitals Value Taken Time  BP 128/84 05/17/19 0912  Temp    Pulse 90 05/17/19 0913  Resp    SpO2 95 % 05/17/19 0913  Vitals shown include unvalidated device data.  Last Pain:  Vitals:   05/17/19 0610  TempSrc:   PainSc: 0-No pain      Patients Stated Pain Goal: 6 (01/60/10 9323)  Complications: No apparent anesthesia complications

## 2019-05-17 NOTE — Anesthesia Postprocedure Evaluation (Signed)
Anesthesia Post Note  Patient: Victoria Delacruz  Procedure(s) Performed: HEMORRHOIDECTOMY, HEMORRHOID LIGATION/PEXY, ANORECTAL EXAM UNDER ANESTHESIA., REMOVAL ANAL CRYPT POLYP (N/A Rectum)     Patient location during evaluation: PACU Anesthesia Type: General Level of consciousness: awake and alert Pain management: pain level controlled Vital Signs Assessment: post-procedure vital signs reviewed and stable Respiratory status: spontaneous breathing, nonlabored ventilation, respiratory function stable and patient connected to nasal cannula oxygen Cardiovascular status: blood pressure returned to baseline and stable Postop Assessment: no apparent nausea or vomiting Anesthetic complications: no    Last Vitals:  Vitals:   05/17/19 0914 05/17/19 0930  BP:  127/84  Pulse:  86  Resp: 10 15  Temp: (!) 36.3 C   SpO2:  97%    Last Pain:  Vitals:   05/17/19 0930  TempSrc:   PainSc: Asleep                 Halea Lieb

## 2019-05-17 NOTE — Op Note (Signed)
05/17/2019  9:18 AM  PATIENT:  Victoria Delacruz  48 y.o. female  Patient Care Team: Harlan Stains, MD as PCP - General (Family Medicine) [name], MD as Consulting Physician (General Surgery) Richmond Campbell, MD as Consulting Physician (Gastroenterology) Crissie Reese, MD as Consulting Physician (Plastic Surgery) Michael Boston, MD as Consulting Physician (Colon and Rectal Surgery)  PRE-OPERATIVE DIAGNOSIS:  INTERNAL HEMORRHOIDS WITH BLEEDING, PROLAPSING ANAL CANAL POLYP, EXTERNAL HEMORRHOIDS.  POST-OPERATIVE DIAGNOSIS:   INTERNAL HEMORRHOIDS WITH BLEEDING - GRADE 4 PROLAPSING ANAL CANAL POLYPS EXTERNAL HEMORRHOIDS.  PROCEDURE:  Internal and external hemorrhoidectomy  x2  Excision of anal canal polyps x2 Internal hemorrhoidal ligation and pexy Anorectal examination under anesthesia  SURGEON:  Adin Hector, MD  ANESTHESIA:   General Anorectal & Local field block (0.25% bupivacaine with epinephrine mixed with Liposomal bupivacaine (Experel)   EBL:  Total I/O In: 1150 [I.V.:950; IV Piggyback:200] Out: 30 [Blood:30].  See operative record  Delay start of Pharmacological VTE agent (>24hrs) due to surgical blood loss or risk of bleeding:  NO  DRAINS: NONE  SPECIMEN:   Internal & external hemorrhoidx2  Anal canal polyps x2   DISPOSITION OF SPECIMEN:  PATHOLOGY  COUNTS:  YES  PLAN OF CARE: Discharge home after PACU  PATIENT DISPOSITION:  PACU - hemodynamically stable.  INDICATION: Pleasant patient with struggles with hemorrhoids.  Not able to be managed in the office despite an improved bowel regimen.  I recommended  examination under anesthesia and surgical treatment:  The anatomy & physiology of the anorectal region was discussed.  The pathophysiology of hemorrhoids and differential diagnosis was discussed.  Natural history risks without surgery was discussed.   I stressed the importance of a bowel regimen to have daily soft bowel movements to minimize  progression of disease.  Interventions such as sclerotherapy & banding were discussed.  The patient's symptoms are not adequately controlled by medicines and other non-operative treatments.  I feel the risks & problems of no surgery outweigh the operative risks; therefore, I recommended surgery to treat the hemorrhoids by ligation, pexy, and possible resection.  Risks such as bleeding, infection, need for further treatment, heart attack, death, and other risks were discussed.   I noted a good likelihood this will help address the problem.  Goals of post-operative recovery were discussed as well.  Possibility that this will not correct all symptoms was explained.  Post-operative pain, bleeding, constipation, urinary difficulties, and other problems after surgery were discussed.  We will work to minimize complications.   Educational handouts further explaining the pathology, treatment options, and bowel regimen were given as well.  Questions were answered.  The patient expresses understanding & wishes to proceed with surgery.  OR FINDINGS: Inflamed internal and external hemorrhoids.  Hemorrhoidal ligation & pexy done.  Excision of the largest left posterior grade 4 hemorrhoid.  Right posterior grade 4.  Excision of right lateral prolapsing anal canal polyp.  Excision of left anterior inflamed anal canal polyp.  DESCRIPTION:   Informed consent was confirmed. Patient underwent general anesthesia without difficulty. Patient was placed into prone positioning.  The perianal region was prepped and draped in sterile fashion. Surgical time-out confirmed our plan.  I did digital rectal examination and then transitioned over to anoscopy to get a sense of the anatomy.  Findings noted above.   I proceeded to do hemorrhoidal ligation and pexy.  I used a 2-0 Vicryl suture on a UR-6 needle in a figure-of-eight fashion 6 cm proximal to the anal verge.  I started at the largest hemorrhoid pile.  Because of redundant  hemorrhoidal tissue too bulky to merely ligate or pexy, I excised the excess internal hemorrhoid piles longitudinally in a fusiform biconcave fashion, at the  Left posterior and right posterior location, sparing the anal canal to avoid narrowing.  I then ran that stitch longitudinally more distally to close the hemorrhoidectomy wound to the anal verge over a Parks self retaining retractor & occasionally a large Hill-Furgeson retractor to avoid narrowing of the anal canal.  I then tied that stitch down to cause a hemorrhoidopexy.   I then did hemorrhoidal ligation and pexy at the other 4 columns.  At the completion of this, all 6 anorectal columns were ligated and pexied in the classic hexagonal fashion (right anterior/lateral/posterior, left anterior/lateral/posterior).  Patient also had a chronically prolapsing right lateral anal crypt polyp.  Smaller 1 left anterior.  These were excised at the base I closed the external part of the hemorrhoidectomy wounds with interrupted horizontal mattress 3-0 chromic suture, leaving the last 5 mm open to allow natural drainage.    I redid anoscopy & examination.  At completion of this, all hemorrhoids had been removed or reduced into the rectum.  There is no more prolapse.  Internal & external anatomy was more more normal.  Hemostasis was good.  Fluffed gauze was on-laid over the perianal region.  No packing done.  Patient is being extubated go to go to the recovery room.  I had discussed postop care in detail with the patient in the preop holding area.  Instructions for post-operative recovery and prescriptions are written. I discussed operative findings, updated the patient's status, discussed probable steps to recovery, and gave postoperative recommendations to the patient's spouse.  Recommendations were made.  Questions were answered.  He expressed understanding & appreciation.  Adin Hector, M.D., F.A.C.S. [facility] [address] [place], [place] [address] [phone] Main / Paging

## 2019-05-17 NOTE — Discharge Instructions (Signed)
ANORECTAL SURGERY:  POST OPERATIVE INSTRUCTIONS  ######################################################################  EAT Start with a pureed / full liquid diet After 24 hours, gradually transition to a high fiber diet.    CONTROL PAIN Control pain so you can tolerate bowel movements,  walk, sleep, tolerate sneezing/coughing, and go up/down stairs.   HAVE A BOWEL MOVEMENT DAILY Keep your bowels regular to avoid problems.   Taking a fiber supplement every day to keep bowels soft.   Try a laxative to override constipation. Use an antidairrheal to slow down diarrhea.   Call if not better after 2 tries  WALK Walk an hour a day.  Control your pain to do that.   CALL IF YOU HAVE PROBLEMS/CONCERNS Call if you are still struggling despite following these instructions. Call if you have concerns not answered by these instructions  ######################################################################    1. Take your usually prescribed home medications unless otherwise directed.  2. DIET: Follow a light bland diet & liquids the first 24 hours after arrival home, such as soup, liquids, starches, etc.  Be sure to drink plenty of fluids.  Quickly advance to a usual solid diet within a few days.  Avoid fast food or heavy meals as your are more likely to get nauseated or have irregular bowels.  A low-fat, high-fiber diet for the rest of your life is ideal.  3. PAIN CONTROL: a. Pain is best controlled by a usual combination of three different methods TOGETHER: i. Ice/Heat ii. Over the counter pain medication iii. Prescription pain medication b. Expect swelling and discomfort in the anus/rectal area.  Warm water baths (30-60 minutes up to 6 times a day, especially after bowel meovements) will help. Use ice for the first few days to help decrease swelling and bruising, then switch to heat such as warm towels, sitz baths, warm baths, etc to help relax tight/sore spots and speed recovery.   Some people prefer to use ice alone, heat alone, alternating between ice & heat.  Experiment to what works for you.   c. It is helpful to take an over-the-counter pain medication continuously for the first few weeks.  Choose one of the following that works best for you: i. Naproxen (Aleve, etc)  Two 235m tabs twice a day ii. Ibuprofen (Advil, etc) Three 2031mtabs four times a day (every meal & bedtime) iii. Acetaminophen (Tylenol, etc) 500-65083mour times a day (every meal & bedtime) d. A  prescription for pain medication (such as oxycodone, hydrocodone, etc) should be given to you upon discharge.  Take your pain medication as prescribed.  i. If you are having problems/concerns with the prescription medicine (does not control pain, nausea, vomiting, rash, itching, etc), please call us [phone] see if we need to switch you to a different pain medicine that will work better for you and/or control your side effect better. ii. If you need a refill on your pain medication, please contact your pharmacy.  They will contact our office to request authorization. Prescriptions will not be filled after 5 pm or on week-ends.  If can take up to 48 hours for it to be filled & ready so avoid waiting until you are down to thel ast pill. e. A topical cream (Dibucaine) or a prescription for a cream (such as diltiazem 2% gel) may be given to you.  Many people find relief with topical creams.  Some people find it burns too much.  Experiment.  If it helps, use it.  If it burns, don't using  it.  Use a Sitz Bath 4-8 times a day for relief   [employer] A sitz bath is a warm water bath taken in the sitting position that covers only the hips and buttocks. It may be used for either healing or hygiene purposes. Sitz baths are also used to relieve pain, itching, or muscle spasms. The water may contain medicine. Moist heat will help you heal and relax.  HOME CARE INSTRUCTIONS  Take 3 to 4 sitz baths a day. 1. Fill the  bathtub half full with warm water. 2. Sit in the water and open the drain a little. 3. Turn on the warm water to keep the tub half full. Keep the water running constantly. 4. Soak in the water for 15 to 20 minutes. 5. After the sitz bath, pat the affected area dry first.   4. KEEP YOUR BOWELS REGULAR a. The goal is one soft bowel movement a day b. Avoid getting constipated.  Between the surgery and the pain medications, it is common to experience some constipation.  Increasing fluid intake and taking a fiber supplement (such as Metamucil, Citrucel, FiberCon, MiraLax, etc) 2-3 times a day regularly will usually help prevent this problem from occurring.  A mild laxative (prune juice, Milk of Magnesia, MiraLax, etc) should be taken according to package directions if there are no bowel movements after 48 hours. c. Watch out for diarrhea.  If you have many loose bowel movements, simplify your diet to bland foods & liquids for a few days.  Stop any stool softeners and decrease your fiber supplement.  Switching to mild anti-diarrheal medications (Kayopectate, Pepto Bismol) can help.  Can try an imodium/loperamide dose.  If this worsens or does not improve, please call us.  5. Wound Care  a. Remove your bandages with your first bowel movement, usually the day after surgery.  You may have packing if you had an abscess.  Let any packing or gauze fall come out.   b. Wear an absorbent pad or soft cotton balls in your underwear as needed to catch any drainage and help keep the area  c. Keep the area clean and dry.  Bathe / shower every day.  Keep the area clean by showering / bathing over the incision / wound.   It is okay to soak an open wound to help wash it.  Consider using a squeeze bottle filled with warm water to gently wash the anal area.  Wet wipes or showers / gentle washing after bowel movements is often less traumatic than regular toilet paper. d. Dennis Bast will often notice bleeding with bowel movements.   This should slow down by the end of the first week of surgery.  Sitting on an ice pack can help. e. Expect some drainage.  This should slow down by the end of the first week of surgery, but you will have occasional bleeding or drainage up to a few months after surgery.  Wear an absorbent pad or soft cotton gauze in your underwear until the drainage stops.  6. ACTIVITIES as tolerated:   a. You may resume regular (light) daily activities beginning the next day--such as daily self-care, walking, climbing stairs--gradually increasing activities as tolerated.  If you can walk 30 minutes without difficulty, it is safe to try more intense activity such as jogging, treadmill, bicycling, low-impact aerobics, swimming, etc. b. Save the most intensive and strenuous activity for last such as sit-ups, heavy lifting, contact sports, etc  Refrain from any heavy lifting or straining  until you are off narcotics for pain control.   c. DO NOT PUSH THROUGH PAIN.  Let pain be your guide: If it hurts to do something, don't do it.  Pain is your body warning you to avoid that activity for another week until the pain goes down. d. You may drive when you are no longer taking prescription pain medication, you can comfortably sit for long periods of time, and you can safely maneuver your car and apply brakes. e. Dennis Bast may have sexual intercourse when it is comfortable.  7. FOLLOW UP in our office a. Please call [facility] at [phone] to set up an appointment to see your surgeon in the office for a follow-up appointment approximately 2-3 weeks after your surgery. b. Make sure that you call for this appointment the day you arrive home to ensure a convenient appointment time.  8. IF YOU HAVE DISABILITY OR FAMILY LEAVE FORMS, BRING THEM TO THE OFFICE FOR PROCESSING.  DO NOT GIVE THEM TO YOUR DOCTOR.        WHEN TO CALL us [phone]: 1. Poor pain control 2. Reactions / problems with new medications (rash/itching, nausea,  etc)  3. Fever over 101.5 F (38.5 C) 4. Inability to urinate 5. Nausea and/or vomiting 6. Worsening swelling or bruising 7. Continued bleeding from incision. 8. Increased pain, redness, or drainage from the incision  The clinic staff is available to answer your questions during regular business hours (8:30am-5pm).  Please dont hesitate to call and ask to speak to one of our nurses for clinical concerns.   A surgeon from [facility] is always on call at the hospitals   If you have a medical emergency, go to the nearest emergency room or call 911.    [facility], [place], [place], [place], [place]  [address] ? MAIN: [phone] ? TOLL FREE: [phone] ? FAX [phone] [email or web address]    Post Anesthesia Home Care Instructions  Activity: Get plenty of rest for the remainder of the day. A responsible individual must stay with you for 24 hours following the procedure.  For the next 24 hours, DO NOT: -Drive a car -Paediatric nurse -Drink alcoholic beverages -Take any medication unless instructed by your physician -Make any legal decisions or sign important papers.  Meals: Start with liquid foods such as gelatin or soup. Progress to regular foods as tolerated. Avoid greasy, spicy, heavy foods. If nausea and/or vomiting occur, drink only clear liquids until the nausea and/or vomiting subsides. Call your physician if vomiting continues.  Special Instructions/Symptoms: Your throat may feel dry or sore from the anesthesia or the breathing tube placed in your throat during surgery. If this causes discomfort, gargle with warm salt water. The discomfort should disappear within 24 hours.  If you had a scopolamine patch placed behind your ear for the management of post- operative nausea and/or vomiting:  1. The medication in the patch is effective for 72 hours, after which it should be removed.  Wrap patch in a tissue and  discard in the trash. Wash hands thoroughly with soap and water. 2. You may remove the patch earlier than 72 hours if you experience unpleasant side effects which may include dry mouth, dizziness or visual disturbances. 3. Avoid touching the patch. Wash your hands with soap and water after contact with the patch.    Information for Discharge Teaching: EXPAREL (bupivacaine liposome injectable suspension)   Your surgeon or anesthesiologist gave you EXPAREL(bupivacaine) to help  control your pain after surgery.   EXPAREL is a local anesthetic that provides pain relief by numbing the tissue around the surgical site.  EXPAREL is designed to release pain medication over time and can control pain for up to 72 hours.  Depending on how you respond to EXPAREL, you may require less pain medication during your recovery.  Possible side effects:  Temporary loss of sensation or ability to move in the area where bupivacaine was injected.  Nausea, vomiting, constipation  Rarely, numbness and tingling in your mouth or lips, lightheadedness, or anxiety may occur.  Call your doctor right away if you think you may be experiencing any of these sensations, or if you have other questions regarding possible side effects.  Follow all other discharge instructions given to you by your surgeon or nurse. Eat a healthy diet and drink plenty of water or other fluids.  If you return to the hospital for any reason within 96 hours following the administration of EXPAREL, it is important for health care providers to know that you have received this anesthetic. A teal colored band has been placed on your arm with the date, time and amount of EXPAREL you have received in order to alert and inform your health care providers. Please leave this armband in place for the full 96 hours following administration, and then you may remove the band.  HEMORRHOIDS  The rectum is the last foot of your colon, and it naturally stretches to  hold stool.  Hemorrhoidal piles are natural clusters of blood vessels that help the rectum and anal canal stretch to hold stool and allow bowel movements to eliminate feces.   Hemorrhoids are abnormally swollen blood vessels in the rectum.  Too much pressure in the rectum causes hemorrhoids by forcing blood to stretch and bulge the walls of the veins, sometimes even rupturing them.  Hemorrhoids can become like varicose veins you might see on a person's legs.  Most people will develop a flare of hemorrhoids in their lifetime.  When bulging hemorrhoidal veins are irritated, they can swell, burn, itch, cause pain, and bleed.  Most flares will calm down gradually own within a few weeks.  However, once hemorrhoids are created, they are difficult to get rid of completely and tend to flare more easily than the first flare.   Fortunately, good habits and simple medical treatment usually control hemorrhoids well, and surgery is needed only in severe cases. Types of Hemorrhoids:  Internal hemorrhoids usually don't initially hurt or itch; they are deep inside the rectum and usually have no sensation. If they begin to push out (prolapse), pain and burning can occur.  However, internal hemorrhoids can bleed.  Anal bleeding should not be ignored since bleeding could come from a dangerous source like colorectal cancer, so persistent rectal bleeding should be investigated by a doctor, sometimes with a colonoscopy.  External hemorrhoids cause most of the symptoms - pain, burning, and itching. Nonirritated hemorrhoids can look like small skin tags coming out of the anus.   Thrombosed hemorrhoids can form when a hemorrhoid blood vessel bursts and causes the hemorrhoid to suddenly swell.  A purple blood clot can form in it and become an excruciatingly painful lump at the anus. Because of these unpleasant symptoms, immediate incision and drainage by a surgeon at an office visit can provide much relief of the pain.     PREVENTION Avoiding the most frequent causes listed below will prevent most cases of hemorrhoids: Constipation Hard stools Diarrhea  Constant sitting  Straining with bowel movements Sitting on the toilet for a long time  Severe coughing  episodes Pregnancy / Childbirth  Heavy Lifting  Sometimes avoiding the above triggers is difficult:  How can you avoid sitting all day if you have a seated job? Also, we try to avoid coughing and diarrhea, but sometimes its beyond your control.  Still, there are some practical hints to help: Keep the anal and genital area clean.  Moistened tissues such as flushable wet wipes are less irritating than toilet paper.  Using irrigating showers or bottle irrigation washing gently cleans this sensitive area.   Avoid dry toilet paper when cleaning after bowel movements.  Marland Kitchen Keep the anal and genital area dry.  Lightly pat the rectal area dry.  Avoid rubbing.  Talcum or baby powders can help GET YOUR STOOLS SOFT.   This is the most important way to prevent irritated hemorrhoids.  Hard stools are like sandpaper to the anorectal canal and will cause more problems.  The goal: ONE SOFT BOWEL MOVEMENT A DAY!  BMs from every other day to 3 times a day is a tolerable range Treat coughing, diarrhea and constipation early since irritated hemorrhoids may soon follow.  If your main job activity is seated, always stand or walk during your breaks. Make it a point to stand and walk at least 5 minutes every hour and try to shift frequently in your chair to avoid direct rectal pressure.  Always exhale as you strain or lift. Don't hold your breath.  Do not delay or try to prevent a bowel movement when the urge is present. Exercise regularly (walking or jogging 60 minutes a day) to stimulate the bowels to move. No reading or other activity while on the toilet. If bowel movements take longer than 5 minutes, you are too constipated. AVOID CONSTIPATION Drink plenty of liquids (1 1/2 to  2 quarts of water and other fluids a day unless fluid restricted for another medical condition). Liquids that contain caffeine (coffee a, tea, soft drinks) can be dehydrating and should be avoided until constipation is controlled. Consider minimizing milk, as dairy products may be constipating. Eat plenty of fiber (30g a day ideal, more if needed).  Fiber is the undigested part of plant food that passes into the colon, acting as natures broom to encourage bowel motility and movement.  Fiber can absorb and hold large amounts of water. This results in a larger, bulkier stool, which is soft and easier to pass.  Eating foods high in fiber - 12 servings - such as  Vegetables: Root (potatoes, carrots, turnips), Leafy green (lettuce, salad greens, celery, spinach), High residue (cabbage, broccoli, etc.) Fruit: Fresh, Dried (prunes, apricots, cherries), Stewed (applesauce)  Whole grain breads, pasta, whole wheat Bran cereals, muffins, etc. Consider adding supplemental bulking fiber which retains large volumes of water: Psyllium ground seeds (native plant from [place])--available as Metamucil, Konsyl, Effersyllium, Per Diem Fiber, or the less expensive generic forms.  Citrucel  (methylcellulose wood fiber) . FiberCon (Polycarbophil) Polyethylene Glycol - and artificial fiber commonly called Miralax or Glycolax.  It is helpful for people with gassy or bloated feelings with regular fiber Flax Seed - a less gassy natural fiber  Laxatives can be useful for a short period if constipation is severe Osmotics (Milk of Magnesia, Fleets Phospho-Soda, Magnesium Citrate)  Stimulants (Senokot,   Castor Oil,  Dulcolax, Ex-Lax)    Laxatives are not a good long-term solution as it can stress the bowels and  cause too much mineral loss and dehydration.   Avoid taking laxatives for more than 7 days in a row.  AVOID DIARRHEA Switch to liquids and simpler foods for a few days to avoid stressing your intestines  further. Avoid dairy products (especially milk & ice cream) for a short time.  The intestines often can lose the ability to digest lactose when stressed. Avoid foods that cause gassiness or bloating.  Typical foods include beans and other legumes, cabbage, broccoli, and dairy foods.  Every person has some sensitivity to other foods, so listen to your body and avoid those foods that trigger problems for you. Adding fiber (Citrucel, Metamucil, FiberCon, Flax seed, Miralax) gradually can help thicken stools by absorbing excess fluid and retrain the intestines to act more normally.  Slowly increase the dose over a few weeks.  Too much fiber too soon can backfire and cause cramping & bloating. Probiotics (such as active yogurt, Align, etc) may help repopulate the intestines and colon with normal bacteria and calm down a sensitive digestive tract.  Most studies show it to be of mild help, though, and such products can be costly. Medicines: Bismuth subsalicylate (ex. Kayopectate, Pepto Bismol) every 30 minutes for up to 6 doses can help control diarrhea.  Avoid if pregnant. Loperamide (Immodium) can slow down diarrhea.  Start with two tablets (4mg  total) first and then try one tablet every 6 hours.  Avoid if you are having fevers or severe pain.  If you are not better or start feeling worse, stop all medicines and call your doctor for advice Call your doctor if you are getting worse or not better.  Sometimes further testing (cultures, endoscopy, X-ray studies, bloodwork, etc) may be needed to help diagnose and treat the cause of the diarrhea.  TROUBLESHOOTING IRREGULAR BOWELS 1) Avoid extremes of bowel movements (no bad constipation/diarrhea) 2) Miralax 17gm mixed in 8oz. water or juice-daily. May use BID as needed.  3) Gas-x,Phazyme, etc. as needed for gas & bloating.  4) Soft,bland diet. No spicy,greasy,fried foods.  5) Prilosec over-the-counter as needed  6) May hold gluten/wheat products from diet to  see if symptoms improve.  7)  May try probiotics (Align, Activa, etc) to help calm the bowels down 7) If symptoms become worse call back immediately.   TREATMENT OF HEMORRHOID FLARE If these preventive measures fail, you must take action right away! Hemorrhoids are one condition that can be mild in the morning and become intolerable by nightfall. Most hemorrhoidal flares take several weeks to calm down.  These suggestions can help: Warm soaks.  This helps more than any topical medication.  Use up to 8 times a day.  Usually sitz baths or sitting in a warm bathtub helps.  Sitting on moist warm towels are helpful.  Switching to ice packs/cool compresses can be helpful  Use a Sitz Bath 4-8 times a day for relief A sitz bath is a warm water bath taken in the sitting position that covers only the hips and buttocks. It may be used for either healing or hygiene purposes. Sitz baths are also used to relieve pain, itching, or muscle spasms. The water may contain medicine. Moist heat will help you heal and relax.  HOME CARE INSTRUCTIONS  Take 3 to 4 sitz baths a day. 1. Fill the bathtub half full with warm water. 2. Sit in the water and open the drain a little. 3. Turn on the warm water to keep the tub half full. Keep the water running constantly.  4. Soak in the water for 15 to 20 minutes. 5. After the sitz bath, pat the affected area dry first. SEEK MEDICAL CARE IF:  You get worse instead of better. Stop the sitz baths if you get worse.  Normalize your bowels.  Extremes of diarrhea or constipation will make hemorrhoids worse.  One soft bowel movement a day is the goal.  Fiber can help get your bowels regular Wet wipes instead of toilet paper Pain control with a NSAID such as ibuprofen (Advil) or naproxen (Aleve) or acetaminophen (Tylenol) around the clock.  Narcotics are constipating and should be minimized if possible Topical creams contain steroids (bydrocortisone) or local anesthetic (xylocaine)  can help make pain and itching more tolerable.   EVALUATION If hemorrhoids are still causing problems, you could benefit by an evaluation by a surgeon.  The surgeon will obtain a history and examine you.  If hemorrhoids are diagnosed, some therapies can be offered in the office, usually with an anoscope into the less sensitive area of the rectum: -injection of hemorrhoids (sclerotherapy) can scar the blood vessels of the swollen/enlarged hemorrhoids to help shrink them down to a more normal size -rubber banding of the enlarged hemorrhoids to help shrink them down to a more normal size -drainage of the blood clot causing a thrombosed hemorrhoid,  to relieve the severe pain   While 90% of the time such problems from hemorrhoids can be managed without preceding to surgery, sometimes the hemorrhoids require a operation to control the problem (uncontrolled bleeding, prolapse, pain, etc.).   This involves being placed under general anesthesia where the surgeon can confirm the diagnosis and remove, suture, or staple the hemorrhoid(s).  Your surgeon can help you treat the problem appropriately.

## 2019-05-17 NOTE — Anesthesia Procedure Notes (Signed)
Procedure Name: Intubation Date/Time: 05/17/2019 7:59 AM Performed by: Wanita Chamberlain, CRNA Pre-anesthesia Checklist: Patient identified, Emergency Drugs available, Suction available and Patient being monitored Patient Re-evaluated:Patient Re-evaluated prior to induction Oxygen Delivery Method: Circle system utilized Preoxygenation: Pre-oxygenation with 100% oxygen Induction Type: IV induction Ventilation: Mask ventilation without difficulty Grade View: Grade II Tube type: Oral Tube size: 7.0 mm Number of attempts: 1 Airway Equipment and Method: Stylet Placement Confirmation: breath sounds checked- equal and bilateral,  CO2 detector,  positive ETCO2 and ETT inserted through vocal cords under direct vision Secured at: 22 cm Dental Injury: Teeth and Oropharynx as per pre-operative assessment

## 2019-05-17 NOTE — H&P (Signed)
Victoria Delacruz DOB: 1971/01/12   Patient Care Team: Harlan Stains, MD as PCP - General (Family Medicine) [name], MD as Consulting Physician (General Surgery) Richmond Campbell, MD as Consulting Physician (Gastroenterology) Crissie Reese, MD as Consulting Physician (Plastic Surgery) Michael Boston, MD as Consulting Physician (Colon and Rectal Surgery)  ` ` Patient sent for surgical consultation at the request of Dr Harlan Stains  Chief Complaint: Hemorrhoids ` ` The patient is a pleasant woman. Breast cancer survivor and required mastectomies with breast reconstruction. Had recurrence requiring reexcision in 2011. No recurrence since. Recalls having a colonoscopy a few years ago that proved some hemorrhoids that were banded by Dr. Earlean Shawl. No other major GI issues. On further history of breast cancer, she worries about colorectal cancer. She is helpful just hemorrhoids but is concerned. Discussed with primary care physician. Surgical consultation offered.  However she's had intermittent rectal bleeding for the past few months. It concerns her. She thinks they're hemorrhoids. She claims that she tends to get constipated. She is tried to adjust a high fiber diet. With that, she moves her bowels at least every other day. No rectal pain or itching or drainage. Just blood when she wipes. Occasionally passes clots. It concerns her. She is required surgery for an ectopic proximally with a right salpingectomy. She got bad memorrhagia after post-adjuvant chemotherapy and required a palliative hysterectomy. Not for cancer. No other surgeries for over a decade.  No personal nor family history of GI/colon cancer, inflammatory bowel disease, irritable bowel syndrome, allergy such as Celiac Sprue, dietary/dairy problems, colitis, ulcers nor gastritis. No recent sick contacts/gastroenteritis. No travel outside the country. No changes in diet. No dysphagia to solids or  liquids. No significant heartburn or reflux. No hematochezia, hematemesis, coffee ground emesis. No evidence of prior gastric/peptic ulceration.  (Review of systems as stated in this history (HPI) or in the review of systems. Otherwise all other 12 point ROS are negative) ` ` `   Past Surgical History Nance Pew, CMA; 02/14/2019 1:54 PM) Breast Augmentation Bilateral, Left. Breast Biopsy Left. multiple Breast Reconstruction Left. Hysterectomy (not due to cancer) - Partial Mammoplasty; Reduction Right. Mastectomy Left. Oral Surgery  Diagnostic Studies History Nance Pew, CMA; 02/14/2019 1:54 PM) Colonoscopy 1-5 years ago never Mammogram within last year Pap Smear 1-5 years ago  Allergies Nance Pew, CMA; 02/14/2019 1:54 PM) No Known Allergies [02/14/2019]: No Known Drug Allergies [10/28/2014]: Allergies Reconciled  Medication History Nance Pew, CMA; 02/14/2019 1:55 PM) Vitamin D (Cholecalciferol) (1000UNIT Tablet, Oral daily) Active. AmLODIPine Besylate (5MG  Tablet, Oral) Active. Citalopram Hydrobromide (20MG  Tablet, Oral) Active. Potassium Chloride ER (10MEQ Tablet ER, Oral) Active. Propranolol HCl (40MG  Tablet, Oral) Active. Flexeril (10MG  Tablet, Oral) Active. Cymbalta (20MG  Capsule DR Part, Oral) Active. Propranolol HCl (20MG  Tablet, Oral) Active. Medications Reconciled  Social History Nance Pew, CMA; 02/14/2019 1:54 PM) Alcohol use Occasional alcohol use. Caffeine use Carbonated beverages, Tea. No drug use Tobacco use Never smoker.  Family History Nance Pew, [place]; 02/14/2019 1:54 PM) Arthritis Mother, Sister. Cancer Sister. Diabetes Mellitus Mother. Hypertension Brother, Father, Mother, Sister. Respiratory Condition Father.  Pregnancy / Birth History Nance Pew, [place]; 02/14/2019 1:54 PM) Age at menarche 81 years. Gravida 2 Irregular periods Maternal age 57-25 Para 0  Other Problems  Nance Pew, CMA; 02/14/2019 1:54 PM) Anxiety Disorder Breast Cancer Gastroesophageal Reflux Disease Hemorrhoids High blood pressure Other disease, cancer, significant illness Transfusion history     Review of Systems ([place]; 02/14/2019 1:54 PM) General Present- Weight Gain. Not  Present- Appetite Loss, Chills, Fatigue, Fever, Night Sweats and Weight Loss. Respiratory Not Present- Bloody sputum, Chronic Cough, Difficulty Breathing, Snoring and Wheezing. Breast Not Present- Breast Mass, Breast Pain, Nipple Discharge and Skin Changes. Gastrointestinal Present- Abdominal Pain, Bloating, Bloody Stool, Constipation, Excessive gas and Hemorrhoids. Not Present- Change in Bowel Habits, Chronic diarrhea, Difficulty Swallowing, Gets full quickly at meals, Indigestion, Nausea, Rectal Pain and Vomiting. Female Genitourinary Present- Nocturia. Not Present- Frequency, Painful Urination, Pelvic Pain and Urgency. Musculoskeletal Present- Back Pain, Joint Stiffness, Muscle Pain and Swelling of Extremities. Not Present- Joint Pain and Muscle Weakness. Endocrine Present- Hot flashes. Not Present- Cold Intolerance, Excessive Hunger, Hair Changes, Heat Intolerance and New Diabetes.  Vitals (Sabrina Canty CMA; 02/14/2019 1:55 PM) 02/14/2019 1:55 PM Weight: 177 lb Height: 63in Body Surface Area: 1.84 m Body Mass Index: 31.35 kg/m  Temp.: 98.55F(Oral)  Pulse: 98 (Regular)  BP: 144/84 (Sitting, Left Arm, Standard)  BP 125/85    Pulse 63    Temp 98.2 F (36.8 C) (Oral)    Resp 16    Ht 5\' 3"  (1.6 m)    Wt 79 kg    LMP 04/23/2011    SpO2 99%    BMI 30.84 kg/m      Physical Exam Adin Hector MD; 02/14/2019 2:37 PM)  General Mental Status-Alert. General Appearance-Not in acute distress, Not Sickly. Orientation-Oriented X3. Hydration-Well hydrated. Voice-Normal.  Integumentary Global Assessment Upon inspection and palpation of skin surfaces of  the - Axillae: non-tender, no inflammation or ulceration, no drainage. and Distribution of scalp and body hair is normal. General Characteristics Temperature - normal warmth is noted.  Head and Neck Head-normocephalic, atraumatic with no lesions or palpable masses. Face Global Assessment - atraumatic, no absence of expression. Neck Global Assessment - no abnormal movements, no bruit auscultated on the right, no bruit auscultated on the left, no decreased range of motion, non-tender. Trachea-midline. Thyroid Gland Characteristics - non-tender.  Eye Eyeball - Left-Extraocular movements intact, No Nystagmus. Eyeball - Right-Extraocular movements intact, No Nystagmus. Cornea - Left-No Hazy. Cornea - Right-No Hazy. Sclera/Conjunctiva - Left-No scleral icterus, No Discharge. Sclera/Conjunctiva - Right-No scleral icterus, No Discharge. Pupil - Left-Direct reaction to light normal. Pupil - Right-Direct reaction to light normal.  ENMT Ears Pinna - Left - no drainage observed, no generalized tenderness observed. Right - no drainage observed, no generalized tenderness observed. Nose and Sinuses External Inspection of the Nose - no destructive lesion observed. Inspection of the nares - Left - quiet respiration. Right - quiet respiration. Mouth and Throat Lips - Upper Lip - no fissures observed, no pallor noted. Lower Lip - no fissures observed, no pallor noted. Nasopharynx - no discharge present. Oral Cavity/Oropharynx - Tongue - no dryness observed. Oral Mucosa - no cyanosis observed. Hypopharynx - no evidence of airway distress observed.  Chest and Lung Exam Inspection Movements - Normal and Symmetrical. Accessory muscles - No use of accessory muscles in breathing. Palpation Palpation of the chest reveals - Non-tender. Auscultation Breath sounds - Normal and Clear.  Cardiovascular Auscultation Rhythm - Regular. Murmurs & Other Heart Sounds - Auscultation of  the heart reveals - No Murmurs and No Systolic Clicks.  Abdomen Inspection Inspection of the abdomen reveals - No Visible peristalsis and No Abnormal pulsations. Umbilicus - No Bleeding, No Urine drainage. Palpation/Percussion Palpation and Percussion of the abdomen reveal - Soft, Non Tender, No Rebound tenderness, No Rigidity (guarding) and No Cutaneous hyperesthesia. Note: Abdomen soft. Not severely distended. No distasis recti. No umbilical or  other anterior abdominal wall hernias  Female Genitourinary Sexual Maturity Tanner 5 - Adult hair pattern. Note: No vaginal bleeding nor discharge  Rectal Note: External hemorrhoids with tags 3. Irritated internal hemorrhoids friable bleeding. Right anterior more than right posterior suspicious than left lateral. Enlarged anal crypt polyp on right posterior aspect that prolapses. No definite rectal mass is felt to 8 cm by digital exam  Peripheral Vascular Upper Extremity Inspection - Left - No Cyanotic nailbeds, Not Ischemic. Right - No Cyanotic nailbeds, Not Ischemic.  Neurologic Neurologic evaluation reveals -normal attention span and ability to concentrate, able to name objects and repeat phrases. Appropriate fund of knowledge , normal sensation and normal coordination. Mental Status Affect - not angry, not paranoid. Cranial Nerves-Normal Bilaterally. Gait-Normal.  Neuropsychiatric Mental status exam performed with findings of-able to articulate well with normal speech/language, rate, volume and coherence, thought content normal with ability to perform basic computations and apply abstract reasoning and no evidence of hallucinations, delusions, obsessions or homicidal/suicidal ideation.  Musculoskeletal Global Assessment Spine, Ribs and Pelvis - no instability, subluxation or laxity. Right Upper Extremity - no instability, subluxation or laxity.  Lymphatic Head & Neck  General Head & Neck Lymphatics:  Bilateral - Description - No Localized lymphadenopathy. Axillary  General Axillary Region: Bilateral - Description - No Localized lymphadenopathy. Femoral & Inguinal  Generalized Femoral & Inguinal Lymphatics: Left - Description - No Localized lymphadenopathy. Right - Description - No Localized lymphadenopathy.   Results Adin Hector MD; 02/14/2019 2:49 PM) Procedures  Name Value Date Hemorrhoids Procedure Anal exam: External Hemorrhoid Internal exam: Internal Hemorrhoids (bleeding) Internal Hemorroids ( non-bleeding) Mass prolapse Other: External hemorrhoids with tags 3. Epithelialized and not ulcerated nor bleeding. Irritated internal hemorrhoids friable. Some scant bright red blood and clots and anal canal suspicious for hemorrhoidal source. Right anterior more than right posterior suspicious than left lateral. Enlarged anal crypt polyp on right posterior aspect that prolapses. No other definite rectal mass is felt to 8 cm by digital exam ......  ...... Perianal skin clean with good hygiene. No pruritis ani. No pilonidal disease. No fissure. No abscess/fistula. Normal sphincter tone. No condyloma warts. Tolerates digital and anoscopic rectal exam. No other rectal masses.  ...... Exam done with assistance of female Medical Assistant in the room.  Performed: 02/14/2019 2:18 PM    Assessment & Plan (   PAINLESS RECTAL BLEEDING (K62.5) Impression: Intermittent rectal bleeding with some clots. Probably hemorrhoidal in nature. However, with her history of cancer I think it makes sense to consider colonoscopy to make sure not missing anything. She recalls having an underwhelming colonoscopy years ago. She initially thought it was a couple years out, now she thinks it's more than 3. Dr. Earlean Shawl. Like to get records. Consider repeat colonoscopy. That may be delayed to the Covid epidemic. It may be that we do with hemorrhoid surgery first and then  colonoscopy thereafter.  Current Plans Instructed to schedule colonoscopywith a gastroenterologist  PROLAPSED INTERNAL HEMORRHOIDS, GRADE 2 (K64.1) Impression: I suspect the bleeding is probably from her hemorrhoids. It is hard to tell on examination. The 2 right-sided are enlarged. I think she would benefit from hemorrhoidal ligation faxing probable hemorrhoidectomy. The rather contiguous with her external hemorrhoids, some most likely they will have to be removed as well. Outpatient surgery. I did caution her that she will have snipped and postoperative pain first week or so especially, and then gradually resolving. Intermittent bleeding and drainage and leaking is, in the first month or so. Eventually  does resolve. She is leaning toward surgery  The anatomy & physiology of the anorectal region was discussed. The pathophysiology of hemorrhoids and differential diagnosis was discussed. Natural history progression was discussed. I stressed the importance of a bowel regimen to have daily soft bowel movements to minimize progression of disease. Goal of one BM / day ideal. Use of wet wipes, warm baths, avoiding straining, etc were emphasized.  Educational handouts further explaining the pathology, treatment options, and bowel regimen were given as well. The patient expressed understanding.  Current Plans ANOSCOPY, DIAGNOSTIC (09811) Pt Education - [facility] Hemorrhoids (Jonna Dittrich): discussed with patient and provided information. Pt Education - Pamphlet Given - The Hemorrhoid Book: discussed with patient and provided information. The anatomy & physiology of the anorectal region was discussed. The pathophysiology of hemorrhoids and differential diagnosis was discussed. Natural history risks without surgery was discussed. I stressed the importance of a bowel regimen to have daily soft bowel movements to minimize progression of disease. Interventions such as sclerotherapy & banding were discussed.  The  patient's symptoms are not adequately controlled by medicines and other non-operative treatments. I feel the risks & problems of no surgery outweigh the operative risks; therefore, I recommended surgery to treat the hemorrhoids by ligation, pexy, and possible resection.  Risks such as bleeding, infection, urinary difficulties, need for further treatment, heart attack, death, and other risks were discussed. I noted a good likelihood this will help address the problem. Goals of post-operative recovery were discussed as well. Possibility that this will not correct all symptoms was explained. Post-operative pain, bleeding, constipation, and other problems after surgery were discussed. We will work to minimize complications. Educational handouts further explaining the pathology, treatment options, and bowel regimen were given as well. Questions were answered. The patient expresses understanding & wishes to proceed with surgery.   POLYP OF ANAL VERGE (K62.0) Impression: Smooth pedunculate mass on right posterior anal canal. Most likely a hypertrophied anal crypt polyp that intermittently prolapses. Would remove this at the time of surgery as well.   EXTERNAL HEMORRHOIDS WITH COMPLICATION (B14.7) Impression: Medium-sized external hemorrhoids 3. Most likely will have to be removed at the time of surgery. We'll try and hold off on being too elaborate since she does not have major pruritus or itching from this. However, she's going to have pain already, I recommended considering definitive surgery to normalize her anatomy as much as possible as long as we can be safe     Adin Hector, MD, FACS, MASCRS Gastrointestinal and Minimally Invasive Surgery    [address], [place], [place] [address] [phone] Main / Paging [phone] Fax

## 2019-05-18 ENCOUNTER — Encounter (HOSPITAL_BASED_OUTPATIENT_CLINIC_OR_DEPARTMENT_OTHER): Payer: Self-pay | Admitting: Surgery

## 2019-06-08 DIAGNOSIS — M797 Fibromyalgia: Secondary | ICD-10-CM | POA: Diagnosis not present

## 2019-06-08 DIAGNOSIS — F325 Major depressive disorder, single episode, in full remission: Secondary | ICD-10-CM | POA: Diagnosis not present

## 2019-06-08 DIAGNOSIS — I10 Essential (primary) hypertension: Secondary | ICD-10-CM | POA: Diagnosis not present

## 2019-06-08 DIAGNOSIS — F419 Anxiety disorder, unspecified: Secondary | ICD-10-CM | POA: Diagnosis not present

## 2019-08-13 DIAGNOSIS — I10 Essential (primary) hypertension: Secondary | ICD-10-CM | POA: Diagnosis not present

## 2019-08-13 DIAGNOSIS — G501 Atypical facial pain: Secondary | ICD-10-CM | POA: Diagnosis not present

## 2019-08-13 DIAGNOSIS — F419 Anxiety disorder, unspecified: Secondary | ICD-10-CM | POA: Diagnosis not present

## 2019-08-28 DIAGNOSIS — R42 Dizziness and giddiness: Secondary | ICD-10-CM | POA: Diagnosis not present

## 2019-08-28 DIAGNOSIS — R519 Headache, unspecified: Secondary | ICD-10-CM | POA: Diagnosis not present

## 2019-08-28 DIAGNOSIS — F439 Reaction to severe stress, unspecified: Secondary | ICD-10-CM | POA: Diagnosis not present

## 2019-08-28 DIAGNOSIS — J301 Allergic rhinitis due to pollen: Secondary | ICD-10-CM | POA: Diagnosis not present

## 2019-09-14 DIAGNOSIS — M25561 Pain in right knee: Secondary | ICD-10-CM | POA: Diagnosis not present

## 2019-09-26 ENCOUNTER — Ambulatory Visit: Payer: Medicare HMO

## 2019-10-08 ENCOUNTER — Telehealth: Payer: Self-pay | Admitting: Hematology

## 2019-10-08 NOTE — Telephone Encounter (Signed)
Called patient per providers request to convert 12/11 appointment to a virtual visit, patient is notified. Labs cancelled.

## 2019-10-11 NOTE — Progress Notes (Signed)
[facility]   Telephone:(336) [phone] Fax:(336) [phone]   Clinic Follow up Note   Patient Care Team: Harlan Stains, MD as PCP - General (Family Medicine) [name], MD as Consulting Physician (General Surgery) Richmond Campbell, MD as Consulting Physician (Gastroenterology) Crissie Reese, MD as Consulting Physician (Plastic Surgery) Michael Boston, MD as Consulting Physician (Colon and Rectal Surgery)   I connected with Victoria Delacruz on 10/12/2019 at  1:20 PM EST by video enabled telemedicine visit and verified that I am speaking with the correct person using two identifiers.  I discussed the limitations, risks, security and privacy concerns of performing an evaluation and management service by telephone and the availability of in person appointments. I also discussed with the patient that there may be a patient responsible charge related to this service. The patient expressed understanding and agreed to proceed.   Patient's location:  Her home  Provider's location:  My Office   CHIEF COMPLAINT:  F/u of left breast cancer   SUMMARY OF ONCOLOGIC HISTORY: Oncology History  Malignant neoplasm of female breast ([place])  08/02/2003 Initial Diagnosis   MALIGNANT NEOPLASM OF BREAST UNSPECIFIED SITE. High-grade DCIS of Left breast; Tumor was 4.0 cm. Sentinel lymph nodes were negative.  ER, 24%; PR 2%.  Patient had saline implant.    08/02/2003 Surgery   L mastectomy with reconstruction [name]   05/07/2010 Surgery   R port-a-cath placement   05/11/2010 Relapse/Recurrence   Invasive ductal carcinoma of L breast; ER: 99%, positive; PR: 53%, positive; Ki 67 (mib-1): 20%; hER@ neu by FISH without ampliication; ratio of her 2 CEP 17 was 1.17.    05/28/2010 Surgery   Local excision of the L invasive ductal carcinoma by [name].   06/08/2010 - 08/10/2010 Chemotherapy   Completed 4 cycles of adjuvant chemotherapy with cytoxan and taxotere in combination with neulasta     09/08/2010 - 11/04/2010 Radiation Therapy   XRT was given to the left breast and chall wall consisting of 4780 in 26 fractions with an 1800 cGy boost in 9 fractions Dr Valere Dross.   11/04/2010 - 04/17/2011 Chemotherapy   Tamoxifen taken sporadically and discontinued due to vaginal bleeding.    08/30/2011 Surgery   Laparoscopic assisted vaginal hysterectomy.   04/10/2012 Imaging   MRI of the brain normal.  No cause of the patient's headache identified.    12/17/2012 Imaging   CT scan of abdomen and pelvis showed no acute findings in the abdomen or pelvis.  There were felt to be hepatic hemangiomas present, mesuring 2.5 cm in the superior right heaptic lobe at the hepatic dome and a 7 mm lesion located anteriorly.    08/23/2013 Imaging   Digital diagnostic unilateral right mammogram showed no mamographic or sonographic evidence of malignancy, right breast.      09/11/2013 -  Anti-estrogen oral therapy   Tamoxifen 20 mg once daily, planning for 10 years.   11/26/2016 Imaging   MR Cervical Spine W WO Contrast IMPRESSION: Small left sided disc protrusion at C5-6 extending into the foramen. This could be a source of radiculopathy involving the left C6 nerve root. Diffusely abnormal bone marrow likely related to prior chemotherapy or anemia.   03/17/2017 Imaging   US Abdomen IMPRESSION: No acute or focal abnormality identified.    03/17/2017 Imaging   US Abdomen Complete  IMPRESSION: No acute or focal abnormality identified.   03/23/2018 Imaging   03/23/2018 Mammogram IMPRESSION: No evidence of malignancy.   09/12/2018 Surgery   She  underwent breast reconstruction on 09/12/2018 with Dr Harlow Mares      CURRENT THERAPY:  Tamoxifen started in 11/2009, held 04/2011-08/2013, and restarted on 09/11/2013   INTERVAL HISTORY:  Victoria Delacruz is here for a follow up of left breast cancer. They identified themselves by face to face video. She was last seen by me 6 months ago. She notes she is  doing well. She notes she has been seeing her PCP. She notes she has been spitting up phlegm, especially in the AM, she has been having dizziness and HA (can get up to 11/10). Her AH have been ongoing for 6 months and has gotten worse. She feels her vision is blurred but her ophthalmologist notes her prescription has not changed. She feels hot at times or cold at times. She feels her mood is unstable. She feels this is not related to stress alone.    REVIEW OF SYSTEMS:   Constitutional: Denies fevers, chills or abnormal weight loss (+) HA and dizziness (+) Hot/cold Eyes: (+) Blurred vision Ears, nose, mouth, throat, and face: Denies mucositis or sore throat Respiratory: Denies cough, dyspnea or wheezes  (+) AM phlegm  Cardiovascular: Denies palpitation, chest discomfort or lower extremity swelling Gastrointestinal:  Denies nausea, heartburn or change in bowel habits Skin: Denies abnormal skin rashes MSK: (+) Fibromyalgia  Lymphatics: Denies new lymphadenopathy or easy bruising Neurological:Denies numbness, tingling or new weaknesses Behavioral/Psych: (+) Mood swings/unstable  All other systems were reviewed with the patient and are negative.  MEDICAL HISTORY:  Past Medical History:  Diagnosis Date   Anemia    Arthritis    Blood transfusion 05/17/11, 05/25/11   anemia   Breast cancer ([place]) 2004 and 2011   Depression    Dizziness    ECTOPIC PREGNANCY 1997 and 2011   x 2   Family history of breast cancer    grandmother   Fibroid    Fibromyalgia    Hemorrhoids 2020   Hypertension    Personal history of radiation therapy 2011    SURGICAL HISTORY: Past Surgical History:  Procedure Laterality Date   ABDOMINAL HYSTERECTOMY     AUGMENTATION MAMMAPLASTY Left    BREAST BIOPSY Left 07/05/2003   malignant   BREAST BIOPSY Left 2011   malignant   EVALUATION UNDER ANESTHESIA WITH HEMORRHOIDECTOMY N/A 05/17/2019   Procedure: HEMORRHOIDECTOMY, HEMORRHOID LIGATION/PEXY,  ANORECTAL EXAM UNDER ANESTHESIA., REMOVAL ANAL CRYPT POLYP;  Surgeon: Michael Boston, MD;  Location: [facility];  Service: General;  Laterality: N/A;   LAPAROSCOPIC ASSISTED VAGINAL HYSTERECTOMY  08/30/2011   Procedure: LAPAROSCOPIC ASSISTED VAGINAL HYSTERECTOMY;  Surgeon: Osborne Oman, MD;  Location: [facility];  Service: Gynecology;  Laterality: N/A;   LAPAROSCOPY FOR ECTOPIC PREGNANCY     LAPAROSCOPY W/ MINI-LAPAROTOMY     MASTECTOMY Left 2004   complete with reconstruction x 3, no b/p punctures to left arm   RECONSTRUCTION BREAST IMMEDIATE / DELAYED W/ TISSUE EXPANDER  2004   REDUCTION MAMMAPLASTY Right     I have reviewed the social history and family history with the patient and they are unchanged from previous note.  ALLERGIES:  has No Known Allergies.  MEDICATIONS:  Current Outpatient Medications  Medication Sig Dispense Refill   amLODipine (NORVASC) 5 MG tablet Take 1 tablet (5 mg total) by mouth daily. 90 tablet 3   aspirin-acetaminophen-caffeine (EXCEDRIN MIGRAINE) 161-096-04 MG per tablet Take 1 tablet by mouth every 6 (six) hours as needed for headache.     cholecalciferol (VITAMIN D) 1000 units  tablet Take 5,000 Units by mouth daily.     cyclobenzaprine (FLEXERIL) 10 MG tablet Take 1 tablet (10 mg total) by mouth at bedtime. (Patient taking differently: Take 10 mg by mouth at bedtime as needed. ) 20 tablet 0   ergocalciferol (VITAMIN D2) 50000 units capsule Take 1 capsule (50,000 Units total) by mouth once a week. Take 50,000 units by mouth weekly  X 8. 8 capsule 0   gabapentin (NEURONTIN) 300 MG capsule Take 1 capsule (300 mg total) by mouth 2 (two) times daily. Increase to 4x/day as needed 60 capsule 1   oxyCODONE (OXY IR/ROXICODONE) 5 MG immediate release tablet Take 1-2 tablets (5-10 mg total) by mouth every 6 (six) hours as needed for moderate pain, severe pain or breakthrough pain. 40 tablet 0   polyethylene glycol powder (GLYCOLAX/MIRALAX)  powder Take 17 g by mouth daily. (Patient taking differently: Take 17 g by mouth as needed. ) 3350 g 1   pregabalin (LYRICA) 25 MG capsule Take 25-50 mg by mouth See admin instructions. Taking 1 cap in the morning, and 2 caps in the evening     propranolol (INDERAL) 40 MG tablet Take 20 mg by mouth 2 (two) times daily.  0   SUMAtriptan (IMITREX) 100 MG tablet Take 1 tablet at earliest onset of headache.  May repeat once in 2 hours if headache persists or recurs. (Patient taking differently: Take 100 mg by mouth every 2 (two) hours as needed for migraine. Take 1 tablet at earliest onset of headache.  May repeat once in 2 hours if headache persists or recurs.) 10 tablet 2   tamoxifen (NOLVADEX) 20 MG tablet Take 1 tablet (20 mg total) by mouth daily. 90 tablet 3   topiramate (TOPAMAX) 50 MG tablet Take 1 tablet (50 mg total) by mouth at bedtime. 30 tablet 2   No current facility-administered medications for this visit.    PHYSICAL EXAMINATION: ECOG PERFORMANCE STATUS: 1 - Symptomatic but completely ambulatory  No vitals taken today, Exam not performed today   LABORATORY DATA:  I have reviewed the data as listed CBC Latest Ref Rng & Units 04/12/2019 07/11/2018 01/09/2018  WBC 4.0 - 10.5 K/uL 6.1 8.7 5.8  Hemoglobin 12.0 - 15.0 g/dL 13.2 12.2 13.0  Hematocrit 36.0 - 46.0 % 39.1 36.5 38.8  Platelets 150 - 400 K/uL 251 249 254     CMP Latest Ref Rng & Units 04/12/2019 07/11/2018 01/09/2018  Glucose 70 - 99 mg/dL 125(H) 100(H) 116  BUN 6 - 20 mg/dL _0 Creatinine 0.44 - 1.00 mg/dL 1.02(H) 0.92 0.91  Sodium 135 - 145 mmol/L 141 140 140  Potassium 3.5 - 5.1 mmol/L 3.6 3.3(L) 3.6  Chloride 98 - 111 mmol/L 107 107 106  CO2 22 - 32 mmol/L _1 Calcium 8.9 - 10.3 mg/dL 9.2 9.2 9.2  Total Protein 6.5 - 8.1 g/dL 7.9 7.0 7.6  Total Bilirubin 0.3 - 1.2 mg/dL 0.4 0.5 0.5  Alkaline Phos 38 - 126 U/L 90 87 88  AST 15 - 41 U/L _2 ALT 0 - 44 U/L _3 RADIOGRAPHIC  STUDIES: I have personally reviewed the radiological images as listed and agreed with the findings in the report. No results found.   ASSESSMENT & PLAN:  Victoria Delacruz is a 48 y.o. female with    1. H/o migraines, Recent worsening HA -She has history of migraine headache. Her 04/2012 Brain MRI was  unremarkable.  -She notes in the past 6 months she has been having HA which has worsened lately (>10/10). She has been taking Excedrin.  -She also has been feeling unstable lately with mood sings, blurred vision, HA and extra phlegm production in the AM. No fever or dyspnea. -I will obtain a brain MRI in 2-3 weeks to rule out brain metastasis, although my suspicion is not very high. She would like to want until after the holidays  -I discussed her mood swings and hot flashes can be related to Tamoxifen but it is unlikely causing her HA.  -I recommend a brain MRI to further evaluate. She is agreeable.  -F/u in 3-4 weeks with scan results.   2. Left breast invasive duct carcinoma (ER+,PR+,HER2 negative) , [id number], stage Ia diagnosed in 2011, with prior left DCIS in 2004 -She was initially diagnosed with left breast DCIS in 2004. She was treated with Left mastectomy.  -Unfortunately she had left breast cancer recurrence in 05/2010, invasive. She was treated with local excision surgery, adjuvant chemo CT and adjuvant radiation. She underwent breast reconstruction in 09/12/2018 with Dr Harlow Mares. She is satisfied with results.  -She was eligible and previously referred to genetics, but did not go.  -She has been taking Tamoxifen since 11/2010 held due to vaginal bleeding and restarted in 09/2013. She did have hysterectomy.  -Continue Surveillance and Tamoxifen.   2. HTN, Fibromyalgia, chronic body pain  -She follows up with her primary care physician. -Her fibromyalgia has been flaring more lately.    5. Rectal bleeding -Last colonoscopy in 2017 with Dr Earlean Shawl -She has had recurrent rectal  bleeding from internal hemorrhoids. She underwent surgery for removal by Dr. Johney Maine on 05/17/19.    Plan -continue tamoxifen -lab and Brain MRI in 2-3 weeks -F/u in 3 weeks     No problem-specific Assessment & Plan notes found for this encounter.   Orders Placed This Encounter  Procedures   MR Brain W Wo Contrast    Standing Status:   Future    Standing Expiration Date:   10/11/2020    Order Specific Question:   If indicated for the ordered procedure, I authorize the administration of contrast media per Radiology protocol    Answer:   Yes    Order Specific Question:   What is the patient's sedation requirement?    Answer:   No Sedation    Order Specific Question:   Does the patient have a pacemaker or implanted devices?    Answer:   No    Order Specific Question:   Use SRS Protocol?    Answer:   No    Order Specific Question:   Radiology Contrast Protocol - do NOT remove file path    Answer:   [email or web address]    Order Specific Question:   Preferred imaging location?    Answer:   [facility] (table limit-350 lbs)   I discussed the assessment and treatment plan with the patient. The patient was provided an opportunity to ask questions and all were answered. The patient agreed with the plan and demonstrated an understanding of the instructions.  The patient was advised to call back or seek an in-person evaluation if the symptoms worsen or if the condition fails to improve as anticipated.  I provided 22 minutes of face-to-face video visit time during this encounter, and > 50% was spent counseling as documented under my assessment & plan.    Truitt Merle, MD 10/12/2019   I, Joslyn Devon,  am acting as scribe for Truitt Merle, MD.   I have reviewed the above documentation for accuracy and completeness, and I agree with the above.

## 2019-10-12 ENCOUNTER — Other Ambulatory Visit: Payer: Medicare HMO

## 2019-10-12 ENCOUNTER — Encounter: Payer: Self-pay | Admitting: Hematology

## 2019-10-12 ENCOUNTER — Inpatient Hospital Stay: Payer: Medicare HMO | Attending: Hematology | Admitting: Hematology

## 2019-10-12 DIAGNOSIS — R519 Headache, unspecified: Secondary | ICD-10-CM

## 2019-10-12 DIAGNOSIS — Z9221 Personal history of antineoplastic chemotherapy: Secondary | ICD-10-CM

## 2019-10-12 DIAGNOSIS — Z170 Estrogen receptor positive status [ER+]: Secondary | ICD-10-CM

## 2019-10-12 DIAGNOSIS — G8929 Other chronic pain: Secondary | ICD-10-CM | POA: Diagnosis not present

## 2019-10-12 DIAGNOSIS — Z9013 Acquired absence of bilateral breasts and nipples: Secondary | ICD-10-CM

## 2019-10-12 DIAGNOSIS — C50412 Malignant neoplasm of upper-outer quadrant of left female breast: Secondary | ICD-10-CM | POA: Diagnosis not present

## 2019-10-15 ENCOUNTER — Telehealth: Payer: Self-pay | Admitting: Hematology

## 2019-10-15 NOTE — Telephone Encounter (Signed)
Scheduled appt per 12/11 los.  Spoke with pt and she is aware of her appt dates and time.,

## 2019-10-31 ENCOUNTER — Telehealth: Payer: Self-pay | Admitting: Hematology

## 2019-10-31 ENCOUNTER — Ambulatory Visit (HOSPITAL_COMMUNITY)
Admission: RE | Admit: 2019-10-31 | Discharge: 2019-10-31 | Disposition: A | Discharge status: Home / Self Care | Payer: Medicare HMO | Source: Ambulatory Visit | Attending: Hematology | Admitting: Hematology

## 2019-10-31 ENCOUNTER — Other Ambulatory Visit: Payer: Self-pay

## 2019-10-31 DIAGNOSIS — G8929 Other chronic pain: Secondary | ICD-10-CM

## 2019-10-31 DIAGNOSIS — R519 Headache, unspecified: Secondary | ICD-10-CM | POA: Diagnosis not present

## 2019-10-31 LAB — POCT I-STAT CREATININE: Creatinine, Ser: 0.8 mg/dL (ref 0.44–1.00)

## 2019-10-31 MED ORDER — GADOBUTROL 1 MMOL/ML IV SOLN
7.0000 mL | Freq: Once | INTRAVENOUS | Status: AC | PRN
  Administered 2019-10-31: 7 mL via INTRAVENOUS

## 2019-10-31 NOTE — Telephone Encounter (Signed)
Returned patient's phone call regarding cancelling 01/06 appointment, patient already had lab and mri done on 12/30.

## 2019-11-06 DIAGNOSIS — M79642 Pain in left hand: Secondary | ICD-10-CM | POA: Diagnosis not present

## 2019-11-06 DIAGNOSIS — E559 Vitamin D deficiency, unspecified: Secondary | ICD-10-CM | POA: Diagnosis not present

## 2019-11-06 DIAGNOSIS — I10 Essential (primary) hypertension: Secondary | ICD-10-CM | POA: Diagnosis not present

## 2019-11-06 DIAGNOSIS — M79641 Pain in right hand: Secondary | ICD-10-CM | POA: Diagnosis not present

## 2019-11-07 ENCOUNTER — Other Ambulatory Visit: Payer: Medicare HMO

## 2019-11-07 ENCOUNTER — Telehealth: Payer: Self-pay

## 2019-11-07 NOTE — Telephone Encounter (Signed)
I spoke with Ms Upton and let her know her Brain MRI was normal.  She verbalized understanding.

## 2019-11-09 ENCOUNTER — Inpatient Hospital Stay: Payer: Medicare HMO | Attending: Hematology | Admitting: Hematology

## 2019-11-09 ENCOUNTER — Telehealth: Payer: Self-pay | Admitting: Hematology

## 2019-11-09 NOTE — Telephone Encounter (Signed)
Called pt per 1/8 sch message - unable to reach pt .

## 2019-11-12 ENCOUNTER — Ambulatory Visit
Admission: RE | Admit: 2019-11-12 | Discharge: 2019-11-12 | Disposition: A | Discharge status: Home / Self Care | Payer: Medicare HMO | Source: Ambulatory Visit | Attending: Family Medicine | Admitting: Family Medicine

## 2019-11-12 ENCOUNTER — Other Ambulatory Visit: Payer: Self-pay | Admitting: Family Medicine

## 2019-11-12 DIAGNOSIS — R600 Localized edema: Secondary | ICD-10-CM | POA: Diagnosis not present

## 2019-11-12 DIAGNOSIS — M79641 Pain in right hand: Secondary | ICD-10-CM

## 2019-11-12 DIAGNOSIS — M79642 Pain in left hand: Secondary | ICD-10-CM

## 2019-11-25 DIAGNOSIS — Z20828 Contact with and (suspected) exposure to other viral communicable diseases: Secondary | ICD-10-CM | POA: Diagnosis not present

## 2019-12-04 DIAGNOSIS — Z20828 Contact with and (suspected) exposure to other viral communicable diseases: Secondary | ICD-10-CM | POA: Diagnosis not present

## 2019-12-04 DIAGNOSIS — U071 COVID-19: Secondary | ICD-10-CM | POA: Diagnosis not present

## 2019-12-12 DIAGNOSIS — Z03818 Encounter for observation for suspected exposure to other biological agents ruled out: Secondary | ICD-10-CM | POA: Diagnosis not present

## 2019-12-12 DIAGNOSIS — Z20828 Contact with and (suspected) exposure to other viral communicable diseases: Secondary | ICD-10-CM | POA: Diagnosis not present

## 2019-12-26 DIAGNOSIS — M2550 Pain in unspecified joint: Secondary | ICD-10-CM | POA: Diagnosis not present

## 2019-12-26 DIAGNOSIS — M064 Inflammatory polyarthropathy: Secondary | ICD-10-CM | POA: Diagnosis not present

## 2019-12-26 DIAGNOSIS — M791 Myalgia, unspecified site: Secondary | ICD-10-CM | POA: Diagnosis not present

## 2019-12-26 DIAGNOSIS — M359 Systemic involvement of connective tissue, unspecified: Secondary | ICD-10-CM | POA: Diagnosis not present

## 2019-12-26 DIAGNOSIS — R700 Elevated erythrocyte sedimentation rate: Secondary | ICD-10-CM | POA: Diagnosis not present

## 2019-12-26 DIAGNOSIS — I7300 Raynaud's syndrome without gangrene: Secondary | ICD-10-CM | POA: Diagnosis not present

## 2020-01-09 DIAGNOSIS — R700 Elevated erythrocyte sedimentation rate: Secondary | ICD-10-CM | POA: Diagnosis not present

## 2020-01-09 DIAGNOSIS — M2550 Pain in unspecified joint: Secondary | ICD-10-CM | POA: Diagnosis not present

## 2020-01-09 DIAGNOSIS — M791 Myalgia, unspecified site: Secondary | ICD-10-CM | POA: Diagnosis not present

## 2020-01-09 DIAGNOSIS — M359 Systemic involvement of connective tissue, unspecified: Secondary | ICD-10-CM | POA: Diagnosis not present

## 2020-01-16 DIAGNOSIS — J301 Allergic rhinitis due to pollen: Secondary | ICD-10-CM | POA: Diagnosis not present

## 2020-01-16 DIAGNOSIS — E559 Vitamin D deficiency, unspecified: Secondary | ICD-10-CM | POA: Diagnosis not present

## 2020-01-16 DIAGNOSIS — L989 Disorder of the skin and subcutaneous tissue, unspecified: Secondary | ICD-10-CM | POA: Diagnosis not present

## 2020-01-16 DIAGNOSIS — F419 Anxiety disorder, unspecified: Secondary | ICD-10-CM | POA: Diagnosis not present

## 2020-01-16 DIAGNOSIS — M797 Fibromyalgia: Secondary | ICD-10-CM | POA: Diagnosis not present

## 2020-01-16 DIAGNOSIS — I10 Essential (primary) hypertension: Secondary | ICD-10-CM | POA: Diagnosis not present

## 2020-01-16 DIAGNOSIS — C50912 Malignant neoplasm of unspecified site of left female breast: Secondary | ICD-10-CM | POA: Diagnosis not present

## 2020-01-16 DIAGNOSIS — Z0000 Encounter for general adult medical examination without abnormal findings: Secondary | ICD-10-CM | POA: Diagnosis not present

## 2020-01-16 DIAGNOSIS — F324 Major depressive disorder, single episode, in partial remission: Secondary | ICD-10-CM | POA: Diagnosis not present

## 2020-01-28 ENCOUNTER — Other Ambulatory Visit: Payer: Self-pay | Admitting: Family Medicine

## 2020-01-28 DIAGNOSIS — Z1231 Encounter for screening mammogram for malignant neoplasm of breast: Secondary | ICD-10-CM

## 2020-01-29 ENCOUNTER — Other Ambulatory Visit: Payer: Self-pay | Admitting: Family Medicine

## 2020-01-29 ENCOUNTER — Other Ambulatory Visit: Payer: Self-pay

## 2020-01-29 ENCOUNTER — Ambulatory Visit
Admission: RE | Admit: 2020-01-29 | Discharge: 2020-01-29 | Disposition: A | Discharge status: Home / Self Care | Payer: Medicare HMO | Source: Ambulatory Visit | Attending: Family Medicine | Admitting: Family Medicine

## 2020-01-29 DIAGNOSIS — Z1231 Encounter for screening mammogram for malignant neoplasm of breast: Secondary | ICD-10-CM

## 2020-02-02 DIAGNOSIS — N939 Abnormal uterine and vaginal bleeding, unspecified: Secondary | ICD-10-CM | POA: Diagnosis not present

## 2020-02-02 DIAGNOSIS — R319 Hematuria, unspecified: Secondary | ICD-10-CM | POA: Diagnosis not present

## 2020-02-06 ENCOUNTER — Other Ambulatory Visit: Payer: Self-pay

## 2020-02-06 NOTE — Progress Notes (Signed)
[place]   [phone] [phone]   Clinic Follow up Note   Patient Care Team: Harlan Stains, MD as PCP - General (Family Medicine) [name], MD as Consulting Physician (General Surgery) Richmond Campbell, MD as Consulting Physician (Gastroenterology) Crissie Reese, MD as Consulting Physician (Plastic Surgery) Michael Boston, MD as Consulting Physician (Colon and Rectal Surgery)  Date of Service:  02/08/2020  CHIEF COMPLAINT: F/u of left breast cancer  SUMMARY OF ONCOLOGIC HISTORY: Oncology History  Malignant neoplasm of female breast ([place])  08/02/2003 Initial Diagnosis   MALIGNANT NEOPLASM OF BREAST UNSPECIFIED SITE. High-grade DCIS of Left breast; Tumor was 4.0 cm. Sentinel lymph nodes were negative.  ER, 24%; PR 2%.  Patient had saline implant.    08/02/2003 Surgery   L mastectomy with reconstruction [name]   05/07/2010 Surgery   R port-a-cath placement   05/11/2010 Relapse/Recurrence   Invasive ductal carcinoma of L breast; ER: 99%, positive; PR: 53%, positive; Ki 67 (mib-1): 20%; hER@ neu by FISH without ampliication; ratio of her 2 CEP 17 was 1.17.    05/28/2010 Surgery   Local excision of the L invasive ductal carcinoma by [name].   06/08/2010 - 08/10/2010 Chemotherapy   Completed 4 cycles of adjuvant chemotherapy with cytoxan and taxotere in combination with neulasta    09/08/2010 - 11/04/2010 Radiation Therapy   XRT was given to the left breast and chall wall consisting of 4780 in 26 fractions with an 1800 cGy boost in 9 fractions Dr Valere Dross.   11/04/2010 - 04/17/2011 Chemotherapy   Tamoxifen taken sporadically and discontinued due to vaginal bleeding.    08/30/2011 Surgery   Laparoscopic assisted vaginal hysterectomy.   04/10/2012 Imaging   MRI of the brain normal.  No cause of the patient's headache identified.    12/17/2012 Imaging   CT scan of abdomen and pelvis showed no acute findings in the abdomen or pelvis.   There were felt to be hepatic hemangiomas present, mesuring 2.5 cm in the superior right heaptic lobe at the hepatic dome and a 7 mm lesion located anteriorly.    08/23/2013 Imaging   Digital diagnostic unilateral right mammogram showed no mamographic or sonographic evidence of malignancy, right breast.      09/11/2013 -  Anti-estrogen oral therapy   Tamoxifen 20 mg once daily, planning for 10 years.   11/26/2016 Imaging   MR Cervical Spine W WO Contrast IMPRESSION: Small left sided disc protrusion at C5-6 extending into the foramen. This could be a source of radiculopathy involving the left C6 nerve root. Diffusely abnormal bone marrow likely related to prior chemotherapy or anemia.   03/17/2017 Imaging   US Abdomen IMPRESSION: No acute or focal abnormality identified.    03/17/2017 Imaging   US Abdomen Complete  IMPRESSION: No acute or focal abnormality identified.   03/23/2018 Imaging   03/23/2018 Mammogram IMPRESSION: No evidence of malignancy.   09/12/2018 Surgery   She underwent breast reconstruction on 09/12/2018 with Dr Harlow Mares      CURRENT THERAPY:  Tamoxifen started in 11/2009, held 04/2011-08/2013, and restarted on 09/11/2013   INTERVAL HISTORY:  Victoria Delacruz is here for a follow up of left breast cancer. She was last seen by me 4 months ago. She presents to the clinic alone. She notes she is doing fine. She notes she has been stressed while taking care of her teenage niece and a toddler foster child. She notes physically she went to Walk in clinic at [facility]. She  had blood when she wiped but not sure where. She had no indication of blood in vagina so she was referred to urologist. She denies dysuria and her urine cultures were negative.  She notes she has been coughing up phlegm mostly in the morning and some through the day. She notes this haas been going on since 10/2019. She denies chest pain or SOB. She has not brought this up with her PCP. She denies weight  loss. She notes she has been walking and eating less junk food. She notes she felt coldness and pain when she drank cold substances. She notes she had both her vaccinations.    REVIEW OF SYSTEMS:   Constitutional: Denies fevers, chills or abnormal weight loss Eyes: Denies blurriness of vision Ears, nose, mouth, throat, and face: Denies mucositis or sore throat Respiratory: Denies dyspnea or wheezes (+) Coughing up phlegm Cardiovascular: Denies palpitation, chest discomfort or lower extremity swelling Gastrointestinal:  Denies nausea, heartburn or change in bowel habits Skin: Denies abnormal skin rashes Lymphatics: Denies new lymphadenopathy or easy bruising Neurological:Denies numbness, tingling or new weaknesses Behavioral/Psych: Mood is stable, no new changes  All other systems were reviewed with the patient and are negative.  MEDICAL HISTORY:  Past Medical History:  Diagnosis Date  . Anemia   . Arthritis   . Blood transfusion 05/17/11, 05/25/11   anemia  . Breast cancer ([place]) 2004 and 2011  . Depression   . Dizziness   . ECTOPIC PREGNANCY 1997 and 2011   x 2  . Family history of breast cancer    grandmother  . Fibroid   . Fibromyalgia   . Hemorrhoids 2020  . Hypertension   . Personal history of radiation therapy 2011    SURGICAL HISTORY: Past Surgical History:  Procedure Laterality Date  . ABDOMINAL HYSTERECTOMY    . AUGMENTATION MAMMAPLASTY Left   . BREAST BIOPSY Left 07/05/2003   malignant  . BREAST BIOPSY Left 2011   malignant  . EVALUATION UNDER ANESTHESIA WITH HEMORRHOIDECTOMY N/A 05/17/2019   Procedure: HEMORRHOIDECTOMY, HEMORRHOID LIGATION/PEXY, ANORECTAL EXAM UNDER ANESTHESIA., REMOVAL ANAL CRYPT POLYP;  Surgeon: Michael Boston, MD;  Location: [facility];  Service: General;  Laterality: N/A;  . LAPAROSCOPIC ASSISTED VAGINAL HYSTERECTOMY  08/30/2011   Procedure: LAPAROSCOPIC ASSISTED VAGINAL HYSTERECTOMY;  Surgeon: Osborne Oman, MD;   Location: [facility];  Service: Gynecology;  Laterality: N/A;  . LAPAROSCOPY FOR ECTOPIC PREGNANCY    . LAPAROSCOPY W/ MINI-LAPAROTOMY    . MASTECTOMY Left 2004   complete with reconstruction x 3, no b/p punctures to left arm  . RECONSTRUCTION BREAST IMMEDIATE / DELAYED W/ TISSUE EXPANDER  2004  . REDUCTION MAMMAPLASTY Right     I have reviewed the social history and family history with the patient and they are unchanged from previous note.  ALLERGIES:  has No Known Allergies.  MEDICATIONS:  Current Outpatient Medications  Medication Sig Dispense Refill  . amLODipine (NORVASC) 5 MG tablet Take 1 tablet (5 mg total) by mouth daily. 90 tablet 3  . aspirin-acetaminophen-caffeine (EXCEDRIN MIGRAINE) 973-532-99 MG per tablet Take 1 tablet by mouth every 6 (six) hours as needed for headache.    . cholecalciferol (VITAMIN D) 1000 units tablet Take 5,000 Units by mouth daily.    . cyclobenzaprine (FLEXERIL) 10 MG tablet Take 1 tablet (10 mg total) by mouth at bedtime. (Patient taking differently: Take 10 mg by mouth at bedtime as needed. ) 20 tablet 0  . LORazepam (ATIVAN) 0.5 MG tablet     .  naproxen (NAPROSYN) 500 MG tablet     . pregabalin (LYRICA) 25 MG capsule Take 25-50 mg by mouth See admin instructions. Taking 1 cap in the morning, and 2 caps in the evening    . propranolol (INDERAL) 20 MG tablet     . SUMAtriptan (IMITREX) 100 MG tablet Take 1 tablet at earliest onset of headache.  May repeat once in 2 hours if headache persists or recurs. (Patient taking differently: Take 100 mg by mouth every 2 (two) hours as needed for migraine. Take 1 tablet at earliest onset of headache.  May repeat once in 2 hours if headache persists or recurs.) 10 tablet 2  . tamoxifen (NOLVADEX) 20 MG tablet Take 1 tablet (20 mg total) by mouth daily. 90 tablet 3  . topiramate (TOPAMAX) 50 MG tablet Take 1 tablet (50 mg total) by mouth at bedtime. 30 tablet 2   No current facility-administered medications for this  visit.    PHYSICAL EXAMINATION: ECOG PERFORMANCE STATUS: 1 - Symptomatic but completely ambulatory  Vitals:   02/08/20 1108  BP: 135/86  Pulse: 60  Resp: 17  Temp: 98.3 F (36.8 C)  SpO2: 97%   Filed Weights   02/08/20 1108  Weight: 175 lb 9.6 oz (79.7 kg)    GENERAL:alert, no distress and comfortable SKIN: skin color, texture, turgor are normal, no rashes or significant lesions EYES: normal, Conjunctiva are pink and non-injected, sclera clear  NECK: supple, thyroid normal size, non-tender, without nodularity LYMPH:  no palpable lymphadenopathy in the cervical, axillary  LUNGS: clear to auscultation and percussion with normal breathing effort HEART: regular rate & rhythm and no murmurs and no lower extremity edema ABDOMEN:abdomen soft, non-tender and normal bowel sounds Musculoskeletal:no cyanosis of digits and no clubbing  NEURO: alert & oriented x 3 with fluent speech, no focal motor/sensory deficits BREAST: S/l left mastectomy and reconstruction: Surgical incision healed well. No palpable mass, nodules or adenopathy bilaterally. Breast exam benign.   LABORATORY DATA:  I have reviewed the data as listed CBC Latest Ref Rng & Units 02/08/2020 04/12/2019 07/11/2018  WBC 4.0 - 10.5 K/uL 6.4 6.1 8.7  Hemoglobin 12.0 - 15.0 g/dL 12.3 13.2 12.2  Hematocrit 36.0 - 46.0 % 37.4 39.1 36.5  Platelets 150 - 400 K/uL 255 251 249     CMP Latest Ref Rng & Units 02/08/2020 10/31/2019 04/12/2019  Glucose 70 - 99 mg/dL 119(H) - 125(H)  BUN 6 - 20 mg/dL 11 - 9  Creatinine 0.44 - 1.00 mg/dL 0.88 0.80 1.02(H)  Sodium 135 - 145 mmol/L 140 - 141  Potassium 3.5 - 5.1 mmol/L 3.7 - 3.6  Chloride 98 - 111 mmol/L 108 - 107  CO2 22 - 32 mmol/L 26 - 25  Calcium 8.9 - 10.3 mg/dL 9.1 - 9.2  Total Protein 6.5 - 8.1 g/dL 7.7 - 7.9  Total Bilirubin 0.3 - 1.2 mg/dL 0.4 - 0.4  Alkaline Phos 38 - 126 U/L 90 - 90  AST 15 - 41 U/L 20 - 21  ALT 0 - 44 U/L 13 - 14      RADIOGRAPHIC STUDIES: I have  personally reviewed the radiological images as listed and agreed with the findings in the report. No results found.   ASSESSMENT & PLAN:  Victoria Delacruz is a 49 y.o. female with    1. Left breast invasive duct carcinoma (ER+,PR+,HER2 negative), [id number], stage Ia diagnosed in 2011, with prior left DCIS in 2004 -She wasinitiallydiagnosed with left breast DCIS in 2004. She  was treated with Left mastectomy.  -Unfortunately she had left breast cancer recurrence in 05/2010, invasive. She was treated with local excision surgery, adjuvant chemo CT and adjuvant radiation.She underwent breast reconstruction in 09/12/2018 with Dr Harlow Mares. She is satisfied with results.  -She was eligible andpreviouslyreferred to genetics, but did not go.  -She has been taking Tamoxifen since 1/2011held due to vaginal bleeding and restarted in 09/2013.She did have hysterectomy.  -She is clinically doing well. Lab reviewed, her CBC and CMP are within normal limits except BG 119. Her physical exam and her 12/2019 mammogram were unremarkable. There is no clinical concern for recurrence. -Continue surveillance. Next mammogram in 12/2020 -Continue Tamoxifen.  -f/u in 1 year.    2. HTN, Fibromyalgia, chronic body pain  -She follows up with her primary care physician. -Her fibromyalgia has been flaring more lately.    3.Rectalbleeding -Last colonoscopy in 2017 with Dr Earlean Shawl -She has had recurrent rectal bleeding from internal hemorrhoids. She underwent surgery for removal by Dr. Johney Maine on 05/17/19.   -She did have a recently episode of bleeding which is suspected to be from bladder based on Urgent care workup. She plans to be seen by urology soon.   4. H/o migraines, Recent worsening HA -She has history of migraine headache. Her 04/2012 Brain MRI was unremarkable.  -She notes in the past 6 months she has been having HA which has worsened lately (>10/10). She has been taking Excedrin.  -She also has been  feeling unstable lately with mood sings, blurred vision, HA and extra phlegm production in the AM. No fever or dyspnea. -Her 10/30/20 MRI brain was normal.  -I discussed her mood swings and hot flashes can be related to Tamoxifen but it is unlikely causing her HA.    5. Productive cough  -She notes this has been going on since 10/2019 with nasal congestion  -She denies other respiratory issues, exam clear today with mild tenderness of facial sinus. -She notes cold and painful feeling drinking very cold drinks for 1-2 weeks. I discussed this could be from inflammation of esophagus.  -She is interested in chest xray, will proceed on Monday    Plan -continue tamoxifen -Chest Xray on Monday. Will call with results  -Mammogram in 12/2020 -Lab and F/u in 1 year    No problem-specific Assessment & Plan notes found for this encounter.   Orders Placed This Encounter  Procedures  . DG Chest 2 View    Standing Status:   Future    Standing Expiration Date:   02/07/2021    Order Specific Question:   Reason for Exam (SYMPTOM  OR DIAGNOSIS REQUIRED)    Answer:   procuctive cough for 3-4 months    Order Specific Question:   Is patient pregnant?    Answer:   No    Order Specific Question:   Preferred imaging location?    Answer:   [facility]    Order Specific Question:   Radiology Contrast Protocol - do NOT remove file path    Answer:   [email or web address]  . MM Digital Screening Unilat R    Standing Status:   Future    Standing Expiration Date:   02/07/2021    Order Specific Question:   Reason for Exam (SYMPTOM  OR DIAGNOSIS REQUIRED)    Answer:   SCREENING    Order Specific Question:   Is the patient pregnant?    Answer:   No    Order Specific Question:   Preferred  imaging location?    Answer:   [facility]   All questions were answered. The patient knows to call the clinic with any problems, questions or concerns. No barriers to  learning was detected. The total time spent in the appointment was 30 minutes.     Truitt Merle, MD 02/08/2020   I, Joslyn Devon, am acting as scribe for Truitt Merle, MD.   I have reviewed the above documentation for accuracy and completeness, and I agree with the above.

## 2020-02-07 DIAGNOSIS — R310 Gross hematuria: Secondary | ICD-10-CM | POA: Diagnosis not present

## 2020-02-08 ENCOUNTER — Other Ambulatory Visit: Payer: Self-pay

## 2020-02-08 ENCOUNTER — Encounter: Payer: Self-pay | Admitting: Hematology

## 2020-02-08 ENCOUNTER — Inpatient Hospital Stay: Payer: Medicare HMO | Attending: Hematology

## 2020-02-08 ENCOUNTER — Inpatient Hospital Stay: Payer: Medicare HMO | Admitting: Hematology

## 2020-02-08 VITALS — BP 135/86 | HR 60 | Temp 98.3°F | Resp 17 | Ht 63.0 in | Wt 175.6 lb

## 2020-02-08 DIAGNOSIS — Z170 Estrogen receptor positive status [ER+]: Secondary | ICD-10-CM

## 2020-02-08 DIAGNOSIS — Z1231 Encounter for screening mammogram for malignant neoplasm of breast: Secondary | ICD-10-CM | POA: Diagnosis not present

## 2020-02-08 DIAGNOSIS — I10 Essential (primary) hypertension: Secondary | ICD-10-CM | POA: Insufficient documentation

## 2020-02-08 DIAGNOSIS — C50912 Malignant neoplasm of unspecified site of left female breast: Secondary | ICD-10-CM | POA: Diagnosis not present

## 2020-02-08 DIAGNOSIS — Z79899 Other long term (current) drug therapy: Secondary | ICD-10-CM | POA: Diagnosis not present

## 2020-02-08 DIAGNOSIS — R05 Cough: Secondary | ICD-10-CM | POA: Insufficient documentation

## 2020-02-08 DIAGNOSIS — Z79810 Long term (current) use of selective estrogen receptor modulators (SERMs): Secondary | ICD-10-CM | POA: Diagnosis not present

## 2020-02-08 DIAGNOSIS — Z791 Long term (current) use of non-steroidal anti-inflammatories (NSAID): Secondary | ICD-10-CM | POA: Diagnosis not present

## 2020-02-08 DIAGNOSIS — Z923 Personal history of irradiation: Secondary | ICD-10-CM | POA: Insufficient documentation

## 2020-02-08 DIAGNOSIS — M797 Fibromyalgia: Secondary | ICD-10-CM | POA: Insufficient documentation

## 2020-02-08 DIAGNOSIS — Z9221 Personal history of antineoplastic chemotherapy: Secondary | ICD-10-CM | POA: Insufficient documentation

## 2020-02-08 DIAGNOSIS — C50412 Malignant neoplasm of upper-outer quadrant of left female breast: Secondary | ICD-10-CM | POA: Diagnosis not present

## 2020-02-08 DIAGNOSIS — Z9012 Acquired absence of left breast and nipple: Secondary | ICD-10-CM | POA: Insufficient documentation

## 2020-02-08 LAB — COMPREHENSIVE METABOLIC PANEL
ALT: 13 U/L (ref 0–44)
AST: 20 U/L (ref 15–41)
Albumin: 3.7 g/dL (ref 3.5–5.0)
Alkaline Phosphatase: 90 U/L (ref 38–126)
Anion gap: 6 (ref 5–15)
BUN: 11 mg/dL (ref 6–20)
CO2: 26 mmol/L (ref 22–32)
Calcium: 9.1 mg/dL (ref 8.9–10.3)
Chloride: 108 mmol/L (ref 98–111)
Creatinine, Ser: 0.88 mg/dL (ref 0.44–1.00)
GFR calc Af Amer: >60 mL/min (ref >60)
GFR calc non Af Amer: >60 mL/min (ref >60)
Glucose, Bld: 119 mg/dL — ABNORMAL HIGH (ref 70–99)
Potassium: 3.7 mmol/L (ref 3.5–5.1)
Sodium: 140 mmol/L (ref 135–145)
Total Bilirubin: 0.4 mg/dL (ref 0.3–1.2)
Total Protein: 7.7 g/dL (ref 6.5–8.1)

## 2020-02-08 LAB — CBC WITH DIFFERENTIAL/PLATELET
Abs Immature Granulocytes: 0.02 10*3/uL (ref 0.00–0.07)
Basophils Absolute: 0.1 10*3/uL (ref 0.0–0.1)
Basophils Relative: 1 %
Eosinophils Absolute: 0.2 10*3/uL (ref 0.0–0.5)
Eosinophils Relative: 3 %
HCT: 37.4 % (ref 36.0–46.0)
Hemoglobin: 12.3 g/dL (ref 12.0–15.0)
Immature Granulocytes: 0 %
Lymphocytes Relative: 40 %
Lymphs Abs: 2.6 10*3/uL (ref 0.7–4.0)
MCH: 30.4 pg (ref 26.0–34.0)
MCHC: 32.9 g/dL (ref 30.0–36.0)
MCV: 92.6 fL (ref 80.0–100.0)
Monocytes Absolute: 0.2 10*3/uL (ref 0.1–1.0)
Monocytes Relative: 3 %
Neutro Abs: 3.4 10*3/uL (ref 1.7–7.7)
Neutrophils Relative %: 53 %
Platelets: 255 10*3/uL (ref 150–400)
RBC: 4.04 MIL/uL (ref 3.87–5.11)
RDW: 13.2 % (ref 11.5–15.5)
WBC: 6.4 10*3/uL (ref 4.0–10.5)
nRBC: 0 % (ref 0.0–0.2)

## 2020-02-11 ENCOUNTER — Ambulatory Visit (HOSPITAL_COMMUNITY)
Admission: RE | Admit: 2020-02-11 | Discharge: 2020-02-11 | Disposition: A | Discharge status: Home / Self Care | Payer: Medicare HMO | Source: Ambulatory Visit | Attending: Hematology | Admitting: Hematology

## 2020-02-11 ENCOUNTER — Telehealth: Payer: Self-pay | Admitting: Hematology

## 2020-02-11 ENCOUNTER — Other Ambulatory Visit: Payer: Self-pay

## 2020-02-11 DIAGNOSIS — R05 Cough: Secondary | ICD-10-CM | POA: Diagnosis not present

## 2020-02-11 DIAGNOSIS — C50412 Malignant neoplasm of upper-outer quadrant of left female breast: Secondary | ICD-10-CM | POA: Insufficient documentation

## 2020-02-11 DIAGNOSIS — Z170 Estrogen receptor positive status [ER+]: Secondary | ICD-10-CM | POA: Insufficient documentation

## 2020-02-11 NOTE — Telephone Encounter (Signed)
Scheduled appt per 4/9 los.  Printed and mailed appt calendar

## 2020-02-12 ENCOUNTER — Telehealth: Payer: Self-pay

## 2020-02-12 NOTE — Telephone Encounter (Signed)
-----   Message from Truitt Merle, MD sent at 02/11/2020 11:19 PM EDT ----- Please let pt know her CXR was normal today. Thanks  Truitt Merle

## 2020-02-12 NOTE — Telephone Encounter (Signed)
I spoke with Ms Victoria Delacruz and let her know her CXR was normal. She verbalized understanding

## 2020-04-18 DIAGNOSIS — R350 Frequency of micturition: Secondary | ICD-10-CM | POA: Diagnosis not present

## 2020-04-18 DIAGNOSIS — R310 Gross hematuria: Secondary | ICD-10-CM | POA: Diagnosis not present

## 2020-04-21 DIAGNOSIS — R1310 Dysphagia, unspecified: Secondary | ICD-10-CM | POA: Diagnosis not present

## 2020-05-01 DIAGNOSIS — K7689 Other specified diseases of liver: Secondary | ICD-10-CM | POA: Diagnosis not present

## 2020-05-01 DIAGNOSIS — K589 Irritable bowel syndrome without diarrhea: Secondary | ICD-10-CM | POA: Diagnosis not present

## 2020-05-01 DIAGNOSIS — R310 Gross hematuria: Secondary | ICD-10-CM | POA: Diagnosis not present

## 2020-05-01 DIAGNOSIS — D1803 Hemangioma of intra-abdominal structures: Secondary | ICD-10-CM | POA: Diagnosis not present

## 2020-05-01 DIAGNOSIS — K529 Noninfective gastroenteritis and colitis, unspecified: Secondary | ICD-10-CM | POA: Diagnosis not present

## 2020-05-06 DIAGNOSIS — R310 Gross hematuria: Secondary | ICD-10-CM | POA: Diagnosis not present

## 2020-05-14 DIAGNOSIS — K5900 Constipation, unspecified: Secondary | ICD-10-CM | POA: Diagnosis not present

## 2020-05-14 DIAGNOSIS — F458 Other somatoform disorders: Secondary | ICD-10-CM | POA: Diagnosis not present

## 2020-05-14 DIAGNOSIS — R933 Abnormal findings on diagnostic imaging of other parts of digestive tract: Secondary | ICD-10-CM | POA: Diagnosis not present

## 2020-05-14 DIAGNOSIS — Z853 Personal history of malignant neoplasm of breast: Secondary | ICD-10-CM | POA: Diagnosis not present

## 2020-05-14 DIAGNOSIS — R042 Hemoptysis: Secondary | ICD-10-CM | POA: Diagnosis not present

## 2020-05-14 DIAGNOSIS — R932 Abnormal findings on diagnostic imaging of liver and biliary tract: Secondary | ICD-10-CM | POA: Diagnosis not present

## 2020-05-14 DIAGNOSIS — R6889 Other general symptoms and signs: Secondary | ICD-10-CM | POA: Diagnosis not present

## 2020-05-14 DIAGNOSIS — R112 Nausea with vomiting, unspecified: Secondary | ICD-10-CM | POA: Diagnosis not present

## 2020-05-14 DIAGNOSIS — Z791 Long term (current) use of non-steroidal anti-inflammatories (NSAID): Secondary | ICD-10-CM | POA: Diagnosis not present

## 2020-05-22 DIAGNOSIS — R1319 Other dysphagia: Secondary | ICD-10-CM | POA: Diagnosis not present

## 2020-06-03 DIAGNOSIS — K8050 Calculus of bile duct without cholangitis or cholecystitis without obstruction: Secondary | ICD-10-CM | POA: Diagnosis not present

## 2020-06-03 DIAGNOSIS — R932 Abnormal findings on diagnostic imaging of liver and biliary tract: Secondary | ICD-10-CM | POA: Diagnosis not present

## 2020-07-10 DIAGNOSIS — K219 Gastro-esophageal reflux disease without esophagitis: Secondary | ICD-10-CM | POA: Diagnosis not present

## 2020-07-10 DIAGNOSIS — K5289 Other specified noninfective gastroenteritis and colitis: Secondary | ICD-10-CM | POA: Diagnosis not present

## 2020-07-10 DIAGNOSIS — R1032 Left lower quadrant pain: Secondary | ICD-10-CM | POA: Diagnosis not present

## 2020-07-10 DIAGNOSIS — K921 Melena: Secondary | ICD-10-CM | POA: Diagnosis not present

## 2020-07-10 DIAGNOSIS — K6289 Other specified diseases of anus and rectum: Secondary | ICD-10-CM | POA: Diagnosis not present

## 2020-07-10 DIAGNOSIS — K5900 Constipation, unspecified: Secondary | ICD-10-CM | POA: Diagnosis not present

## 2020-07-10 DIAGNOSIS — R933 Abnormal findings on diagnostic imaging of other parts of digestive tract: Secondary | ICD-10-CM | POA: Diagnosis not present

## 2020-07-10 DIAGNOSIS — R935 Abnormal findings on diagnostic imaging of other abdominal regions, including retroperitoneum: Secondary | ICD-10-CM | POA: Diagnosis not present

## 2020-07-10 DIAGNOSIS — R932 Abnormal findings on diagnostic imaging of liver and biliary tract: Secondary | ICD-10-CM | POA: Diagnosis not present

## 2020-07-10 DIAGNOSIS — R05 Cough: Secondary | ICD-10-CM | POA: Diagnosis not present

## 2020-07-10 DIAGNOSIS — R1310 Dysphagia, unspecified: Secondary | ICD-10-CM | POA: Diagnosis not present

## 2020-07-10 DIAGNOSIS — R112 Nausea with vomiting, unspecified: Secondary | ICD-10-CM | POA: Diagnosis not present

## 2020-07-18 DIAGNOSIS — I10 Essential (primary) hypertension: Secondary | ICD-10-CM | POA: Diagnosis not present

## 2020-07-18 DIAGNOSIS — C50912 Malignant neoplasm of unspecified site of left female breast: Secondary | ICD-10-CM | POA: Diagnosis not present

## 2020-07-18 DIAGNOSIS — F419 Anxiety disorder, unspecified: Secondary | ICD-10-CM | POA: Diagnosis not present

## 2020-07-18 DIAGNOSIS — Z23 Encounter for immunization: Secondary | ICD-10-CM | POA: Diagnosis not present

## 2020-07-18 DIAGNOSIS — M797 Fibromyalgia: Secondary | ICD-10-CM | POA: Diagnosis not present

## 2020-12-04 DIAGNOSIS — R0781 Pleurodynia: Secondary | ICD-10-CM | POA: Diagnosis not present

## 2020-12-04 DIAGNOSIS — Z8616 Personal history of COVID-19: Secondary | ICD-10-CM | POA: Diagnosis not present

## 2020-12-04 DIAGNOSIS — M5030 Other cervical disc degeneration, unspecified cervical region: Secondary | ICD-10-CM | POA: Diagnosis not present

## 2020-12-04 DIAGNOSIS — R053 Chronic cough: Secondary | ICD-10-CM | POA: Diagnosis not present

## 2021-01-15 ENCOUNTER — Ambulatory Visit: Payer: Medicare Other | Admitting: Internal Medicine

## 2021-01-15 ENCOUNTER — Encounter: Payer: Self-pay | Admitting: Internal Medicine

## 2021-01-15 ENCOUNTER — Other Ambulatory Visit: Payer: Self-pay

## 2021-01-15 VITALS — BP 108/64 | HR 55 | Temp 97.3°F | Ht 63.0 in | Wt 176.0 lb

## 2021-01-15 DIAGNOSIS — R0602 Shortness of breath: Secondary | ICD-10-CM | POA: Diagnosis not present

## 2021-01-15 DIAGNOSIS — R053 Chronic cough: Secondary | ICD-10-CM

## 2021-01-15 MED ORDER — CETIRIZINE HCL 10 MG PO CAPS: 1.0000 | ORAL_CAPSULE | Freq: Every day | ORAL | 5 refills | Status: DC

## 2021-01-15 NOTE — Progress Notes (Signed)
[name]    [id number]    [birth year]  Primary Care Physician:White, Caren Griffins, MD  Referring Physician: Antony Contras, MD [place],  [place] [address] Reason for Consultation: Chronic cough Date of Consultation: 01/15/2021  Chief complaint:   Chief Complaint  Patient presents with  . Consult    Productive cough at times/ SOB x 3 months/ Covid in Feb 2022     HPI: Victoria Delacruz is a 50 y.o. woman with history of breast cancer s/p mastectomy who presents for new patient evaluation for cough. This has been going on for over a year since Jan 2021 when she had covid. Cough has not improved since this time.  Cough is about every day of every other day. Cough wakes her up from her sleep. Cough is sometimes dry and sometimes with mucus production. She has had blood come up before, last time 3 weeks ago - some specks mixed with mucus.   She took PPI for 30 days and did not have any improvement. She does have some reflux. She is no longer taking anything for this. She denies itchy/watery eyes, runny nose. She does have a sore throat. She does have frequent throat clearing.   Denies prior pneumonia/bronchitis. Never had cough like this before. She gets sob sometimes when walking her dog.   Social history: Occupation - does not work outside the home.  Exposures: lives at home with husband. They are also foster parents and they have a 43 year old. And their niece lives with them. [employer].  Smoking history: never smoker, husband used to smoke.   Social History   Occupational History  . Not on file  Tobacco Use  . Smoking status: Never Smoker  . Smokeless tobacco: Never Used  Substance and Sexual Activity  . Alcohol use: No    Alcohol/week: 0.0 standard drinks  . Drug use: No  . Sexual activity: Yes    Partners: Male    Birth control/protection: None    Relevant family history:  Family History  Problem Relation Age of Onset  .  Hypertension Mother   . Chronic bronchitis Mother   . Hypertension Father        History of blood clots in lungs  . COPD Father   . Lymphoma Sister        Hodgkins  . Cancer Sister        lung  . Cancer Maternal Grandmother        breast  . Breast cancer Maternal Grandmother   . Cancer Maternal Grandfather        lung  . Cancer Maternal Aunt        breast   . Pulmonary fibrosis Maternal Aunt   . Cancer Maternal Uncle        prostate/ bone     Past Medical History:  Diagnosis Date  . Anemia   . Arthritis   . Blood transfusion 05/17/11, 05/25/11   anemia  . Breast cancer ([place]) 2004 and 2011  . Depression   . Dizziness   . ECTOPIC PREGNANCY 1997 and 2011   x 2  . Family history of breast cancer    grandmother  . Fibroid   . Fibromyalgia   . Hemorrhoids 2020  . Hypertension   . Personal history of radiation therapy 2011    Past Surgical History:  Procedure Laterality Date  . ABDOMINAL HYSTERECTOMY    . AUGMENTATION  MAMMAPLASTY Left   . BREAST BIOPSY Left 07/05/2003   malignant  . BREAST BIOPSY Left 2011   malignant  . EVALUATION UNDER ANESTHESIA WITH HEMORRHOIDECTOMY N/A 05/17/2019   Procedure: HEMORRHOIDECTOMY, HEMORRHOID LIGATION/PEXY, ANORECTAL EXAM UNDER ANESTHESIA., REMOVAL ANAL CRYPT POLYP;  Surgeon: Michael Boston, MD;  Location: [facility];  Service: General;  Laterality: N/A;  . LAPAROSCOPIC ASSISTED VAGINAL HYSTERECTOMY  08/30/2011   Procedure: LAPAROSCOPIC ASSISTED VAGINAL HYSTERECTOMY;  Surgeon: Osborne Oman, MD;  Location: [facility];  Service: Gynecology;  Laterality: N/A;  . LAPAROSCOPY FOR ECTOPIC PREGNANCY    . LAPAROSCOPY W/ MINI-LAPAROTOMY    . MASTECTOMY Left 2004   complete with reconstruction x 3, no b/p punctures to left arm  . RECONSTRUCTION BREAST IMMEDIATE / DELAYED W/ TISSUE EXPANDER  2004  . REDUCTION MAMMAPLASTY Right      Physical Exam: Blood pressure 108/64, pulse (!) 55, temperature (!) 97.3 F (36.3 C),  temperature source Temporal, height _0  (1.6 m), weight 176 lb (79.8 kg), last menstrual period 04/23/2011, SpO2 100 %. Gen:      No acute distress ENT:  no nasal polyps, mucus membranes moist, mallampati IV Lungs:    No increased respiratory effort, symmetric chest wall excursion, clear to auscultation bilaterally, no wheezes or crackles CV:         Regular rate and rhythm; no murmurs, rubs, or gallops.  No pedal edema Abd:      + bowel sounds; soft, non-tender; no distension MSK: no acute synovitis of DIP or PIP joints, no mechanics hands.  Skin:      Warm and dry; no rashes Neuro: normal speech, no focal facial asymmetry Psych: alert and oriented x3, normal mood and affect   Data Reviewed/Medical Decision Making:  Independent interpretation of tests: Imaging: . Review of patient's chest xray April 2021 images revealed no acute cardiopulmonary process. The patient's images have been independently reviewed by me.    PFTs: None on file  Labs:  Lab Results  Component Value Date   WBC 6.4 02/08/2020   HGB 12.3 02/08/2020   HCT 37.4 02/08/2020   MCV 92.6 02/08/2020   PLT 255 02/08/2020   Lab Results  Component Value Date   NA 140 02/08/2020   K 3.7 02/08/2020   CL 108 02/08/2020   CO2 26 02/08/2020     Immunization status:  Immunization History  Administered Date(s) Administered  . DTaP 02/22/1973, 06/28/1973, 10/05/1973, 10/04/1974, 11/28/1978  . IPV 02/22/1973, 06/28/1973, 10/05/1973, 10/04/1974, 11/28/1978  . Influenza Split 08/22/2012, 09/25/2014  . Influenza, Quadrivalent, Recombinant, Inj, Pf 07/18/2020  . Influenza,inj,Quad PF,6+ Mos 07/30/2016, 08/09/2017, 11/06/2018  . Influenza,inj,quad, With Preservative 09/25/2014  . MMR 05/31/1979  . Measles 03/13/1974, 12/01/1987  . PFIZER(Purple Top)SARS-COV-2 Vaccination 01/10/2020, 01/31/2020, 09/22/2020  . PPD Test 01/13/2011  . Rubella 03/13/1974  . Tdap 07/22/2008, 01/08/2019    . I reviewed prior external  note(s) from PCP Dr. Moreen Fowler . I reviewed the result(s) of the labs and imaging as noted above.  . I have ordered PFTs   Assessment:  Chronic Cough Dyspnea  Plan/Recommendations: Suspect combination of GERD with post nasal drainage. Start nightly cetirizine Start taking omeprazole 40 mg BID. GERD lifestyle modifications discussed.  Given her dyspnea will obtain PFTs, although cough variant asthma lower on the differential.   We discussed disease management and progression at length today.    Return to Care: Return in about 2 months (around 03/17/2021).  Lenice Llamas, MD Pulmonary [facility] Office:[phone]  CC: Antony Contras, MD

## 2021-01-15 NOTE — Patient Instructions (Addendum)
The patient should have follow up scheduled with myself in 2 months.   Prior to next visit patient should have: Spirometry/Feno  I think your cough is likely a combination of reflux and post nasal drainage.  Start taking omperazole 40 mg Twice a day before food.  Start taking cetirizine at night time to help with post nasal drainage.    What is GERD? Gastroesophageal reflux disease (GERD) is gastroesophageal reflux diseasewhich occurs when the lower esophageal sphincter (LES) opens spontaneously, for varying periods of time, or does not close properly and stomach contents rise up into the esophagus. GER is also called acid reflux or acid regurgitation, because digestive juices--called acids--rise up with the food. The esophagus is the tube that carries food from the mouth to the stomach. The LES is a ring of muscle at the bottom of the esophagus that acts like a valve between the esophagus and stomach.  When acid reflux occurs, food or fluid can be tasted in the back of the mouth. When refluxed stomach acid touches the lining of the esophagus it may cause a burning sensation in the chest or throat called heartburn or acid indigestion. Occasional reflux is common. Persistent reflux that occurs more than twice a week is considered GERD, and it can eventually lead to more serious health problems. People of all ages can have GERD. Studies have shown that GERD may worsen or contribute to asthma, chronic cough, and pulmonary fibrosis.   What are the symptoms of GERD? The main symptom of GERD in adults is frequent heartburn, also called acid indigestion--burning-type pain in the lower part of the mid-chest, behind the breast bone, and in the mid-abdomen.  Not all reflux is acidic in nature, and many patients don't have heart burn at all. Sometimes it feels like a cough (either dry or with mucus), choking sensation, asthma, shortness of breath, waking up at night, frequent throat clearing, or trouble  swallowing.    What causes GERD? The reason some people develop GERD is still unclear. However, research shows that in people with GERD, the LES relaxes while the rest of the esophagus is working. Anatomical abnormalities such as a hiatal hernia may also contribute to GERD. A hiatal hernia occurs when the upper part of the stomach and the LES move above the diaphragm, the muscle wall that separates the stomach from the chest. Normally, the diaphragm helps the LES keep acid from rising up into the esophagus. When a hiatal hernia is present, acid reflux can occur more easily. A hiatal hernia can occur in people of any age and is most often a normal finding in otherwise healthy people over age 69. Most of the time, a hiatal hernia produces no symptoms.   Other factors that may contribute to GERD include - Obesity or recent weight gain - Pregnancy  - Smoking  - Diet - Certain medications  Common foods that can worsen reflux symptoms include: - carbonated beverages - artificial sweeteners - citrus fruits  - chocolate  - drinks with caffeine or alcohol  - fatty and fried foods  - garlic and onions  - mint flavorings  - spicy foods  - tomato-based foods, like spaghetti sauce, salsa, chili, and pizza   Lifestyle Changes If you smoke, stop.  Avoid foods and beverages that worsen symptoms (see above.) Lose weight if needed.  Eat small, frequent meals.  Wear loose-fitting clothes.  Avoid lying down for 3 hours after a meal.  Raise the head of your bed 6 to  8 inches by securing wood blocks under the bedposts. Just using extra pillows will not help, but using a wedge-shaped pillow may be helpful.  Medications  H2 blockers, such as cimetidine (Tagamet HB), famotidine (Pepcid AC), nizatidine (Axid AR), and ranitidine (Zantac 75), decrease acid production. They are available in prescription strength and over-the-counter strength. These drugs provide short-term relief and are effective for about  half of those who have GERD symptoms.  Proton pump inhibitors include omeprazole (Prilosec, Zegerid), lansoprazole (Prevacid), pantoprazole (Protonix), rabeprazole (Aciphex), and esomeprazole (Nexium), which are available by prescription. Prilosec is also available in over-the-counter strength. Proton pump inhibitors are more effective than H2 blockers and can relieve symptoms and heal the esophageal lining in almost everyone who has GERD.  Because drugs work in different ways, combinations of medications may help control symptoms. People who get heartburn after eating may take both antacids and H2 blockers. The antacids work first to neutralize the acid in the stomach, and then the H2 blockers act on acid production. By the time the antacid stops working, the H2 blocker will have stopped acid production. Your health care provider is the best source of information about how to use medications for GERD.   Points to Remember 1. You can have GERD without having heartburn. Your symptoms could include a dry cough, asthma symptoms, or trouble swallowing.  2. Taking medications daily as prescribed is important in controlling you symptoms.  Sometimes it can take up to 8 weeks to fully achieve the effects of the medications prescribed.  3. Coughing related to GERD can be difficult to treat and is very frustrating!  However, it is important to stick with these medications and lifestyle modifications before pursuing more aggressive or invasive test and treatments.

## 2021-01-26 DIAGNOSIS — Z0000 Encounter for general adult medical examination without abnormal findings: Secondary | ICD-10-CM | POA: Diagnosis not present

## 2021-01-26 DIAGNOSIS — M797 Fibromyalgia: Secondary | ICD-10-CM | POA: Diagnosis not present

## 2021-01-26 DIAGNOSIS — E559 Vitamin D deficiency, unspecified: Secondary | ICD-10-CM | POA: Diagnosis not present

## 2021-01-26 DIAGNOSIS — I10 Essential (primary) hypertension: Secondary | ICD-10-CM | POA: Diagnosis not present

## 2021-01-26 DIAGNOSIS — C50912 Malignant neoplasm of unspecified site of left female breast: Secondary | ICD-10-CM | POA: Diagnosis not present

## 2021-01-26 DIAGNOSIS — M545 Low back pain, unspecified: Secondary | ICD-10-CM | POA: Diagnosis not present

## 2021-02-04 NOTE — Progress Notes (Signed)
[facility]   [phone] Fax:(336) [phone]   Clinic Follow up Note   Patient Care Team: Harlan Stains, MD as PCP - General (Family Medicine) [name], MD as Consulting Physician (General Surgery) Richmond Campbell, MD as Consulting Physician (Gastroenterology) Crissie Reese, MD as Consulting Physician (Plastic Surgery) Michael Boston, MD as Consulting Physician (Colon and Rectal Surgery)  Date of Service:  02/06/2021  CHIEF COMPLAINT: F/u of left breast cancer  SUMMARY OF ONCOLOGIC HISTORY: Oncology History  Malignant neoplasm of female breast ([place])  08/02/2003 Initial Diagnosis   MALIGNANT NEOPLASM OF BREAST UNSPECIFIED SITE. High-grade DCIS of Left breast; Tumor was 4.0 cm. Sentinel lymph nodes were negative.  ER, 24%; PR 2%.  Patient had saline implant.    08/02/2003 Surgery   L mastectomy with reconstruction [name]   05/07/2010 Surgery   R port-a-cath placement   05/11/2010 Relapse/Recurrence   Invasive ductal carcinoma of L breast; ER: 99%, positive; PR: 53%, positive; Ki 67 (mib-1): 20%; hER@ neu by FISH without ampliication; ratio of her 2 CEP 17 was 1.17.    05/28/2010 Surgery   Local excision of the L invasive ductal carcinoma by [name].   06/08/2010 - 08/10/2010 Chemotherapy   Completed 4 cycles of adjuvant chemotherapy with cytoxan and taxotere in combination with neulasta    09/08/2010 - 11/04/2010 Radiation Therapy   XRT was given to the left breast and chall wall consisting of 4780 in 26 fractions with an 1800 cGy boost in 9 fractions Dr Valere Dross.   11/04/2010 - 04/17/2011 Chemotherapy   Tamoxifen taken sporadically and discontinued due to vaginal bleeding.    08/30/2011 Surgery   Laparoscopic assisted vaginal hysterectomy.   04/10/2012 Imaging   MRI of the brain normal.  No cause of the patient's headache identified.    12/17/2012 Imaging   CT scan of abdomen and pelvis showed no acute findings in the abdomen or pelvis.   There were felt to be hepatic hemangiomas present, mesuring 2.5 cm in the superior right heaptic lobe at the hepatic dome and a 7 mm lesion located anteriorly.    08/23/2013 Imaging   Digital diagnostic unilateral right mammogram showed no mamographic or sonographic evidence of malignancy, right breast.      09/11/2013 -  Anti-estrogen oral therapy   Tamoxifen 20 mg once daily, planning for 10 years.   11/26/2016 Imaging   MR Cervical Spine W WO Contrast IMPRESSION: Small left sided disc protrusion at C5-6 extending into the foramen. This could be a source of radiculopathy involving the left C6 nerve root. Diffusely abnormal bone marrow likely related to prior chemotherapy or anemia.   03/17/2017 Imaging   US Abdomen IMPRESSION: No acute or focal abnormality identified.    03/17/2017 Imaging   US Abdomen Complete  IMPRESSION: No acute or focal abnormality identified.   03/23/2018 Imaging   03/23/2018 Mammogram IMPRESSION: No evidence of malignancy.   09/12/2018 Surgery   She underwent breast reconstruction on 09/12/2018 with Dr Harlow Mares      CURRENT THERAPY:  Tamoxifen started in 11/2009, held 04/2011-08/2013, and restarted on 09/11/2013  INTERVAL HISTORY:  Victoria Delacruz is here for a follow up of left breast cancer. She was last seen by me 1 year ago. She presents to the clinic alone. She notes she has across her lower back pain since 12/2020. She was recently seen by her PCP end of March. She notes this pain has not changed. She notes heating pads helps but does not improve  pain. Her pain is mostly persistent on her right lower back and exacerbated when walking. She notes her fibromyalgia pain is all over, but mostly in joints. She notes she is tolerating Tamoxifen with manageable hot flashes.  She notes her mother in law passed in December 2021 from her cancer. She plans to travel with her husband to [place] in May.      REVIEW OF SYSTEMS:   Constitutional: Denies  fevers, chills or abnormal weight loss (+) Tolerable hot flashes  Eyes: Denies blurriness of vision Ears, nose, mouth, throat, and face: Denies mucositis or sore throat Respiratory: Denies cough, dyspnea or wheezes Cardiovascular: Denies palpitation, chest discomfort or lower extremity swelling Gastrointestinal:  Denies nausea, heartburn or change in bowel habits Skin: Denies abnormal skin rashes MSK: (+) Right lower back pain radiating across lower back  Lymphatics: Denies new lymphadenopathy or easy bruising Neurological:Denies numbness, tingling or new weaknesses Behavioral/Psych: Mood is stable, no new changes  All other systems were reviewed with the patient and are negative.  MEDICAL HISTORY:  Past Medical History:  Diagnosis Date  . Anemia   . Arthritis   . Blood transfusion 05/17/11, 05/25/11   anemia  . Breast cancer ([employer]) 2004 and 2011  . Depression   . Dizziness   . ECTOPIC PREGNANCY 1997 and 2011   x 2  . Family history of breast cancer    grandmother  . Fibroid   . Fibromyalgia   . Hemorrhoids 2020  . Hypertension   . Personal history of radiation therapy 2011    SURGICAL HISTORY: Past Surgical History:  Procedure Laterality Date  . ABDOMINAL HYSTERECTOMY    . AUGMENTATION MAMMAPLASTY Left   . BREAST BIOPSY Left 07/05/2003   malignant  . BREAST BIOPSY Left 2011   malignant  . EVALUATION UNDER ANESTHESIA WITH HEMORRHOIDECTOMY N/A 05/17/2019   Procedure: HEMORRHOIDECTOMY, HEMORRHOID LIGATION/PEXY, ANORECTAL EXAM UNDER ANESTHESIA., REMOVAL ANAL CRYPT POLYP;  Surgeon: Michael Boston, MD;  Location: [facility];  Service: General;  Laterality: N/A;  . LAPAROSCOPIC ASSISTED VAGINAL HYSTERECTOMY  08/30/2011   Procedure: LAPAROSCOPIC ASSISTED VAGINAL HYSTERECTOMY;  Surgeon: Osborne Oman, MD;  Location: [facility];  Service: Gynecology;  Laterality: N/A;  . LAPAROSCOPY FOR ECTOPIC PREGNANCY    . LAPAROSCOPY W/ MINI-LAPAROTOMY    . MASTECTOMY Left 2004    complete with reconstruction x 3, no b/p punctures to left arm  . RECONSTRUCTION BREAST IMMEDIATE / DELAYED W/ TISSUE EXPANDER  2004  . REDUCTION MAMMAPLASTY Right     I have reviewed the social history and family history with the patient and they are unchanged from previous note.  ALLERGIES:  has No Known Allergies.  MEDICATIONS:  Current Outpatient Medications  Medication Sig Dispense Refill  . amLODipine (NORVASC) 5 MG tablet Take 1 tablet (5 mg total) by mouth daily. 90 tablet 3  . aspirin-acetaminophen-caffeine (EXCEDRIN MIGRAINE) 287-681-15 MG per tablet Take 1 tablet by mouth every 6 (six) hours as needed for headache.    . Cetirizine HCl 10 MG CAPS Take 1 capsule (10 mg total) by mouth at bedtime. 30 capsule 5  . cholecalciferol (VITAMIN D) 1000 units tablet Take 5,000 Units by mouth daily.    . cyclobenzaprine (FLEXERIL) 10 MG tablet Take 1 tablet (10 mg total) by mouth at bedtime. (Patient taking differently: Take 10 mg by mouth at bedtime as needed.) 20 tablet 0  . LORazepam (ATIVAN) 0.5 MG tablet     . naproxen (NAPROSYN) 500 MG tablet     .  pregabalin (LYRICA) 25 MG capsule Take 25-50 mg by mouth See admin instructions. Taking 1 cap in the morning, and 2 caps in the evening    . propranolol (INDERAL) 20 MG tablet     . SUMAtriptan (IMITREX) 100 MG tablet Take 1 tablet at earliest onset of headache.  May repeat once in 2 hours if headache persists or recurs. (Patient not taking: Reported on 01/15/2021) 10 tablet 2  . tamoxifen (NOLVADEX) 20 MG tablet Take 1 tablet (20 mg total) by mouth daily. 90 tablet 3  . topiramate (TOPAMAX) 50 MG tablet Take 1 tablet (50 mg total) by mouth at bedtime. 30 tablet 2   No current facility-administered medications for this visit.    PHYSICAL EXAMINATION: ECOG PERFORMANCE STATUS: 1 - Symptomatic but completely ambulatory  Vitals:   02/06/21 1111  BP: (!) 130/92  Pulse: 66  Resp: 19  Temp: 97.6 F (36.4 C)  SpO2: 100%   Filed  Weights   02/06/21 1111  Weight: 175 lb 1.6 oz (79.4 kg)    GENERAL:alert, no distress and comfortable SKIN: skin color, texture, turgor are normal, no rashes or significant lesions EYES: normal, Conjunctiva are pink and non-injected, sclera clear  NECK: supple, thyroid normal size, non-tender, without nodularity LYMPH:  no palpable lymphadenopathy in the cervical, axillary  LUNGS: clear to auscultation and percussion with normal breathing effort HEART: regular rate & rhythm and no murmurs and no lower extremity edema ABDOMEN:abdomen soft, non-tender and normal bowel sounds Musculoskeletal:no cyanosis of digits and no clubbing (+) Tenderness of lower right and mid left back NEURO: alert & oriented x 3 with fluent speech, no focal motor/sensory deficits BREAST: S/p left mastectomy: surgical incision healed well. No palpable mass, nodules or adenopathy bilaterally. Breast exam benign.   LABORATORY DATA:  I have reviewed the data as listed CBC Latest Ref Rng & Units 02/06/2021 02/08/2020 04/12/2019  WBC 4.0 - 10.5 K/uL 6.4 6.4 6.1  Hemoglobin 12.0 - 15.0 g/dL 12.6 12.3 13.2  Hematocrit 36.0 - 46.0 % 37.5 37.4 39.1  Platelets 150 - 400 K/uL 236 255 251     CMP Latest Ref Rng & Units 02/06/2021 02/08/2020 10/31/2019  Glucose 70 - 99 mg/dL 109(H) 119(H) -  BUN 6 - 20 mg/dL 8 11 -  Creatinine 0.44 - 1.00 mg/dL 0.90 0.88 0.80  Sodium 135 - 145 mmol/L 142 140 -  Potassium 3.5 - 5.1 mmol/L 3.6 3.7 -  Chloride 98 - 111 mmol/L 105 108 -  CO2 22 - 32 mmol/L 26 26 -  Calcium 8.9 - 10.3 mg/dL 9.0 9.1 -  Total Protein 6.5 - 8.1 g/dL 7.9 7.7 -  Total Bilirubin 0.3 - 1.2 mg/dL 0.3 0.4 -  Alkaline Phos 38 - 126 U/L 98 90 -  AST 15 - 41 U/L 20 20 -  ALT 0 - 44 U/L 14 13 -      RADIOGRAPHIC STUDIES: I have personally reviewed the radiological images as listed and agreed with the findings in the report. No results found.   ASSESSMENT & PLAN:  Victoria Delacruz is a 50 y.o. female with    1.  Left breast invasive duct carcinoma (ER+,PR+,HER2 negative), [id number], stage Ia diagnosed in 2011, with prior left DCIS in 2004 -She wasinitiallydiagnosed with left breast DCIS in 2004. She was treated with Left mastectomy.  -Unfortunately she had left breast cancer recurrence in 05/2010, invasive. She was treated with local excision surgery, adjuvant chemo CT and adjuvant radiation.She underwent breast reconstruction in  09/12/2018 with Dr Harlow Mares. She is satisfied with results.  -She was eligible andpreviouslyreferred to genetics, but did not go.  -She has been taking Tamoxifen since 1/2011held due to vaginal bleeding and restarted in 09/2013.She did have hysterectomy.  -From a breast cancer standpoint, She is clinically doing well. Lab reviewed, her CBC and CMP are within normal limits. Her physical exam and her 01/29/20 right breast mammogram were unremarkable.  -Due to her recent back and right hip pain, will obtain MRI to ruled out bone metastasis. -Continue surveillance. Next mammogram in 1-2 months  -Continue Tamoxifen.  -Lab and f/u in 6 months   2. HTN, Fibromyalgia, chronic body pain, H/o migraines, Recent worsening HA -She follows up with her primary care physician. -Her 04/2012 and 10/30/20 Brain MRI was unremarkable. Takes Excedrin for this pain.   3. Right Lower back/hip pain  -She notes onset of lower right hip pain in 12/2020. She notes this has radiated across her lower back and down right led. This has not improved with heating pad or muscle relaxer. Pain is overall constant but exacerbated by walking. She notes this pain is different from her fibromyalgia.  -I recommend MRI of lumbar spine/hip to further evaluate. If results are benign, she can f/u with orthopedic surgeon or her neurologist. She is agreeable.   4. Vit D Deficiency  -Per patient her Vit D level was low end of April with PCP. She was started on 5000 units Vit D.  -Today's Vit D level is still pending.     Plan -Continue Tamoxifen -MRI of lumbar spine and right hip w wo contrast in 2-3 weeks. I will call her with results  -Mammogram in next month (previously ordered), she will call to schedule  -Lab and f/u in 6 months    No problem-specific Assessment & Plan notes found for this encounter.   Orders Placed This Encounter  Procedures  . MR Lumbar Spine W Wo Contrast    Back and b/l hip pain, R>L, rule out bone mets or other etiology    Standing Status:   Future    Standing Expiration Date:   02/06/2022    Order Specific Question:   If indicated for the ordered procedure, I authorize the administration of contrast media per Radiology protocol    Answer:   Yes    Order Specific Question:   What is the patient's sedation requirement?    Answer:   No Sedation    Order Specific Question:   Does the patient have a pacemaker or implanted devices?    Answer:   No    Order Specific Question:   Use SRS Protocol?    Answer:   No    Order Specific Question:   Preferred imaging location?    Answer:   [facility] (table limit - 550 lbs)  . MR HIP RIGHT W WO CONTRAST    Standing Status:   Future    Standing Expiration Date:   02/06/2022    Order Specific Question:   If indicated for the ordered procedure, I authorize the administration of contrast media per Radiology protocol    Answer:   Yes    Order Specific Question:   What is the patient's sedation requirement?    Answer:   No Sedation    Order Specific Question:   Does the patient have a pacemaker or implanted devices?    Answer:   No    Order Specific Question:   Preferred imaging location?  Answer:   [facility] (table limit - 550 lbs)   All questions were answered. The patient knows to call the clinic with any problems, questions or concerns. No barriers to learning was detected. The total time spent in the appointment was 30 minutes.     Truitt Merle, MD 02/06/2021   I, Joslyn Devon, am acting as scribe for Truitt Merle, MD.   I have reviewed the above documentation for accuracy and completeness, and I agree with the above.

## 2021-02-06 ENCOUNTER — Telehealth: Payer: Self-pay | Admitting: Hematology

## 2021-02-06 ENCOUNTER — Other Ambulatory Visit: Payer: Self-pay

## 2021-02-06 ENCOUNTER — Inpatient Hospital Stay: Payer: Medicare Other | Attending: Hematology | Admitting: Hematology

## 2021-02-06 ENCOUNTER — Inpatient Hospital Stay: Payer: Medicare Other

## 2021-02-06 VITALS — BP 130/92 | HR 66 | Temp 97.6°F | Resp 19 | Ht 63.0 in | Wt 175.1 lb

## 2021-02-06 DIAGNOSIS — Z9012 Acquired absence of left breast and nipple: Secondary | ICD-10-CM | POA: Insufficient documentation

## 2021-02-06 DIAGNOSIS — Z170 Estrogen receptor positive status [ER+]: Secondary | ICD-10-CM | POA: Insufficient documentation

## 2021-02-06 DIAGNOSIS — Z9221 Personal history of antineoplastic chemotherapy: Secondary | ICD-10-CM | POA: Diagnosis not present

## 2021-02-06 DIAGNOSIS — Z79899 Other long term (current) drug therapy: Secondary | ICD-10-CM | POA: Insufficient documentation

## 2021-02-06 DIAGNOSIS — C50412 Malignant neoplasm of upper-outer quadrant of left female breast: Secondary | ICD-10-CM

## 2021-02-06 DIAGNOSIS — Z923 Personal history of irradiation: Secondary | ICD-10-CM | POA: Insufficient documentation

## 2021-02-06 DIAGNOSIS — C50912 Malignant neoplasm of unspecified site of left female breast: Secondary | ICD-10-CM | POA: Diagnosis not present

## 2021-02-06 DIAGNOSIS — M797 Fibromyalgia: Secondary | ICD-10-CM | POA: Insufficient documentation

## 2021-02-06 DIAGNOSIS — I10 Essential (primary) hypertension: Secondary | ICD-10-CM | POA: Diagnosis not present

## 2021-02-06 DIAGNOSIS — E559 Vitamin D deficiency, unspecified: Secondary | ICD-10-CM | POA: Insufficient documentation

## 2021-02-06 DIAGNOSIS — M25551 Pain in right hip: Secondary | ICD-10-CM | POA: Insufficient documentation

## 2021-02-06 LAB — CBC WITH DIFFERENTIAL/PLATELET
Abs Immature Granulocytes: 0.01 10*3/uL (ref 0.00–0.07)
Basophils Absolute: 0.1 10*3/uL (ref 0.0–0.1)
Basophils Relative: 1 %
Eosinophils Absolute: 0.1 10*3/uL (ref 0.0–0.5)
Eosinophils Relative: 1 %
HCT: 37.5 % (ref 36.0–46.0)
Hemoglobin: 12.6 g/dL (ref 12.0–15.0)
Immature Granulocytes: 0 %
Lymphocytes Relative: 40 %
Lymphs Abs: 2.5 10*3/uL (ref 0.7–4.0)
MCH: 30.4 pg (ref 26.0–34.0)
MCHC: 33.6 g/dL (ref 30.0–36.0)
MCV: 90.6 fL (ref 80.0–100.0)
Monocytes Absolute: 0.3 10*3/uL (ref 0.1–1.0)
Monocytes Relative: 5 %
Neutro Abs: 3.4 10*3/uL (ref 1.7–7.7)
Neutrophils Relative %: 53 %
Platelets: 236 10*3/uL (ref 150–400)
RBC: 4.14 MIL/uL (ref 3.87–5.11)
RDW: 13.3 % (ref 11.5–15.5)
WBC: 6.4 10*3/uL (ref 4.0–10.5)
nRBC: 0 % (ref 0.0–0.2)

## 2021-02-06 LAB — COMPREHENSIVE METABOLIC PANEL
ALT: 14 U/L (ref 0–44)
AST: 20 U/L (ref 15–41)
Albumin: 3.9 g/dL (ref 3.5–5.0)
Alkaline Phosphatase: 98 U/L (ref 38–126)
Anion gap: 11 (ref 5–15)
BUN: 8 mg/dL (ref 6–20)
CO2: 26 mmol/L (ref 22–32)
Calcium: 9 mg/dL (ref 8.9–10.3)
Chloride: 105 mmol/L (ref 98–111)
Creatinine, Ser: 0.9 mg/dL (ref 0.44–1.00)
GFR, Estimated: >60 mL/min (ref >60)
Glucose, Bld: 109 mg/dL — ABNORMAL HIGH (ref 70–99)
Potassium: 3.6 mmol/L (ref 3.5–5.1)
Sodium: 142 mmol/L (ref 135–145)
Total Bilirubin: 0.3 mg/dL (ref 0.3–1.2)
Total Protein: 7.9 g/dL (ref 6.5–8.1)

## 2021-02-06 LAB — VITAMIN D 25 HYDROXY (VIT D DEFICIENCY, FRACTURES): Vit D, 25-Hydroxy: 32.17 ng/mL (ref 30–100)

## 2021-02-06 NOTE — Telephone Encounter (Signed)
Scheduled per los. Gave avs and calendar  

## 2021-02-07 ENCOUNTER — Encounter: Payer: Self-pay | Admitting: Hematology

## 2021-02-10 ENCOUNTER — Other Ambulatory Visit: Payer: Self-pay | Admitting: Family Medicine

## 2021-02-10 DIAGNOSIS — Z1231 Encounter for screening mammogram for malignant neoplasm of breast: Secondary | ICD-10-CM

## 2021-02-13 ENCOUNTER — Ambulatory Visit
Admission: RE | Admit: 2021-02-13 | Discharge: 2021-02-13 | Disposition: A | Discharge status: Home / Self Care | Payer: Medicare Other | Source: Ambulatory Visit | Attending: Family Medicine | Admitting: Family Medicine

## 2021-02-13 ENCOUNTER — Other Ambulatory Visit: Payer: Self-pay

## 2021-02-13 DIAGNOSIS — Z1231 Encounter for screening mammogram for malignant neoplasm of breast: Secondary | ICD-10-CM | POA: Diagnosis not present

## 2021-02-18 ENCOUNTER — Telehealth: Payer: Self-pay

## 2021-02-18 NOTE — Telephone Encounter (Signed)
Spoke with pt made aware of most recent labs no concerns encouraged to call for any questions concerns or changes pt states she knows she has MRI no questions concerning scan

## 2021-02-18 NOTE — Telephone Encounter (Signed)
-----   Message from Truitt Merle, MD sent at 02/18/2021  6:58 AM EDT ----- Please let pt know her CBC, CMP and vitd level was normal, no concerns, thanks  Truitt Merle  02/18/2021

## 2021-02-23 ENCOUNTER — Ambulatory Visit (HOSPITAL_COMMUNITY)
Admission: RE | Admit: 2021-02-23 | Discharge: 2021-02-23 | Disposition: A | Discharge status: Home / Self Care | Payer: Medicare Other | Source: Ambulatory Visit | Attending: Hematology | Admitting: Hematology

## 2021-02-23 ENCOUNTER — Other Ambulatory Visit: Payer: Self-pay

## 2021-02-23 DIAGNOSIS — M545 Low back pain, unspecified: Secondary | ICD-10-CM | POA: Diagnosis not present

## 2021-02-23 DIAGNOSIS — M1611 Unilateral primary osteoarthritis, right hip: Secondary | ICD-10-CM | POA: Diagnosis not present

## 2021-02-23 DIAGNOSIS — Z170 Estrogen receptor positive status [ER+]: Secondary | ICD-10-CM

## 2021-02-23 DIAGNOSIS — Z853 Personal history of malignant neoplasm of breast: Secondary | ICD-10-CM | POA: Diagnosis not present

## 2021-02-23 DIAGNOSIS — C50412 Malignant neoplasm of upper-outer quadrant of left female breast: Secondary | ICD-10-CM | POA: Diagnosis not present

## 2021-02-23 DIAGNOSIS — M533 Sacrococcygeal disorders, not elsewhere classified: Secondary | ICD-10-CM | POA: Diagnosis not present

## 2021-02-23 MED ORDER — GADOBUTROL 1 MMOL/ML IV SOLN
7.5000 mL | Freq: Once | INTRAVENOUS | Status: AC | PRN
  Administered 2021-02-23: 7.5 mL via INTRAVENOUS

## 2021-02-24 ENCOUNTER — Telehealth: Payer: Self-pay

## 2021-02-24 NOTE — Telephone Encounter (Signed)
I spoke with Victoria Delacruz to convey her benign MRI results, the patient verbalized understanding. She wanted to know if it was okay to follow up with her orthopedic surgeon. Per Dr. Ernestina Penna last [place] note I did advise the patient to follow up with the orthopedic surgeon since her MRI results were normal.

## 2021-03-10 ENCOUNTER — Other Ambulatory Visit (HOSPITAL_COMMUNITY): Payer: Medicare Other

## 2021-03-10 ENCOUNTER — Other Ambulatory Visit (HOSPITAL_COMMUNITY)
Admission: RE | Admit: 2021-03-10 | Discharge: 2021-03-10 | Disposition: A | Discharge status: Home / Self Care | Payer: Medicare Other | Source: Ambulatory Visit | Attending: Internal Medicine | Admitting: Internal Medicine

## 2021-03-10 DIAGNOSIS — Z20822 Contact with and (suspected) exposure to covid-19: Secondary | ICD-10-CM | POA: Diagnosis not present

## 2021-03-10 DIAGNOSIS — Z01812 Encounter for preprocedural laboratory examination: Secondary | ICD-10-CM | POA: Diagnosis not present

## 2021-03-10 LAB — SARS CORONAVIRUS 2 (TAT 6-24 HRS): SARS Coronavirus 2: NEGATIVE

## 2021-03-11 ENCOUNTER — Other Ambulatory Visit: Payer: Self-pay | Admitting: *Deleted

## 2021-03-11 DIAGNOSIS — R053 Chronic cough: Secondary | ICD-10-CM

## 2021-03-13 ENCOUNTER — Other Ambulatory Visit: Payer: Self-pay

## 2021-03-13 ENCOUNTER — Ambulatory Visit: Payer: Medicare Other | Admitting: Internal Medicine

## 2021-03-13 ENCOUNTER — Ambulatory Visit (INDEPENDENT_AMBULATORY_CARE_PROVIDER_SITE_OTHER): Payer: Medicare Other | Admitting: Internal Medicine

## 2021-03-13 ENCOUNTER — Encounter: Payer: Self-pay | Admitting: Internal Medicine

## 2021-03-13 VITALS — BP 126/82 | HR 65 | Ht 63.0 in | Wt 179.0 lb

## 2021-03-13 DIAGNOSIS — K219 Gastro-esophageal reflux disease without esophagitis: Secondary | ICD-10-CM | POA: Diagnosis not present

## 2021-03-13 DIAGNOSIS — R053 Chronic cough: Secondary | ICD-10-CM | POA: Diagnosis not present

## 2021-03-13 LAB — PULMONARY FUNCTION TEST
FEF 25-75 Pre: 2.45 L/sec
FEF2575-%Pred-Pre: 99 %
FEV1-%Pred-Pre: 105 %
FEV1-Pre: 2.41 L
FEV1FVC-%Pred-Pre: 98 %
FEV6-%Pred-Pre: 108 %
FEV6-Pre: 2.98 L
FEV6FVC-%Pred-Pre: 102 %
FVC-%Pred-Pre: 105 %
FVC-Pre: 2.98 L
Pre FEV1/FVC ratio: 81 %
Pre FEV6/FVC Ratio: 100 %

## 2021-03-13 LAB — POCT EXHALED NITRIC OXIDE: FeNO level (ppb): 21

## 2021-03-13 NOTE — Addendum Note (Signed)
Addended by: Stephanie Coup on: 03/13/2021 10:55 AM   Modules accepted: Orders

## 2021-03-13 NOTE — Progress Notes (Signed)
[name]    [id number]    [birth year]  Primary Care Physician:White, Caren Griffins, MD Date of Appointment: 03/13/2021 Established Patient Visit  Chief complaint:   Chief Complaint  Patient presents with  . Follow-up    PFT and feno performed today.  Pt states she has been doing okay since last visit and states that her cough is gone.     HPI: IRMALEE Delacruz is a 50 y.o. woman with history with chronic cough.  Interval Updates: Here for follow up after PFTs. Feeling better from a coughing standpoint. Denies dyspnea or chest tightness.  She feels the improvement is secondary to twice daily omeprazole. No nausea, vomiting.   I have reviewed the patient's family social and past medical history and updated as appropriate.   Past Medical History:  Diagnosis Date  . Anemia   . Arthritis   . Blood transfusion 05/17/11, 05/25/11   anemia  . Breast cancer ([place]) 2004 and 2011  . Depression   . Dizziness   . ECTOPIC PREGNANCY 1997 and 2011   x 2  . Family history of breast cancer    grandmother  . Fibroid   . Fibromyalgia   . Hemorrhoids 2020  . Hypertension   . Personal history of radiation therapy 2011    Past Surgical History:  Procedure Laterality Date  . ABDOMINAL HYSTERECTOMY    . AUGMENTATION MAMMAPLASTY Left   . BREAST BIOPSY Left 07/05/2003   malignant  . BREAST BIOPSY Left 2011   malignant  . EVALUATION UNDER ANESTHESIA WITH HEMORRHOIDECTOMY N/A 05/17/2019   Procedure: HEMORRHOIDECTOMY, HEMORRHOID LIGATION/PEXY, ANORECTAL EXAM UNDER ANESTHESIA., REMOVAL ANAL CRYPT POLYP;  Surgeon: Michael Boston, MD;  Location: [facility];  Service: General;  Laterality: N/A;  . LAPAROSCOPIC ASSISTED VAGINAL HYSTERECTOMY  08/30/2011   Procedure: LAPAROSCOPIC ASSISTED VAGINAL HYSTERECTOMY;  Surgeon: Osborne Oman, MD;  Location: [facility];  Service: Gynecology;  Laterality: N/A;  . LAPAROSCOPY FOR ECTOPIC PREGNANCY    . LAPAROSCOPY W/  MINI-LAPAROTOMY    . MASTECTOMY Left 2004   complete with reconstruction x 3, no b/p punctures to left arm  . RECONSTRUCTION BREAST IMMEDIATE / DELAYED W/ TISSUE EXPANDER  2004  . REDUCTION MAMMAPLASTY Right     Family History  Problem Relation Age of Onset  . Hypertension Mother   . Chronic bronchitis Mother   . Hypertension Father        History of blood clots in lungs  . COPD Father   . Lymphoma Sister        Hodgkins  . Cancer Sister        lung  . Cancer Maternal Grandmother        breast  . Breast cancer Maternal Grandmother   . Cancer Maternal Grandfather        lung  . Cancer Maternal Aunt        breast   . Pulmonary fibrosis Maternal Aunt   . Cancer Maternal Uncle        prostate/ bone     Social History   Occupational History  . Not on file  Tobacco Use  . Smoking status: Never Smoker  . Smokeless tobacco: Never Used  Substance and Sexual Activity  . Alcohol use: No    Alcohol/week: 0.0 standard drinks  . Drug use: No  . Sexual activity: Yes    Partners: Male    Birth control/protection: None     Physical Exam:  Blood pressure 126/82, pulse 65, height _0  (1.6 m), weight 179 lb (81.2 kg), last menstrual period 04/23/2011, SpO2 99 %.  Gen:      No acute distress Lungs:    No increased respiratory effort, symmetric chest wall excursion, clear to auscultation bilaterally, no wheezes or crackles CV:         Regular rate and rhythm; no murmurs, rubs, or gallops.  No pedal edema   Data Reviewed: Imaging:  PFTs: No flowsheet data found. I have personally reviewed the patient's PFTs and spirometry is normal. Exhaled NO is 21 ppb (not elevated)  Labs:  Immunization status: Immunization History  Administered Date(s) Administered  . DTaP 02/22/1973, 06/28/1973, 10/05/1973, 10/04/1974, 11/28/1978  . IPV 02/22/1973, 06/28/1973, 10/05/1973, 10/04/1974, 11/28/1978  . Influenza Split 08/22/2012, 09/25/2014  . Influenza, Quadrivalent, Recombinant, Inj,  Pf 07/18/2020  . Influenza,inj,Quad PF,6+ Mos 07/30/2016, 08/09/2017, 11/06/2018  . Influenza,inj,quad, With Preservative 09/25/2014  . MMR 05/31/1979  . Measles 03/13/1974, 12/01/1987  . PFIZER(Purple Top)SARS-COV-2 Vaccination 01/10/2020, 01/31/2020, 09/22/2020  . PPD Test 01/13/2011  . Rubella 03/13/1974  . Tdap 07/22/2008, 01/08/2019    Assessment:  Chronic Cough related to GERD  Plan/Recommendations:  Spirometry and FENO less concerning for asthma.  Continue BID PPI.  We discussed lifestyle modifications for GERD - she does not drink etoh, caffeine or carbonated beverages. Feels that diet is probably the biggest culprit of her symptoms. We discussed lifestyle modifications including sleeping with wedge pillow - she has an adjustable bed.   I am happy to see her back as needed - I do advise that she attempt some lifestyle modification as I wouldn't want her to be on the on BID PPI forever. Can consider backing off to once daily in a few months.   Lenice Llamas, MD [facility]

## 2021-03-13 NOTE — Patient Instructions (Signed)
Come see me as needed, if your coughing worsens.

## 2021-03-13 NOTE — Patient Instructions (Signed)
Spirometry/feno performed today.

## 2021-03-13 NOTE — Progress Notes (Signed)
Spirometry/Feno performed today.

## 2021-08-06 ENCOUNTER — Telehealth: Payer: Self-pay | Admitting: Hematology

## 2021-08-06 NOTE — Telephone Encounter (Signed)
Rescheduled upcoming appointment per patient's request. Patient is aware of changes. 

## 2021-08-10 ENCOUNTER — Inpatient Hospital Stay: Payer: Medicare Other

## 2021-08-10 ENCOUNTER — Inpatient Hospital Stay: Payer: Medicare Other | Admitting: Hematology

## 2021-08-12 DIAGNOSIS — K529 Noninfective gastroenteritis and colitis, unspecified: Secondary | ICD-10-CM | POA: Diagnosis not present

## 2021-08-21 ENCOUNTER — Other Ambulatory Visit: Payer: Self-pay

## 2021-08-21 ENCOUNTER — Encounter: Payer: Self-pay | Admitting: Hematology

## 2021-08-21 ENCOUNTER — Inpatient Hospital Stay: Payer: Medicare Other | Admitting: Hematology

## 2021-08-21 ENCOUNTER — Inpatient Hospital Stay: Payer: Medicare Other | Attending: Hematology

## 2021-08-21 VITALS — BP 144/86 | HR 59 | Temp 98.6°F | Resp 17 | Wt 171.1 lb

## 2021-08-21 DIAGNOSIS — Z9012 Acquired absence of left breast and nipple: Secondary | ICD-10-CM | POA: Insufficient documentation

## 2021-08-21 DIAGNOSIS — Z923 Personal history of irradiation: Secondary | ICD-10-CM | POA: Insufficient documentation

## 2021-08-21 DIAGNOSIS — C50412 Malignant neoplasm of upper-outer quadrant of left female breast: Secondary | ICD-10-CM

## 2021-08-21 DIAGNOSIS — Z9221 Personal history of antineoplastic chemotherapy: Secondary | ICD-10-CM | POA: Diagnosis not present

## 2021-08-21 DIAGNOSIS — E559 Vitamin D deficiency, unspecified: Secondary | ICD-10-CM | POA: Insufficient documentation

## 2021-08-21 DIAGNOSIS — Z170 Estrogen receptor positive status [ER+]: Secondary | ICD-10-CM | POA: Diagnosis not present

## 2021-08-21 DIAGNOSIS — Z1231 Encounter for screening mammogram for malignant neoplasm of breast: Secondary | ICD-10-CM | POA: Diagnosis not present

## 2021-08-21 DIAGNOSIS — Z853 Personal history of malignant neoplasm of breast: Secondary | ICD-10-CM | POA: Diagnosis not present

## 2021-08-21 LAB — CBC WITH DIFFERENTIAL/PLATELET
Abs Immature Granulocytes: 0.01 10*3/uL (ref 0.00–0.07)
Basophils Absolute: 0.1 10*3/uL (ref 0.0–0.1)
Basophils Relative: 1 %
Eosinophils Absolute: 0.1 10*3/uL (ref 0.0–0.5)
Eosinophils Relative: 2 %
HCT: 37.2 % (ref 36.0–46.0)
Hemoglobin: 12.8 g/dL (ref 12.0–15.0)
Immature Granulocytes: 0 %
Lymphocytes Relative: 40 %
Lymphs Abs: 2.3 10*3/uL (ref 0.7–4.0)
MCH: 30.2 pg (ref 26.0–34.0)
MCHC: 34.4 g/dL (ref 30.0–36.0)
MCV: 87.7 fL (ref 80.0–100.0)
Monocytes Absolute: 0.2 10*3/uL (ref 0.1–1.0)
Monocytes Relative: 4 %
Neutro Abs: 3.2 10*3/uL (ref 1.7–7.7)
Neutrophils Relative %: 53 %
Platelets: 293 10*3/uL (ref 150–400)
RBC: 4.24 MIL/uL (ref 3.87–5.11)
RDW: 13.2 % (ref 11.5–15.5)
WBC: 5.9 10*3/uL (ref 4.0–10.5)
nRBC: 0 % (ref 0.0–0.2)

## 2021-08-21 LAB — CMP (CANCER CENTER ONLY)
ALT: 17 U/L (ref 0–44)
AST: 22 U/L (ref 15–41)
Albumin: 4.1 g/dL (ref 3.5–5.0)
Alkaline Phosphatase: 86 U/L (ref 38–126)
Anion gap: 6 (ref 5–15)
BUN: 10 mg/dL (ref 6–20)
CO2: 25 mmol/L (ref 22–32)
Calcium: 9.1 mg/dL (ref 8.9–10.3)
Chloride: 109 mmol/L (ref 98–111)
Creatinine: 0.81 mg/dL (ref 0.44–1.00)
GFR, Estimated: >60 mL/min (ref >60)
Glucose, Bld: 112 mg/dL — ABNORMAL HIGH (ref 70–99)
Potassium: 3.4 mmol/L — ABNORMAL LOW (ref 3.5–5.1)
Sodium: 140 mmol/L (ref 135–145)
Total Bilirubin: 0.6 mg/dL (ref 0.3–1.2)
Total Protein: 8.1 g/dL (ref 6.5–8.1)

## 2021-08-21 NOTE — Progress Notes (Signed)
[facility]   Telephone:(336) [phone] Fax:(336) [phone]   Clinic Follow up Note   Patient Care Team: Harlan Stains, MD as PCP - General (Family Medicine) [name], MD as Consulting Physician (General Surgery) Richmond Campbell, MD as Consulting Physician (Gastroenterology) Crissie Reese, MD as Consulting Physician (Plastic Surgery) Michael Boston, MD as Consulting Physician (Colon and Rectal Surgery)  Date of Service:  08/21/2021  CHIEF COMPLAINT: f/u of left breast cancer  CURRENT THERAPY:  Tamoxifen started in 11/2009, held 04/2011-08/2013, and restarted on 09/11/2013   ASSESSMENT & PLAN:  Victoria Delacruz is a 50 y.o. female with   1. Left breast invasive duct carcinoma (ER+,PR+,HER2 negative), [id number], stage Ia diagnosed in 2011, with prior left DCIS in 2004 -She was initially diagnosed with left breast DCIS in 2004. She was treated with Left mastectomy.  -Unfortunately she had left breast cancer recurrence in 05/2010, invasive. She was treated with local excision surgery, adjuvant chemo CT and adjuvant radiation. She underwent breast reconstruction in 09/12/2018 with Dr Harlow Mares. She is satisfied with results.  -She was eligible and previously referred to genetics, but did not go.  -She has been taking Tamoxifen since 11/2009 held due to vaginal bleeding and restarted in 09/2013. She did have hysterectomy.  -last right mammogram 01/2021 was negative. -From a breast cancer standpoint, She is clinically doing well. Lab reviewed, her CBC and CMP are within normal limits. Her physical exam was unremarkable.  -Continue surveillance. Next mammogram in 01/2022 -Continue Tamoxifen until 03/2023.  -Lab and f/u in 6 months    2. HTN, Fibromyalgia, chronic body pain, H/o migraines, Recent worsening HA -She follows up with her primary care physician. -Her 04/2012 and 10/30/20 Brain MRI was unremarkable. Takes Excedrin for this pain.  -she continues to have daily headaches.  I recommend she reach out to her previous neurologist for follow up.   3. Right Lower back/hip pain  -She notes onset of lower right hip pain in 12/2020. She notes this pain is different from her fibromyalgia.  -MRI of lumbar spine/hip on 02/23/21 was negative for metastasis. I recommended she f/u with orthopedics.   4. Vit D Deficiency  -Per patient her Vit D level was low end of April with PCP. She was started on 5000 units Vit D.  -last in 01/2021 was 32.17, which is the low end of normal.     Plan -Continue Tamoxifen -Mammogram in 01/2021 -Lab and f/u in 6 months, per pt request   No problem-specific Assessment & Plan notes found for this encounter.   SUMMARY OF ONCOLOGIC HISTORY: Oncology History  Malignant neoplasm of female breast ([place])  08/02/2003 Initial Diagnosis   MALIGNANT NEOPLASM OF BREAST UNSPECIFIED SITE. High-grade DCIS of Left breast; Tumor was 4.0 cm. Sentinel lymph nodes were negative.  ER, 24%; PR 2%.  Patient had saline implant.    08/02/2003 Surgery   L mastectomy with reconstruction [name]   05/07/2010 Surgery   R port-a-cath placement   05/11/2010 Relapse/Recurrence   Invasive ductal carcinoma of L breast; ER: 99%, positive; PR: 53%, positive; Ki 67 (mib-1): 20%; hER@ neu by FISH without ampliication; ratio of her 2 CEP 17 was 1.17.    05/28/2010 Surgery   Local excision of the L invasive ductal carcinoma by [name].   06/08/2010 - 08/10/2010 Chemotherapy   Completed 4 cycles of adjuvant chemotherapy with cytoxan and taxotere in combination with neulasta    09/08/2010 - 11/04/2010 Radiation Therapy   XRT was given to  the left breast and chall wall consisting of 4780 in 26 fractions with an 1800 cGy boost in 9 fractions Dr Valere Dross.   11/04/2010 - 04/17/2011 Chemotherapy   Tamoxifen taken sporadically and discontinued due to vaginal bleeding.    08/30/2011 Surgery   Laparoscopic assisted vaginal hysterectomy.   04/10/2012 Imaging   MRI of the brain  normal.  No cause of the patient's headache identified.    12/17/2012 Imaging   CT scan of abdomen and pelvis showed no acute findings in the abdomen or pelvis.  There were felt to be hepatic hemangiomas present, mesuring 2.5 cm in the superior right heaptic lobe at the hepatic dome and a 7 mm lesion located anteriorly.    08/23/2013 Imaging   Digital diagnostic unilateral right mammogram showed no mamographic or sonographic evidence of malignancy, right breast.      09/11/2013 -  Anti-estrogen oral therapy   Tamoxifen 20 mg once daily, planning for 10 years.   11/26/2016 Imaging   MR Cervical Spine W WO Contrast IMPRESSION: Small left sided disc protrusion at C5-6 extending into the foramen. This could be a source of radiculopathy involving the left C6 nerve root. Diffusely abnormal bone marrow likely related to prior chemotherapy or anemia.   03/17/2017 Imaging   US Abdomen IMPRESSION: No acute or focal abnormality identified.    03/17/2017 Imaging   US Abdomen Complete  IMPRESSION: No acute or focal abnormality identified.   03/23/2018 Imaging   03/23/2018 Mammogram IMPRESSION: No evidence of malignancy.   09/12/2018 Surgery   She underwent breast reconstruction on 09/12/2018 with Dr Harlow Mares      INTERVAL HISTORY:  Victoria Delacruz is here for a follow up of breast cancer. She was last seen by me on 02/06/21. She presents to the clinic alone. She reports she is tired today because she tried to go back to work. She notes her husband also doesn't work since he had a brain tumor. She reports that she gets a headache at least once a day. She notes the pain will move around her head; today she reports the pain is on the right parietal area, above her ear. She previously saw a neurologist in 2018. We discussed his last note and the recommendations he had listed. She reports some hot flashes, which she is unsure if they are related to tamoxifen or just age. She denies night  sweats. She notes she has also had a few episodes of hematuria. She reports she was evaluated by urology and found no concerns.   All other systems were reviewed with the patient and are negative.  MEDICAL HISTORY:  Past Medical History:  Diagnosis Date   Anemia    Arthritis    Blood transfusion 05/17/11, 05/25/11   anemia   Breast cancer ([place]) 2004 and 2011   Depression    Dizziness    ECTOPIC PREGNANCY 1997 and 2011   x 2   Family history of breast cancer    grandmother   Fibroid    Fibromyalgia    Hemorrhoids 2020   Hypertension    Personal history of radiation therapy 2011    SURGICAL HISTORY: Past Surgical History:  Procedure Laterality Date   ABDOMINAL HYSTERECTOMY     AUGMENTATION MAMMAPLASTY Left    BREAST BIOPSY Left 07/05/2003   malignant   BREAST BIOPSY Left 2011   malignant   EVALUATION UNDER ANESTHESIA WITH HEMORRHOIDECTOMY N/A 05/17/2019   Procedure: HEMORRHOIDECTOMY, HEMORRHOID LIGATION/PEXY, ANORECTAL EXAM UNDER ANESTHESIA., REMOVAL ANAL CRYPT POLYP;  Surgeon: Michael Boston, MD;  Location: [facility];  Service: General;  Laterality: N/A;   LAPAROSCOPIC ASSISTED VAGINAL HYSTERECTOMY  08/30/2011   Procedure: LAPAROSCOPIC ASSISTED VAGINAL HYSTERECTOMY;  Surgeon: Osborne Oman, MD;  Location: [facility];  Service: Gynecology;  Laterality: N/A;   LAPAROSCOPY FOR ECTOPIC PREGNANCY     LAPAROSCOPY W/ MINI-LAPAROTOMY     MASTECTOMY Left 2004   complete with reconstruction x 3, no b/p punctures to left arm   RECONSTRUCTION BREAST IMMEDIATE / DELAYED W/ TISSUE EXPANDER  2004   REDUCTION MAMMAPLASTY Right     I have reviewed the social history and family history with the patient and they are unchanged from previous note.  ALLERGIES:  has No Known Allergies.  MEDICATIONS:  Current Outpatient Medications  Medication Sig Dispense Refill   amLODipine (NORVASC) 5 MG tablet Take 1 tablet (5 mg total) by mouth daily. 90 tablet 3    aspirin-acetaminophen-caffeine (EXCEDRIN MIGRAINE) 921-194-17 MG per tablet Take 1 tablet by mouth every 6 (six) hours as needed for headache.     Cetirizine HCl 10 MG CAPS Take 1 capsule (10 mg total) by mouth at bedtime. 30 capsule 5   cholecalciferol (VITAMIN D) 1000 units tablet Take 5,000 Units by mouth daily.     cyclobenzaprine (FLEXERIL) 10 MG tablet Take 1 tablet (10 mg total) by mouth at bedtime. (Patient taking differently: Take 10 mg by mouth at bedtime as needed.) 20 tablet 0   LORazepam (ATIVAN) 0.5 MG tablet      naproxen (NAPROSYN) 500 MG tablet      pregabalin (LYRICA) 25 MG capsule Take 25-50 mg by mouth See admin instructions. Taking 1 cap in the morning, and 2 caps in the evening (Patient not taking: Reported on 03/13/2021)     propranolol (INDERAL) 20 MG tablet      SUMAtriptan (IMITREX) 100 MG tablet Take 1 tablet at earliest onset of headache.  May repeat once in 2 hours if headache persists or recurs. (Patient taking differently: Take 1 tablet at earliest onset of headache.  May repeat once in 2 hours if headache persists or recurs.) 10 tablet 2   tamoxifen (NOLVADEX) 20 MG tablet Take 1 tablet (20 mg total) by mouth daily. 90 tablet 3   topiramate (TOPAMAX) 50 MG tablet Take 1 tablet (50 mg total) by mouth at bedtime. 30 tablet 2   No current facility-administered medications for this visit.    PHYSICAL EXAMINATION: ECOG PERFORMANCE STATUS: 1 - Symptomatic but completely ambulatory  Vitals:   08/21/21 0915  BP: (!) 144/86  Pulse: (!) 59  Resp: 17  Temp: 98.6 F (37 C)  SpO2: 94%   Wt Readings from Last 3 Encounters:  08/21/21 171 lb 1.6 oz (77.6 kg)  03/13/21 179 lb (81.2 kg)  02/06/21 175 lb 1.6 oz (79.4 kg)     GENERAL:alert, no distress and comfortable SKIN: skin color, texture, turgor are normal, no rashes or significant lesions EYES: normal, Conjunctiva are pink and non-injected, sclera clear  NECK: supple, thyroid normal size, non-tender, without  nodularity LYMPH:  no palpable lymphadenopathy in the cervical, axillary  LUNGS: clear to auscultation and percussion with normal breathing effort HEART: regular rate & rhythm and no murmurs and no lower extremity edema ABDOMEN:abdomen soft, non-tender and normal bowel sounds Musculoskeletal:no cyanosis of digits and no clubbing  NEURO: alert & oriented x 3 with fluent speech, no focal motor/sensory deficits BREAST: No palpable mass, nodules or adenopathy bilaterally. Breast exam benign.  LABORATORY DATA:  I have reviewed the data as listed CBC Latest Ref Rng & Units 08/21/2021 02/06/2021 02/08/2020  WBC 4.0 - 10.5 K/uL 5.9 6.4 6.4  Hemoglobin 12.0 - 15.0 g/dL 12.8 12.6 12.3  Hematocrit 36.0 - 46.0 % 37.2 37.5 37.4  Platelets 150 - 400 K/uL 293 236 255     CMP Latest Ref Rng & Units 08/21/2021 02/06/2021 02/08/2020  Glucose 70 - 99 mg/dL 112(H) 109(H) 119(H)  BUN 6 - 20 mg/dL 10 8 11   Creatinine 0.44 - 1.00 mg/dL 0.81 0.90 0.88  Sodium 135 - 145 mmol/L 140 142 140  Potassium 3.5 - 5.1 mmol/L 3.4(L) 3.6 3.7  Chloride 98 - 111 mmol/L 109 105 108  CO2 22 - 32 mmol/L 25 26 26   Calcium 8.9 - 10.3 mg/dL 9.1 9.0 9.1  Total Protein 6.5 - 8.1 g/dL 8.1 7.9 7.7  Total Bilirubin 0.3 - 1.2 mg/dL 0.6 0.3 0.4  Alkaline Phos 38 - 126 U/L 86 98 90  AST 15 - 41 U/L 22 20 20   ALT 0 - 44 U/L 17 14 13       RADIOGRAPHIC STUDIES: I have personally reviewed the radiological images as listed and agreed with the findings in the report. No results found.    Orders Placed This Encounter  Procedures   MM Digital Screening Unilat R    Standing Status:   Future    Standing Expiration Date:   08/21/2022    Order Specific Question:   Reason for Exam (SYMPTOM  OR DIAGNOSIS REQUIRED)    Answer:   screening    Order Specific Question:   Is the patient pregnant?    Answer:   No    Order Specific Question:   Preferred imaging location?    Answer:   [facility]   All questions were answered. The patient  knows to call the clinic with any problems, questions or concerns. No barriers to learning was detected. The total time spent in the appointment was 25 minutes.     Truitt Merle, MD 08/21/2021   I, Wilburn Mylar, am acting as scribe for Truitt Merle, MD.   I have reviewed the above documentation for accuracy and completeness, and I agree with the above.

## 2021-09-08 DIAGNOSIS — J0100 Acute maxillary sinusitis, unspecified: Secondary | ICD-10-CM | POA: Diagnosis not present

## 2021-09-11 DIAGNOSIS — I10 Essential (primary) hypertension: Secondary | ICD-10-CM | POA: Diagnosis not present

## 2021-09-11 DIAGNOSIS — R1013 Epigastric pain: Secondary | ICD-10-CM | POA: Diagnosis not present

## 2021-09-11 DIAGNOSIS — M797 Fibromyalgia: Secondary | ICD-10-CM | POA: Diagnosis not present

## 2021-10-16 DIAGNOSIS — Z23 Encounter for immunization: Secondary | ICD-10-CM | POA: Diagnosis not present

## 2021-11-04 DIAGNOSIS — E559 Vitamin D deficiency, unspecified: Secondary | ICD-10-CM | POA: Diagnosis not present

## 2021-11-04 DIAGNOSIS — M797 Fibromyalgia: Secondary | ICD-10-CM | POA: Diagnosis not present

## 2021-11-04 DIAGNOSIS — I10 Essential (primary) hypertension: Secondary | ICD-10-CM | POA: Diagnosis not present

## 2021-12-09 ENCOUNTER — Other Ambulatory Visit: Payer: Self-pay | Admitting: Family Medicine

## 2021-12-09 DIAGNOSIS — Z1231 Encounter for screening mammogram for malignant neoplasm of breast: Secondary | ICD-10-CM

## 2021-12-13 ENCOUNTER — Other Ambulatory Visit: Payer: Self-pay

## 2021-12-13 ENCOUNTER — Emergency Department (HOSPITAL_BASED_OUTPATIENT_CLINIC_OR_DEPARTMENT_OTHER)
Admission: EM | Admit: 2021-12-13 | Discharge: 2021-12-13 | Disposition: A | Discharge status: Home / Self Care | Payer: Medicare Other | Attending: Emergency Medicine | Admitting: Emergency Medicine

## 2021-12-13 ENCOUNTER — Emergency Department (HOSPITAL_BASED_OUTPATIENT_CLINIC_OR_DEPARTMENT_OTHER): Payer: Medicare Other

## 2021-12-13 ENCOUNTER — Encounter (HOSPITAL_BASED_OUTPATIENT_CLINIC_OR_DEPARTMENT_OTHER): Payer: Self-pay

## 2021-12-13 DIAGNOSIS — Z79899 Other long term (current) drug therapy: Secondary | ICD-10-CM | POA: Insufficient documentation

## 2021-12-13 DIAGNOSIS — Z853 Personal history of malignant neoplasm of breast: Secondary | ICD-10-CM | POA: Insufficient documentation

## 2021-12-13 DIAGNOSIS — Z7982 Long term (current) use of aspirin: Secondary | ICD-10-CM | POA: Diagnosis not present

## 2021-12-13 DIAGNOSIS — I10 Essential (primary) hypertension: Secondary | ICD-10-CM | POA: Diagnosis not present

## 2021-12-13 DIAGNOSIS — R0602 Shortness of breath: Secondary | ICD-10-CM | POA: Diagnosis not present

## 2021-12-13 DIAGNOSIS — R0789 Other chest pain: Secondary | ICD-10-CM | POA: Diagnosis not present

## 2021-12-13 DIAGNOSIS — R079 Chest pain, unspecified: Secondary | ICD-10-CM | POA: Diagnosis not present

## 2021-12-13 LAB — CBC WITH DIFFERENTIAL/PLATELET
Abs Immature Granulocytes: 0.01 10*3/uL (ref 0.00–0.07)
Basophils Absolute: 0.1 10*3/uL (ref 0.0–0.1)
Basophils Relative: 1 %
Eosinophils Absolute: 0.2 10*3/uL (ref 0.0–0.5)
Eosinophils Relative: 2 %
HCT: 37 % (ref 36.0–46.0)
Hemoglobin: 12.2 g/dL (ref 12.0–15.0)
Immature Granulocytes: 0 %
Lymphocytes Relative: 40 %
Lymphs Abs: 2.7 10*3/uL (ref 0.7–4.0)
MCH: 29.1 pg (ref 26.0–34.0)
MCHC: 33 g/dL (ref 30.0–36.0)
MCV: 88.3 fL (ref 80.0–100.0)
Monocytes Absolute: 0.4 10*3/uL (ref 0.1–1.0)
Monocytes Relative: 5 %
Neutro Abs: 3.4 10*3/uL (ref 1.7–7.7)
Neutrophils Relative %: 52 %
Platelets: 264 10*3/uL (ref 150–400)
RBC: 4.19 MIL/uL (ref 3.87–5.11)
RDW: 13.6 % (ref 11.5–15.5)
WBC: 6.7 10*3/uL (ref 4.0–10.5)
nRBC: 0 % (ref 0.0–0.2)

## 2021-12-13 LAB — TROPONIN I (HIGH SENSITIVITY)
Troponin I (High Sensitivity): 2 ng/L (ref <18)
Troponin I (High Sensitivity): 2 ng/L (ref <18)

## 2021-12-13 LAB — BASIC METABOLIC PANEL
Anion gap: 10 (ref 5–15)
BUN: 14 mg/dL (ref 6–20)
CO2: 25 mmol/L (ref 22–32)
Calcium: 9.3 mg/dL (ref 8.9–10.3)
Chloride: 105 mmol/L (ref 98–111)
Creatinine, Ser: 0.87 mg/dL (ref 0.44–1.00)
GFR, Estimated: >60 mL/min (ref >60)
Glucose, Bld: 112 mg/dL — ABNORMAL HIGH (ref 70–99)
Potassium: 3.3 mmol/L — ABNORMAL LOW (ref 3.5–5.1)
Sodium: 140 mmol/L (ref 135–145)

## 2021-12-13 LAB — D-DIMER, QUANTITATIVE: D-Dimer, Quant: 0.32 ug/mL-FEU (ref 0.00–0.50)

## 2021-12-13 MED ORDER — ASPIRIN 81 MG PO CHEW
324.0000 mg | CHEWABLE_TABLET | Freq: Once | ORAL | Status: AC
  Administered 2021-12-13: 324 mg via ORAL
  Filled 2021-12-13: qty 4

## 2021-12-13 MED ORDER — ACETAMINOPHEN 500 MG PO TABS
1000.0000 mg | ORAL_TABLET | Freq: Once | ORAL | Status: AC
  Administered 2021-12-13: 1000 mg via ORAL
  Filled 2021-12-13: qty 2

## 2021-12-13 MED ORDER — NITROGLYCERIN 0.4 MG SL SUBL
0.4000 mg | SUBLINGUAL_TABLET | SUBLINGUAL | Status: DC | PRN
  Administered 2021-12-13: 0.4 mg via SUBLINGUAL
  Filled 2021-12-13: qty 1

## 2021-12-13 MED ORDER — POTASSIUM CHLORIDE CRYS ER 20 MEQ PO TBCR
40.0000 meq | EXTENDED_RELEASE_TABLET | Freq: Once | ORAL | Status: AC
  Administered 2021-12-13: 40 meq via ORAL

## 2021-12-13 NOTE — ED Triage Notes (Signed)
Pt presents to the ED after having sharp chest pains wake her up from her sleep at 5am. States that she also had pain in the left side of her neck and in her left arm. States that the pain lasted a few minutes. Pt reports some SHOB during the episode. No other associated symptoms. Pt A&Ox4 at time of triage. VSS> EKG obtained during triage. MD at bedside

## 2021-12-13 NOTE — ED Provider Notes (Signed)
[facility] Provider Note: Georgena Spurling, MD, FACEP  CSN: [id number] MRN: [id number] ARRIVAL: 12/13/21 at [place]: [place]  Chest Pain   HISTORY OF PRESENT ILLNESS  12/13/21 6:16 AM Victoria Delacruz is a 51 y.o. female who is status post treatment for breast cancer, currently in remission.  She was awakened from sleep about 5 AM today with severe, sharp left upper chest pain that radiated to her left upper arm and left neck.  It was worse with deep breathing and she had some shortness of breath with it.  She had no nausea or diaphoresis with this.  She rated her pain as a 10 out of 10 at its worst and a 5 out of 10 now.  She does have some reproduction of the pain with movement of her left shoulder but also has fibromyalgia and cannot fully separate the two.   Past Medical History:  Diagnosis Date   Anemia    Arthritis    Blood transfusion 05/17/11, 05/25/11   anemia   Breast cancer ([place]) 2004 and 2011   Depression    Dizziness    ECTOPIC PREGNANCY 1997 and 2011   x 2   Family history of breast cancer    grandmother   Fibroid    Fibromyalgia    Hemorrhoids 2020   Hypertension    Personal history of radiation therapy 2011    Past Surgical History:  Procedure Laterality Date   ABDOMINAL HYSTERECTOMY     AUGMENTATION MAMMAPLASTY Left    BREAST BIOPSY Left 07/05/2003   malignant   BREAST BIOPSY Left 2011   malignant   EVALUATION UNDER ANESTHESIA WITH HEMORRHOIDECTOMY N/A 05/17/2019   Procedure: HEMORRHOIDECTOMY, HEMORRHOID LIGATION/PEXY, ANORECTAL EXAM UNDER ANESTHESIA., REMOVAL ANAL CRYPT POLYP;  Surgeon: Michael Boston, MD;  Location: [facility];  Service: General;  Laterality: N/A;   LAPAROSCOPIC ASSISTED VAGINAL HYSTERECTOMY  08/30/2011   Procedure: LAPAROSCOPIC ASSISTED VAGINAL HYSTERECTOMY;  Surgeon: Osborne Oman, MD;  Location: [facility];  Service: Gynecology;  Laterality: N/A;   LAPAROSCOPY FOR ECTOPIC PREGNANCY      LAPAROSCOPY W/ MINI-LAPAROTOMY     MASTECTOMY Left 2004   complete with reconstruction x 3, no b/p punctures to left arm   RECONSTRUCTION BREAST IMMEDIATE / DELAYED W/ TISSUE EXPANDER  2004   REDUCTION MAMMAPLASTY Right     Family History  Problem Relation Age of Onset   Hypertension Mother    Chronic bronchitis Mother    Hypertension Father        History of blood clots in lungs   COPD Father    Lymphoma Sister        Hodgkins   Cancer Sister        lung   Cancer Maternal Grandmother        breast   Breast cancer Maternal Grandmother    Cancer Maternal Grandfather        lung   Cancer Maternal Aunt        breast    Pulmonary fibrosis Maternal Aunt    Cancer Maternal Uncle        prostate/ bone     Social History   Tobacco Use   Smoking status: Never   Smokeless tobacco: Never  Substance Use Topics   Alcohol use: No    Alcohol/week: 0.0 standard drinks   Drug use: No    Prior to Admission medications   Medication Sig Start Date End Date Taking? Authorizing  Provider  amLODipine (NORVASC) 5 MG tablet Take 1 tablet (5 mg total) by mouth daily. 07/12/13   Hilton Sinclair, MD  aspirin-acetaminophen-caffeine (EXCEDRIN MIGRAINE) [phone] MG per tablet Take 1 tablet by mouth every 6 (six) hours as needed for headache.    [provider]  Cetirizine HCl 10 MG CAPS Take 1 capsule (10 mg total) by mouth at bedtime. 01/15/21   Spero Geralds, MD  cholecalciferol (VITAMIN D) 1000 units tablet Take 5,000 Units by mouth daily.    [provider]  cyclobenzaprine (FLEXERIL) 10 MG tablet Take 1 tablet (10 mg total) by mouth at bedtime. Patient taking differently: Take 10 mg by mouth at bedtime as needed. 02/20/14   Hilton Sinclair, MD  LORazepam (ATIVAN) 0.5 MG tablet  08/13/19   [provider]  naproxen (NAPROSYN) 500 MG tablet  09/14/19   [provider]  pregabalin (LYRICA) 25 MG capsule Take 25-50 mg by mouth See admin  instructions. Taking 1 cap in the morning, and 2 caps in the evening Patient not taking: Reported on 03/13/2021    [provider]  propranolol (INDERAL) 20 MG tablet  01/16/20   [provider]  SUMAtriptan (IMITREX) 100 MG tablet Take 1 tablet at earliest onset of headache.  May repeat once in 2 hours if headache persists or recurs. Patient taking differently: Take 1 tablet at earliest onset of headache.  May repeat once in 2 hours if headache persists or recurs. 02/11/17   Pieter Partridge, DO  tamoxifen (NOLVADEX) 20 MG tablet Take 1 tablet (20 mg total) by mouth daily. 04/12/19   Truitt Merle, MD  topiramate (TOPAMAX) 50 MG tablet Take 1 tablet (50 mg total) by mouth at bedtime. 02/15/17   Pieter Partridge, DO    Allergies Citalopram hydrobromide, Duloxetine hcl, Fluoxetine, Pregabalin, and Venlafaxine   REVIEW OF SYSTEMS  Negative except as noted here or in the History of Present Illness.   PHYSICAL EXAMINATION  Initial Vital Signs Blood pressure (!) 131/103, pulse 61, temperature 97.6 F (36.4 C), temperature source Oral, resp. rate 18, height 5\' 3"  (1.6 m), weight 76.7 kg, last menstrual period 04/23/2011, SpO2 100 %.  Examination General: Well-developed, well-nourished female in no acute distress; appearance consistent with age of record HENT: normocephalic; atraumatic Eyes: Normal appearance Neck: supple Heart: regular rate and rhythm; no murmur Lungs: clear to auscultation bilaterally Abdomen: soft; nondistended; nontender; bowel sounds present Extremities: No deformity; full range of motion; pulses normal; some reproduction of the patient's pain with movement of the left shoulder Neurologic: Awake, alert and oriented; motor function intact in all extremities and symmetric; no facial droop Skin: Warm and dry Psychiatric: Appears anxious   RESULTS  Summary of this visit's results, reviewed and interpreted by myself:   EKG Interpretation  Date/Time:  Sunday  December 13 2021 06:08:16 EST Ventricular Rate:  64 PR Interval:  159 QRS Duration: 82 QT Interval:  455 QTC Calculation: 470 R Axis:   47 Text Interpretation: Sinus rhythm No significant change was found Confirmed by Jurrell Royster [phone]) on 12/13/2021 6:16:11 AM       Laboratory Studies: Results for orders placed or performed during the hospital encounter of 12/13/21 (from the past 24 hour(s))  CBC with Differential/Platelet     Status: None   Collection Time: 12/13/21  6:30 AM  Result Value Ref Range   WBC 6.7 4.0 - 10.5 K/uL   RBC 4.19 3.87 - 5.11 MIL/uL   Hemoglobin 12.2 12.0 -  15.0 g/dL   HCT 37.0 36.0 - 46.0 %   MCV 88.3 80.0 - 100.0 fL   MCH 29.1 26.0 - 34.0 pg   MCHC 33.0 30.0 - 36.0 g/dL   RDW 13.6 11.5 - 15.5 %   Platelets 264 150 - 400 K/uL   nRBC 0.0 0.0 - 0.2 %   Neutrophils Relative % 52 %   Neutro Abs 3.4 1.7 - 7.7 K/uL   Lymphocytes Relative 40 %   Lymphs Abs 2.7 0.7 - 4.0 K/uL   Monocytes Relative 5 %   Monocytes Absolute 0.4 0.1 - 1.0 K/uL   Eosinophils Relative 2 %   Eosinophils Absolute 0.2 0.0 - 0.5 K/uL   Basophils Relative 1 %   Basophils Absolute 0.1 0.0 - 0.1 K/uL   Immature Granulocytes 0 %   Abs Immature Granulocytes 0.01 0.00 - 0.07 K/uL  Basic metabolic panel     Status: Abnormal   Collection Time: 12/13/21  6:30 AM  Result Value Ref Range   Sodium 140 135 - 145 mmol/L   Potassium 3.3 (L) 3.5 - 5.1 mmol/L   Chloride 105 98 - 111 mmol/L   CO2 25 22 - 32 mmol/L   Glucose, Bld 112 (H) 70 - 99 mg/dL   BUN 14 6 - 20 mg/dL   Creatinine, Ser 0.87 0.44 - 1.00 mg/dL   Calcium 9.3 8.9 - 10.3 mg/dL   GFR, Estimated >60 >60 mL/min   Anion gap 10 5 - 15  Troponin I (High Sensitivity)     Status: None   Collection Time: 12/13/21  6:30 AM  Result Value Ref Range   Troponin I (High Sensitivity) 2 <18 ng/L  D-dimer, quantitative     Status: None   Collection Time: 12/13/21  6:30 AM  Result Value Ref Range   D-Dimer, Quant 0.32 0.00 - 0.50  ug/mL-FEU  Troponin I (High Sensitivity)     Status: None   Collection Time: 12/13/21  9:00 AM  Result Value Ref Range   Troponin I (High Sensitivity) 2 <18 ng/L   Imaging Studies: DG Chest Portable 1 View  Result Date: 12/13/2021 CLINICAL DATA:  Chest pain.  History of breast cancer. EXAM: PORTABLE CHEST 1 VIEW COMPARISON:  PA Lat 02/11/2020 FINDINGS: The heart size and mediastinal contours are within normal limits. Both lungs are clear. The visualized skeletal structures are unremarkable. Evidence of prior left mastectomy. IMPRESSION: No active disease. Electronically Signed   By: Telford Nab M.D.   On: 12/13/2021 06:43    ED COURSE and MDM  Nursing notes, initial and subsequent vitals signs, including pulse oximetry, reviewed and interpreted by myself.  Vitals:   12/13/21 0915 12/13/21 0930 12/13/21 1000 12/13/21 1039  BP:  113/86 115/86   Pulse: 66  62   Resp: 15 11 14    Temp:    98 F (36.7 C)  TempSrc:    Oral  SpO2: 97%  100%   Weight:      Height:       Medications  nitroGLYCERIN (NITROSTAT) SL tablet 0.4 mg (0.4 mg Sublingual Given 12/13/21 0622)  acetaminophen (TYLENOL) tablet 1,000 mg (has no administration in time range)  aspirin chewable tablet 324 mg (324 mg Oral Given 12/13/21 0622)   6:39 AM Patient now pain-free after sublingual nitroglycerin.  Relief with nitroglycerin is suggestive of cardiac etiology but nitroglycerin can also relieve esophageal spasm.  She is now complaining of a headache; Tylenol ordered  7:00 AM Signed out to Dr.  Goldston. Second troponin pending.  D-dimer within normal limits, doubt thromboembolic disease.  PROCEDURES  Procedures   ED DIAGNOSES     ICD-10-CM   1. Nonspecific chest pain  R07.9          Asif Muchow, Jenny Reichmann, MD 12/14/21 [phone]

## 2021-12-13 NOTE — ED Notes (Signed)
Dc instructions reviewed with patient. Patient voiced understanding. Dc with belongings.  °

## 2021-12-13 NOTE — ED Provider Notes (Signed)
7:16 AM Care transferred to me. Patient feels well currently.  D-dimer and initial troponin are negative.  Symptoms started at 5 am. Will get 2nd trop.  10:28 AM 2nd trop negative. She feels well.  Low suspicion for ACS, PE, dissection.  Will discharge with return precautions.   Sherwood Gambler, MD 12/13/21 1028

## 2021-12-13 NOTE — Discharge Instructions (Signed)
If you develop recurrent, continued, or worsening chest pain, shortness of breath, fever, vomiting, abdominal or back pain, or any other new/concerning symptoms then return to the ER for evaluation.  

## 2021-12-31 DIAGNOSIS — M797 Fibromyalgia: Secondary | ICD-10-CM | POA: Diagnosis not present

## 2021-12-31 DIAGNOSIS — E559 Vitamin D deficiency, unspecified: Secondary | ICD-10-CM | POA: Diagnosis not present

## 2021-12-31 DIAGNOSIS — I10 Essential (primary) hypertension: Secondary | ICD-10-CM | POA: Diagnosis not present

## 2021-12-31 DIAGNOSIS — C50912 Malignant neoplasm of unspecified site of left female breast: Secondary | ICD-10-CM | POA: Diagnosis not present

## 2022-01-06 ENCOUNTER — Other Ambulatory Visit: Payer: Self-pay | Admitting: Family Medicine

## 2022-01-06 DIAGNOSIS — R102 Pelvic and perineal pain: Secondary | ICD-10-CM

## 2022-01-07 ENCOUNTER — Ambulatory Visit
Admission: RE | Admit: 2022-01-07 | Discharge: 2022-01-07 | Disposition: A | Discharge status: Home / Self Care | Payer: Medicare Other | Source: Ambulatory Visit | Attending: Family Medicine | Admitting: Family Medicine

## 2022-01-07 DIAGNOSIS — Z853 Personal history of malignant neoplasm of breast: Secondary | ICD-10-CM | POA: Diagnosis not present

## 2022-01-07 DIAGNOSIS — R102 Pelvic and perineal pain: Secondary | ICD-10-CM

## 2022-02-03 DIAGNOSIS — C50912 Malignant neoplasm of unspecified site of left female breast: Secondary | ICD-10-CM | POA: Diagnosis not present

## 2022-02-03 DIAGNOSIS — Z0000 Encounter for general adult medical examination without abnormal findings: Secondary | ICD-10-CM | POA: Diagnosis not present

## 2022-02-03 DIAGNOSIS — M797 Fibromyalgia: Secondary | ICD-10-CM | POA: Diagnosis not present

## 2022-02-03 DIAGNOSIS — I10 Essential (primary) hypertension: Secondary | ICD-10-CM | POA: Diagnosis not present

## 2022-02-03 DIAGNOSIS — M25512 Pain in left shoulder: Secondary | ICD-10-CM | POA: Diagnosis not present

## 2022-02-10 ENCOUNTER — Telehealth: Payer: Self-pay | Admitting: Hematology

## 2022-02-10 NOTE — Telephone Encounter (Signed)
Rescheduled upcoming appointment due to provider's PAL. Patient is aware of changes. ?

## 2022-02-15 ENCOUNTER — Ambulatory Visit
Admission: RE | Admit: 2022-02-15 | Discharge: 2022-02-15 | Disposition: A | Discharge status: Home / Self Care | Payer: Medicare Other | Source: Ambulatory Visit | Attending: Family Medicine | Admitting: Family Medicine

## 2022-02-15 DIAGNOSIS — Z1231 Encounter for screening mammogram for malignant neoplasm of breast: Secondary | ICD-10-CM | POA: Diagnosis not present

## 2022-02-22 ENCOUNTER — Inpatient Hospital Stay: Payer: Medicare Other

## 2022-02-22 ENCOUNTER — Inpatient Hospital Stay: Payer: Medicare Other | Admitting: Hematology

## 2022-03-02 ENCOUNTER — Inpatient Hospital Stay: Payer: Medicare Other | Admitting: Hematology

## 2022-03-02 ENCOUNTER — Encounter: Payer: Self-pay | Admitting: Hematology

## 2022-03-02 ENCOUNTER — Other Ambulatory Visit: Payer: Self-pay

## 2022-03-02 ENCOUNTER — Inpatient Hospital Stay: Payer: Medicare Other | Attending: Hematology

## 2022-03-02 VITALS — BP 128/91 | HR 70 | Temp 98.5°F | Resp 18 | Ht 63.0 in | Wt 164.4 lb

## 2022-03-02 DIAGNOSIS — Z79810 Long term (current) use of selective estrogen receptor modulators (SERMs): Secondary | ICD-10-CM | POA: Insufficient documentation

## 2022-03-02 DIAGNOSIS — Z79899 Other long term (current) drug therapy: Secondary | ICD-10-CM | POA: Insufficient documentation

## 2022-03-02 DIAGNOSIS — Z9012 Acquired absence of left breast and nipple: Secondary | ICD-10-CM | POA: Insufficient documentation

## 2022-03-02 DIAGNOSIS — C50912 Malignant neoplasm of unspecified site of left female breast: Secondary | ICD-10-CM | POA: Insufficient documentation

## 2022-03-02 DIAGNOSIS — C50412 Malignant neoplasm of upper-outer quadrant of left female breast: Secondary | ICD-10-CM | POA: Diagnosis not present

## 2022-03-02 DIAGNOSIS — Z923 Personal history of irradiation: Secondary | ICD-10-CM | POA: Insufficient documentation

## 2022-03-02 DIAGNOSIS — M797 Fibromyalgia: Secondary | ICD-10-CM | POA: Diagnosis not present

## 2022-03-02 DIAGNOSIS — Z170 Estrogen receptor positive status [ER+]: Secondary | ICD-10-CM | POA: Diagnosis not present

## 2022-03-02 DIAGNOSIS — F32A Depression, unspecified: Secondary | ICD-10-CM | POA: Diagnosis not present

## 2022-03-02 DIAGNOSIS — Z9221 Personal history of antineoplastic chemotherapy: Secondary | ICD-10-CM | POA: Diagnosis not present

## 2022-03-02 DIAGNOSIS — E559 Vitamin D deficiency, unspecified: Secondary | ICD-10-CM | POA: Insufficient documentation

## 2022-03-02 LAB — CBC WITH DIFFERENTIAL/PLATELET
Abs Immature Granulocytes: 0.01 10*3/uL (ref 0.00–0.07)
Basophils Absolute: 0.1 10*3/uL (ref 0.0–0.1)
Basophils Relative: 1 %
Eosinophils Absolute: 0.1 10*3/uL (ref 0.0–0.5)
Eosinophils Relative: 1 %
HCT: 35.7 % — ABNORMAL LOW (ref 36.0–46.0)
Hemoglobin: 12.6 g/dL (ref 12.0–15.0)
Immature Granulocytes: 0 %
Lymphocytes Relative: 36 %
Lymphs Abs: 2.7 10*3/uL (ref 0.7–4.0)
MCH: 30.9 pg (ref 26.0–34.0)
MCHC: 35.3 g/dL (ref 30.0–36.0)
MCV: 87.5 fL (ref 80.0–100.0)
Monocytes Absolute: 0.2 10*3/uL (ref 0.1–1.0)
Monocytes Relative: 3 %
Neutro Abs: 4.4 10*3/uL (ref 1.7–7.7)
Neutrophils Relative %: 59 %
Platelets: 237 10*3/uL (ref 150–400)
RBC: 4.08 MIL/uL (ref 3.87–5.11)
RDW: 13.2 % (ref 11.5–15.5)
WBC: 7.4 10*3/uL (ref 4.0–10.5)
nRBC: 0 % (ref 0.0–0.2)

## 2022-03-02 LAB — COMPREHENSIVE METABOLIC PANEL
ALT: 12 U/L (ref 0–44)
AST: 23 U/L (ref 15–41)
Albumin: 4 g/dL (ref 3.5–5.0)
Alkaline Phosphatase: 80 U/L (ref 38–126)
Anion gap: 5 (ref 5–15)
BUN: 12 mg/dL (ref 6–20)
CO2: 29 mmol/L (ref 22–32)
Calcium: 9.5 mg/dL (ref 8.9–10.3)
Chloride: 107 mmol/L (ref 98–111)
Creatinine, Ser: 0.97 mg/dL (ref 0.44–1.00)
GFR, Estimated: >60 mL/min (ref >60)
Glucose, Bld: 116 mg/dL — ABNORMAL HIGH (ref 70–99)
Potassium: 3.3 mmol/L — ABNORMAL LOW (ref 3.5–5.1)
Sodium: 141 mmol/L (ref 135–145)
Total Bilirubin: 0.4 mg/dL (ref 0.3–1.2)
Total Protein: 7.5 g/dL (ref 6.5–8.1)

## 2022-03-02 LAB — VITAMIN D 25 HYDROXY (VIT D DEFICIENCY, FRACTURES): Vit D, 25-Hydroxy: 54.99 ng/mL (ref 30–100)

## 2022-03-02 MED ORDER — TAMOXIFEN CITRATE 20 MG PO TABS: 20.0000 mg | ORAL_TABLET | Freq: Every day | ORAL | 3 refills | Status: DC

## 2022-03-02 MED ORDER — POTASSIUM CHLORIDE CRYS ER 10 MEQ PO TBCR: 10.0000 meq | EXTENDED_RELEASE_TABLET | Freq: Two times a day (BID) | ORAL | 1 refills | Status: AC

## 2022-03-02 NOTE — Progress Notes (Signed)
?[facility]   ?[phone] [phone]   ?Clinic Follow up Note  ? ?Patient Care Team: ?Harlan Stains, MD as PCP - General (Family Medicine) ?[name], MD as Consulting Physician (General Surgery) ?Richmond Campbell, MD as Consulting Physician (Gastroenterology) ?Crissie Reese, MD as Consulting Physician (Plastic Surgery) ?Michael Boston, MD as Consulting Physician (Colon and Rectal Surgery) ? ?Date of Service:  03/02/2022 ? ?CHIEF COMPLAINT: f/u of left breast cancer ? ?CURRENT THERAPY:  ?Tamoxifen started in 11/2009, held 04/2011-08/2013, and restarted on 09/11/2013  ? ?ASSESSMENT & PLAN:  ?Victoria Delacruz is a 51 y.o. post-hysterectomy female with  ? ?1. Left breast invasive duct carcinoma (ER+,PR+,HER2 negative), pT1aN0M0, stage Ia diagnosed in 2011, with prior left DCIS in 2004 ?-s/p left mastectomy in 08/2003 for 4 cm high grade DCIS ?-Unfortunately she had left breast cancer recurrence in 05/2010, invasive. She was treated with local excision surgery, adjuvant chemo CT and adjuvant radiation. She underwent breast reconstruction in 09/12/18 with Dr Harlow Mares.  ?-She declined genetics ?-She has been taking Tamoxifen since 2011 held due to vaginal bleeding and restarted in 07/2013. She did have hysterectomy.  ?-last right mammogram 02/15/22 was negative. She declined breast exam today  ?-From a breast cancer standpoint, She is clinically doing well. Lab reviewed, her CBC and CMP are within normal limits. She declined physical exam due to recent mammogram. ?-Continue surveillance. Next mammogram in 01/2023 ?-Continue Tamoxifen until 03/2023.  ?-Lab and f/u in 6 months  ?  ?2. HTN, Fibromyalgia, chronic body pain, H/o migraines, Recent worsening HA ?-She follows up with her primary care physician. ?-Her 04/2012 and 10/30/20 Brain MRI were unremarkable. ?-fibromyalgia managed with Cymbalta, muscle relaxer, ibuprofen.  ?  ?4. Vit D Deficiency  ?-Per patient her Vit D level was low end  of April with PCP. She was started on 5000 units Vit D.  ?-last in 01/2021 was 32.17, which is the low end of normal. Today's result is pending. ?  ?  ?Plan ?-Continue Tamoxifen ?-Lab and f/u in 6 months, per pt request ? ? ?No problem-specific Assessment & Plan notes found for this encounter. ? ? ?SUMMARY OF ONCOLOGIC HISTORY: ?Oncology History  ?Malignant neoplasm of female breast ([place])  ?08/02/2003 Initial Diagnosis  ? MALIGNANT NEOPLASM OF BREAST UNSPECIFIED SITE. High-grade DCIS of Left breast; Tumor was 4.0 cm. Sentinel lymph nodes were negative.  ER, 24%; PR 2%.  Patient had saline implant.  ? ?  ?08/02/2003 Surgery  ? L mastectomy with reconstruction [name] ? ?  ?05/07/2010 Surgery  ? R port-a-cath placement ? ?  ?05/11/2010 Relapse/Recurrence  ? Invasive ductal carcinoma of L breast; ER: 99%, positive; PR: 53%, positive; Ki 67 (mib-1): 20%; hER@ neu by FISH without ampliication; ratio of her 2 CEP 17 was 1.17.  ? ?  ?05/28/2010 Surgery  ? Local excision of the L invasive ductal carcinoma by [name]. ? ?  ?06/08/2010 - 08/10/2010 Chemotherapy  ? Completed 4 cycles of adjuvant chemotherapy with cytoxan and taxotere in combination with neulasta  ? ?  ?09/08/2010 - 11/04/2010 Radiation Therapy  ? XRT was given to the left breast and chall wall consisting of 4780 in 26 fractions with an 1800 cGy boost in 9 fractions Dr Valere Dross. ? ?  ?11/04/2010 - 04/17/2011 Chemotherapy  ? Tamoxifen taken sporadically and discontinued due to vaginal bleeding.  ? ?  ?08/30/2011 Surgery  ? Laparoscopic assisted vaginal hysterectomy. ? ?  ?04/10/2012 Imaging  ? MRI of the brain normal.  No cause of the patient's headache identified.  ? ?  ?12/17/2012 Imaging  ? CT scan of abdomen and pelvis showed no acute findings in the abdomen or pelvis.  There were felt to be hepatic hemangiomas present, mesuring 2.5 cm in the superior right heaptic lobe at the hepatic dome and a 7 mm lesion located anteriorly.  ? ?  ?08/23/2013 Imaging  ? Digital  diagnostic unilateral right mammogram showed no mamographic or sonographic evidence of malignancy, right breast.    ? ?  ?09/11/2013 -  Anti-estrogen oral therapy  ? Tamoxifen 20 mg once daily, planning for 10 years. ? ?  ?11/26/2016 Imaging  ? MR Cervical Spine W WO Contrast ?IMPRESSION: ?Small left sided disc protrusion at C5-6 extending into the foramen. ?This could be a source of radiculopathy involving the left C6 nerve ?root. ?Diffusely abnormal bone marrow likely related to prior chemotherapy ?or anemia. ? ?  ?03/17/2017 Imaging  ? US Abdomen IMPRESSION: No acute or focal abnormality identified. ? ? ?  ?03/17/2017 Imaging  ? US Abdomen Complete  ?IMPRESSION: ?No acute or focal abnormality identified. ? ?  ?03/23/2018 Imaging  ? 03/23/2018 Mammogram ?IMPRESSION: ?No evidence of malignancy. ? ?  ?09/12/2018 Surgery  ? She underwent breast reconstruction on 09/12/2018 with Dr Harlow Mares ?  ? ? ? ?INTERVAL HISTORY:  ?Victoria Delacruz is here for a follow up of breast cancer. She was last seen by me on 08/21/21. She presents to the clinic alone. ?She reports she is doing well overall. She has been seeing her PCP for fibromyalgia, now managed with Cymbalta, muscle relaxer, ibuprofen.  ?She reports she is not taking tamoxifen daily but endorses taking more than half the time. She notes her hot flashes are worse on tamoxifen. ?  ?All other systems were reviewed with the patient and are negative. ? ?MEDICAL HISTORY:  ?Past Medical History:  ?Diagnosis Date  ? Anemia   ? Arthritis   ? Blood transfusion 05/17/11, 05/25/11  ? anemia  ? Breast cancer ([place]) 2004 and 2011  ? Depression   ? Dizziness   ? ECTOPIC PREGNANCY 1997 and 2011  ? x 2  ? Family history of breast cancer   ? grandmother  ? Fibroid   ? Fibromyalgia   ? Hemorrhoids 2020  ? Hypertension   ? Personal history of radiation therapy 2011  ? ? ?SURGICAL HISTORY: ?Past Surgical History:  ?Procedure Laterality Date  ? ABDOMINAL HYSTERECTOMY    ? AUGMENTATION MAMMAPLASTY  Left   ? BREAST BIOPSY Left 07/05/2003  ? malignant  ? BREAST BIOPSY Left 2011  ? malignant  ? EVALUATION UNDER ANESTHESIA WITH HEMORRHOIDECTOMY N/A 05/17/2019  ? Procedure: HEMORRHOIDECTOMY, HEMORRHOID LIGATION/PEXY, ANORECTAL EXAM UNDER ANESTHESIA., REMOVAL ANAL CRYPT POLYP;  Surgeon: Michael Boston, MD;  Location: [facility];  Service: General;  Laterality: N/A;  ? LAPAROSCOPIC ASSISTED VAGINAL HYSTERECTOMY  08/30/2011  ? Procedure: LAPAROSCOPIC ASSISTED VAGINAL HYSTERECTOMY;  Surgeon: Osborne Oman, MD;  Location: [facility];  Service: Gynecology;  Laterality: N/A;  ? LAPAROSCOPY FOR ECTOPIC PREGNANCY    ? LAPAROSCOPY W/ MINI-LAPAROTOMY    ? MASTECTOMY Left 2001  ? complete with reconstruction x 3, no b/p punctures to left arm  ? RECONSTRUCTION BREAST IMMEDIATE / DELAYED W/ TISSUE EXPANDER  2004  ? REDUCTION MAMMAPLASTY Right   ? ? ?I have reviewed the social history and family history with the patient and they are unchanged from previous note. ? ?ALLERGIES:  is allergic to citalopram hydrobromide, duloxetine  hcl, fluoxetine, pregabalin, and venlafaxine. ? ?MEDICATIONS:  ?Current Outpatient Medications  ?Medication Sig Dispense Refill  ? potassium chloride (KLOR-CON M) 10 MEQ tablet Take 1 tablet (10 mEq total) by mouth 2 (two) times daily. 30 tablet 1  ? amLODipine (NORVASC) 5 MG tablet Take 1 tablet (5 mg total) by mouth daily. 90 tablet 3  ? aspirin-acetaminophen-caffeine (EXCEDRIN MIGRAINE) [phone] MG per tablet Take 1 tablet by mouth every 6 (six) hours as needed for headache.    ? Cetirizine HCl 10 MG CAPS Take 1 capsule (10 mg total) by mouth at bedtime. 30 capsule 5  ? cholecalciferol (VITAMIN D) 1000 units tablet Take 5,000 Units by mouth daily.    ? cyclobenzaprine (FLEXERIL) 10 MG tablet Take 1 tablet (10 mg total) by mouth at bedtime. (Patient taking differently: Take 10 mg by mouth at bedtime as needed.) 20 tablet 0  ? LORazepam (ATIVAN) 0.5 MG tablet     ? naproxen (NAPROSYN) 500  MG tablet     ? propranolol (INDERAL) 20 MG tablet     ? SUMAtriptan (IMITREX) 100 MG tablet Take 1 tablet at earliest onset of headache.  May repeat once in 2 hours if headache persists or recurs. (Patie

## 2022-05-07 DIAGNOSIS — I10 Essential (primary) hypertension: Secondary | ICD-10-CM | POA: Diagnosis not present

## 2022-05-07 DIAGNOSIS — M797 Fibromyalgia: Secondary | ICD-10-CM | POA: Diagnosis not present

## 2022-05-07 DIAGNOSIS — K219 Gastro-esophageal reflux disease without esophagitis: Secondary | ICD-10-CM | POA: Diagnosis not present

## 2022-05-28 ENCOUNTER — Other Ambulatory Visit: Payer: Self-pay

## 2022-05-28 DIAGNOSIS — Z170 Estrogen receptor positive status [ER+]: Secondary | ICD-10-CM

## 2022-05-28 MED ORDER — TAMOXIFEN CITRATE 20 MG PO TABS: 20.0000 mg | ORAL_TABLET | Freq: Every day | ORAL | 3 refills | Status: DC

## 2022-06-29 DIAGNOSIS — U071 COVID-19: Secondary | ICD-10-CM | POA: Diagnosis not present

## 2022-08-19 DIAGNOSIS — Z23 Encounter for immunization: Secondary | ICD-10-CM | POA: Diagnosis not present

## 2022-08-19 DIAGNOSIS — M25512 Pain in left shoulder: Secondary | ICD-10-CM | POA: Diagnosis not present

## 2022-08-19 DIAGNOSIS — M5030 Other cervical disc degeneration, unspecified cervical region: Secondary | ICD-10-CM | POA: Diagnosis not present

## 2022-08-31 DIAGNOSIS — M25512 Pain in left shoulder: Secondary | ICD-10-CM | POA: Diagnosis not present

## 2022-09-02 ENCOUNTER — Inpatient Hospital Stay: Payer: Medicare Other | Attending: Hematology

## 2022-09-02 ENCOUNTER — Inpatient Hospital Stay: Payer: Medicare Other | Admitting: Hematology

## 2022-09-02 DIAGNOSIS — R0789 Other chest pain: Secondary | ICD-10-CM | POA: Insufficient documentation

## 2022-09-02 DIAGNOSIS — M797 Fibromyalgia: Secondary | ICD-10-CM | POA: Insufficient documentation

## 2022-09-02 DIAGNOSIS — E559 Vitamin D deficiency, unspecified: Secondary | ICD-10-CM | POA: Insufficient documentation

## 2022-09-02 DIAGNOSIS — I10 Essential (primary) hypertension: Secondary | ICD-10-CM | POA: Insufficient documentation

## 2022-09-02 DIAGNOSIS — Z9012 Acquired absence of left breast and nipple: Secondary | ICD-10-CM | POA: Insufficient documentation

## 2022-09-02 DIAGNOSIS — Z923 Personal history of irradiation: Secondary | ICD-10-CM | POA: Insufficient documentation

## 2022-09-02 DIAGNOSIS — Z170 Estrogen receptor positive status [ER+]: Secondary | ICD-10-CM

## 2022-09-02 DIAGNOSIS — Z79810 Long term (current) use of selective estrogen receptor modulators (SERMs): Secondary | ICD-10-CM | POA: Insufficient documentation

## 2022-09-02 DIAGNOSIS — Z90710 Acquired absence of both cervix and uterus: Secondary | ICD-10-CM | POA: Insufficient documentation

## 2022-09-02 DIAGNOSIS — Z79899 Other long term (current) drug therapy: Secondary | ICD-10-CM | POA: Insufficient documentation

## 2022-09-02 DIAGNOSIS — C50912 Malignant neoplasm of unspecified site of left female breast: Secondary | ICD-10-CM | POA: Insufficient documentation

## 2022-09-16 NOTE — Assessment & Plan Note (Signed)
1. Left breast invasive duct carcinoma (ER+,PR+,HER2 negative), pT1aN0M0, stage Ia diagnosed in 2011, with prior left DCIS in 2004 -s/p left mastectomy in 08/2003 for 4 cm high grade DCIS -Unfortunately she had left breast cancer recurrence in 05/2010, invasive. She was treated with local excision surgery, adjuvant chemo CT and adjuvant radiation. She underwent breast reconstruction in 09/12/18 with Dr Harlow Mares.  -She declined genetics -She has been taking Tamoxifen since 2011 held due to vaginal bleeding and restarted in 07/2013. She did have hysterectomy.

## 2022-09-16 NOTE — Progress Notes (Signed)
[place]   Telephone:([phone]) [phone] [phone]   Clinic Follow up Note   Patient Care Team: Harlan Stains, MD as PCP - General (Family Medicine) [name], MD as Consulting Physician (General Surgery) Richmond Campbell, MD as Consulting Physician (Gastroenterology) Crissie Reese, MD as Consulting Physician (Plastic Surgery) Michael Boston, MD as Consulting Physician (Colon and Rectal Surgery) Truitt Merle, MD as Attending Physician (Hematology and Oncology)  Date of Service:  09/17/2022  CHIEF COMPLAINT: f/u of left breast cancer.  CURRENT THERAPY:  Tamoxifen started in 11/2009, held 04/2011-08/2013, and restarted on 09/11/2013    ASSESSMENT & PLAN:  Victoria Delacruz is a 51 y.o. post hysterectomy female with   1. Left breast invasive duct carcinoma (ER+,PR+,HER2 negative), [id number], stage Ia diagnosed in 2011, with prior left DCIS in 2004 -s/p left mastectomy in 08/2003 for 4 cm high grade DCIS -Unfortunately she had left breast cancer recurrence in 05/2010, invasive. She was treated with local excision surgery, adjuvant chemo CT and adjuvant radiation. She underwent breast reconstruction in 09/12/18 with Dr Harlow Mares.  -She declined genetics -She has been taking Tamoxifen since 2011 held due to vaginal bleeding and restarted in 07/2013. She did have hysterectomy.  -last right mammogram 02/15/22 was negative.  -From a breast cancer standpoint, She is clinically doing well. Lab reviewed, her CBC and CMP are within normal limits.  -We discuss MRI in between mammograms. -Continue surveillance. Next mammogram in 01/2023 -due to hot flushes, she could like to come off Tamoxifen. She agrees to continue Tamoxifen until the end of 2023 -Lab and f/u in 6 months   2. Left breast/chest wall pain -new in the last few months -we discussed obtaining chest x-ray. Plan for next Tuesday, 11/21. -she expressed concern with her left breast not being checked by mammogram. I  discussed the option of annual screening MRI. She is interested   3. HTN, Fibromyalgia, chronic body pain, H/o migraines, Recent worsening HA -She follows up with her primary care physician. -Her 04/2012 and 10/30/20 Brain MRI were unremarkable. -fibromyalgia managed with Cymbalta, muscle relaxer, ibuprofen.    4. Vit D Deficiency  -Per patient her Vit D level was low end of April with PCP. She was started on 5000 units Vit D.  -last in 03/2022 was 54.99. Today results is still pending.     Plan -Continue Tamoxifen -Lab and f/u in 6 months, per pt request -Schedule screening breast MRI in 2-4wks -chest Xray next Tuesday -Next annual screening right breast mammogram in April 2024   SUMMARY OF ONCOLOGIC HISTORY: Oncology History  Malignant neoplasm of female breast ([place])  08/02/2003 Initial Diagnosis   MALIGNANT NEOPLASM OF BREAST UNSPECIFIED SITE. High-grade DCIS of Left breast; Tumor was 4.0 cm. Sentinel lymph nodes were negative.  ER, 24%; PR 2%.  Patient had saline implant.    08/02/2003 Surgery   L mastectomy with reconstruction [name]   05/07/2010 Surgery   R port-a-cath placement   05/11/2010 Relapse/Recurrence   Invasive ductal carcinoma of L breast; ER: 99%, positive; PR: 53%, positive; Ki 67 (mib-1): 20%; hER@ neu by FISH without ampliication; ratio of her 2 CEP 17 was 1.17.    05/28/2010 Surgery   Local excision of the L invasive ductal carcinoma by [name].   06/08/2010 - 08/10/2010 Chemotherapy   Completed 4 cycles of adjuvant chemotherapy with cytoxan and taxotere in combination with neulasta    09/08/2010 - 11/04/2010 Radiation Therapy   XRT was given to the left breast and  chall wall consisting of 4780 in 26 fractions with an 1800 cGy boost in 9 fractions Dr Valere Dross.   11/04/2010 - 04/17/2011 Chemotherapy   Tamoxifen taken sporadically and discontinued due to vaginal bleeding.    08/30/2011 Surgery   Laparoscopic assisted vaginal hysterectomy.   04/10/2012  Imaging   MRI of the brain normal.  No cause of the patient's headache identified.    12/17/2012 Imaging   CT scan of abdomen and pelvis showed no acute findings in the abdomen or pelvis.  There were felt to be hepatic hemangiomas present, mesuring 2.5 cm in the superior right heaptic lobe at the hepatic dome and a 7 mm lesion located anteriorly.    08/23/2013 Imaging   Digital diagnostic unilateral right mammogram showed no mamographic or sonographic evidence of malignancy, right breast.      09/11/2013 -  Anti-estrogen oral therapy   Tamoxifen 20 mg once daily, planning for 10 years.   11/26/2016 Imaging   MR Cervical Spine W WO Contrast IMPRESSION: Small left sided disc protrusion at C5-6 extending into the foramen. This could be a source of radiculopathy involving the left C6 nerve root. Diffusely abnormal bone marrow likely related to prior chemotherapy or anemia.   03/17/2017 Imaging   US Abdomen IMPRESSION: No acute or focal abnormality identified.    03/17/2017 Imaging   US Abdomen Complete  IMPRESSION: No acute or focal abnormality identified.   03/23/2018 Imaging   03/23/2018 Mammogram IMPRESSION: No evidence of malignancy.   09/12/2018 Surgery   She underwent breast reconstruction on 09/12/2018 with Dr Harlow Mares      INTERVAL HISTORY:  Victoria Delacruz is here for a follow up of breast cancer. She was last seen by me on 03/05/2022. She presents to the clinic alone.Pt stated she is experiencing sharp pain in the left chest wall.Pt  stated having pain for a month, difficult to raise arm. Pt has fibromyalgia.Pt stated she does go to the park for exercise.Pt has no other concerns.Pt stated taking Tamoxifen for 10 years inconsistently, experiencing hot flashes with the medication.Pt will continue with Tamoxifen.   All other systems were reviewed with the patient and are negative.  MEDICAL HISTORY:  Past Medical History:  Diagnosis Date   Anemia    Arthritis    Blood  transfusion 05/17/11, 05/25/11   anemia   Breast cancer ([place]) 2004 and 2011   Depression    Dizziness    ECTOPIC PREGNANCY 1997 and 2011   x 2   Family history of breast cancer    grandmother   Fibroid    Fibromyalgia    Hemorrhoids 2020   Hypertension    Personal history of radiation therapy 2011    SURGICAL HISTORY: Past Surgical History:  Procedure Laterality Date   ABDOMINAL HYSTERECTOMY     AUGMENTATION MAMMAPLASTY Left    BREAST BIOPSY Left 07/05/2003   malignant   BREAST BIOPSY Left 2011   malignant   EVALUATION UNDER ANESTHESIA WITH HEMORRHOIDECTOMY N/A 05/17/2019   Procedure: HEMORRHOIDECTOMY, HEMORRHOID LIGATION/PEXY, ANORECTAL EXAM UNDER ANESTHESIA., REMOVAL ANAL CRYPT POLYP;  Surgeon: Michael Boston, MD;  Location: [facility];  Service: General;  Laterality: N/A;   LAPAROSCOPIC ASSISTED VAGINAL HYSTERECTOMY  08/30/2011   Procedure: LAPAROSCOPIC ASSISTED VAGINAL HYSTERECTOMY;  Surgeon: Osborne Oman, MD;  Location: [facility];  Service: Gynecology;  Laterality: N/A;   LAPAROSCOPY FOR ECTOPIC PREGNANCY     LAPAROSCOPY W/ MINI-LAPAROTOMY     MASTECTOMY Left 2001   complete with reconstruction  x 3, no b/p punctures to left arm   RECONSTRUCTION BREAST IMMEDIATE / DELAYED W/ TISSUE EXPANDER  2004   REDUCTION MAMMAPLASTY Right     I have reviewed the social history and family history with the patient and they are unchanged from previous note.  ALLERGIES:  is allergic to citalopram hydrobromide, duloxetine hcl, fluoxetine, pregabalin, and venlafaxine.  MEDICATIONS:  Current Outpatient Medications  Medication Sig Dispense Refill   amLODipine (NORVASC) 5 MG tablet Take 1 tablet (5 mg total) by mouth daily. 90 tablet 3   aspirin-acetaminophen-caffeine (EXCEDRIN MIGRAINE) 202-334-35 MG per tablet Take 1 tablet by mouth every 6 (six) hours as needed for headache.     Cetirizine HCl 10 MG CAPS Take 1 capsule (10 mg total) by mouth at bedtime. 30 capsule 5    cholecalciferol (VITAMIN D) 1000 units tablet Take 5,000 Units by mouth daily.     cyclobenzaprine (FLEXERIL) 10 MG tablet Take 1 tablet (10 mg total) by mouth at bedtime. (Patient taking differently: Take 10 mg by mouth at bedtime as needed.) 20 tablet 0   LORazepam (ATIVAN) 0.5 MG tablet      naproxen (NAPROSYN) 500 MG tablet      potassium chloride (KLOR-CON M) 10 MEQ tablet Take 1 tablet (10 mEq total) by mouth 2 (two) times daily. 30 tablet 1   propranolol (INDERAL) 20 MG tablet      SUMAtriptan (IMITREX) 100 MG tablet Take 1 tablet at earliest onset of headache.  May repeat once in 2 hours if headache persists or recurs. (Patient taking differently: Take 1 tablet at earliest onset of headache.  May repeat once in 2 hours if headache persists or recurs.) 10 tablet 2   tamoxifen (NOLVADEX) 20 MG tablet Take 1 tablet (20 mg total) by mouth daily. 90 tablet 3   topiramate (TOPAMAX) 50 MG tablet Take 1 tablet (50 mg total) by mouth at bedtime. 30 tablet 2   No current facility-administered medications for this visit.    PHYSICAL EXAMINATION: ECOG PERFORMANCE STATUS: 1 - Symptomatic but completely ambulatory  Vitals:   09/17/22 0904  BP: 124/85  Pulse: 81  Resp: 15  Temp: 98.4 F (36.9 C)  SpO2: 97%   Wt Readings from Last 3 Encounters:  09/17/22 163 lb 4.8 oz (74.1 kg)  03/02/22 164 lb 6.4 oz (74.6 kg)  12/13/21 169 lb (76.7 kg)     GENERAL:alert, no distress and comfortable SKIN: skin color, texture, turgor are normal, no rashes or significant lesions EYES: normal, Conjunctiva are pink and non-injected, sclera clear NECK: supple, thyroid normal size, non-tender, without nodularity LYMPH:  no palpable lymphadenopathy in the cervical, axillary LUNGS: clear to auscultation and percussion with normal breathing effort HEART: regular rate & rhythm and no murmurs and no lower extremity edema ABDOMEN:abdomen soft, non-tender and normal bowel sounds Musculoskeletal:no cyanosis of  digits and no clubbing  NEURO: alert & oriented x 3 with fluent speech, no focal motor/sensory deficits BREAST: s/p left mastectomy and reconstruction. No palpable mass, nodules or adenopathy bilaterally. Breast exam benign. Breast tenderness.  LABORATORY DATA:  I have reviewed the data as listed    Latest Ref Rng & Units 03/02/2022   11:05 AM 12/13/2021    6:30 AM 08/21/2021    9:06 AM  CBC  WBC 4.0 - 10.5 K/uL 7.4  6.7  5.9   Hemoglobin 12.0 - 15.0 g/dL 12.6  12.2  12.8   Hematocrit 36.0 - 46.0 % 35.7  37.0  37.2  Platelets 150 - 400 K/uL 237  264  293         Latest Ref Rng & Units 03/02/2022   11:05 AM 12/13/2021    6:30 AM 08/21/2021    9:06 AM  CMP  Glucose 70 - 99 mg/dL 116  112  112   BUN 6 - 20 mg/dL _0 Creatinine 0.44 - 1.00 mg/dL 0.97  0.87  0.81   Sodium 135 - 145 mmol/L 141  140  140   Potassium 3.5 - 5.1 mmol/L 3.3  3.3  3.4   Chloride 98 - 111 mmol/L 107  105  109   CO2 22 - 32 mmol/L _1 Calcium 8.9 - 10.3 mg/dL 9.5  9.3  9.1   Total Protein 6.5 - 8.1 g/dL 7.5   8.1   Total Bilirubin 0.3 - 1.2 mg/dL 0.4   0.6   Alkaline Phos 38 - 126 U/L 80   86   AST 15 - 41 U/L 23   22   ALT 0 - 44 U/L 12   17       RADIOGRAPHIC STUDIES: I have personally reviewed the radiological images as listed and agreed with the findings in the report. No results found.    Orders Placed This Encounter  Procedures   MR BREAST BILATERAL W WO CONTRAST INC CAD    Standing Status:   Future    Standing Expiration Date:   09/18/2023    Order Specific Question:   If indicated for the ordered procedure, I authorize the administration of contrast media per Radiology protocol    Answer:   Yes    Order Specific Question:   What is the patient's sedation requirement?    Answer:   No Sedation    Order Specific Question:   Does the patient have a pacemaker or implanted devices?    Answer:   No    Order Specific Question:   Radiology Contrast Protocol - do NOT remove file  path    Answer:   [email or web address]    Order Specific Question:   Preferred imaging location?    Answer:   [facility] (table limit-550lbs)   DG Chest 2 View    Standing Status:   Future    Standing Expiration Date:   09/17/2023    Order Specific Question:   Reason for Exam (SYMPTOM  OR DIAGNOSIS REQUIRED)    Answer:   chest pain    Order Specific Question:   Is patient pregnant?    Answer:   No    Order Specific Question:   Preferred imaging location?    Answer:   [facility]   MM Digital Screening Unilat R    Standing Status:   Future    Standing Expiration Date:   09/17/2023    Order Specific Question:   Reason for Exam (SYMPTOM  OR DIAGNOSIS REQUIRED)    Answer:   screening    Order Specific Question:   Is the patient pregnant?    Answer:   No    Order Specific Question:   Preferred imaging location?    Answer:   [facility]   All questions were answered. The patient knows to call the clinic with any problems, questions or concerns. No barriers to learning was detected. The total time spent in the appointment was 30 minutes.     Truitt Merle, MD 09/17/2022   I, Joellen Jersey  [place], am acting as scribe for Truitt Merle, MD.   I have reviewed the above documentation for accuracy and completeness, and I agree with the above.

## 2022-09-17 ENCOUNTER — Other Ambulatory Visit: Payer: Self-pay

## 2022-09-17 ENCOUNTER — Encounter: Payer: Self-pay | Admitting: Hematology

## 2022-09-17 ENCOUNTER — Inpatient Hospital Stay: Payer: Medicare Other | Admitting: Hematology

## 2022-09-17 ENCOUNTER — Inpatient Hospital Stay: Payer: Medicare Other

## 2022-09-17 VITALS — BP 124/85 | HR 81 | Temp 98.4°F | Resp 15 | Wt 163.3 lb

## 2022-09-17 DIAGNOSIS — Z923 Personal history of irradiation: Secondary | ICD-10-CM | POA: Diagnosis not present

## 2022-09-17 DIAGNOSIS — Z1231 Encounter for screening mammogram for malignant neoplasm of breast: Secondary | ICD-10-CM | POA: Diagnosis not present

## 2022-09-17 DIAGNOSIS — C50412 Malignant neoplasm of upper-outer quadrant of left female breast: Secondary | ICD-10-CM

## 2022-09-17 DIAGNOSIS — Z170 Estrogen receptor positive status [ER+]: Secondary | ICD-10-CM

## 2022-09-17 DIAGNOSIS — Z90710 Acquired absence of both cervix and uterus: Secondary | ICD-10-CM | POA: Diagnosis not present

## 2022-09-17 DIAGNOSIS — Z9012 Acquired absence of left breast and nipple: Secondary | ICD-10-CM | POA: Diagnosis not present

## 2022-09-17 DIAGNOSIS — C50912 Malignant neoplasm of unspecified site of left female breast: Secondary | ICD-10-CM | POA: Diagnosis not present

## 2022-09-17 DIAGNOSIS — I10 Essential (primary) hypertension: Secondary | ICD-10-CM | POA: Diagnosis not present

## 2022-09-17 DIAGNOSIS — Z79810 Long term (current) use of selective estrogen receptor modulators (SERMs): Secondary | ICD-10-CM | POA: Diagnosis not present

## 2022-09-17 DIAGNOSIS — R0789 Other chest pain: Secondary | ICD-10-CM | POA: Diagnosis not present

## 2022-09-17 DIAGNOSIS — Z79899 Other long term (current) drug therapy: Secondary | ICD-10-CM | POA: Diagnosis not present

## 2022-09-17 DIAGNOSIS — E559 Vitamin D deficiency, unspecified: Secondary | ICD-10-CM | POA: Diagnosis not present

## 2022-09-17 DIAGNOSIS — Z86000 Personal history of in-situ neoplasm of breast: Secondary | ICD-10-CM | POA: Diagnosis not present

## 2022-09-17 DIAGNOSIS — M797 Fibromyalgia: Secondary | ICD-10-CM | POA: Diagnosis not present

## 2022-09-17 LAB — VITAMIN D 25 HYDROXY (VIT D DEFICIENCY, FRACTURES): Vit D, 25-Hydroxy: 31.22 ng/mL (ref 30–100)

## 2022-09-21 ENCOUNTER — Ambulatory Visit (HOSPITAL_COMMUNITY)
Admission: RE | Admit: 2022-09-21 | Discharge: 2022-09-21 | Disposition: A | Discharge status: Home / Self Care | Payer: Medicare Other | Source: Ambulatory Visit | Attending: Hematology | Admitting: Hematology

## 2022-09-21 DIAGNOSIS — Z170 Estrogen receptor positive status [ER+]: Secondary | ICD-10-CM | POA: Insufficient documentation

## 2022-09-21 DIAGNOSIS — C50412 Malignant neoplasm of upper-outer quadrant of left female breast: Secondary | ICD-10-CM | POA: Diagnosis not present

## 2022-09-21 DIAGNOSIS — R079 Chest pain, unspecified: Secondary | ICD-10-CM | POA: Diagnosis not present

## 2022-10-02 ENCOUNTER — Encounter: Payer: Self-pay | Admitting: Hematology

## 2022-10-12 DIAGNOSIS — I10 Essential (primary) hypertension: Secondary | ICD-10-CM | POA: Diagnosis not present

## 2022-10-12 DIAGNOSIS — E559 Vitamin D deficiency, unspecified: Secondary | ICD-10-CM | POA: Diagnosis not present

## 2022-10-12 DIAGNOSIS — M797 Fibromyalgia: Secondary | ICD-10-CM | POA: Diagnosis not present

## 2022-10-12 DIAGNOSIS — K219 Gastro-esophageal reflux disease without esophagitis: Secondary | ICD-10-CM | POA: Diagnosis not present

## 2022-10-13 ENCOUNTER — Ambulatory Visit
Admission: RE | Admit: 2022-10-13 | Discharge: 2022-10-13 | Disposition: A | Discharge status: Home / Self Care | Payer: Medicare Other | Source: Ambulatory Visit | Attending: Hematology | Admitting: Hematology

## 2022-10-13 DIAGNOSIS — Z86000 Personal history of in-situ neoplasm of breast: Secondary | ICD-10-CM

## 2022-10-13 DIAGNOSIS — Z1239 Encounter for other screening for malignant neoplasm of breast: Secondary | ICD-10-CM | POA: Diagnosis not present

## 2022-10-13 MED ORDER — GADOPICLENOL 0.5 MMOL/ML IV SOLN
8.0000 mL | Freq: Once | INTRAVENOUS | Status: AC | PRN
  Administered 2022-10-13: 8 mL via INTRAVENOUS

## 2022-10-28 ENCOUNTER — Telehealth: Payer: Self-pay

## 2022-10-28 NOTE — Telephone Encounter (Addendum)
Called patient and read her the results from her MRI    ----- Message from Truitt Merle, MD sent at 10/27/2022  2:19 PM EST ----- Please let pt know her breast MRI results, thanks   YF

## 2023-01-07 DIAGNOSIS — J069 Acute upper respiratory infection, unspecified: Secondary | ICD-10-CM | POA: Diagnosis not present

## 2023-01-07 DIAGNOSIS — Z03818 Encounter for observation for suspected exposure to other biological agents ruled out: Secondary | ICD-10-CM | POA: Diagnosis not present

## 2023-02-17 ENCOUNTER — Ambulatory Visit
Admission: RE | Admit: 2023-02-17 | Discharge: 2023-02-17 | Disposition: A | Discharge status: Home / Self Care | Payer: Medicare Other | Source: Ambulatory Visit | Attending: Hematology | Admitting: Hematology

## 2023-02-17 DIAGNOSIS — Z1231 Encounter for screening mammogram for malignant neoplasm of breast: Secondary | ICD-10-CM | POA: Diagnosis not present

## 2023-02-22 DIAGNOSIS — J301 Allergic rhinitis due to pollen: Secondary | ICD-10-CM | POA: Diagnosis not present

## 2023-02-22 DIAGNOSIS — Z79899 Other long term (current) drug therapy: Secondary | ICD-10-CM | POA: Diagnosis not present

## 2023-02-22 DIAGNOSIS — I10 Essential (primary) hypertension: Secondary | ICD-10-CM | POA: Diagnosis not present

## 2023-02-22 DIAGNOSIS — E559 Vitamin D deficiency, unspecified: Secondary | ICD-10-CM | POA: Diagnosis not present

## 2023-02-22 DIAGNOSIS — K581 Irritable bowel syndrome with constipation: Secondary | ICD-10-CM | POA: Diagnosis not present

## 2023-02-22 DIAGNOSIS — Z0184 Encounter for antibody response examination: Secondary | ICD-10-CM | POA: Diagnosis not present

## 2023-02-22 DIAGNOSIS — K219 Gastro-esophageal reflux disease without esophagitis: Secondary | ICD-10-CM | POA: Diagnosis not present

## 2023-02-22 DIAGNOSIS — R7301 Impaired fasting glucose: Secondary | ICD-10-CM | POA: Diagnosis not present

## 2023-02-22 DIAGNOSIS — Z0000 Encounter for general adult medical examination without abnormal findings: Secondary | ICD-10-CM | POA: Diagnosis not present

## 2023-02-22 DIAGNOSIS — M797 Fibromyalgia: Secondary | ICD-10-CM | POA: Diagnosis not present

## 2023-03-15 NOTE — Progress Notes (Deleted)
[facility] OFFICE PROGRESS NOTE  Laurann Montana, MD [phone] [address] [place] [address]  DIAGNOSIS: f/u of left breast cancer.   Oncology History  Malignant neoplasm of female breast (HCC)  08/02/2003 Initial Diagnosis   MALIGNANT NEOPLASM OF BREAST UNSPECIFIED SITE. High-grade DCIS of Left breast; Tumor was 4.0 cm. Sentinel lymph nodes were negative.  ER, 24%; PR 2%.  Patient had saline implant.    08/02/2003 Surgery   L mastectomy with reconstruction Francina Ames   05/07/2010 Surgery   R port-a-cath placement   05/11/2010 Relapse/Recurrence   Invasive ductal carcinoma of L breast; ER: 99%, positive; PR: 53%, positive; Ki 67 (mib-1): 20%; hER@ neu by FISH without ampliication; ratio of her 2 CEP 17 was 1.17.    05/28/2010 Surgery   Local excision of the L invasive ductal carcinoma by Claud Kelp.   06/08/2010 - 08/10/2010 Chemotherapy   Completed 4 cycles of adjuvant chemotherapy with cytoxan and taxotere in combination with neulasta    09/08/2010 - 11/04/2010 Radiation Therapy   XRT was given to the left breast and chall wall consisting of 4780 in 26 fractions with an 1800 cGy boost in 9 fractions Dr Dayton Scrape.   11/04/2010 - 04/17/2011 Chemotherapy   Tamoxifen taken sporadically and discontinued due to vaginal bleeding.    08/30/2011 Surgery   Laparoscopic assisted vaginal hysterectomy.   04/10/2012 Imaging   MRI of the brain normal.  No cause of the patient's headache identified.    12/17/2012 Imaging   CT scan of abdomen and pelvis showed no acute findings in the abdomen or pelvis.  There were felt to be hepatic hemangiomas present, mesuring 2.5 cm in the superior right heaptic lobe at the hepatic dome and a 7 mm lesion located anteriorly.    08/23/2013 Imaging   Digital diagnostic unilateral right mammogram showed no mamographic or sonographic evidence of malignancy, right breast.      09/11/2013 -  Anti-estrogen oral therapy   Tamoxifen 20 mg once  daily, planning for 10 years.   11/26/2016 Imaging   MR Cervical Spine W WO Contrast IMPRESSION: Small left sided disc protrusion at C5-6 extending into the foramen. This could be a source of radiculopathy involving the left C6 nerve root. Diffusely abnormal bone marrow likely related to prior chemotherapy or anemia.   03/17/2017 Imaging   US Abdomen IMPRESSION: No acute or focal abnormality identified.    03/17/2017 Imaging   US Abdomen Complete  IMPRESSION: No acute or focal abnormality identified.   03/23/2018 Imaging   03/23/2018 Mammogram IMPRESSION: No evidence of malignancy.   09/12/2018 Surgery   She underwent breast reconstruction on 09/12/2018 with Dr Odis Luster      CURRENT THERAPY:  Tamoxifen started in 11/2009, held 04/2011-08/2013, and restarted on 09/11/2013 . Discontinued on ***due to hot flashes  INTERVAL HISTORY: Victoria Delacruz 52 y.o. female returns to clinic today for follow-up visit.  The patient was last seen 6 months ago by Dr. Mosetta Putt.  At that point in time, Dr. Mosetta Putt and the patient had discussion about discontinuing her tamoxifen due to adverse side effects with hot flashes.  The patient last took her tamoxifen on ***.  She is feeling ****at this time.  At her last appointment, Dr. Mosetta Putt and the patient also discussed annual mammograms and breast MRIs in between her mammograms.  She had her last breast MRI in December which was negative and she is up-to-date on her mammograms which was performed last month.  She denies any new concerning complaints today.  Denies any unusual headaches or vision changes.  Denies any shortness of breath, cough, or hemoptysis.  Denies any new lymphadenopathy, breast masses, nipple discharge, or breast lumps.  Denies any unusual bone pain although she does have fibromyalgia for which she currently takes ***.  She denies any jaundice or itching.  Denies any nausea, vomiting, diarrhea, or constipation.     MEDICAL HISTORY: Past  Medical History:  Diagnosis Date   Anemia    Arthritis    Blood transfusion 05/17/11, 05/25/11   anemia   Breast cancer (HCC) 2004 and 2011   Depression    Dizziness    ECTOPIC PREGNANCY 1997 and 2011   x 2   Family history of breast cancer    grandmother   Fibroid    Fibromyalgia    Hemorrhoids 2020   Hypertension    Personal history of radiation therapy 2011    ALLERGIES:  is allergic to citalopram hydrobromide, duloxetine hcl, fluoxetine, pregabalin, and venlafaxine.  MEDICATIONS:  Current Outpatient Medications  Medication Sig Dispense Refill   amLODipine (NORVASC) 5 MG tablet Take 1 tablet (5 mg total) by mouth daily. 90 tablet 3   aspirin-acetaminophen-caffeine (EXCEDRIN MIGRAINE) 250-250-65 MG per tablet Take 1 tablet by mouth every 6 (six) hours as needed for headache.     Cetirizine HCl 10 MG CAPS Take 1 capsule (10 mg total) by mouth at bedtime. 30 capsule 5   cholecalciferol (VITAMIN D) 1000 units tablet Take 5,000 Units by mouth daily.     cyclobenzaprine (FLEXERIL) 10 MG tablet Take 1 tablet (10 mg total) by mouth at bedtime. (Patient taking differently: Take 10 mg by mouth at bedtime as needed.) 20 tablet 0   LORazepam (ATIVAN) 0.5 MG tablet      naproxen (NAPROSYN) 500 MG tablet      potassium chloride (KLOR-CON M) 10 MEQ tablet Take 1 tablet (10 mEq total) by mouth 2 (two) times daily. 30 tablet 1   propranolol (INDERAL) 20 MG tablet      SUMAtriptan (IMITREX) 100 MG tablet Take 1 tablet at earliest onset of headache.  May repeat once in 2 hours if headache persists or recurs. (Patient taking differently: Take 1 tablet at earliest onset of headache.  May repeat once in 2 hours if headache persists or recurs.) 10 tablet 2   tamoxifen (NOLVADEX) 20 MG tablet Take 1 tablet (20 mg total) by mouth daily. 90 tablet 3   topiramate (TOPAMAX) 50 MG tablet Take 1 tablet (50 mg total) by mouth at bedtime. 30 tablet 2   No current facility-administered medications for this  visit.    SURGICAL HISTORY:  Past Surgical History:  Procedure Laterality Date   ABDOMINAL HYSTERECTOMY     AUGMENTATION MAMMAPLASTY Left    BREAST BIOPSY Left 07/05/2003   malignant   BREAST BIOPSY Left 2011   malignant   EVALUATION UNDER ANESTHESIA WITH HEMORRHOIDECTOMY N/A 05/17/2019   Procedure: HEMORRHOIDECTOMY, HEMORRHOID LIGATION/PEXY, ANORECTAL EXAM UNDER ANESTHESIA., REMOVAL ANAL CRYPT POLYP;  Surgeon: Karie Soda, MD;  Location: [facility];  Service: General;  Laterality: N/A;   LAPAROSCOPIC ASSISTED VAGINAL HYSTERECTOMY  08/30/2011   Procedure: LAPAROSCOPIC ASSISTED VAGINAL HYSTERECTOMY;  Surgeon: Tereso Newcomer, MD;  Location: [facility];  Service: Gynecology;  Laterality: N/A;   LAPAROSCOPY FOR ECTOPIC PREGNANCY     LAPAROSCOPY W/ MINI-LAPAROTOMY     MASTECTOMY Left 2001   complete with reconstruction x 3, no b/p punctures to left  arm   RECONSTRUCTION BREAST IMMEDIATE / DELAYED W/ TISSUE EXPANDER  2004   REDUCTION MAMMAPLASTY Right     REVIEW OF SYSTEMS:   Review of Systems  Constitutional: Negative for appetite change, chills, fatigue, fever and unexpected weight change.  HENT:   Negative for mouth sores, nosebleeds, sore throat and trouble swallowing.   Eyes: Negative for eye problems and icterus.  Respiratory: Negative for cough, hemoptysis, shortness of breath and wheezing.   Cardiovascular: Negative for chest pain and leg swelling.  Gastrointestinal: Negative for abdominal pain, constipation, diarrhea, nausea and vomiting.  Genitourinary: Negative for bladder incontinence, difficulty urinating, dysuria, frequency and hematuria.   Musculoskeletal: Negative for back pain, gait problem, neck pain and neck stiffness.  Skin: Negative for itching and rash.  Neurological: Negative for dizziness, extremity weakness, gait problem, headaches, light-headedness and seizures.  Hematological: Negative for adenopathy. Does not bruise/bleed easily.   Psychiatric/Behavioral: Negative for confusion, depression and sleep disturbance. The patient is not nervous/anxious.     PHYSICAL EXAMINATION:  Last menstrual period 04/23/2011.  ECOG PERFORMANCE STATUS: {CHL ONC ECOG [id number]  Physical Exam  Constitutional: Oriented to person, place, and time and well-developed, well-nourished, and in no distress. No distress.  HENT:  Head: Normocephalic and atraumatic.  Mouth/Throat: Oropharynx is clear and moist. No oropharyngeal exudate.  Eyes: Conjunctivae are normal. Right eye exhibits no discharge. Left eye exhibits no discharge. No scleral icterus.  Neck: Normal range of motion. Neck supple.  Cardiovascular: Normal rate, regular rhythm, normal heart sounds and intact distal pulses.   Pulmonary/Chest: Effort normal and breath sounds normal. No respiratory distress. No wheezes. No rales.  Abdominal: Soft. Bowel sounds are normal. Exhibits no distension and no mass. There is no tenderness.  Musculoskeletal: Normal range of motion. Exhibits no edema.  Lymphadenopathy:    No cervical adenopathy.  Neurological: Alert and oriented to person, place, and time. Exhibits normal muscle tone. Gait normal. Coordination normal.  Skin: Skin is warm and dry. No rash noted. Not diaphoretic. No erythema. No pallor.  Psychiatric: Mood, memory and judgment normal.  Vitals reviewed.  LABORATORY DATA: Lab Results  Component Value Date   WBC 7.4 03/02/2022   HGB 12.6 03/02/2022   HCT 35.7 (L) 03/02/2022   MCV 87.5 03/02/2022   PLT 237 03/02/2022      Chemistry      Component Value Date/Time   NA 141 03/02/2022 1105   NA 140 06/22/2017 0815   K 3.3 (L) 03/02/2022 1105   K 3.5 06/22/2017 0815   CL 107 03/02/2022 1105   CL 109 (H) 02/20/2013 1044   CO2 29 03/02/2022 1105   CO2 26 06/22/2017 0815   BUN 12 03/02/2022 1105   BUN 9.3 06/22/2017 0815   CREATININE 0.97 03/02/2022 1105   CREATININE 0.81 08/21/2021 0906   CREATININE 0.9 06/22/2017  0815      Component Value Date/Time   CALCIUM 9.5 03/02/2022 1105   CALCIUM 9.3 06/22/2017 0815   ALKPHOS 80 03/02/2022 1105   ALKPHOS 85 06/22/2017 0815   AST 23 03/02/2022 1105   AST 22 08/21/2021 0906   AST 19 06/22/2017 0815   ALT 12 03/02/2022 1105   ALT 17 08/21/2021 0906   ALT 11 06/22/2017 0815   BILITOT 0.4 03/02/2022 1105   BILITOT 0.6 08/21/2021 0906   BILITOT 0.75 06/22/2017 0815       RADIOGRAPHIC STUDIES:  MM 3D SCREEN BREAST UNI RIGHT  Result Date: 02/17/2023 CLINICAL DATA:  Screening. EXAM: DIGITAL SCREENING UNILATERAL  RIGHT MAMMOGRAM WITH CAD AND TOMOSYNTHESIS TECHNIQUE: Right screening digital craniocaudal and mediolateral oblique mammograms were obtained. Right screening digital breast tomosynthesis was performed. The images were evaluated with computer-aided detection. COMPARISON:  Previous exam(s). ACR Breast Density Category b: There are scattered areas of fibroglandular density. FINDINGS: The patient has had a left mastectomy. There are no findings suspicious for malignancy. IMPRESSION: No mammographic evidence of malignancy. A result letter of this screening mammogram will be mailed directly to the patient. RECOMMENDATION: Screening mammogram in one year.  (Code:SM-R-63M) BI-RADS CATEGORY  1: Negative. Electronically Signed   By: Harmon Pier M.D.   On: 02/17/2023 17:52     ASSESSMENT/PLAN:  Victoria Delacruz is a 52 y.o. post hysterectomy female with    1. Left breast invasive duct carcinoma (ER+,PR+,HER2 negative), [id number], stage Ia diagnosed in 2011, with prior left DCIS in 2004 -s/p left mastectomy in 08/2003 for 4 cm high grade DCIS -Unfortunately she had left breast cancer recurrence in 05/2010, invasive. She was treated with local excision surgery, adjuvant chemo CT and adjuvant radiation. She underwent breast reconstruction in 09/12/18 with Dr Odis Luster.  -She declined genetics -She has been taking Tamoxifen since 2011 held due to vaginal bleeding and  restarted in 07/2013. She did have hysterectomy.  -At her last appointment with Dr. Mosetta Putt in November 2023, Due to hot flashes, she agreed to have the patient come off Tamoxifen at the end of 2023. The patient discontinued this on ***.  -She is currently undergoing breast MRIs in between mammograms, most recent in December 2023 which was negative.  -last right mammogram 02/17/23 was negative. Reviewed today.  -From a breast cancer standpoint, She is clinically doing well. Lab reviewed, her CBC and CMP are within normal limits.  -Continue surveillance. Next mammogram in 01/2023 -Lab and f/u in 6 months    2. Left breast/chest wall pain ***. -At her last appointment with Dr. Mosetta Putt, she expressed concern with her left breast not being checked by mammogram. Dr. Mosetta Putt discussed the option of annual screening MRI. Her MRI in December 2023 was negative for malignancy -Order MRI for November/December 2024   3. HTN, Fibromyalgia, chronic body pain, H/o migraines, Recent worsening HA -She follows up with her primary care physician. -Her 04/2012 and 10/30/20 Brain MRI were unremarkable. -fibromyalgia managed with Cymbalta, muscle relaxer, ibuprofen.    4. Vit D Deficiency  -Per patient her Vit D level was low end of April with PCP. She was started on 5000 units Vit D.  -last in 03/2022 was 31.22. Today results is still pending.***     Plan -Lab and f/u in 6 months, per pt request -Breast MRI in November 2024 -Next annual screening right breast mammogram in April 2025    No orders of the defined types were placed in this encounter.    I spent {CHL ONC TIME VISIT - [id number]} counseling the patient face to face. The total time spent in the appointment was {CHL ONC TIME VISIT - BJYNW:[id number]}.  Florenda Watt L Elzabeth Mcquerry, PA-C 03/15/23

## 2023-03-16 ENCOUNTER — Other Ambulatory Visit: Payer: Self-pay

## 2023-03-16 DIAGNOSIS — Z170 Estrogen receptor positive status [ER+]: Secondary | ICD-10-CM

## 2023-03-17 ENCOUNTER — Telehealth: Payer: Self-pay | Admitting: Physician Assistant

## 2023-03-18 ENCOUNTER — Inpatient Hospital Stay: Payer: Medicare Other | Attending: Family Medicine

## 2023-03-18 ENCOUNTER — Inpatient Hospital Stay: Payer: Medicare Other | Admitting: Physician Assistant

## 2023-03-21 ENCOUNTER — Telehealth: Payer: Self-pay | Admitting: Physician Assistant

## 2023-03-21 NOTE — Telephone Encounter (Signed)
Rescheduled 05/17 appointment to 05/31, patient has is notified.

## 2023-03-30 NOTE — Progress Notes (Deleted)
[facility] OFFICE PROGRESS NOTE  Victoria Montana, MD [phone] Victoria Delacruz [address] [place] [address]  DIAGNOSIS: f/u of left breast cancer.   Oncology History  Malignant neoplasm of female breast (HCC)  08/02/2003 Initial Diagnosis   MALIGNANT NEOPLASM OF BREAST UNSPECIFIED SITE. High-grade DCIS of Left breast; Tumor was 4.0 cm. Sentinel lymph nodes were negative.  ER, 24%; PR 2%.  Patient had saline implant.    08/02/2003 Surgery   L mastectomy with reconstruction Victoria Delacruz   05/07/2010 Surgery   R port-a-cath placement   05/11/2010 Relapse/Recurrence   Invasive ductal carcinoma of L breast; ER: 99%, positive; PR: 53%, positive; Ki 67 (mib-1): 20%; hER@ neu by FISH without ampliication; ratio of her 2 CEP 17 was 1.17.    05/28/2010 Surgery   Local excision of the L invasive ductal carcinoma by Victoria Delacruz.   06/08/2010 - 08/10/2010 Chemotherapy   Completed 4 cycles of adjuvant chemotherapy with cytoxan and taxotere in combination with neulasta    09/08/2010 - 11/04/2010 Radiation Therapy   XRT was given to the left breast and chall wall consisting of 4780 in 26 fractions with an 1800 cGy boost in 9 fractions Victoria Delacruz.   11/04/2010 - 04/17/2011 Chemotherapy   Tamoxifen taken sporadically and discontinued due to vaginal bleeding.    08/30/2011 Surgery   Laparoscopic assisted vaginal hysterectomy.   04/10/2012 Imaging   MRI of the brain normal.  No cause of the patient's headache identified.    12/17/2012 Imaging   CT scan of abdomen and pelvis showed no acute findings in the abdomen or pelvis.  There were felt to be hepatic hemangiomas present, mesuring 2.5 cm in the superior right heaptic lobe at the hepatic dome and a 7 mm lesion located anteriorly.    08/23/2013 Imaging   Digital diagnostic unilateral right mammogram showed no mamographic or sonographic evidence of malignancy, right breast.      09/11/2013 -  Anti-estrogen oral therapy   Tamoxifen 20 mg once  daily, planning for 10 years.   11/26/2016 Imaging   MR Cervical Spine W WO Contrast IMPRESSION: Small left sided disc protrusion at C5-6 extending into the foramen. This could be a source of radiculopathy involving the left C6 nerve root. Diffusely abnormal bone marrow likely related to prior chemotherapy or anemia.   03/17/2017 Imaging   US Abdomen IMPRESSION: No acute or focal abnormality identified.    03/17/2017 Imaging   US Abdomen Complete  IMPRESSION: No acute or focal abnormality identified.   03/23/2018 Imaging   03/23/2018 Mammogram IMPRESSION: No evidence of malignancy.   09/12/2018 Surgery   She underwent breast reconstruction on 09/12/2018 with Victoria Delacruz      CURRENT THERAPY: Tamoxifen started in 11/2009, held 04/2011-08/2013, and restarted on 09/11/2013 . Discontinued on ***due to hot flashes  INTERVAL HISTORY: Victoria Delacruz 52 y.o. female returns returns to clinic today for follow-up visit.  The patient was last seen 6 months ago by Victoria Delacruz.  At that point in time, Victoria Delacruz and the patient had discussion about discontinuing her tamoxifen due to adverse side effects with hot flashes.  The patient last took her tamoxifen on ***.  She is feeling ****at this time.  At her last appointment, Victoria Delacruz and the patient also discussed annual mammograms and breast MRIs in between her mammograms.  She had her last breast MRI in December which was negative and she is up-to-date on her mammograms which was performed last month.  She denies any new concerning complaints today.  Denies any unusual headaches or vision changes.  Denies any shortness of breath, cough, or hemoptysis.  Denies any new lymphadenopathy, breast masses, nipple discharge, or breast lumps.  Denies any unusual bone pain although she does have fibromyalgia for which she currently takes ***.  She denies any jaundice or itching.  Denies any nausea, vomiting, diarrhea, or constipation.  MEDICAL HISTORY: Past  Medical History:  Diagnosis Date   Anemia    Arthritis    Blood transfusion 05/17/11, 05/25/11   anemia   Breast cancer (HCC) 2004 and 2011   Depression    Dizziness    ECTOPIC PREGNANCY 1997 and 2011   x 2   Family history of breast cancer    grandmother   Fibroid    Fibromyalgia    Hemorrhoids 2020   Hypertension    Personal history of radiation therapy 2011    ALLERGIES:  is allergic to citalopram hydrobromide, duloxetine hcl, fluoxetine, pregabalin, and venlafaxine.  MEDICATIONS:  Current Outpatient Medications  Medication Sig Dispense Refill   amLODipine (NORVASC) 5 MG tablet Take 1 tablet (5 mg total) by mouth daily. 90 tablet 3   aspirin-acetaminophen-caffeine (EXCEDRIN MIGRAINE) 250-250-65 MG per tablet Take 1 tablet by mouth every 6 (six) hours as needed for headache.     Cetirizine HCl 10 MG CAPS Take 1 capsule (10 mg total) by mouth at bedtime. 30 capsule 5   cholecalciferol (VITAMIN D) 1000 units tablet Take 5,000 Units by mouth daily.     cyclobenzaprine (FLEXERIL) 10 MG tablet Take 1 tablet (10 mg total) by mouth at bedtime. (Patient taking differently: Take 10 mg by mouth at bedtime as needed.) 20 tablet 0   LORazepam (ATIVAN) 0.5 MG tablet      naproxen (NAPROSYN) 500 MG tablet      potassium chloride (KLOR-CON M) 10 MEQ tablet Take 1 tablet (10 mEq total) by mouth 2 (two) times daily. 30 tablet 1   propranolol (INDERAL) 20 MG tablet      SUMAtriptan (IMITREX) 100 MG tablet Take 1 tablet at earliest onset of headache.  May repeat once in 2 hours if headache persists or recurs. (Patient taking differently: Take 1 tablet at earliest onset of headache.  May repeat once in 2 hours if headache persists or recurs.) 10 tablet 2   tamoxifen (NOLVADEX) 20 MG tablet Take 1 tablet (20 mg total) by mouth daily. 90 tablet 3   topiramate (TOPAMAX) 50 MG tablet Take 1 tablet (50 mg total) by mouth at bedtime. 30 tablet 2   No current facility-administered medications for this  visit.    SURGICAL HISTORY:  Past Surgical History:  Procedure Laterality Date   ABDOMINAL HYSTERECTOMY     AUGMENTATION MAMMAPLASTY Left    BREAST BIOPSY Left 07/05/2003   malignant   BREAST BIOPSY Left 2011   malignant   EVALUATION UNDER ANESTHESIA WITH HEMORRHOIDECTOMY N/A 05/17/2019   Procedure: HEMORRHOIDECTOMY, HEMORRHOID LIGATION/PEXY, ANORECTAL EXAM UNDER ANESTHESIA., REMOVAL ANAL CRYPT POLYP;  Surgeon: Karie Soda, MD;  Location: [facility];  Service: General;  Laterality: N/A;   LAPAROSCOPIC ASSISTED VAGINAL HYSTERECTOMY  08/30/2011   Procedure: LAPAROSCOPIC ASSISTED VAGINAL HYSTERECTOMY;  Surgeon: Tereso Newcomer, MD;  Location: [facility];  Service: Gynecology;  Laterality: N/A;   LAPAROSCOPY FOR ECTOPIC PREGNANCY     LAPAROSCOPY W/ MINI-LAPAROTOMY     MASTECTOMY Left 2001   complete with reconstruction x 3, no b/p punctures to left arm  RECONSTRUCTION BREAST IMMEDIATE / DELAYED W/ TISSUE EXPANDER  2004   REDUCTION MAMMAPLASTY Right     REVIEW OF SYSTEMS:   Review of Systems  Constitutional: Negative for appetite change, chills, fatigue, fever and unexpected weight change.  HENT:   Negative for mouth sores, nosebleeds, sore throat and trouble swallowing.   Eyes: Negative for eye problems and icterus.  Respiratory: Negative for cough, hemoptysis, shortness of breath and wheezing.   Cardiovascular: Negative for chest pain and leg swelling.  Gastrointestinal: Negative for abdominal pain, constipation, diarrhea, nausea and vomiting.  Genitourinary: Negative for bladder incontinence, difficulty urinating, dysuria, frequency and hematuria.   Musculoskeletal: Negative for back pain, gait problem, neck pain and neck stiffness.  Skin: Negative for itching and rash.  Neurological: Negative for dizziness, extremity weakness, gait problem, headaches, light-headedness and seizures.  Hematological: Negative for adenopathy. Does not bruise/bleed easily.   Psychiatric/Behavioral: Negative for confusion, depression and sleep disturbance. The patient is not nervous/anxious.     PHYSICAL EXAMINATION:  Last menstrual period 04/23/2011.  ECOG PERFORMANCE STATUS: {CHL ONC ECOG [id number]  Physical Exam  Constitutional: Oriented to person, place, and time and well-developed, well-nourished, and in no distress. No distress.  HENT:  Head: Normocephalic and atraumatic.  Mouth/Throat: Oropharynx is clear and moist. No oropharyngeal exudate.  Eyes: Conjunctivae are normal. Right eye exhibits no discharge. Left eye exhibits no discharge. No scleral icterus.  Neck: Normal range of motion. Neck supple.  Cardiovascular: Normal rate, regular rhythm, normal heart sounds and intact distal pulses.   Pulmonary/Chest: Effort normal and breath sounds normal. No respiratory distress. No wheezes. No rales.  Abdominal: Soft. Bowel sounds are normal. Exhibits no distension and no mass. There is no tenderness.  Musculoskeletal: Normal range of motion. Exhibits no edema.  Lymphadenopathy:    No cervical adenopathy.  Neurological: Alert and oriented to person, place, and time. Exhibits normal muscle tone. Gait normal. Coordination normal.  Skin: Skin is warm and dry. No rash noted. Not diaphoretic. No erythema. No pallor.  Psychiatric: Mood, memory and judgment normal.  Vitals reviewed.  LABORATORY DATA: Lab Results  Component Value Date   WBC 7.4 03/02/2022   HGB 12.6 03/02/2022   HCT 35.7 (L) 03/02/2022   MCV 87.5 03/02/2022   PLT 237 03/02/2022      Chemistry      Component Value Date/Time   NA 141 03/02/2022 1105   NA 140 06/22/2017 0815   K 3.3 (L) 03/02/2022 1105   K 3.5 06/22/2017 0815   CL 107 03/02/2022 1105   CL 109 (H) 02/20/2013 1044   CO2 29 03/02/2022 1105   CO2 26 06/22/2017 0815   BUN 12 03/02/2022 1105   BUN 9.3 06/22/2017 0815   CREATININE 0.97 03/02/2022 1105   CREATININE 0.81 08/21/2021 0906   CREATININE 0.9 06/22/2017  0815      Component Value Date/Time   CALCIUM 9.5 03/02/2022 1105   CALCIUM 9.3 06/22/2017 0815   ALKPHOS 80 03/02/2022 1105   ALKPHOS 85 06/22/2017 0815   AST 23 03/02/2022 1105   AST 22 08/21/2021 0906   AST 19 06/22/2017 0815   ALT 12 03/02/2022 1105   ALT 17 08/21/2021 0906   ALT 11 06/22/2017 0815   BILITOT 0.4 03/02/2022 1105   BILITOT 0.6 08/21/2021 0906   BILITOT 0.75 06/22/2017 0815       RADIOGRAPHIC STUDIES:  No results found.   ASSESSMENT/PLAN:  JAZILYN SEIGLER is a 52 y.o. post hysterectomy female with  1. Left breast invasive duct carcinoma (ER+,PR+,HER2 negative), pT1aN0M0, stage Ia diagnosed in 2011, with prior left DCIS in 2004 -s/p left mastectomy in 08/2003 for 4 cm high grade DCIS -Unfortunately she had left breast cancer recurrence in 05/2010, invasive. She was treated with local excision surgery, adjuvant chemo CT and adjuvant radiation. She underwent breast reconstruction in 09/12/18 with Victoria Delacruz.  -She declined genetics -She has been taking Tamoxifen since 2011 held due to vaginal bleeding and restarted in 07/2013. She did have hysterectomy.  -At her last appointment with Victoria Delacruz in November 2023, Due to hot flashes, she agreed to have the patient come off Tamoxifen at the end of 2023. The patient discontinued this on ***.  -She is currently undergoing breast MRIs in between mammograms, most recent in December 2023 which was negative.  -last right mammogram 02/17/23 was negative. Reviewed today.  -From a breast cancer standpoint, She is clinically doing well. Lab reviewed, her CBC and CMP are within normal limits.  -Continue surveillance. Next mammogram in 01/2023 -Lab and f/u in 6 months    2. Left breast/chest wall pain ***. -At her last appointment with Victoria Delacruz, she expressed concern with her left breast not being checked by mammogram. Victoria Delacruz discussed the option of annual screening MRI. Her MRI in December 2023 was negative for  malignancy -Order MRI for November/December 2024   3. HTN, Fibromyalgia, chronic body pain, H/o migraines, Recent worsening HA -She follows up with her primary care physician. -Her 04/2012 and 10/30/20 Brain MRI were unremarkable. -fibromyalgia managed with Cymbalta, muscle relaxer, ibuprofen.    4. Vit D Deficiency  -Per patient her Vit D level was low end of April with PCP. She was started on 5000 units Vit D.  -last in 03/2022 was 31.22. Today results is still pending.***     Plan -Lab and f/u in 6 months, per pt request -Breast MRI in November 2024 -Next annual screening right breast mammogram in April 2025   No orders of the defined types were placed in this encounter.    I spent {CHL ONC TIME VISIT - ZOXWR:[id number]} counseling the patient face to face. The total time spent in the appointment was {CHL ONC TIME VISIT - BJYNW:[id number]}.  Cleatus Gabriel L Myria Steenbergen, PA-C 03/30/23

## 2023-04-01 ENCOUNTER — Inpatient Hospital Stay: Payer: Medicare Other | Admitting: Physician Assistant

## 2023-04-01 ENCOUNTER — Inpatient Hospital Stay: Payer: Medicare Other

## 2023-04-05 ENCOUNTER — Telehealth: Payer: Self-pay | Admitting: Hematology

## 2023-04-05 NOTE — Telephone Encounter (Signed)
Scheduled per 05/31 scheduling message, patient has been called and notified.

## 2023-04-14 NOTE — Assessment & Plan Note (Deleted)
pT1aN0M0, stage Ia diagnosed in 2011, with prior left DCIS in 2004 -s/p left mastectomy in 08/2003 for 4 cm high grade DCIS -Unfortunately she had left breast cancer recurrence in 05/2010, invasive. She was treated with local excision surgery, adjuvant chemo CT and adjuvant radiation. She underwent breast reconstruction in 09/12/18 with Dr Odis Luster.  -She declined genetics -She has been taking Tamoxifen since 2011 held due to vaginal bleeding and restarted in 07/2013. She did have hysterectomy.  -last right mammogram 02/15/22 was negative.  -From a breast cancer standpoint, She is clinically doing well. Lab reviewed, her CBC and CMP are within normal limits.  -We discuss MRI in between mammograms. -Continue surveillance.  -due to hot flushes, she stopped tamoxifen at the end of 2023 -Lab and f/u in one year

## 2023-04-15 ENCOUNTER — Inpatient Hospital Stay: Payer: Medicare Other | Attending: Family Medicine

## 2023-04-15 ENCOUNTER — Inpatient Hospital Stay: Payer: Medicare Other | Admitting: Hematology

## 2023-04-15 DIAGNOSIS — Z170 Estrogen receptor positive status [ER+]: Secondary | ICD-10-CM

## 2023-04-15 NOTE — Progress Notes (Deleted)
[facility]   Telephone:(336) [phone] Fax:([phone]) [phone]   Clinic Follow up Note   Patient Care Team: Laurann Montana, MD as PCP - General (Family Medicine) Claud Kelp, MD as Consulting Physician (General Surgery) Sharrell Ku, MD as Consulting Physician (Gastroenterology) Etter Sjogren, MD as Consulting Physician (Plastic Surgery) Karie Soda, MD as Consulting Physician (Colon and Rectal Surgery) Malachy Mood, MD as Attending Physician (Hematology and Oncology)  Date of Service:  04/15/2023  CHIEF COMPLAINT: f/u of left breast cancer.   CURRENT THERAPY:  Tamoxifen started in 11/2009, held 04/2011-08/2013, and restarted on 09/11/2013    ASSESSMENT: *** Victoria Delacruz is a 52 y.o. female with   No problem-specific Assessment & Plan notes found for this encounter.  ***   PLAN:    SUMMARY OF ONCOLOGIC HISTORY: Oncology History  Malignant neoplasm of female breast (HCC)  08/02/2003 Initial Diagnosis   MALIGNANT NEOPLASM OF BREAST UNSPECIFIED SITE. High-grade DCIS of Left breast; Tumor was 4.0 cm. Sentinel lymph nodes were negative.  ER, 24%; PR 2%.  Patient had saline implant.    08/02/2003 Surgery   L mastectomy with reconstruction Francina Ames   05/07/2010 Surgery   R port-a-cath placement   05/11/2010 Relapse/Recurrence   Invasive ductal carcinoma of L breast; ER: 99%, positive; PR: 53%, positive; Ki 67 (mib-1): 20%; hER@ neu by FISH without ampliication; ratio of her 2 CEP 17 was 1.17.    05/28/2010 Surgery   Local excision of the L invasive ductal carcinoma by Claud Kelp.   06/08/2010 - 08/10/2010 Chemotherapy   Completed 4 cycles of adjuvant chemotherapy with cytoxan and taxotere in combination with neulasta    09/08/2010 - 11/04/2010 Radiation Therapy   XRT was given to the left breast and chall wall consisting of 4780 in 26 fractions with an 1800 cGy boost in 9 fractions Dr Dayton Scrape.   11/04/2010 - 04/17/2011 Chemotherapy   Tamoxifen taken  sporadically and discontinued due to vaginal bleeding.    08/30/2011 Surgery   Laparoscopic assisted vaginal hysterectomy.   04/10/2012 Imaging   MRI of the brain normal.  No cause of the patient's headache identified.    12/17/2012 Imaging   CT scan of abdomen and pelvis showed no acute findings in the abdomen or pelvis.  There were felt to be hepatic hemangiomas present, mesuring 2.5 cm in the superior right heaptic lobe at the hepatic dome and a 7 mm lesion located anteriorly.    08/23/2013 Imaging   Digital diagnostic unilateral right mammogram showed no mamographic or sonographic evidence of malignancy, right breast.      09/11/2013 -  Anti-estrogen oral therapy   Tamoxifen 20 mg once daily, planning for 10 years.   11/26/2016 Imaging   MR Cervical Spine W WO Contrast IMPRESSION: Small left sided disc protrusion at C5-6 extending into the foramen. This could be a source of radiculopathy involving the left C6 nerve root. Diffusely abnormal bone marrow likely related to prior chemotherapy or anemia.   03/17/2017 Imaging   US Abdomen IMPRESSION: No acute or focal abnormality identified.    03/17/2017 Imaging   US Abdomen Complete  IMPRESSION: No acute or focal abnormality identified.   03/23/2018 Imaging   03/23/2018 Mammogram IMPRESSION: No evidence of malignancy.   09/12/2018 Surgery   She underwent breast reconstruction on 09/12/2018 with Dr Odis Luster      INTERVAL HISTORY: *** Victoria Delacruz is here for a follow up of left breast cancer.  She was last seen by me on 09/17/2022.  She presents to the clinic     All other systems were reviewed with the patient and are negative.  MEDICAL HISTORY:  Past Medical History:  Diagnosis Date   Anemia    Arthritis    Blood transfusion 05/17/11, 05/25/11   anemia   Breast cancer (HCC) 2004 and 2011   Depression    Dizziness    ECTOPIC PREGNANCY 1997 and 2011   x 2   Family history of breast cancer    grandmother    Fibroid    Fibromyalgia    Hemorrhoids 2020   Hypertension    Personal history of radiation therapy 2011    SURGICAL HISTORY: Past Surgical History:  Procedure Laterality Date   ABDOMINAL HYSTERECTOMY     AUGMENTATION MAMMAPLASTY Left    BREAST BIOPSY Left 07/05/2003   malignant   BREAST BIOPSY Left 2011   malignant   EVALUATION UNDER ANESTHESIA WITH HEMORRHOIDECTOMY N/A 05/17/2019   Procedure: HEMORRHOIDECTOMY, HEMORRHOID LIGATION/PEXY, ANORECTAL EXAM UNDER ANESTHESIA., REMOVAL ANAL CRYPT POLYP;  Surgeon: Karie Soda, MD;  Location: [facility];  Service: General;  Laterality: N/A;   LAPAROSCOPIC ASSISTED VAGINAL HYSTERECTOMY  08/30/2011   Procedure: LAPAROSCOPIC ASSISTED VAGINAL HYSTERECTOMY;  Surgeon: Tereso Newcomer, MD;  Location: [facility];  Service: Gynecology;  Laterality: N/A;   LAPAROSCOPY FOR ECTOPIC PREGNANCY     LAPAROSCOPY W/ MINI-LAPAROTOMY     MASTECTOMY Left 2001   complete with reconstruction x 3, no b/p punctures to left arm   RECONSTRUCTION BREAST IMMEDIATE / DELAYED W/ TISSUE EXPANDER  2004   REDUCTION MAMMAPLASTY Right     I have reviewed the social history and family history with the patient and they are unchanged from previous note.  ALLERGIES:  is allergic to citalopram hydrobromide, duloxetine hcl, fluoxetine, pregabalin, and venlafaxine.  MEDICATIONS:  Current Outpatient Medications  Medication Sig Dispense Refill   amLODipine (NORVASC) 5 MG tablet Take 1 tablet (5 mg total) by mouth daily. 90 tablet 3   aspirin-acetaminophen-caffeine (EXCEDRIN MIGRAINE) 250-250-65 MG per tablet Take 1 tablet by mouth every 6 (six) hours as needed for headache.     Cetirizine HCl 10 MG CAPS Take 1 capsule (10 mg total) by mouth at bedtime. 30 capsule 5   cholecalciferol (VITAMIN D) 1000 units tablet Take 5,000 Units by mouth daily.     cyclobenzaprine (FLEXERIL) 10 MG tablet Take 1 tablet (10 mg total) by mouth at bedtime. (Patient taking differently:  Take 10 mg by mouth at bedtime as needed.) 20 tablet 0   LORazepam (ATIVAN) 0.5 MG tablet      naproxen (NAPROSYN) 500 MG tablet      potassium chloride (KLOR-CON M) 10 MEQ tablet Take 1 tablet (10 mEq total) by mouth 2 (two) times daily. 30 tablet 1   propranolol (INDERAL) 20 MG tablet      SUMAtriptan (IMITREX) 100 MG tablet Take 1 tablet at earliest onset of headache.  May repeat once in 2 hours if headache persists or recurs. (Patient taking differently: Take 1 tablet at earliest onset of headache.  May repeat once in 2 hours if headache persists or recurs.) 10 tablet 2   tamoxifen (NOLVADEX) 20 MG tablet Take 1 tablet (20 mg total) by mouth daily. 90 tablet 3   topiramate (TOPAMAX) 50 MG tablet Take 1 tablet (50 mg total) by mouth at bedtime. 30 tablet 2   No current facility-administered medications for this visit.    PHYSICAL EXAMINATION: ECOG PERFORMANCE STATUS: {CHL ONC ECOG ZO:[id number]}  There were no vitals filed for this visit. Wt Readings from Last 3 Encounters:  09/17/22 163 lb 4.8 oz (74.1 kg)  03/02/22 164 lb 6.4 oz (74.6 kg)  12/13/21 169 lb (76.7 kg)    {Only keep what was examined. If exam not performed, can use .CEXAM } GENERAL:alert, no distress and comfortable SKIN: skin color, texture, turgor are normal, no rashes or significant lesions EYES: normal, Conjunctiva are pink and non-injected, sclera clear {OROPHARYNX:no exudate, no erythema and lips, buccal mucosa, and tongue normal}  NECK: supple, thyroid normal size, non-tender, without nodularity LYMPH:  no palpable lymphadenopathy in the cervical, axillary {or inguinal} LUNGS: clear to auscultation and percussion with normal breathing effort HEART: regular rate & rhythm and no murmurs and no lower extremity edema ABDOMEN:abdomen soft, non-tender and normal bowel sounds Musculoskeletal:no cyanosis of digits and no clubbing  NEURO: alert & oriented x 3 with fluent speech, no focal motor/sensory  deficits  LABORATORY DATA:  I have reviewed the data as listed    Latest Ref Rng & Units 03/02/2022   11:05 AM 12/13/2021    6:30 AM 08/21/2021    9:06 AM  CBC  WBC 4.0 - 10.5 K/uL 7.4  6.7  5.9   Hemoglobin 12.0 - 15.0 g/dL 16.1  09.6  04.5   Hematocrit 36.0 - 46.0 % 35.7  37.0  37.2   Platelets 150 - 400 K/uL 237  264  293         Latest Ref Rng & Units 03/02/2022   11:05 AM 12/13/2021    6:30 AM 08/21/2021    9:06 AM  CMP  Glucose 70 - 99 mg/dL 409  811  914   BUN 6 - 20 mg/dL 12  14  10    Creatinine 0.44 - 1.00 mg/dL 7.82  9.56  2.13   Sodium 135 - 145 mmol/L 141  140  140   Potassium 3.5 - 5.1 mmol/L 3.3  3.3  3.4   Chloride 98 - 111 mmol/L 107  105  109   CO2 22 - 32 mmol/L 29  25  25    Calcium 8.9 - 10.3 mg/dL 9.5  9.3  9.1   Total Protein 6.5 - 8.1 g/dL 7.5   8.1   Total Bilirubin 0.3 - 1.2 mg/dL 0.4   0.6   Alkaline Phos 38 - 126 U/L 80   86   AST 15 - 41 U/L 23   22   ALT 0 - 44 U/L 12   17       RADIOGRAPHIC STUDIES: I have personally reviewed the radiological images as listed and agreed with the findings in the report. No results found.    No orders of the defined types were placed in this encounter.  All questions were answered. The patient knows to call the clinic with any problems, questions or concerns. No barriers to learning was detected. The total time spent in the appointment was {CHL ONC TIME VISIT - YQMVH:[id number]}.     Salome Holmes, CMA 04/15/2023   I, Monica Martinez, CMA, am acting as scribe for Malachy Mood, MD.   {Add scribe attestation statement}

## 2023-06-30 ENCOUNTER — Inpatient Hospital Stay: Payer: Medicare Other | Attending: Family Medicine

## 2023-06-30 ENCOUNTER — Inpatient Hospital Stay: Payer: Medicare Other | Admitting: Nurse Practitioner

## 2023-06-30 VITALS — BP 127/88 | HR 67 | Temp 97.5°F | Resp 17 | Wt 164.3 lb

## 2023-06-30 DIAGNOSIS — Z9221 Personal history of antineoplastic chemotherapy: Secondary | ICD-10-CM | POA: Insufficient documentation

## 2023-06-30 DIAGNOSIS — Z170 Estrogen receptor positive status [ER+]: Secondary | ICD-10-CM | POA: Diagnosis not present

## 2023-06-30 DIAGNOSIS — Z9012 Acquired absence of left breast and nipple: Secondary | ICD-10-CM | POA: Insufficient documentation

## 2023-06-30 DIAGNOSIS — Z8639 Personal history of other endocrine, nutritional and metabolic disease: Secondary | ICD-10-CM | POA: Insufficient documentation

## 2023-06-30 DIAGNOSIS — Z853 Personal history of malignant neoplasm of breast: Secondary | ICD-10-CM | POA: Insufficient documentation

## 2023-06-30 DIAGNOSIS — C50412 Malignant neoplasm of upper-outer quadrant of left female breast: Secondary | ICD-10-CM | POA: Diagnosis not present

## 2023-06-30 DIAGNOSIS — Z923 Personal history of irradiation: Secondary | ICD-10-CM | POA: Insufficient documentation

## 2023-06-30 LAB — CBC WITH DIFFERENTIAL (CANCER CENTER ONLY)
Abs Immature Granulocytes: 0.01 10*3/uL (ref 0.00–0.07)
Basophils Absolute: 0 10*3/uL (ref 0.0–0.1)
Basophils Relative: 1 %
Eosinophils Absolute: 0.1 10*3/uL (ref 0.0–0.5)
Eosinophils Relative: 1 %
HCT: 36.3 % (ref 36.0–46.0)
Hemoglobin: 12.5 g/dL (ref 12.0–15.0)
Immature Granulocytes: 0 %
Lymphocytes Relative: 40 %
Lymphs Abs: 2.3 10*3/uL (ref 0.7–4.0)
MCH: 30.7 pg (ref 26.0–34.0)
MCHC: 34.4 g/dL (ref 30.0–36.0)
MCV: 89.2 fL (ref 80.0–100.0)
Monocytes Absolute: 0.3 10*3/uL (ref 0.1–1.0)
Monocytes Relative: 5 %
Neutro Abs: 3.1 10*3/uL (ref 1.7–7.7)
Neutrophils Relative %: 53 %
Platelet Count: 278 10*3/uL (ref 150–400)
RBC: 4.07 MIL/uL (ref 3.87–5.11)
RDW: 13.2 % (ref 11.5–15.5)
WBC Count: 5.8 10*3/uL (ref 4.0–10.5)
nRBC: 0 % (ref 0.0–0.2)

## 2023-06-30 LAB — CMP (CANCER CENTER ONLY)
ALT: 11 U/L (ref 0–44)
AST: 18 U/L (ref 15–41)
Albumin: 4 g/dL (ref 3.5–5.0)
Alkaline Phosphatase: 92 U/L (ref 38–126)
Anion gap: 5 (ref 5–15)
BUN: 12 mg/dL (ref 6–20)
CO2: 27 mmol/L (ref 22–32)
Calcium: 9.3 mg/dL (ref 8.9–10.3)
Chloride: 108 mmol/L (ref 98–111)
Creatinine: 0.95 mg/dL (ref 0.44–1.00)
GFR, Estimated: >60 mL/min (ref >60)
Glucose, Bld: 113 mg/dL — ABNORMAL HIGH (ref 70–99)
Potassium: 3.5 mmol/L (ref 3.5–5.1)
Sodium: 140 mmol/L (ref 135–145)
Total Bilirubin: 0.5 mg/dL (ref 0.3–1.2)
Total Protein: 7.7 g/dL (ref 6.5–8.1)

## 2023-06-30 LAB — VITAMIN D 25 HYDROXY (VIT D DEFICIENCY, FRACTURES): Vit D, 25-Hydroxy: 35.6 ng/mL (ref 30–100)

## 2023-06-30 NOTE — Assessment & Plan Note (Addendum)
1. Left breast invasive duct carcinoma (ER+,PR+,HER2 negative), pT1aN0M0, stage Ia diagnosed in 2011, with prior left DCIS in 2004 -s/p left mastectomy in 08/2003 for 4 cm high grade DCIS -Unfortunately she had left breast cancer recurrence in 05/2010, invasive. She was treated with local excision surgery, adjuvant chemo CT and adjuvant radiation. She underwent breast reconstruction in 09/12/18 with Dr Odis Luster.  -She declined genetics -She has been taking Tamoxifen since 2011 held due to vaginal bleeding and restarted in 07/2013. She did have hysterectomy.  -10/2022 - bilateral breast MRI was negative  -01/2023 - screening mammogram was negative  -she continues to take tamoxifen. She is reluctant to stop it.  She denies chest pain, chest pressure, or shortness of breath. She denies headaches or visual disturbances. She denies abdominal pain, nausea, vomiting, or changes in bowel or bladder habits.   She reports very intermittent left breast tenderness. Comes on suddenly, lasts an hour or two, then resolves on it's own. She reports no new lumps, skin, or palpable breast changes.  -MRI bilateral breasts has been ordered for 10/2023. Will contact patient with results when they are available.  -screening mammogram should be done 01/2024.  -she will stop tamoxifen when current prescription gone.

## 2023-06-30 NOTE — Progress Notes (Addendum)
Patient Care Team: Laurann Montana, MD as PCP - General (Family Medicine) Claud Kelp, MD as Consulting Physician (General Surgery) Sharrell Ku, MD as Consulting Physician (Gastroenterology) Etter Sjogren, MD as Consulting Physician (Plastic Surgery) Karie Soda, MD as Consulting Physician (Colon and Rectal Surgery) Malachy Mood, MD as Attending Physician (Hematology and Oncology)  Clinic Day:  06/30/2023  Referring physician: Laurann Montana, MD  ASSESSMENT & PLAN:   Assessment & Plan: Malignant neoplasm of female breast (HCC) 1. Left breast invasive duct carcinoma (ER+,PR+,HER2 negative), pT1aN0M0, stage Ia diagnosed in 2011, with prior left DCIS in 2004 -s/p left mastectomy in 08/2003 for 4 cm high grade DCIS -Unfortunately she had left breast cancer recurrence in 05/2010, invasive. She was treated with local excision surgery, adjuvant chemo CT and adjuvant radiation. She underwent breast reconstruction in 09/12/18 with Dr Odis Luster.  -She declined genetics -She has been taking Tamoxifen since 2011 held due to vaginal bleeding and restarted in 07/2013. She did have hysterectomy.  -10/2022 - bilateral breast MRI was negative  -01/2023 - screening mammogram was negative  -she continues to take tamoxifen. She is reluctant to stop it.  She denies chest pain, chest pressure, or shortness of breath. She denies headaches or visual disturbances. She denies abdominal pain, nausea, vomiting, or changes in bowel or bladder habits.   She reports very intermittent left breast tenderness. Comes on suddenly, lasts an hour or two, then resolves on it's own. She reports no new lumps, skin, or palpable breast changes.  -MRI bilateral breasts has been ordered for 10/2023. Will contact patient with results when they are available.  -screening mammogram should be done 01/2024.  -she will stop tamoxifen when current prescription gone.    Plan: Reviewed breast MRI results from 10/2022 which were negative.   Reviewed mammogram results from 01/2023 which were negative, Labs reviewed  -CBC showing WBC 5.8; Hgb 12.5; Hct 36.3; Plt 278; Anc 3.1 -CMP - K 3.5; glucose 113; BUN 12; Creatinine 0.95; eGFR >60; Ca 9.3; LFTs normal  Advised the patient it was recommended she stop tamoxifen after completing the current prescription. She has been on this medication for a bit more than 10 years. No data to suggest benefit from staying on the medication for longer than 10 years. She states that she plans to stop when she completes the current prescription.  -breast MRI to be scheduled in 10/2023. Order for this has been placed today.  -she should continue with screening mammograms yearly. The next one is due 01/2024.  -labs and follow up recommended in one year.    The patient understands the plans discussed today and is in agreement with them.  She knows to contact our office if she develops concerns prior to her next appointment.  I provided 30 minutes of face-to-face time during this encounter and > 50% was spent counseling as documented under my assessment and plan.    Carlean Jews, NP  [facility] [address] [place] [address] Dept: [phone] Dept Fax: [phone]   Orders Placed This Encounter  Procedures   MR BREAST BILATERAL W WO CONTRAST INC CAD    Standing Status:   Future    Standing Expiration Date:   06/29/2024    Order Specific Question:   If indicated for the ordered procedure, I authorize the administration of contrast media per Radiology protocol    Answer:   Yes    Order Specific Question:  What is the patient's sedation requirement?    Answer:   No Sedation    Order Specific Question:   Does the patient have a pacemaker or implanted devices?    Answer:   No    Order Specific Question:   Preferred imaging location?    Answer:   [facility] (table limit - 550 lbs)      CHIEF COMPLAINT:   CC: left breast invasive duct carcinoma in 2011 with prior DCIS of left breast 2004.   Current Treatment:  tamoxifen  I have reviewed the past medical history, past surgical history, social history and family history with the patient and they are unchanged from previous note.  ALLERGIES:  is allergic to citalopram hydrobromide, duloxetine hcl, fluoxetine, pregabalin, and venlafaxine.  MEDICATIONS:  Current Outpatient Medications  Medication Sig Dispense Refill   amLODipine (NORVASC) 5 MG tablet Take 1 tablet (5 mg total) by mouth daily. 90 tablet 3   aspirin-acetaminophen-caffeine (EXCEDRIN MIGRAINE) 250-250-65 MG per tablet Take 1 tablet by mouth every 6 (six) hours as needed for headache.     Cetirizine HCl 10 MG CAPS Take 1 capsule (10 mg total) by mouth at bedtime. 30 capsule 5   cholecalciferol (VITAMIN D) 1000 units tablet Take 5,000 Units by mouth daily.     cyclobenzaprine (FLEXERIL) 10 MG tablet Take 1 tablet (10 mg total) by mouth at bedtime. (Patient taking differently: Take 10 mg by mouth at bedtime as needed.) 20 tablet 0   LORazepam (ATIVAN) 0.5 MG tablet      naproxen (NAPROSYN) 500 MG tablet      potassium chloride (KLOR-CON M) 10 MEQ tablet Take 1 tablet (10 mEq total) by mouth 2 (two) times daily. 30 tablet 1   propranolol (INDERAL) 20 MG tablet      SUMAtriptan (IMITREX) 100 MG tablet Take 1 tablet at earliest onset of headache.  May repeat once in 2 hours if headache persists or recurs. (Patient taking differently: Take 1 tablet at earliest onset of headache.  May repeat once in 2 hours if headache persists or recurs.) 10 tablet 2   tamoxifen (NOLVADEX) 20 MG tablet Take 1 tablet (20 mg total) by mouth daily. 90 tablet 3   topiramate (TOPAMAX) 50 MG tablet Take 1 tablet (50 mg total) by mouth at bedtime. 30 tablet 2   No current facility-administered medications for this visit.    HISTORY OF PRESENT ILLNESS:   Oncology History  Malignant neoplasm of female breast  (HCC)  08/02/2003 Initial Diagnosis   MALIGNANT NEOPLASM OF BREAST UNSPECIFIED SITE. High-grade DCIS of Left breast; Tumor was 4.0 cm. Sentinel lymph nodes were negative.  ER, 24%; PR 2%.  Patient had saline implant.    08/02/2003 Surgery   L mastectomy with reconstruction Francina Ames   05/07/2010 Surgery   R port-a-cath placement   05/11/2010 Relapse/Recurrence   Invasive ductal carcinoma of L breast; ER: 99%, positive; PR: 53%, positive; Ki 67 (mib-1): 20%; hER@ neu by FISH without ampliication; ratio of her 2 CEP 17 was 1.17.    05/28/2010 Surgery   Local excision of the L invasive ductal carcinoma by Claud Kelp.   06/08/2010 - 08/10/2010 Chemotherapy   Completed 4 cycles of adjuvant chemotherapy with cytoxan and taxotere in combination with neulasta    09/08/2010 - 11/04/2010 Radiation Therapy   XRT was given to the left breast and chall wall consisting of 4780 in 26 fractions with an 1800 cGy boost in 9 fractions Dr Dayton Scrape.  11/04/2010 - 04/17/2011 Chemotherapy   Tamoxifen taken sporadically and discontinued due to vaginal bleeding.    08/30/2011 Surgery   Laparoscopic assisted vaginal hysterectomy.   04/10/2012 Imaging   MRI of the brain normal.  No cause of the patient's headache identified.    12/17/2012 Imaging   CT scan of abdomen and pelvis showed no acute findings in the abdomen or pelvis.  There were felt to be hepatic hemangiomas present, mesuring 2.5 cm in the superior right heaptic lobe at the hepatic dome and a 7 mm lesion located anteriorly.    08/23/2013 Imaging   Digital diagnostic unilateral right mammogram showed no mamographic or sonographic evidence of malignancy, right breast.      09/11/2013 -  Anti-estrogen oral therapy   Tamoxifen 20 mg once daily, planning for 10 years.   11/26/2016 Imaging   MR Cervical Spine W WO Contrast IMPRESSION: Small left sided disc protrusion at C5-6 extending into the foramen. This could be a source of radiculopathy involving the  left C6 nerve root. Diffusely abnormal bone marrow likely related to prior chemotherapy or anemia.   03/17/2017 Imaging   US Abdomen IMPRESSION: No acute or focal abnormality identified.    03/17/2017 Imaging   US Abdomen Complete  IMPRESSION: No acute or focal abnormality identified.   03/23/2018 Imaging   03/23/2018 Mammogram IMPRESSION: No evidence of malignancy.   09/12/2018 Surgery   She underwent breast reconstruction on 09/12/2018 with Dr Odis Luster   10/23/2022 Imaging    MRI Bilateral Breast IMPRESSION: Evidence of previous left mastectomy with retropectoral implant reconstruction. Otherwise, unremarkable bilateral breast MRI without evidence of malignancy.  RECOMMENDATION: Recommend continued annual bilateral screening 3D mammography as patient's next annual bilateral mammogram is due in April 2024. Consider continued annual high risk screening breast MRI.  The American Cancer Society recommends annual MRI and mammography in patients with an estimated lifetime risk of developing breast cancer greater than 20 - 25%, or who are known or suspected to be positive for the breast cancer gene.  BI-RADS CATEGORY  1: Negative.   02/17/2023 Mammogram   MM 3D mammogram  IMPRESSION: No mammographic evidence of malignancy. A result letter of this screening mammogram will be mailed directly to the patient.   RECOMMENDATION: Screening mammogram in one year.  (Code:SM-R-61M)  BI-RADS CATEGORY  1: Negative.       REVIEW OF SYSTEMS:   Constitutional: Denies fevers, chills or abnormal weight loss Eyes: Denies blurriness of vision Ears, nose, mouth, throat, and face: Denies mucositis or sore throat Respiratory: Denies cough, dyspnea or wheezes Cardiovascular: Denies palpitation, chest discomfort or lower extremity swelling Gastrointestinal:  Denies nausea, heartburn or change in bowel habits Skin: Denies abnormal skin rashes Lymphatics: Denies new lymphadenopathy or easy  bruising Neurological:Denies numbness, tingling or new weaknesses Behavioral/Psych: Mood is stable, no new changes  Breast : she does have intermittent left breast pain in upper inner quadrant of the breast. She states that pain is intermittent and sharp. Happens once in a while. Will last for a few hours and resolve on it's own.  All other systems were reviewed with the patient and are negative.   VITALS:   Today's Vitals   06/30/23 1033 06/30/23 1034  BP: 127/88   Pulse: 67   Resp: 17   Temp: (!) 97.5 F (36.4 C)   TempSrc: Oral   SpO2: 99%   Weight: 164 lb 5 oz (74.5 kg)   PainSc:  0-No pain   Body mass index is 29.11  kg/m.   Wt Readings from Last 3 Encounters:  06/30/23 164 lb 5 oz (74.5 kg)  09/17/22 163 lb 4.8 oz (74.1 kg)  03/02/22 164 lb 6.4 oz (74.6 kg)    Body mass index is 29.11 kg/m.  Performance status (ECOG): 0 - Asymptomatic  PHYSICAL EXAM:   Physical Exam Vitals and nursing note reviewed.  Constitutional:      Appearance: Normal appearance. She is well-developed.  HENT:     Head: Normocephalic and atraumatic.     Nose: Nose normal.     Mouth/Throat:     Mouth: Mucous membranes are moist.     Pharynx: Oropharynx is clear.  Eyes:     Extraocular Movements: Extraocular movements intact.     Conjunctiva/sclera: Conjunctivae normal.     Pupils: Pupils are equal, round, and reactive to light.  Neck:     Vascular: No carotid bruit.  Cardiovascular:     Rate and Rhythm: Normal rate and regular rhythm.     Pulses: Normal pulses.     Heart sounds: Normal heart sounds.  Pulmonary:     Effort: Pulmonary effort is normal.     Breath sounds: Normal breath sounds.  Chest:  Breasts:    Right: Normal. No swelling, bleeding, inverted nipple, mass, nipple discharge, skin change or tenderness.     Comments: Left breast reconstruction. Implant is intact. No lumps or masses are palpable at this time. Negative left axillary lymph node evaluation.  Abdominal:      Palpations: Abdomen is soft.  Musculoskeletal:        General: Normal range of motion.     Cervical back: Normal range of motion and neck supple.  Lymphadenopathy:     Cervical: No cervical adenopathy.     Upper Body:     Right upper body: No supraclavicular, axillary or pectoral adenopathy.     Left upper body: No supraclavicular, axillary or pectoral adenopathy.  Skin:    General: Skin is warm and dry.     Capillary Refill: Capillary refill takes less than 2 seconds.  Neurological:     General: No focal deficit present.     Mental Status: She is alert and oriented to person, place, and time.  Psychiatric:        Mood and Affect: Mood normal.        Behavior: Behavior normal.        Thought Content: Thought content normal.        Judgment: Judgment normal.     LABORATORY DATA:  I have reviewed the data as listed    Component Value Date/Time   NA 140 06/30/2023 1021   NA 140 06/22/2017 0815   K 3.5 06/30/2023 1021   K 3.5 06/22/2017 0815   CL 108 06/30/2023 1021   CL 109 (H) 02/20/2013 1044   CO2 27 06/30/2023 1021   CO2 26 06/22/2017 0815   GLUCOSE 113 (H) 06/30/2023 1021   GLUCOSE 123 06/22/2017 0815   GLUCOSE 101 (H) 02/20/2013 1044   BUN 12 06/30/2023 1021   BUN 9.3 06/22/2017 0815   CREATININE 0.95 06/30/2023 1021   CREATININE 0.9 06/22/2017 0815   CALCIUM 9.3 06/30/2023 1021   CALCIUM 9.3 06/22/2017 0815   PROT 7.7 06/30/2023 1021   PROT 7.6 06/22/2017 0815   ALBUMIN 4.0 06/30/2023 1021   ALBUMIN 3.5 06/22/2017 0815   AST 18 06/30/2023 1021   AST 19 06/22/2017 0815   ALT 11 06/30/2023 1021   ALT 11 06/22/2017  0815   ALKPHOS 92 06/30/2023 1021   ALKPHOS 85 06/22/2017 0815   BILITOT 0.5 06/30/2023 1021   BILITOT 0.75 06/22/2017 0815   GFRNONAA >60 06/30/2023 1021   GFRAA >60 02/08/2020 1031     Lab Results  Component Value Date   WBC 5.8 06/30/2023   NEUTROABS 3.1 06/30/2023   HGB 12.5 06/30/2023   HCT 36.3 06/30/2023   MCV 89.2 06/30/2023    PLT 278 06/30/2023      Chemistry      Component Value Date/Time   NA 140 06/30/2023 1021   NA 140 06/22/2017 0815   K 3.5 06/30/2023 1021   K 3.5 06/22/2017 0815   CL 108 06/30/2023 1021   CL 109 (H) 02/20/2013 1044   CO2 27 06/30/2023 1021   CO2 26 06/22/2017 0815   BUN 12 06/30/2023 1021   BUN 9.3 06/22/2017 0815   CREATININE 0.95 06/30/2023 1021   CREATININE 0.9 06/22/2017 0815      Component Value Date/Time   CALCIUM 9.3 06/30/2023 1021   CALCIUM 9.3 06/22/2017 0815   ALKPHOS 92 06/30/2023 1021   ALKPHOS 85 06/22/2017 0815   AST 18 06/30/2023 1021   AST 19 06/22/2017 0815   ALT 11 06/30/2023 1021   ALT 11 06/22/2017 0815   BILITOT 0.5 06/30/2023 1021   BILITOT 0.75 06/22/2017 0815       RADIOGRAPHIC STUDIES: I have personally reviewed the radiological images as listed and agreed with the findings in the report. No results found.  Addendum I have seen the patient, examined her. I agree with the assessment and and plan and have edited the notes.   Patsy Lager is clinically doing very well, lab reviewed, no concern for recurrence.  She has been taking tamoxifen for 10 to 11 years, I recommend her to stop.  We do not have data to support adjuvant tamoxifen beyond 10 years.  She will continue annual screening mammogram, and to follow-up with [place] annually.  All questions were answered.  Malachy Mood MD 06/30/2023

## 2023-07-06 DIAGNOSIS — Z23 Encounter for immunization: Secondary | ICD-10-CM | POA: Diagnosis not present

## 2023-07-06 DIAGNOSIS — G44209 Tension-type headache, unspecified, not intractable: Secondary | ICD-10-CM | POA: Diagnosis not present

## 2023-07-06 DIAGNOSIS — M797 Fibromyalgia: Secondary | ICD-10-CM | POA: Diagnosis not present

## 2023-07-06 DIAGNOSIS — I10 Essential (primary) hypertension: Secondary | ICD-10-CM | POA: Diagnosis not present

## 2023-07-06 DIAGNOSIS — G43109 Migraine with aura, not intractable, without status migrainosus: Secondary | ICD-10-CM | POA: Diagnosis not present

## 2023-07-06 DIAGNOSIS — M542 Cervicalgia: Secondary | ICD-10-CM | POA: Diagnosis not present

## 2023-07-19 ENCOUNTER — Encounter: Payer: Self-pay | Admitting: Neurology

## 2023-10-05 ENCOUNTER — Other Ambulatory Visit: Payer: Self-pay | Admitting: Family Medicine

## 2023-10-05 DIAGNOSIS — M7989 Other specified soft tissue disorders: Secondary | ICD-10-CM | POA: Diagnosis not present

## 2023-10-05 DIAGNOSIS — K219 Gastro-esophageal reflux disease without esophagitis: Secondary | ICD-10-CM | POA: Diagnosis not present

## 2023-10-05 DIAGNOSIS — M797 Fibromyalgia: Secondary | ICD-10-CM | POA: Diagnosis not present

## 2023-10-05 DIAGNOSIS — G43109 Migraine with aura, not intractable, without status migrainosus: Secondary | ICD-10-CM | POA: Diagnosis not present

## 2023-10-05 DIAGNOSIS — I10 Essential (primary) hypertension: Secondary | ICD-10-CM | POA: Diagnosis not present

## 2023-10-05 DIAGNOSIS — R7303 Prediabetes: Secondary | ICD-10-CM | POA: Diagnosis not present

## 2023-10-12 NOTE — Progress Notes (Unsigned)
NEUROLOGY CONSULTATION NOTE  Victoria Delacruz MRN: [id number] DOB: [birth year]  Referring provider: Laurann Montana, MD Primary care provider: Laurann Montana, MD  Reason for consult:  headaches  Assessment/Plan:   Chronic migraine without aura, without status migrainosus, not intractable - over 15 headache days a month for over 3 months, failed topiramate, venlafaxine, propranolol, duloxetine Bilateral occipital neuralgia Cervicalgia with left sided radiculopathy Hyperreflexia   Migraine prevention:  Plan to start Qulipta.  If not an option, Botox Migraine rescue:  Sumatriptan 100mg  (previously effective) Limit use of pain relievers to no more than 2 days out of week to prevent risk of rebound or medication-overuse headache. Keep headache diary Regarding neck pain and occipital neuralgia Check MRI of cervical spine as she does exhibit hyperreflexia and want to evaluate for spinal stenosis/myelopathy Suggested 3 treatment options and she will get back to me on her decision Gabapentin Physical therapy for the neck (would wait on MRI results first) Occipital nerve blocks Follow up 5 months (or sooner if we proceed with Botox)     Subjective:  Victoria Delacruz is a 52 year old left-handed woman with hypertension, fibromyalgia, and history of left breast invasive duct carcinoma on Tamoxifen, who presents for headaches.  History supplemented by referring provider's notes.  MRIs personally reviewed.   Headache 1:   Onset:  childhood Location:  Band-like, bilateral posterior, bilateral temporal Quality:  pressure Intensity:  "11"/10 Aura:  no Prodrome:  no Postdrome:  no Associated symptoms:  Photophobia, phonophobia, neck pain, sometimes nausea and vomiting.  She has not had any new worse headache of her life, waking up from sleep Duration:  several hours (sleeps it off) Frequency:  10-12 days a month Triggers/exacerbating factors:  stress Relieving factors:  Ice pack,  heating pad Activity:  Aggravates.  Cannot function 10 days out of the month  Headache 2: Onset:  2023 Location:  usually left occipital, sometimes right temporal/occipital Quality:  Shooting Duration:  6 seconds Frequency:  several times a week  Neck Pain: Has fairly persistent neck pain.  Left sided.  Has radicular pain down the left arm.    10/31/2019 MRI BRAIN W WO:  Normal MRI of the brain  11/26/2016 MRI C-SPINE WO:  Small left sided disc protrusion at C5-6 extending into the foramen.  This could be a source of radiculopathy involving the left C6 nerve root.  Small central disc protrusion at C4-5 to left of midline without cord deformity or stenosis.  C2-3 and C3-4 negative. 03/09/2017 CT MYELOGRAM C-SPINE:  Small disc protrusions from C4-C5 to C6-C7. Those at C4-C5 and C5-C6 are eccentric to the left, and a small C5-C6 struck back could the C6 neural foramen as demonstrated on the January MRI. Query left C6 radiculitis. Effaced ventral CSF space at these levels with nospinal stenosis.   Past NSAIDS:  Ibuprofen, meloxicam, ASA Past analgesics:  Tylenol Past abortive triptans:  sumatriptan Past muscle relaxants:  no Past anti-emetic:  no Past antihypertensive medications:  metoprolol Past antidepressant medications:  venlafaxine XR (caused weight loss), amitriptyline, citalopram, fluoxetine Past anticonvulsant medications:  topiramate, gabapentin, pregablin Past vitamins/Herbal/Supplements:  no Other past therapies:  no   Current NSAIDS:  naproxen Current analgesics:  Excedrin, Extra-strength Tylenol Current triptans:  none Current anti-emetic:  Zofran 4mg  Current muscle relaxants:  cyclobenzaprine 10mg  at bedtime PRN Current anti-anxiolytic:  no Current sleep aide:  no Current Antihypertensive medications:  propranolol 20mg  twice daily, amlodipine Current Antidepressant medications:  Cymbalta 60mg  Current Anticonvulsant medications:  none Current  Vitamins/Herbal/Supplements:  D3 Current Antihistamines/Decongestants:  Zyrtec Other therapy:  no Other medication:  Tamoxifen, ASA   Caffeine:  tea Alcohol:  no Smoker:  no Diet:  Increasing water Exercise:  no Depression/anxiety:  Increased: her husband has had two brain surgeries to remove tumors; relationship with her foster daughter Sleep hygiene:  poor Family history of headache:  No   MRI of cervical spine on 11/26/16 personally reviewed and showed small left-sided disc protrusion at C5-6. CT head from 01/11/17 was personally reviewed and was normal.     PAST MEDICAL HISTORY: Past Medical History:  Diagnosis Date   Anemia    Arthritis    Blood transfusion 05/17/11, 05/25/11   anemia   Breast cancer (HCC) 2004 and 2011   Depression    Dizziness    ECTOPIC PREGNANCY 1997 and 2011   x 2   Family history of breast cancer    grandmother   Fibroid    Fibromyalgia    Hemorrhoids 2020   Hypertension    Personal history of radiation therapy 2011    PAST SURGICAL HISTORY: Past Surgical History:  Procedure Laterality Date   ABDOMINAL HYSTERECTOMY     AUGMENTATION MAMMAPLASTY Left    BREAST BIOPSY Left 07/05/2003   malignant   BREAST BIOPSY Left 2011   malignant   EVALUATION UNDER ANESTHESIA WITH HEMORRHOIDECTOMY N/A 05/17/2019   Procedure: HEMORRHOIDECTOMY, HEMORRHOID LIGATION/PEXY, ANORECTAL EXAM UNDER ANESTHESIA., REMOVAL ANAL CRYPT POLYP;  Surgeon: Karie Soda, MD;  Location: [facility];  Service: General;  Laterality: N/A;   LAPAROSCOPIC ASSISTED VAGINAL HYSTERECTOMY  08/30/2011   Procedure: LAPAROSCOPIC ASSISTED VAGINAL HYSTERECTOMY;  Surgeon: Tereso Newcomer, MD;  Location: [facility];  Service: Gynecology;  Laterality: N/A;   LAPAROSCOPY FOR ECTOPIC PREGNANCY     LAPAROSCOPY W/ MINI-LAPAROTOMY     MASTECTOMY Left 2001   complete with reconstruction x 3, no b/p punctures to left arm   RECONSTRUCTION BREAST IMMEDIATE / DELAYED W/ TISSUE EXPANDER   2004   REDUCTION MAMMAPLASTY Right     MEDICATIONS: Current Outpatient Medications on File Prior to Visit  Medication Sig Dispense Refill   aspirin-acetaminophen-caffeine (EXCEDRIN MIGRAINE) 250-250-65 MG per tablet Take 1 tablet by mouth every 6 (six) hours as needed for headache.     Cetirizine HCl 10 MG CAPS Take 1 capsule (10 mg total) by mouth at bedtime. 30 capsule 5   cholecalciferol (VITAMIN D) 1000 units tablet Take 5,000 Units by mouth daily.     cyclobenzaprine (FLEXERIL) 10 MG tablet Take 1 tablet (10 mg total) by mouth at bedtime. (Patient taking differently: Take 10 mg by mouth at bedtime as needed.) 20 tablet 0   LORazepam (ATIVAN) 0.5 MG tablet      naproxen (NAPROSYN) 500 MG tablet      potassium chloride (KLOR-CON M) 10 MEQ tablet Take 1 tablet (10 mEq total) by mouth 2 (two) times daily. 30 tablet 1   propranolol (INDERAL) 20 MG tablet      amLODipine (NORVASC) 5 MG tablet Take 1 tablet (5 mg total) by mouth daily. 90 tablet 3   No current facility-administered medications on file prior to visit.     ALLERGIES: Allergies  Allergen Reactions   Citalopram Hydrobromide     Other reaction(s): not effective   Duloxetine Hcl Other (See Comments)   Fluoxetine     Other reaction(s): unable due to interaction for tamoxifen   Pregabalin     Other reaction(s): thoughts of death   Venlafaxine  Other reaction(s): headaches, weight loss    FAMILY HISTORY: Family History  Problem Relation Age of Onset   Hypertension Mother    Chronic bronchitis Mother    Hypertension Father        History of blood clots in lungs   COPD Father    Lymphoma Sister        Hodgkins   Cancer Sister        lung   Cancer Maternal Grandmother        breast   Breast cancer Maternal Grandmother    Cancer Maternal Grandfather        lung   Cancer Maternal Aunt        breast    Pulmonary fibrosis Maternal Aunt    Cancer Maternal Uncle        prostate/ bone     Objective:  Blood  pressure 139/80, pulse 64, last menstrual period 04/23/2011, SpO2 98%. General: No acute distress.  Patient appears well-groomed.   Head:  Normocephalic/atraumatic, left worse than right suboccipital tenderness Eyes:  fundi examined but not visualized Neck: supple, left sided paraspinal tenderness, reduced range of motion with head turn to left. Heart: regular rate and rhythm Neurological Exam: Mental status: alert and oriented to person, place, and time, speech fluent and not dysarthric, language intact. Cranial nerves: CN I: not tested CN II: pupils equal, round and reactive to light, visual fields intact CN III, IV, VI:  full range of motion, no nystagmus, no ptosis CN V: mild hyperesthesia of left V1-V2 CN VII: upper and lower face symmetric CN VIII: hearing intact CN IX, X: gag intact, uvula midline CN XI: sternocleidomastoid and trapezius muscles intact CN XII: tongue midline Bulk & Tone: normal, no fasciculations. Motor:  muscle strength 5-/5 left shoulder abduction and triceps, which may be limited due to pain, otherwise 5/5 throughout Sensation:  Pinprick sensation reduced in left dorsum of head, arm and thumb.  Vibratory sensation intact. Deep Tendon Reflexes:  3+ upper extremities > patellars, otherwise 5/5 ankles.  Hoffman sign absent, Babinski sign absent Finger to nose testing:  Without dysmetria.     Gait:  Normal station and stride.  Romberg negative.    Thank you for allowing me to take part in the care of this patient.  Shon Millet, DO  CC: Laurann Montana, MD

## 2023-10-13 ENCOUNTER — Encounter: Payer: Self-pay | Admitting: Neurology

## 2023-10-13 ENCOUNTER — Ambulatory Visit: Payer: Medicare Other | Admitting: Neurology

## 2023-10-13 VITALS — BP 139/80 | HR 64

## 2023-10-13 DIAGNOSIS — R292 Abnormal reflex: Secondary | ICD-10-CM

## 2023-10-13 DIAGNOSIS — M5481 Occipital neuralgia: Secondary | ICD-10-CM

## 2023-10-13 DIAGNOSIS — M5412 Radiculopathy, cervical region: Secondary | ICD-10-CM

## 2023-10-13 DIAGNOSIS — M542 Cervicalgia: Secondary | ICD-10-CM

## 2023-10-13 DIAGNOSIS — G43709 Chronic migraine without aura, not intractable, without status migrainosus: Secondary | ICD-10-CM | POA: Diagnosis not present

## 2023-10-13 MED ORDER — SUMATRIPTAN SUCCINATE 100 MG PO TABS: ORAL_TABLET | ORAL | 5 refills | Status: AC

## 2023-10-13 MED ORDER — QULIPTA 60 MG PO TABS: 60.0000 mg | ORAL_TABLET | Freq: Every day | ORAL | 5 refills | Status: DC

## 2023-10-13 NOTE — Patient Instructions (Addendum)
  Start Qulipta 60mg  daily.  If this is not an option, plan would be for Botox.   Take sumatriptan 100mg  at earliest onset of headache.  May repeat dose once in 2 hours if needed.  Maximum 2 tablets in 24 hours. Limit use of pain relievers to no more than 2 days out of the week.  These medications include acetaminophen, NSAIDs (ibuprofen/Advil/Motrin, naproxen/Aleve, triptans (Imitrex/sumatriptan), Excedrin, and narcotics.  This will help reduce risk of rebound headaches. Be aware of common food triggers Routine exercise Stay adequately hydrated (aim for 64 oz water daily) Keep headache diary Maintain proper stress management Maintain proper sleep hygiene Do not skip meals Consider supplements:  magnesium citrate 400mg  daily, riboflavin 400mg  daily, coenzyme Q10 300mg  daily   For the shooting pain/neck pain First get MRI of cervical spine. Treatment options include: Gabapentin Physical therapy for the neck (wait on MRI first) Occipital nerve blocks (done in the office)

## 2023-10-14 ENCOUNTER — Telehealth: Payer: Self-pay | Admitting: Neurology

## 2023-10-14 NOTE — Telephone Encounter (Signed)
Pt called in stating she got a missed call from [place], but no message. Pt was thinking this might be about the MRI she is supposed to be doing tomorrow that was scheduled by a different provider. She was hoping she could get the one Dr. Everlena Cooper wants done at the same time.

## 2023-10-14 NOTE — Telephone Encounter (Signed)
No one called from our office. Patient advised she may call central scheduling to see if both MRI can be done this Saturday.

## 2023-10-15 ENCOUNTER — Ambulatory Visit (HOSPITAL_COMMUNITY)
Admission: RE | Admit: 2023-10-15 | Discharge: 2023-10-15 | Discharge status: Home / Self Care | Payer: Medicare Other | Source: Ambulatory Visit | Attending: Neurology

## 2023-10-15 ENCOUNTER — Ambulatory Visit (HOSPITAL_COMMUNITY)
Admission: RE | Admit: 2023-10-15 | Discharge: 2023-10-15 | Disposition: A | Discharge status: Home / Self Care | Payer: Medicare Other | Source: Ambulatory Visit | Attending: Nurse Practitioner | Admitting: Nurse Practitioner

## 2023-10-15 DIAGNOSIS — R92323 Mammographic fibroglandular density, bilateral breasts: Secondary | ICD-10-CM | POA: Diagnosis not present

## 2023-10-15 DIAGNOSIS — M50221 Other cervical disc displacement at C4-C5 level: Secondary | ICD-10-CM | POA: Diagnosis not present

## 2023-10-15 DIAGNOSIS — M5412 Radiculopathy, cervical region: Secondary | ICD-10-CM | POA: Insufficient documentation

## 2023-10-15 DIAGNOSIS — M5021 Other cervical disc displacement,  high cervical region: Secondary | ICD-10-CM | POA: Diagnosis not present

## 2023-10-15 DIAGNOSIS — R292 Abnormal reflex: Secondary | ICD-10-CM

## 2023-10-15 DIAGNOSIS — M542 Cervicalgia: Secondary | ICD-10-CM

## 2023-10-15 DIAGNOSIS — Z170 Estrogen receptor positive status [ER+]: Secondary | ICD-10-CM | POA: Insufficient documentation

## 2023-10-15 DIAGNOSIS — C50412 Malignant neoplasm of upper-outer quadrant of left female breast: Secondary | ICD-10-CM | POA: Diagnosis not present

## 2023-10-15 DIAGNOSIS — M50223 Other cervical disc displacement at C6-C7 level: Secondary | ICD-10-CM | POA: Diagnosis not present

## 2023-10-15 DIAGNOSIS — M4802 Spinal stenosis, cervical region: Secondary | ICD-10-CM | POA: Diagnosis not present

## 2023-10-15 MED ORDER — GADOBUTROL 1 MMOL/ML IV SOLN
7.0000 mL | Freq: Once | INTRAVENOUS | Status: AC | PRN
  Administered 2023-10-15: 7 mL via INTRAVENOUS

## 2023-10-17 ENCOUNTER — Ambulatory Visit
Admission: RE | Admit: 2023-10-17 | Discharge: 2023-10-17 | Disposition: A | Discharge status: Home / Self Care | Payer: Medicare Other | Source: Ambulatory Visit | Attending: Family Medicine | Admitting: Family Medicine

## 2023-10-17 DIAGNOSIS — M7989 Other specified soft tissue disorders: Secondary | ICD-10-CM

## 2023-10-21 ENCOUNTER — Telehealth: Payer: Self-pay | Admitting: Neurology

## 2023-10-21 MED ORDER — GABAPENTIN 100 MG PO CAPS: 100.0000 mg | ORAL_CAPSULE | Freq: Three times a day (TID) | ORAL | 5 refills | Status: DC

## 2023-10-21 NOTE — Telephone Encounter (Signed)
Tried calling patient no answer. LMOVM to call the office back.

## 2023-10-21 NOTE — Telephone Encounter (Signed)
Please let patient know that I sent a prescription for gabapentin to the [facility] on [address].  She reported that the Bennie Pierini is too expensive because her insurance will not cover it.  Can we check on the prior authorization?

## 2023-10-24 ENCOUNTER — Ambulatory Visit: Payer: Medicare Other | Admitting: Neurology

## 2023-10-27 ENCOUNTER — Telehealth: Payer: Self-pay

## 2023-10-27 MED ORDER — GABAPENTIN 100 MG PO CAPS: 100.0000 mg | ORAL_CAPSULE | Freq: Three times a day (TID) | ORAL | 5 refills | Status: DC

## 2023-10-27 NOTE — Telephone Encounter (Signed)
-----   Message from Nurse Renee T sent at 10/20/2023  1:20 PM EST ----- Called pt and informed her of Dr. Rosanne Sack results. Per pt. She would like to start on the gabapentin. Also the Qulipta that was subscripted to her , she can't afford due to insurance does not cover it. The cost out of pocket is 1300 a month.

## 2023-10-28 ENCOUNTER — Telehealth: Payer: Self-pay | Admitting: Pharmacy Technician

## 2023-10-28 NOTE — Telephone Encounter (Signed)
PA has been submitted, and telephone encounter has been created. 

## 2023-10-28 NOTE — Telephone Encounter (Signed)
Pharmacy Patient Advocate Encounter   Received notification from Pt Calls Messages that prior authorization for QULIPTA 60MG  is required/requested.   Insurance verification completed.   The patient is insured through [facility] .   Per test claim: PA required; PA submitted to above mentioned insurance via CoverMyMeds Key/confirmation #/EOC [id number] Status is pending

## 2023-11-01 ENCOUNTER — Other Ambulatory Visit (HOSPITAL_COMMUNITY): Payer: Self-pay

## 2023-11-01 NOTE — Telephone Encounter (Signed)
 Pharmacy Patient Advocate Encounter  Received notification from [employer] Part D that Prior Authorization for Qulipta  60MG  tablets has been APPROVED from 10-28-2023 to 10-31-2024. Ran test claim, Copay is $362.60 for quantity of 30 tablets per 30 days. This test claim was processed through [facility]- copay amounts may vary at other pharmacies due to pharmacy/plan contracts, or as the patient moves through the different stages of their insurance plan.   PA #/Case ID/Reference #: [id number]

## 2024-01-24 DIAGNOSIS — N390 Urinary tract infection, site not specified: Secondary | ICD-10-CM | POA: Diagnosis not present

## 2024-02-13 DIAGNOSIS — N3000 Acute cystitis without hematuria: Secondary | ICD-10-CM | POA: Diagnosis not present

## 2024-02-13 DIAGNOSIS — R1011 Right upper quadrant pain: Secondary | ICD-10-CM | POA: Diagnosis not present

## 2024-02-13 DIAGNOSIS — S39012A Strain of muscle, fascia and tendon of lower back, initial encounter: Secondary | ICD-10-CM | POA: Diagnosis not present

## 2024-03-14 NOTE — Progress Notes (Unsigned)
 NEUROLOGY FOLLOW UP OFFICE NOTE  Victoria Delacruz [id number]  Assessment/Plan:   Chronic migraine without aura, without status migrainosus, not intractable - over 15 headache days a month for over 3 months, failed topiramate , venlafaxine , propranolol , duloxetine Bilateral occipital neuralgia Cervicalgia with left sided radiculopathy   Migraine prevention:  Plan to start Botox *** Migraine rescue:  Sumatriptan  100mg  *** Limit use of pain relievers to no more than 2 days out of week to prevent risk of rebound or medication-overuse headache. Keep headache diary Regarding neck pain/cervical radiculopathy and occipital neuralgia:  *** gabapentin  *** Follow up ***     Subjective:  Victoria Delacruz is a 53 year old left-handed woman with hypertension, fibromyalgia, and history of left breast invasive duct carcinoma on Tamoxifen , who follows up for headaches.  MRI of C-spine personally reviewed.  UPDATE: Migraine: Plan was to start Qulipta , but she could not afford it. Intensity:  *** Duration:  *** Frequency:  ***  Occipital neuralgia/Neck pain: MRI of C-spine without contrast on 10/15/2023 demonstrated:  1. Normal MRI appearance of the cervical spinal cord. 2. Left eccentric disc osteophyte complex at C5-6 with resultant moderate left C6 foraminal stenosis. 3. Additional mild noncompressive disc bulging at C3-4 through C6-7 without significant stenosis or neural impingement.  Started gabapentin . ***  Frequency of abortive medication: *** Current NSAIDS:  naproxen  Current analgesics:  Excedrin, Extra-strength Tylenol  Current triptans:  sumatriptan  100mg  Current anti-emetic:  Zofran  4mg  Current muscle relaxants:  cyclobenzaprine  10mg  at bedtime PRN Current anti-anxiolytic:  no Current sleep aide:  no Current Antihypertensive medications:  propranolol  20mg  twice daily, amlodipine  Current Antidepressant medications:  Cymbalta 60mg  Current Anticonvulsant medications:   gabapentin  100mg  three times daily Current CGRP inhibitor:  none Current Vitamins/Herbal/Supplements:  D3 Current Antihistamines/Decongestants:  Zyrtec  Other therapy:  no Other medication:  Tamoxifen , ASA   Caffeine:  tea Alcohol:  no Smoker:  no Diet:  Increasing water Exercise:  no Depression/anxiety:  Increased: her husband has had two brain surgeries to remove tumors; relationship with her foster daughter Sleep hygiene:  poor  HISTORY:  Migraine Without Aura:   Onset:  childhood Location:  Band-like, bilateral posterior, bilateral temporal Quality:  pressure Intensity:  "11"/10 Aura:  no Prodrome:  no Postdrome:  no Associated symptoms:  Photophobia, phonophobia, neck pain, sometimes nausea and vomiting.  She has not had any new worse headache of her life, waking up from sleep Duration:  several hours (sleeps it off) Frequency:  10-12 days a month Triggers/exacerbating factors:  stress Relieving factors:  Ice pack, heating pad Activity:  Aggravates.  Cannot function 10 days out of the month  Bilateral Occipital Neuralgia: Onset:  2023 Location:  usually left occipital, sometimes right temporal/occipital Quality:  Shooting Duration:  6 seconds Frequency:  several times a week  Neck Pain: Has fairly persistent neck pain.  Left sided.  Has radicular pain down the left arm.    10/31/2019 MRI BRAIN W WO:  Normal MRI of the brain  11/26/2016 MRI C-SPINE WO:  Small left sided disc protrusion at C5-6 extending into the foramen.  This could be a source of radiculopathy involving the left C6 nerve root.  Small central disc protrusion at C4-5 to left of midline without cord deformity or stenosis.  C2-3 and C3-4 negative. 03/09/2017 CT MYELOGRAM C-SPINE:  Small disc protrusions from C4-C5 to C6-C7. Those at C4-C5 and C5-C6 are eccentric to the left, and a small C5-C6 struck back could the C6 neural foramen as demonstrated on the January  MRI. Query left C6 radiculitis. Effaced  ventral CSF space at these levels with nospinal stenosis.   Past NSAIDS:  Ibuprofen , meloxicam, ASA Past analgesics:  Tylenol  Past abortive triptans:  sumatriptan  Past muscle relaxants:  no Past anti-emetic:  no Past antihypertensive medications:  metoprolol Past antidepressant medications:  venlafaxine  XR (caused weight loss), amitriptyline , citalopram , fluoxetine  Past anticonvulsant medications:  topiramate , gabapentin , pregablin Past vitamins/Herbal/Supplements:  no Other past therapies:  no    Family history of headache:  No   MRI of cervical spine on 11/26/16 personally reviewed and showed small left-sided disc protrusion at C5-6. CT head from 01/11/17 was personally reviewed and was normal.  PAST MEDICAL HISTORY: Past Medical History:  Diagnosis Date   Anemia    Arthritis    Blood transfusion 05/17/11, 05/25/11   anemia   Breast cancer (HCC) 2004 and 2011   Depression    Dizziness    ECTOPIC PREGNANCY 1997 and 2011   x 2   Family history of breast cancer    grandmother   Fibroid    Fibromyalgia    Hemorrhoids 2020   Hypertension    Personal history of radiation therapy 2011    MEDICATIONS: Current Outpatient Medications on File Prior to Visit  Medication Sig Dispense Refill   amLODipine  (NORVASC ) 5 MG tablet Take 1 tablet (5 mg total) by mouth daily. 90 tablet 3   aspirin -acetaminophen -caffeine (EXCEDRIN MIGRAINE) 250-250-65 MG per tablet Take 1 tablet by mouth every 6 (six) hours as needed for headache.     Atogepant  (QULIPTA ) 60 MG TABS Take 1 tablet (60 mg total) by mouth daily. 30 tablet 5   Cetirizine  HCl 10 MG CAPS Take 1 capsule (10 mg total) by mouth at bedtime. 30 capsule 5   cholecalciferol  (VITAMIN D ) 1000 units tablet Take 5,000 Units by mouth daily.     cyclobenzaprine  (FLEXERIL ) 10 MG tablet Take 1 tablet (10 mg total) by mouth at bedtime. (Patient taking differently: Take 10 mg by mouth at bedtime as needed.) 20 tablet 0   gabapentin  (NEURONTIN ) 100  MG capsule Take 1 capsule (100 mg total) by mouth 3 (three) times daily. 90 capsule 5   LORazepam  (ATIVAN ) 0.5 MG tablet      naproxen  (NAPROSYN ) 500 MG tablet      potassium chloride  (KLOR-CON  M) 10 MEQ tablet Take 1 tablet (10 mEq total) by mouth 2 (two) times daily. 30 tablet 1   propranolol  (INDERAL ) 20 MG tablet      SUMAtriptan  (IMITREX ) 100 MG tablet Take 1 tablet at earliest onset of migraine.  May repeat in 2 hours if headache persists or recurs.  Maximum 2 tablets in 24 hours. 10 tablet 5   No current facility-administered medications on file prior to visit.    ALLERGIES: Allergies  Allergen Reactions   Citalopram  Hydrobromide     Other reaction(s): not effective   Duloxetine Hcl Other (See Comments)   Fluoxetine      Other reaction(s): unable due to interaction for tamoxifen    Pregabalin      Other reaction(s): thoughts of death   Venlafaxine      Other reaction(s): headaches, weight loss    FAMILY HISTORY: Family History  Problem Relation Age of Onset   Hypertension Mother    Chronic bronchitis Mother    Hypertension Father        History of blood clots in lungs   COPD Father    Lymphoma Sister        Hodgkins   Cancer Sister  lung   Cancer Maternal Aunt        breast    Pulmonary fibrosis Maternal Aunt    Multiple sclerosis Maternal Uncle    Cancer Maternal Uncle        prostate/ bone    Stroke Maternal Uncle    Multiple sclerosis Maternal Uncle    Cancer Maternal Grandmother        breast   Breast cancer Maternal Grandmother    Cancer Maternal Grandfather        lung   Multiple sclerosis Cousin       Objective:  *** General: No acute distress.  Patient appears ***-groomed.   Head:  Normocephalic/atraumatic Eyes:  Fundi examined but not visualized Neck: supple, no paraspinal tenderness, full range of motion Heart:  Regular rate and rhythm Lungs:  Clear to auscultation bilaterally Back: No paraspinal tenderness Neurological Exam: alert and  oriented.  Speech fluent and not dysarthric, language intact.  CN II-XII intact. Bulk and tone normal, muscle strength 5/5 throughout.  Sensation to light touch intact.  Deep tendon reflexes 2+ throughout, toes downgoing.  Finger to nose testing intact.  Gait normal, Romberg negative.   Janne Members, DO  CC: ***

## 2024-03-15 ENCOUNTER — Encounter: Payer: Self-pay | Admitting: Neurology

## 2024-03-15 ENCOUNTER — Ambulatory Visit: Payer: Medicare Other | Admitting: Neurology

## 2024-03-15 VITALS — BP 127/88 | HR 89 | Ht 63.0 in | Wt 164.0 lb

## 2024-03-15 DIAGNOSIS — M5412 Radiculopathy, cervical region: Secondary | ICD-10-CM

## 2024-03-15 DIAGNOSIS — M542 Cervicalgia: Secondary | ICD-10-CM

## 2024-03-15 DIAGNOSIS — G43709 Chronic migraine without aura, not intractable, without status migrainosus: Secondary | ICD-10-CM | POA: Diagnosis not present

## 2024-03-15 NOTE — Patient Instructions (Addendum)
 Refer to Dr. Cabbell for neck pain, cervical radiculopathy Continue sumatriptan  and gabapentin  Follow up with me in 6 months

## 2024-03-28 DIAGNOSIS — J029 Acute pharyngitis, unspecified: Secondary | ICD-10-CM | POA: Diagnosis not present

## 2024-04-12 ENCOUNTER — Encounter (INDEPENDENT_AMBULATORY_CARE_PROVIDER_SITE_OTHER): Payer: Self-pay

## 2024-05-09 ENCOUNTER — Telehealth: Payer: Self-pay | Admitting: Nurse Practitioner

## 2024-05-09 NOTE — Telephone Encounter (Signed)
 Called to reschedule patient appointment to due provider pal  request. I talked  to patient and they are aware of the changes that was made to the upcoming appointment

## 2024-06-11 ENCOUNTER — Encounter (INDEPENDENT_AMBULATORY_CARE_PROVIDER_SITE_OTHER): Payer: Self-pay | Admitting: Otolaryngology

## 2024-06-11 ENCOUNTER — Ambulatory Visit (INDEPENDENT_AMBULATORY_CARE_PROVIDER_SITE_OTHER): Admitting: Otolaryngology

## 2024-06-11 VITALS — BP 123/77 | HR 76

## 2024-06-11 DIAGNOSIS — J342 Deviated nasal septum: Secondary | ICD-10-CM | POA: Diagnosis not present

## 2024-06-11 DIAGNOSIS — K219 Gastro-esophageal reflux disease without esophagitis: Secondary | ICD-10-CM

## 2024-06-11 DIAGNOSIS — J343 Hypertrophy of nasal turbinates: Secondary | ICD-10-CM

## 2024-06-11 DIAGNOSIS — R0981 Nasal congestion: Secondary | ICD-10-CM | POA: Diagnosis not present

## 2024-06-11 DIAGNOSIS — R0982 Postnasal drip: Secondary | ICD-10-CM

## 2024-06-11 DIAGNOSIS — J3089 Other allergic rhinitis: Secondary | ICD-10-CM

## 2024-06-11 DIAGNOSIS — J312 Chronic pharyngitis: Secondary | ICD-10-CM | POA: Diagnosis not present

## 2024-06-11 MED ORDER — FLUTICASONE PROPIONATE 50 MCG/ACT NA SUSP: 2.0000 | Freq: Every day | NASAL | 6 refills | Status: AC

## 2024-06-11 MED ORDER — LEVOCETIRIZINE DIHYDROCHLORIDE 5 MG PO TABS: 5.0000 mg | ORAL_TABLET | Freq: Every evening | ORAL | 3 refills | Status: AC

## 2024-06-11 NOTE — Patient Instructions (Signed)

## 2024-06-11 NOTE — Progress Notes (Signed)
 ENT CONSULT:  Reason for Consult: chronic sore throat    HPI: Discussed the use of AI scribe software for clinical note transcription with the patient, who gave verbal consent to proceed.  History of Present Illness Victoria Delacruz is a 53 year old female who presents with a chronic sore throat since 2020.  She has experienced a persistent sore throat since 2020. Despite multiple negative COVID-19 tests, the sore throat has continued. She has been on medication for reflux since 2020, but the sore throat persists. No heartburn symptoms are present.   She experiences postnasal drainage and has a history of sinus issues. She is not currently on any nasal sprays or allergy medications but is taking penicillin following the extraction of her top wisdom teeth on the right, which involved surgery with buccal fat graft on the right side approximately two and a half weeks ago. She is not currently using allergy pills.  She has a history of migraines and was referred to [facility] for a pinched nerve and bulging discs in her neck, but has not been contacted for follow-up.   Records Reviewed:  Dr Skeet 03/15/24 Chronic migraine without aura, without status migrainosus, not intractable - over 15 headache days a month for over 3 months, failed topiramate , venlafaxine , propranolol , duloxetine Bilateral occipital neuralgia Cervicalgia with left sided radiculopathy     At this time, she would like to hold off on primary treatment for headaches and address neck pain to see if that will help.  She would like referral back to her previous neurosurgeon, Dr. Gillie.  In meantime, continue gabapentin  100mg  twice daily. Migraine rescue:  Sumatriptan  100mg   Limit use of pain relievers to no more than 2 days out of week to prevent risk of rebound or medication-overuse headache. Keep headache diary Follow up 6 months.    Past Medical History:  Diagnosis Date   Anemia    Arthritis    Blood  transfusion 05/17/11, 05/25/11   anemia   Breast cancer (HCC) 2004 and 2011   Depression    Dizziness    ECTOPIC PREGNANCY 1997 and 2011   x 2   Family history of breast cancer    grandmother   Fibroid    Fibromyalgia    Hemorrhoids 2020   Hypertension    Personal history of radiation therapy 2011    Past Surgical History:  Procedure Laterality Date   ABDOMINAL HYSTERECTOMY     AUGMENTATION MAMMAPLASTY Left    BREAST BIOPSY Left 07/05/2003   malignant   BREAST BIOPSY Left 2011   malignant   EVALUATION UNDER ANESTHESIA WITH HEMORRHOIDECTOMY N/A 05/17/2019   Procedure: HEMORRHOIDECTOMY, HEMORRHOID LIGATION/PEXY, ANORECTAL EXAM UNDER ANESTHESIA., REMOVAL ANAL CRYPT POLYP;  Surgeon: Sheldon Standing, MD;  Location:  SURGERY CENTER;  Service: General;  Laterality: N/A;   LAPAROSCOPIC ASSISTED VAGINAL HYSTERECTOMY  08/30/2011   Procedure: LAPAROSCOPIC ASSISTED VAGINAL HYSTERECTOMY;  Surgeon: Gloris DELENA Hugger, MD;  Location: [facility];  Service: Gynecology;  Laterality: N/A;   LAPAROSCOPY FOR ECTOPIC PREGNANCY     LAPAROSCOPY W/ MINI-LAPAROTOMY     MASTECTOMY Left 2001   complete with reconstruction x 3, no b/p punctures to left arm   RECONSTRUCTION BREAST IMMEDIATE / DELAYED W/ TISSUE EXPANDER  2004   REDUCTION MAMMAPLASTY Right     Family History  Problem Relation Age of Onset   Hypertension Mother    Chronic bronchitis Mother    Hypertension Father        History of blood  clots in lungs   COPD Father    Lymphoma Sister        Hodgkins   Cancer Sister        lung   Cancer Maternal Aunt        breast    Pulmonary fibrosis Maternal Aunt    Multiple sclerosis Maternal Uncle    Cancer Maternal Uncle        prostate/ bone    Stroke Maternal Uncle    Multiple sclerosis Maternal Uncle    Cancer Maternal Grandmother        breast   Breast cancer Maternal Grandmother    Cancer Maternal Grandfather        lung   Multiple sclerosis Cousin     Social History:  reports  that she has never smoked. She has never used smokeless tobacco. She reports that she does not drink alcohol and does not use drugs.  Allergies:  Allergies  Allergen Reactions   Citalopram  Hydrobromide     Other reaction(s): not effective   Duloxetine Hcl Other (See Comments)   Fluoxetine      Other reaction(s): unable due to interaction for tamoxifen    Pregabalin      Other reaction(s): thoughts of death   Venlafaxine      Other reaction(s): headaches, weight loss    Medications: I have reviewed the patient's current medications.  The PMH, PSH, Medications, Allergies, and SH were reviewed and updated.  ROS: Constitutional: Negative for fever, weight loss and weight gain. Cardiovascular: Negative for chest pain and dyspnea on exertion. Respiratory: Is not experiencing shortness of breath at rest. Gastrointestinal: Negative for nausea and vomiting. Neurological: Negative for headaches. Psychiatric: The patient is not nervous/anxious  Blood pressure 123/77, pulse 76, last menstrual period 04/23/2011, SpO2 96%. There is no height or weight on file to calculate BMI.  PHYSICAL EXAM:  Exam: General: Well-developed, well-nourished Respiratory Respiratory effort: Equal inspiration and expiration without stridor Cardiovascular Peripheral Vascular: Warm extremities with equal color/perfusion Eyes: No nystagmus with equal extraocular motion bilaterally Neuro/Psych/Balance: Patient oriented to person, place, and time; Appropriate mood and affect; Gait is intact with no imbalance; Cranial nerves I-XII are intact Head and Face Inspection: Normocephalic and atraumatic without mass or lesion Palpation: Facial skeleton intact without bony stepoffs Salivary Glands: No mass or tenderness Facial Strength: Facial motility symmetric and full bilaterally ENT Pinna: External ear intact and fully developed External canal: Canal is patent with intact skin Tympanic Membrane: Clear and  mobile External Nose: No scar or anatomic deformity Internal Nose: Septum is S-shaped and deviated. No polyp, or purulence. Mucosal edema and erythema present.  Bilateral inferior turbinate hypertrophy.  Lips, Teeth, and gums: Mucosa and teeth intact and viable TMJ: No pain to palpation with full mobility Oral cavity/oropharynx: No erythema or exudate, no lesions present Nasopharynx: No mass or lesion with intact mucosa Hypopharynx: Intact mucosa without pooling of secretions Larynx Glottic: Full true vocal cord mobility without lesion or mass Supraglottic: Normal appearing epiglottis and AE folds Interarytenoid Space: Moderate pachydermia&edema Subglottic Space: Patent without lesion or edema Neck Neck and Trachea: Midline trachea without mass or lesion Thyroid : No mass or nodularity Lymphatics: No lymphadenopathy  Procedure: Preoperative diagnosis: chronic sore throat  Postoperative diagnosis:   Same + GERD LPR  Procedure: Flexible fiberoptic laryngoscopy  Surgeon: Elena Larry, MD  Anesthesia: Topical lidocaine  and Afrin Complications: None Condition is stable throughout exam  Indications and consent:  The patient presents to the clinic with above symptoms. Indirect laryngoscopy view was  incomplete. Thus it was recommended that they undergo a flexible fiberoptic laryngoscopy. All of the risks, benefits, and potential complications were reviewed with the patient preoperatively and verbal informed consent was obtained.  Procedure: The patient was seated upright in the clinic. Topical lidocaine  and Afrin were applied to the nasal cavity. After adequate anesthesia had occurred, I then proceeded to pass the flexible telescope into the nasal cavity. The nasal cavity was patent without rhinorrhea or polyp. The nasopharynx was also patent without mass or lesion. The base of tongue was visualized and was normal. There were no signs of pooling of secretions in the piriform sinuses. The  true vocal folds were mobile bilaterally. There were no signs of glottic or supraglottic mucosal lesion or mass. There was moderate interarytenoid pachydermia and post cricoid edema. The telescope was then slowly withdrawn and the patient tolerated the procedure throughout.      Studies Reviewed: MR cervical spine 10/15/23 IMPRESSION: 1. Normal MRI appearance of the cervical spinal cord. 2. Left eccentric disc osteophyte complex at C5-6 with resultant moderate left C6 foraminal stenosis. 3. Additional mild noncompressive disc bulging at C3-4 through C6-7 without significant stenosis or neural impingement.  Assessment/Plan: Encounter Diagnoses  Name Primary?   Chronic sore throat Yes   Chronic GERD    Chronic nasal congestion    Environmental and seasonal allergies    Post-nasal drip    Hypertrophy of both inferior nasal turbinates    Nasal septal deviation     Assessment and Plan Assessment & Plan Chronic sore throat for 5 yrs Persistent sore throat likely due to chronic laryngopharyngeal reflux as her scope exam was overall unremarkable. Current medication may not fully prevent reflux. Symptoms suggest untreated reflux. - continue current reflux medication at the same dose  -  Reflux Gourmet after meals - diet and lifestyle changes to minimize GERD - Refer to [employer] blog for dietary and lifestyle modifications/reflux cook book. - Recommend gargling with salt water.  Chronic postnasal drip and nasal congestion Evidence of septal deviation and clear post-nasal drainage Sinus issues and nasal congestion without anatomical abnormalities. Not on nasal sprays or allergy medications. - Prescribed nasal spray Flonase  BID. - Prescribed Xyzal  5 mg QHS   Thank you for allowing me to participate in the care of this patient. Please do not hesitate to contact me with any questions or concerns.   Elena Larry, MD Otolaryngology [facility] Phone:  [phone] Fax: [phone]    06/11/2024, 11:25 AM

## 2024-06-21 ENCOUNTER — Other Ambulatory Visit: Payer: Self-pay

## 2024-06-21 DIAGNOSIS — Z170 Estrogen receptor positive status [ER+]: Secondary | ICD-10-CM

## 2024-06-21 NOTE — Assessment & Plan Note (Addendum)
 1. Left breast invasive duct carcinoma (ER+,PR+,HER2 negative), pT1aN0M0, stage Ia diagnosed in 2011, with prior left DCIS in 2004 -s/p left mastectomy in 08/2003 for 4 cm high grade DCIS -Unfortunately she had left breast cancer recurrence in 05/2010, invasive. She was treated with local excision surgery, adjuvant chemo CT and adjuvant radiation. She underwent breast reconstruction in 09/12/18 with Dr Leora.  -She declined genetics -She has been taking Tamoxifen  since 2011 held due to vaginal bleeding and restarted in 07/2013. She did have hysterectomy.  -10/2022 - bilateral breast MRI was negative  -01/2023 - screening mammogram was negative  -Discontinue use of tamoxifen .  She was more than 10 years post definitive surgery. -MRI bilateral breasts done on 10/15/2023 with benign results. -She is overdue for right unilateral screening mammogram.  This will be scheduled in the next 2 to 3 weeks. - Bilateral breast MRI ordered for December 2025. -Continue breast cancer surveillance. - Labs and follow-up in 1 year, sooner if needed.

## 2024-06-21 NOTE — Progress Notes (Signed)
 Patient Care Team: Teresa Channel, MD as PCP - General (Family Medicine) Gail Favorite, MD as Consulting Physician (General Surgery) Luis Purchase, MD as Consulting Physician (Gastroenterology) Leora Lenis, MD as Consulting Physician (Plastic Surgery) Sheldon Standing, MD as Consulting Physician (Colon and Rectal Surgery) Lanny Callander, MD as Attending Physician (Hematology and Oncology) Skeet Juliene SAUNDERS, DO as Consulting Physician (Neurology)  Clinic Day:  06/22/2024  Referring physician: Teresa Channel, MD  ASSESSMENT & PLAN:   Assessment & Plan: Malignant neoplasm of female breast (HCC) 1. Left breast invasive duct carcinoma (ER+,PR+,HER2 negative), pT1aN0M0, stage Ia diagnosed in 2011, with prior left DCIS in 2004 -s/p left mastectomy in 08/2003 for 4 cm high grade DCIS -Unfortunately she had left breast cancer recurrence in 05/2010, invasive. She was treated with local excision surgery, adjuvant chemo CT and adjuvant radiation. She underwent breast reconstruction in 09/12/18 with Dr Leora.  -She declined genetics -She has been taking Tamoxifen  since 2011 held due to vaginal bleeding and restarted in 07/2013. She did have hysterectomy.  -10/2022 - bilateral breast MRI was negative  -01/2023 - screening mammogram was negative  -Discontinue use of tamoxifen .  She was more than 10 years post definitive surgery. -MRI bilateral breasts done on 10/15/2023 with benign results. -She is overdue for right unilateral screening mammogram.  This will be scheduled in the next 2 to 3 weeks. - Bilateral breast MRI ordered for December 2025. -Continue breast cancer surveillance. - Labs and follow-up in 1 year, sooner if needed.   Plan Labs reviewed. -CBC and CMP are unremarkable. - Vitamin D  is normal at 56.72. Right unilateral screening mammogram ordered as part of today's visit. Bilateral breast MRI due in December 2025.  This was ordered as part of today's visit. Continue breast cancer  surveillance. Labs and follow-up in 1 year, sooner if needed.   The patient understands the plans discussed today and is in agreement with them.  She knows to contact our office if she develops concerns prior to her next appointment.  I provided 25 minutes of face-to-face time during this encounter and > 50% was spent counseling as documented under my assessment and plan.    Powell FORBES Lessen, NP  [facility] [address] [place] [address] Dept: [phone] Dept Fax: [phone]   Orders Placed This Encounter  Procedures   MM 3D SCREENING MAMMOGRAM UNILATERAL RIGHT BREAST    Standing Status:   Future    Expected Date:   06/29/2024    Expiration Date:   06/22/2025    Reason for Exam (SYMPTOM  OR DIAGNOSIS REQUIRED):   screen for breast cancer. history of left breast cancer X 2    Is the patient pregnant?:   No    Preferred imaging location?:   [facility]   MR BREAST BILATERAL W WO CONTRAST INC CAD    Standing Status:   Future    Expected Date:   10/16/2024    Expiration Date:   06/22/2025    If indicated for the ordered procedure, I authorize the administration of contrast media per Radiology protocol:   Yes    What is the patient's sedation requirement?:   No Sedation    Does the patient have a pacemaker or implanted devices?:   No    Radiology Contrast Protocol - do NOT remove file path:   [email or web address]    Preferred imaging location?:   [facility] (table limit-550lbs)      CHIEF COMPLAINT:  CC: Left breast invasive ductal carcinoma in 2011; prior DCIS of left breast 2004  Current Treatment: Tamoxifen   INTERVAL HISTORY:  Victoria Delacruz is here today for repeat clinical assessment.  She was last seen by me on 06/30/2023.  She had bilateral breast MRI on 10/15/2023.  Overall, results were benign.  She has category B breast density.   She is overdue for unilateral right screening mammogram.  This was ordered as part of today's visit.  A repeat bilateral breast MRI will be ordered for December 2025.  She has noted no changes in either breast.  She denies palpable lumps or masses.  She states she is taking vitamin D  every day.  She denies chest pain, chest pressure, or shortness of breath. She denies headaches or visual disturbances. She denies abdominal pain, nausea, vomiting, or changes in bowel or bladder habits.  She denies fevers or chills. She denies pain. Her appetite is good. Her weight has increased 4 pounds over last year.  I have reviewed the past medical history, past surgical history, social history and family history with the patient and they are unchanged from previous note.  ALLERGIES:  is allergic to citalopram  hydrobromide, duloxetine hcl, fluoxetine , pregabalin , and venlafaxine .  MEDICATIONS:  Current Outpatient Medications  Medication Sig Dispense Refill   amLODipine  (NORVASC ) 5 MG tablet Take 1 tablet (5 mg total) by mouth daily. 90 tablet 3   aspirin -acetaminophen -caffeine (EXCEDRIN MIGRAINE) 250-250-65 MG per tablet Take 1 tablet by mouth every 6 (six) hours as needed for headache.     cholecalciferol  (VITAMIN D ) 1000 units tablet Take 5,000 Units by mouth daily.     cyclobenzaprine  (FLEXERIL ) 10 MG tablet Take 1 tablet (10 mg total) by mouth at bedtime. (Patient taking differently: Take 10 mg by mouth at bedtime as needed.) 20 tablet 0   fluticasone  (FLONASE ) 50 MCG/ACT nasal spray Place 2 sprays into both nostrils daily. 16 g 6   gabapentin  (NEURONTIN ) 100 MG capsule Take 1 capsule (100 mg total) by mouth 3 (three) times daily. 90 capsule 5   levocetirizine (XYZAL  ALLERGY 24HR) 5 MG tablet Take 1 tablet (5 mg total) by mouth every evening. 30 tablet 3   LORazepam  (ATIVAN ) 0.5 MG tablet      naproxen  (NAPROSYN ) 500 MG tablet      penicillin potassium (VEETID) 125 MG/5ML solution Take 125 mg by mouth 4  (four) times daily. DOSE UNKNOWN PUT IN 125mg  AS PLACEHOLDER. Patient taking temporarily after having teeth pulled.     potassium chloride  (KLOR-CON  M) 10 MEQ tablet Take 1 tablet (10 mEq total) by mouth 2 (two) times daily. 30 tablet 1   propranolol  (INDERAL ) 20 MG tablet      SUMAtriptan  (IMITREX ) 100 MG tablet Take 1 tablet at earliest onset of migraine.  May repeat in 2 hours if headache persists or recurs.  Maximum 2 tablets in 24 hours. 10 tablet 5   No current facility-administered medications for this visit.    HISTORY OF PRESENT ILLNESS:   Oncology History  Malignant neoplasm of female breast (HCC)  08/02/2003 Initial Diagnosis   MALIGNANT NEOPLASM OF BREAST UNSPECIFIED SITE. High-grade DCIS of Left breast; Tumor was 4.0 cm. Sentinel lymph nodes were negative.  ER, 24%; PR 2%.  Patient had saline implant.    08/02/2003 Surgery   L mastectomy with reconstruction Maude Salt   05/07/2010 Surgery   R port-a-cath placement   05/11/2010 Relapse/Recurrence   Invasive  ductal carcinoma of L breast; ER: 99%, positive; PR: 53%, positive; Ki 67 (mib-1): 20%; hER@ neu by FISH without ampliication; ratio of her 2 CEP 17 was 1.17.    05/28/2010 Surgery   Local excision of the L invasive ductal carcinoma by Elon Pacini.   06/08/2010 - 08/10/2010 Chemotherapy   Completed 4 cycles of adjuvant chemotherapy with cytoxan and taxotere in combination with neulasta    09/08/2010 - 11/04/2010 Radiation Therapy   XRT was given to the left breast and chall wall consisting of 4780 in 26 fractions with an 1800 cGy boost in 9 fractions Dr Jason.   11/04/2010 - 04/17/2011 Chemotherapy   Tamoxifen  taken sporadically and discontinued due to vaginal bleeding.    08/30/2011 Surgery   Laparoscopic assisted vaginal hysterectomy.   04/10/2012 Imaging   MRI of the brain normal.  No cause of the patient's headache identified.    12/17/2012 Imaging   CT scan of abdomen and pelvis showed no acute findings in the  abdomen or pelvis.  There were felt to be hepatic hemangiomas present, mesuring 2.5 cm in the superior right heaptic lobe at the hepatic dome and a 7 mm lesion located anteriorly.    08/23/2013 Imaging   Digital diagnostic unilateral right mammogram showed no mamographic or sonographic evidence of malignancy, right breast.      09/11/2013 -  Anti-estrogen oral therapy   Tamoxifen  20 mg once daily, planning for 10 years.   11/26/2016 Imaging   MR Cervical Spine W WO Contrast IMPRESSION: Small left sided disc protrusion at C5-6 extending into the foramen. This could be a source of radiculopathy involving the left C6 nerve root. Diffusely abnormal bone marrow likely related to prior chemotherapy or anemia.   03/17/2017 Imaging   US  Abdomen IMPRESSION: No acute or focal abnormality identified.    03/17/2017 Imaging   US  Abdomen Complete  IMPRESSION: No acute or focal abnormality identified.   03/23/2018 Imaging   03/23/2018 Mammogram IMPRESSION: No evidence of malignancy.   09/12/2018 Surgery   She underwent breast reconstruction on 09/12/2018 with Dr Leora   10/23/2022 Imaging    MRI Bilateral Breast IMPRESSION: Evidence of previous left mastectomy with retropectoral implant reconstruction. Otherwise, unremarkable bilateral breast MRI without evidence of malignancy.  RECOMMENDATION: Recommend continued annual bilateral screening 3D mammography as patient's next annual bilateral mammogram is due in April 2024. Consider continued annual high risk screening breast MRI.  The American Cancer Society recommends annual MRI and mammography in patients with an estimated lifetime risk of developing breast cancer greater than 20 - 25%, or who are known or suspected to be positive for the breast cancer gene.  BI-RADS CATEGORY  1: Negative.   02/17/2023 Mammogram   MM 3D mammogram  IMPRESSION: No mammographic evidence of malignancy. A result letter of this screening mammogram will be mailed  directly to the patient.   RECOMMENDATION: Screening mammogram in one year.  (Code:SM-R-39M)  BI-RADS CATEGORY  1: Negative.       REVIEW OF SYSTEMS:   Constitutional: Denies fevers, chills or abnormal weight loss Eyes: Denies blurriness of vision Ears, nose, mouth, throat, and face: Denies mucositis or sore throat Respiratory: Denies cough, dyspnea or wheezes Cardiovascular: Denies palpitation, chest discomfort or lower extremity swelling Gastrointestinal:  Denies nausea, heartburn or change in bowel habits Skin: Denies abnormal skin rashes Lymphatics: Denies new lymphadenopathy or easy bruising Neurological:Denies numbness, tingling or new weaknesses Behavioral/Psych: Mood is stable, no new changes  All other systems were reviewed with the  patient and are negative.   VITALS:   Today's Vitals   06/22/24 1100 06/22/24 1113  BP:  130/76  Pulse:  71  Resp:  17  Temp:  98 F (36.7 C)  TempSrc:  Temporal  SpO2:  97%  Weight:  168 lb 8 oz (76.4 kg)  Height:  5' 3 (1.6 m)  PainSc: 4     Body mass index is 29.85 kg/m.   Wt Readings from Last 3 Encounters:  06/22/24 168 lb 8 oz (76.4 kg)  03/15/24 164 lb (74.4 kg)  06/30/23 164 lb 5 oz (74.5 kg)    Body mass index is 29.85 kg/m.  Performance status (ECOG): 1 - Symptomatic but completely ambulatory  PHYSICAL EXAM:   GENERAL:alert, no distress and comfortable SKIN: skin color, texture, turgor are normal, no rashes or significant lesions EYES: normal, Conjunctiva are pink and non-injected, sclera clear OROPHARYNX:no exudate, no erythema and lips, buccal mucosa, and tongue normal  NECK: supple, thyroid  normal size, non-tender, without nodularity LYMPH:  no palpable lymphadenopathy in the cervical, axillary or inguinal LUNGS: clear to auscultation and percussion with normal breathing effort HEART: regular rate & rhythm and no murmurs and no lower extremity edema ABDOMEN:abdomen soft, non-tender and normal bowel  sounds Musculoskeletal:no cyanosis of digits and no clubbing  NEURO: alert & oriented x 3 with fluent speech, no focal motor/sensory deficits BREAST: There are no palpable masses or lumps in the right breast.  There is no nipple inversion or nipple discharge.  There is no axillary lymphadenopathy on the right.  The left breast is surgically absent.  There are no palpable lumps or masses along the left chest wall.  Nipple is surgically absent.  There is no axillary lymphadenopathy on the left.  LABORATORY DATA:  I have reviewed the data as listed    Component Value Date/Time   NA 139 06/22/2024 1049   NA 140 06/22/2017 0815   K 3.5 06/22/2024 1049   K 3.5 06/22/2017 0815   CL 107 06/22/2024 1049   CL 109 (H) 02/20/2013 1044   CO2 25 06/22/2024 1049   CO2 26 06/22/2017 0815   GLUCOSE 114 (H) 06/22/2024 1049   GLUCOSE 123 06/22/2017 0815   GLUCOSE 101 (H) 02/20/2013 1044   BUN 11 06/22/2024 1049   BUN 9.3 06/22/2017 0815   CREATININE 0.69 06/22/2024 1049   CREATININE 0.9 06/22/2017 0815   CALCIUM 9.2 06/22/2024 1049   CALCIUM 9.3 06/22/2017 0815   PROT 7.6 06/22/2024 1049   PROT 7.6 06/22/2017 0815   ALBUMIN 4.1 06/22/2024 1049   ALBUMIN 3.5 06/22/2017 0815   AST 27 06/22/2024 1049   AST 19 06/22/2017 0815   ALT 21 06/22/2024 1049   ALT 11 06/22/2017 0815   ALKPHOS 114 06/22/2024 1049   ALKPHOS 85 06/22/2017 0815   BILITOT 0.5 06/22/2024 1049   BILITOT 0.75 06/22/2017 0815   GFRNONAA >60 06/22/2024 1049   GFRAA >60 02/08/2020 1031     Lab Results  Component Value Date   WBC 7.0 06/22/2024   NEUTROABS 4.8 06/22/2024   HGB 12.3 06/22/2024   HCT 35.6 (L) 06/22/2024   MCV 86.6 06/22/2024   PLT 247 06/22/2024

## 2024-06-22 ENCOUNTER — Inpatient Hospital Stay (HOSPITAL_BASED_OUTPATIENT_CLINIC_OR_DEPARTMENT_OTHER): Admitting: Nurse Practitioner

## 2024-06-22 ENCOUNTER — Inpatient Hospital Stay: Attending: Nurse Practitioner

## 2024-06-22 VITALS — BP 130/76 | HR 71 | Temp 98.0°F | Resp 17 | Ht 63.0 in | Wt 168.5 lb

## 2024-06-22 DIAGNOSIS — Z9012 Acquired absence of left breast and nipple: Secondary | ICD-10-CM | POA: Insufficient documentation

## 2024-06-22 DIAGNOSIS — Z170 Estrogen receptor positive status [ER+]: Secondary | ICD-10-CM

## 2024-06-22 DIAGNOSIS — Z853 Personal history of malignant neoplasm of breast: Secondary | ICD-10-CM | POA: Diagnosis not present

## 2024-06-22 DIAGNOSIS — E559 Vitamin D deficiency, unspecified: Secondary | ICD-10-CM | POA: Insufficient documentation

## 2024-06-22 DIAGNOSIS — Z9221 Personal history of antineoplastic chemotherapy: Secondary | ICD-10-CM | POA: Diagnosis not present

## 2024-06-22 DIAGNOSIS — Z1231 Encounter for screening mammogram for malignant neoplasm of breast: Secondary | ICD-10-CM | POA: Diagnosis not present

## 2024-06-22 DIAGNOSIS — C50412 Malignant neoplasm of upper-outer quadrant of left female breast: Secondary | ICD-10-CM | POA: Diagnosis not present

## 2024-06-22 DIAGNOSIS — Z923 Personal history of irradiation: Secondary | ICD-10-CM | POA: Diagnosis not present

## 2024-06-22 LAB — CMP (CANCER CENTER ONLY)
ALT: 21 U/L (ref 0–44)
AST: 27 U/L (ref 15–41)
Albumin: 4.1 g/dL (ref 3.5–5.0)
Alkaline Phosphatase: 114 U/L (ref 38–126)
Anion gap: 7 (ref 5–15)
BUN: 11 mg/dL (ref 6–20)
CO2: 25 mmol/L (ref 22–32)
Calcium: 9.2 mg/dL (ref 8.9–10.3)
Chloride: 107 mmol/L (ref 98–111)
Creatinine: 0.69 mg/dL (ref 0.44–1.00)
GFR, Estimated: >60 mL/min (ref >60)
Glucose, Bld: 114 mg/dL — ABNORMAL HIGH (ref 70–99)
Potassium: 3.5 mmol/L (ref 3.5–5.1)
Sodium: 139 mmol/L (ref 135–145)
Total Bilirubin: 0.5 mg/dL (ref 0.0–1.2)
Total Protein: 7.6 g/dL (ref 6.5–8.1)

## 2024-06-22 LAB — CBC WITH DIFFERENTIAL (CANCER CENTER ONLY)
Abs Immature Granulocytes: 0.01 K/uL (ref 0.00–0.07)
Basophils Absolute: 0.1 K/uL (ref 0.0–0.1)
Basophils Relative: 1 %
Eosinophils Absolute: 0.1 K/uL (ref 0.0–0.5)
Eosinophils Relative: 2 %
HCT: 35.6 % — ABNORMAL LOW (ref 36.0–46.0)
Hemoglobin: 12.3 g/dL (ref 12.0–15.0)
Immature Granulocytes: 0 %
Lymphocytes Relative: 24 %
Lymphs Abs: 1.7 K/uL (ref 0.7–4.0)
MCH: 29.9 pg (ref 26.0–34.0)
MCHC: 34.6 g/dL (ref 30.0–36.0)
MCV: 86.6 fL (ref 80.0–100.0)
Monocytes Absolute: 0.4 K/uL (ref 0.1–1.0)
Monocytes Relative: 6 %
Neutro Abs: 4.8 K/uL (ref 1.7–7.7)
Neutrophils Relative %: 67 %
Platelet Count: 247 K/uL (ref 150–400)
RBC: 4.11 MIL/uL (ref 3.87–5.11)
RDW: 13.7 % (ref 11.5–15.5)
WBC Count: 7 K/uL (ref 4.0–10.5)
nRBC: 0 % (ref 0.0–0.2)

## 2024-06-22 LAB — VITAMIN D 25 HYDROXY (VIT D DEFICIENCY, FRACTURES): Vit D, 25-Hydroxy: 56.72 ng/mL (ref 30–100)

## 2024-06-24 ENCOUNTER — Encounter: Payer: Self-pay | Admitting: Nurse Practitioner

## 2024-06-28 ENCOUNTER — Other Ambulatory Visit: Payer: Medicare Other

## 2024-06-28 ENCOUNTER — Encounter: Payer: Medicare Other | Admitting: Nurse Practitioner

## 2024-06-29 ENCOUNTER — Ambulatory Visit

## 2024-06-29 ENCOUNTER — Telehealth: Payer: Self-pay | Admitting: Neurology

## 2024-06-29 DIAGNOSIS — G43709 Chronic migraine without aura, not intractable, without status migrainosus: Secondary | ICD-10-CM

## 2024-06-29 MED ORDER — DIPHENHYDRAMINE HCL 50 MG/ML IJ SOLN
50.0000 mg | Freq: Once | INTRAMUSCULAR | Status: AC
  Administered 2024-06-29: 25 mg via INTRAMUSCULAR

## 2024-06-29 MED ORDER — METOCLOPRAMIDE HCL 5 MG/ML IJ SOLN
10.0000 mg | Freq: Once | INTRAMUSCULAR | Status: AC
  Administered 2024-06-29: 10 mg via INTRAMUSCULAR

## 2024-06-29 MED ORDER — KETOROLAC TROMETHAMINE 60 MG/2ML IM SOLN
60.0000 mg | Freq: Once | INTRAMUSCULAR | Status: AC
  Administered 2024-06-29: 60 mg via INTRAMUSCULAR

## 2024-06-29 NOTE — Telephone Encounter (Signed)
 Patient can be reached on her cell phone at [phone].

## 2024-06-29 NOTE — Telephone Encounter (Signed)
 Per note from last visit 03/15/24 , At this time, she would like to hold off on primary treatment for headaches and address neck pain to see if that will help. She would like referral back to her previous neurosurgeon, Dr. Gillie. In meantime, continue gabapentin  100mg  twice daily.    Patient agrees to have a headache cocktail today. She is aware her driver has to be in the Building with her.    Front desk please schedule patient for 3 pm today.

## 2024-06-29 NOTE — Telephone Encounter (Addendum)
 Patient  seen today for a headache cocktail.  Per patient  these migraines I can't take it there is a dull pain and a pin that shots through my head.  It comes and goes. Every day.  Patient would like to have a MRI Brain.

## 2024-06-29 NOTE — Telephone Encounter (Signed)
 Can offer migraine cocktail if she can come today with a driver, thanks

## 2024-06-29 NOTE — Telephone Encounter (Signed)
 Patient called and stated that she previously had an MRI of her neck and is aware that she has a bulging disc. However, she wanted to know if the MRI covered her neck only or was the head scanned as well. She states she has a migraine and that it is worsening. She described the headache as a dull pain with sharp shooting pain from time to time. She has not been able to get relief from the headache. I informed patient that I would send a message to Dr. Skeet and his medical assistant. I let her know that Dr. Skeet was out of the office and would return on Wednesday. She is aware that if her pain gets worse she can go to Urgent Care or the ER.

## 2024-07-06 NOTE — Addendum Note (Signed)
 Addended by: OZELL JESUSA PARAS on: 07/06/2024 04:10 PM   Modules accepted: Orders

## 2024-07-12 DIAGNOSIS — E559 Vitamin D deficiency, unspecified: Secondary | ICD-10-CM | POA: Diagnosis not present

## 2024-07-12 DIAGNOSIS — R7303 Prediabetes: Secondary | ICD-10-CM | POA: Diagnosis not present

## 2024-07-12 DIAGNOSIS — K769 Liver disease, unspecified: Secondary | ICD-10-CM | POA: Diagnosis not present

## 2024-07-12 DIAGNOSIS — Z23 Encounter for immunization: Secondary | ICD-10-CM | POA: Diagnosis not present

## 2024-07-12 DIAGNOSIS — M5030 Other cervical disc degeneration, unspecified cervical region: Secondary | ICD-10-CM | POA: Diagnosis not present

## 2024-07-12 DIAGNOSIS — I10 Essential (primary) hypertension: Secondary | ICD-10-CM | POA: Diagnosis not present

## 2024-07-12 DIAGNOSIS — M797 Fibromyalgia: Secondary | ICD-10-CM | POA: Diagnosis not present

## 2024-07-12 DIAGNOSIS — Z853 Personal history of malignant neoplasm of breast: Secondary | ICD-10-CM | POA: Diagnosis not present

## 2024-07-12 DIAGNOSIS — Z79899 Other long term (current) drug therapy: Secondary | ICD-10-CM | POA: Diagnosis not present

## 2024-07-12 DIAGNOSIS — Z0000 Encounter for general adult medical examination without abnormal findings: Secondary | ICD-10-CM | POA: Diagnosis not present

## 2024-07-17 ENCOUNTER — Other Ambulatory Visit: Payer: Self-pay | Admitting: Family Medicine

## 2024-07-17 DIAGNOSIS — K769 Liver disease, unspecified: Secondary | ICD-10-CM

## 2024-07-25 ENCOUNTER — Ambulatory Visit
Admission: RE | Admit: 2024-07-25 | Discharge: 2024-07-25 | Disposition: A | Discharge status: Home / Self Care | Source: Ambulatory Visit | Attending: Nurse Practitioner | Admitting: Nurse Practitioner

## 2024-07-25 DIAGNOSIS — Z1231 Encounter for screening mammogram for malignant neoplasm of breast: Secondary | ICD-10-CM

## 2024-07-25 DIAGNOSIS — C50412 Malignant neoplasm of upper-outer quadrant of left female breast: Secondary | ICD-10-CM

## 2024-08-03 ENCOUNTER — Other Ambulatory Visit

## 2024-08-15 ENCOUNTER — Ambulatory Visit
Admission: RE | Admit: 2024-08-15 | Discharge: 2024-08-15 | Disposition: A | Discharge status: Home / Self Care | Source: Ambulatory Visit | Attending: Family Medicine | Admitting: Family Medicine

## 2024-08-15 ENCOUNTER — Ambulatory Visit
Admission: RE | Admit: 2024-08-15 | Discharge: 2024-08-15 | Disposition: A | Source: Ambulatory Visit | Attending: Neurology

## 2024-08-15 DIAGNOSIS — G43709 Chronic migraine without aura, not intractable, without status migrainosus: Secondary | ICD-10-CM

## 2024-08-15 DIAGNOSIS — K769 Liver disease, unspecified: Secondary | ICD-10-CM

## 2024-08-15 MED ORDER — GADOPICLENOL 0.5 MMOL/ML IV SOLN
8.0000 mL | Freq: Once | INTRAVENOUS | Status: AC | PRN
  Administered 2024-08-15: 8 mL via INTRAVENOUS

## 2024-08-17 ENCOUNTER — Ambulatory Visit: Payer: Self-pay | Admitting: Neurology

## 2024-08-17 NOTE — Progress Notes (Signed)
 Patient advised.

## 2024-08-29 ENCOUNTER — Ambulatory Visit (HOSPITAL_BASED_OUTPATIENT_CLINIC_OR_DEPARTMENT_OTHER): Attending: Family Medicine | Admitting: Physical Therapy

## 2024-08-29 DIAGNOSIS — M25512 Pain in left shoulder: Secondary | ICD-10-CM | POA: Diagnosis present

## 2024-08-29 DIAGNOSIS — M542 Cervicalgia: Secondary | ICD-10-CM | POA: Diagnosis present

## 2024-08-29 DIAGNOSIS — R29898 Other symptoms and signs involving the musculoskeletal system: Secondary | ICD-10-CM | POA: Insufficient documentation

## 2024-08-29 DIAGNOSIS — M25511 Pain in right shoulder: Secondary | ICD-10-CM | POA: Insufficient documentation

## 2024-08-29 DIAGNOSIS — M5459 Other low back pain: Secondary | ICD-10-CM | POA: Insufficient documentation

## 2024-08-29 NOTE — Therapy (Signed)
 OUTPATIENT PHYSICAL THERAPY CERVICAL EVALUATION   Patient Name: Victoria Delacruz MRN: [id number] DOB:21-Mar-1971, 53 y.o., female Today's Date: 08/30/2024  END OF SESSION:  PT End of Session - 08/30/24 1706     Visit Number 1    Number of Visits 12    Date for Recertification  10/10/24    Authorization Type [employer] MCR    PT Start Time 1406    PT Stop Time 1459    PT Time Calculation (min) 53 min    Activity Tolerance Patient tolerated treatment well    Behavior During Therapy [facility] for tasks assessed/performed          Past Medical History:  Diagnosis Date   Anemia    Arthritis    Blood transfusion 05/17/11, 05/25/11   anemia   Breast cancer (HCC) 2004 and 2011   Depression    Dizziness    ECTOPIC PREGNANCY 1997 and 2011   x 2   Family history of breast cancer    grandmother   Fibroid    Fibromyalgia    Hemorrhoids 2020   Hypertension    Personal history of radiation therapy 2011   Past Surgical History:  Procedure Laterality Date   ABDOMINAL HYSTERECTOMY     AUGMENTATION MAMMAPLASTY Left    BREAST BIOPSY Left 07/05/2003   malignant   BREAST BIOPSY Left 2011   malignant   EVALUATION UNDER ANESTHESIA WITH HEMORRHOIDECTOMY N/A 05/17/2019   Procedure: HEMORRHOIDECTOMY, HEMORRHOID LIGATION/PEXY, ANORECTAL EXAM UNDER ANESTHESIA., REMOVAL ANAL CRYPT POLYP;  Surgeon: Sheldon Standing, MD;  Location: [facility];  Service: General;  Laterality: N/A;   LAPAROSCOPIC ASSISTED VAGINAL HYSTERECTOMY  08/30/2011   Procedure: LAPAROSCOPIC ASSISTED VAGINAL HYSTERECTOMY;  Surgeon: Gloris DELENA Hugger, MD;  Location: [facility];  Service: Gynecology;  Laterality: N/A;   LAPAROSCOPY FOR ECTOPIC PREGNANCY     LAPAROSCOPY W/ MINI-LAPAROTOMY     MASTECTOMY Left 2001   complete with reconstruction x 3, no b/p punctures to left arm   RECONSTRUCTION BREAST IMMEDIATE / DELAYED W/ TISSUE EXPANDER  2004   REDUCTION MAMMAPLASTY Right    Patient Active Problem List   Diagnosis Date  Noted   Rectal bleeding 04/17/2014   Neck muscle spasm 02/20/2014   Hypokalemia 07/12/2013   Health care maintenance 07/12/2013   IBS (irritable bowel syndrome) 03/06/2013   Depression 01/20/2013   Abdominal pain in female patient 09/21/2012   S/P laparoscopic assisted vaginal hysterectomy (LAVH) 09/30/2011   Fibromyalgia 06/22/2011   History of ectopic pregnancy 05/28/2011   Night sweats 03/25/2011   Fatigue 03/25/2011   Nipple discharge 03/25/2011   Shortness of breath 03/25/2011   Family history of breast cancer 03/25/2011   Essential hypertension 01/28/2011   Hand pain 01/28/2011   Neuropathy 01/28/2011   MIGRAINE HEADACHE 04/14/2010   Malignant neoplasm of female breast (HCC) 03/04/2010   ANEMIA, IRON DEFICIENCY 03/04/2010    PCP: Teresa Channel, MD  REFERRING PROVIDER: Teresa Channel, MD  REFERRING DIAG: M50.30 (ICD-10-CM) - Other cervical disc degeneration, unspecified cervical region M79.7 (ICD-10-CM) - Fibromyalgia  THERAPY DIAG:  Cervicalgia  Other symptoms and signs involving the musculoskeletal system  Bilateral shoulder pain, unspecified chronicity  Other low back pain  Rationale for Evaluation and Treatment: Rehabilitation  ONSET DATE: Chronic pain / PT order 07/12/2024  SUBJECTIVE:  SUBJECTIVE STATEMENT: Pt states she has had neck pain for years.  Pt states Dr. Gillie informed her she had bulging discs in cervical in 2018.  Pt states MD recommended surgery and she declined due to taking care of her husband with health issues.  Pt was also having H/A's.  Pt states she had PT in 2018.  Pt reports PT didn't help.  Pt states she does some exercises at home including cervical rotation.  PT order 07/12/24 indicated water PT.  Pt was having significant H/A's.  She  stopped drinking soda and tea, and is not having the significant H/A's she was having.  Pt does still get migraines. Pt has pain with driving and is unable to drive long distances.  Pt has pain with bending and taking care of flowers/plants.  Pt unable to clean her shower and vacuum.  Pt likes washing dishes because it sooths her hands.  Pt has pain with turning head.  Pt can have difficulty sleeping.     Hand dominance: Left  PERTINENT HISTORY:  HTN, migraines/H/A's, anxiety and depression Breast CA 2004, 2011 with surgery, radiation, and chemo liver lesion dating back 10 years which is not cancerous  PAIN:  NPRS:  6/10 current, 12/10 worst, 4/10 best   Location:  Pt reports having cervical, bilat shoulder, bilat UE, and lumbar pain.  Pt states her hands hurt all the time.  Pt denies any shooting pain down Ue's and denies any N/T.   PRECAUTIONS: None     WEIGHT BEARING RESTRICTIONS: No  FALLS:  Has patient fallen in last 6 months? No  LIVING ENVIRONMENT: Lives with: lives with their spouse Lives in: ranch style home with a bonus room upstairs Stairs: yes    OCCUPATION:  Pt is on disability.   PLOF: Independent  PATIENT GOALS: to reduce pain, to learn exercises to improve pain   OBJECTIVE:  Note: Objective measures were completed at Evaluation unless otherwise noted.  DIAGNOSTIC FINDINGS:  MRI in 12/24: FINDINGS: Alignment: Straightening of the normal cervical lordosis. No listhesis.   Vertebrae: Vertebral body height maintained without acute or chronic fracture. Bone marrow signal intensity overall within normal limits. No worrisome osseous lesions or abnormal marrow edema.   Cord: Normal signal and morphology.   Posterior Fossa, vertebral arteries, paraspinal tissues: Unremarkable.   Disc levels:   C2-C3: Unremarkable.   C3-C4: Tiny central disc protrusion mildly indents the ventral thecal sac. Mild right-sided uncovertebral spurring. No spinal stenosis.  Foramina remain patent.   C4-C5: Small central disc protrusion indents the ventral thecal sac (series 10, image 18). No significant spinal stenosis. Foramina remain patent.   C5-C6: Left eccentric disc osteophyte complex. Posterior component flattens and partially effaces the ventral thecal sac without significant spinal stenosis. Moderate left C6 foraminal narrowing. Right neural foramina remains patent.   C6-C7: Mild disc bulge with uncovertebral spurring. No spinal stenosis. Foramina remain adequately patent.   C7-T1: Normal interspace. Mild left-sided facet hypertrophy. No canal or foraminal stenosis.   IMPRESSION: 1. Normal MRI appearance of the cervical spinal cord. 2. Left eccentric disc osteophyte complex at C5-6 with resultant moderate left C6 foraminal stenosis. 3. Additional mild noncompressive disc bulging at C3-4 through C6-7 without significant stenosis or neural impingement.  PATIENT SURVEYS:  NDI:  31  COGNITION: Overall cognitive status: Within functional limits for tasks assessed    PALPATION: TTP:  L > R UT and cervical paraspinals.  Cervical SP's  Pt has soft tissue tightness in cervical paraspinals and UT.  CERVICAL ROM:   Active ROM A/PROM (deg) eval  Flexion WFL with pain on L sided cervical  Extension 40 deg  Right lateral flexion 36 with pain  Left lateral flexion 24 with pain  Right rotation WFL  Left rotation 80% with pain   (Blank rows = not tested)  UPPER EXTREMITY ROM:  Active ROM Right eval Left eval  Shoulder flexion 164 with pain 142 with pain  Shoulder extension    Shoulder abduction 113 with pain 87 with pain  Shoulder adduction    Shoulder extension    Shoulder internal rotation    Shoulder external rotation    Elbow flexion    Elbow extension    Wrist flexion    Wrist extension    Wrist ulnar deviation    Wrist radial deviation    Wrist pronation    Wrist supination     (Blank rows = not tested)  UPPER  EXTREMITY MMT:  MMT Right eval Left eval  Shoulder flexion 5/5 5/5  Shoulder extension    Shoulder scaption 5/5 weak  Shoulder adduction    Shoulder extension    Shoulder internal rotation    Shoulder external rotation    Middle trapezius    Lower trapezius    Elbow flexion 5/5 5/5  Elbow extension 5/5 seated 5/5 seated  Wrist flexion    Wrist extension    Wrist ulnar deviation    Wrist radial deviation    Wrist pronation    Wrist supination    Grip strength     (Blank rows = not tested)    TREATMENT:                                                                                                                                Pt performed cervical retractions 2x10, shoulder rolls x 10 reps, and scap retractions.  Pt received a HEP handout and was instructed in correct form and appropriate frequency.   PATIENT EDUCATION:  Education details: dx, objective findings, HEP, relevant anatomy, POC, rationale of interventions, prognosis, and what to expect next treatment.   Person educated: Patient Education method: Explanation, Demonstration, Tactile cues, Verbal cues, and Handouts Education comprehension: verbalized understanding, returned demonstration, verbal cues required, tactile cues required, and needs further education  HOME EXERCISE PROGRAM: Access Code: [email or web address] URL: [email or web address] Date: 08/29/2024 Prepared by: Mose Minerva  Exercises - Seated Cervical Retraction  - 2 x daily - 7 x weekly - 2 sets - 10 reps - Standing Scapular Retraction  - 2 x daily - 7 x weekly - 2 sets - 10 reps - Standing Backward Shoulder Rolls  - 2 x daily - 7 x weekly - 2 sets - 10 reps  ASSESSMENT:  CLINICAL IMPRESSION: Patient is a 53 y.o. female with dx's of cervical DDD and fibromyalgia.  Pt c/o's of pain in cervical, bilat shoulders and Ue's, hands, and lumbar.  Pt has chronic neck pain bulging  discs.  Pt had PT in 2018 and reports PT didn't help.  Pt has pain  with driving and is unable to drive long distances.  She reports pain with turning her head.  Pt can have difficulty sleeping.  She has pain with bending and taking care of flowers/plants and is unable to perform select household cleaning.  Pt has limited cervical AROM and soft tissue tightness in cervical paraspinals and UT.  Pt should benefit from skilled PT to address impairments and improve overall function.  OBJECTIVE IMPAIRMENTS: decreased activity tolerance, decreased ROM, decreased strength, increased fascial restrictions, impaired UE functional use, and pain.   ACTIVITY LIMITATIONS: bending and sleeping  PARTICIPATION LIMITATIONS: cleaning, driving, community activity, and yard work  PERSONAL FACTORS: Time since onset of injury/illness/exacerbation and 1-2 comorbidities: Anxiety and depression and Migraines/H/A's are also affecting patient's functional outcome.   REHAB POTENTIAL: Good  CLINICAL DECISION MAKING: Evolving/moderate complexity  EVALUATION COMPLEXITY: Moderate   GOALS:   SHORT TERM GOALS: Target date:  09/19/24  Pt will tolerate aquatic therapy without adverse effects for improved tolerance to activity, pain, and function.  Baseline:  Goal status: INITIAL  2.  Pt will report at least a 25% improvement in pain and sx's overall.  Baseline:  Goal status: INITIAL  3.  Pt will be independent and compliant with HEP for improved ROM, postural strength and stability, pain, and function.  Baseline:  Goal status: INITIAL  4.  Pt's worst pain will be no > 8/10. Baseline:  Goal status: INITIAL Target date:  09/26/24  5.  Pt will demo improved cervical AROM with L rotation to be [facility] and L Sb'ing to be at least 35 deg for improved stiffness and mobility.  Baseline:  Goal status: INITIAL    LONG TERM GOALS: Target date: 10/10/2024  Pt will be able to turn her head while driving and drive her local distances without increased pain.  Baseline:  Goal status:  INITIAL  2.  Pt will be able to take care of her flowers and plants without significant pain.  Baseline:  Goal status: INITIAL  3.  Pt will be independent with aquatic program in order to have a consistent routine to improve functional tolerance, strength, and functional mobility.  Baseline:  Goal status: INITIAL  4.  Pt will report at least a 70% improvement in pain and sx's overall and a 50-60% improvement in household cleaning.  Baseline:  Goal status: INITIAL     PLAN:  PT FREQUENCY: 2x/week  PT DURATION: 6 weeks  PLANNED INTERVENTIONS: 97164- PT Re-evaluation, 97750- Physical Performance Testing, 97110-Therapeutic exercises, 97530- Therapeutic activity, [id number]- Neuromuscular re-education, 97535- Self Care, [id number]- Manual therapy, [id number]- Gait training, [phone]- Aquatic Therapy, [phone]- Electrical stimulation (unattended), [phone]- Electrical stimulation (manual), [id number]- Ultrasound, 79439 (1-2 muscles), 20561 (3+ muscles)- Dry Needling, Patient/Family education, Taping, Joint mobilization, Spinal mobilization, Cryotherapy, and Moist heat  PLAN FOR NEXT SESSION:  Pt to begin with aquatic therapy and then transition to alternating land based and aquatic therapy.  Postural stability and strengthening. STM to cervical paraspinals and UT.  Date of referral: 07/12/2024 Referring provider: Teresa Channel, MD Referring diagnosis? M50.30 (ICD-10-CM) - Other cervical disc degeneration, unspecified cervical region M79.7 (ICD-10-CM) - Fibromyalgia Treatment diagnosis? (if different than referring diagnosis)  Cervicalgia  Other symptoms and signs involving the musculoskeletal system  Bilateral shoulder pain, unspecified chronicity  Other low back pain  What was this (referring dx) caused by? Ongoing Issue  Lysle of Condition: Chronic (continuous duration > 3 months)  Laterality: Both  Current Functional Measure Score: Neck Index 19  Objective measurements identify impairments when they  are compared to normal values, the uninvolved extremity, and prior level of function.  [x]  Yes  []  No  Objective assessment of functional ability: Moderate functional limitations   Briefly describe symptoms:  Pt has pain in cervical, bilat shoulders and Ue's, and lumbar.  pain with driving and is unable to drive long distances.  Pt has pain with bending and taking care of flowers/plants.  Pt unable to clean her shower and vacuum.  Pt has pain with turning head.  Pt can have difficulty sleeping.   How did symptoms start: chronic pain  Average pain intensity:  6/10 current, 12/10 worst, 4/10 best  How often does the pt experience symptoms? Constantly  How much have the symptoms interfered with usual daily activities? Moderately  How has condition changed since care began at this facility? NA - initial visit  In general, how is the patients overall health? Good   BACK PAIN (STarT Back Screening Tool) Pt has back pain and MD order is for cervical DDD and fibromyalgia.   Leigh Minerva III PT, DPT 08/31/24 10:27 AM

## 2024-08-30 ENCOUNTER — Encounter (HOSPITAL_BASED_OUTPATIENT_CLINIC_OR_DEPARTMENT_OTHER): Payer: Self-pay | Admitting: Physical Therapy

## 2024-08-30 ENCOUNTER — Other Ambulatory Visit: Payer: Self-pay

## 2024-09-25 ENCOUNTER — Ambulatory Visit (HOSPITAL_BASED_OUTPATIENT_CLINIC_OR_DEPARTMENT_OTHER): Admitting: Physical Therapy

## 2024-09-30 NOTE — Therapy (Incomplete)
 OUTPATIENT PHYSICAL THERAPY CERVICAL EVALUATION   Patient Name: Victoria Delacruz MRN: [id number] DOB:December 31, 1970, 53 y.o., female Today's Date: 09/30/2024  END OF SESSION:    Past Medical History:  Diagnosis Date   Anemia    Arthritis    Blood transfusion 05/17/11, 05/25/11   anemia   Breast cancer (HCC) 2004 and 2011   Depression    Dizziness    ECTOPIC PREGNANCY 1997 and 2011   x 2   Family history of breast cancer    grandmother   Fibroid    Fibromyalgia    Hemorrhoids 2020   Hypertension    Personal history of radiation therapy 2011   Past Surgical History:  Procedure Laterality Date   ABDOMINAL HYSTERECTOMY     AUGMENTATION MAMMAPLASTY Left    BREAST BIOPSY Left 07/05/2003   malignant   BREAST BIOPSY Left 2011   malignant   EVALUATION UNDER ANESTHESIA WITH HEMORRHOIDECTOMY N/A 05/17/2019   Procedure: HEMORRHOIDECTOMY, HEMORRHOID LIGATION/PEXY, ANORECTAL EXAM UNDER ANESTHESIA., REMOVAL ANAL CRYPT POLYP;  Surgeon: Sheldon Standing, MD;  Location: [facility];  Service: General;  Laterality: N/A;   LAPAROSCOPIC ASSISTED VAGINAL HYSTERECTOMY  08/30/2011   Procedure: LAPAROSCOPIC ASSISTED VAGINAL HYSTERECTOMY;  Surgeon: Gloris DELENA Hugger, MD;  Location: [facility];  Service: Gynecology;  Laterality: N/A;   LAPAROSCOPY FOR ECTOPIC PREGNANCY     LAPAROSCOPY W/ MINI-LAPAROTOMY     MASTECTOMY Left 2001   complete with reconstruction x 3, no b/p punctures to left arm   RECONSTRUCTION BREAST IMMEDIATE / DELAYED W/ TISSUE EXPANDER  2004   REDUCTION MAMMAPLASTY Right    Patient Active Problem List   Diagnosis Date Noted   Rectal bleeding 04/17/2014   Neck muscle spasm 02/20/2014   Hypokalemia 07/12/2013   Health care maintenance 07/12/2013   IBS (irritable bowel syndrome) 03/06/2013   Depression 01/20/2013   Abdominal pain in female patient 09/21/2012   S/P laparoscopic assisted vaginal hysterectomy (LAVH) 09/30/2011   Fibromyalgia 06/22/2011   History of  ectopic pregnancy 05/28/2011   Night sweats 03/25/2011   Fatigue 03/25/2011   Nipple discharge 03/25/2011   Shortness of breath 03/25/2011   Family history of breast cancer 03/25/2011   Essential hypertension 01/28/2011   Hand pain 01/28/2011   Neuropathy 01/28/2011   MIGRAINE HEADACHE 04/14/2010   Malignant neoplasm of female breast (HCC) 03/04/2010   ANEMIA, IRON DEFICIENCY 03/04/2010    PCP: Teresa Channel, MD  REFERRING PROVIDER: Teresa Channel, MD  REFERRING DIAG: M50.30 (ICD-10-CM) - Other cervical disc degeneration, unspecified cervical region M79.7 (ICD-10-CM) - Fibromyalgia  THERAPY DIAG:  No diagnosis found.  Rationale for Evaluation and Treatment: Rehabilitation  ONSET DATE: Chronic pain / PT order 07/12/2024  SUBJECTIVE:  SUBJECTIVE STATEMENT:    Initial Subjective Pt states she has had neck pain for years.  Pt states Dr. Gillie informed her she had bulging discs in cervical in 2018.  Pt states MD recommended surgery and she declined due to taking care of her husband with health issues.  Pt was also having H/A's.  Pt states she had PT in 2018.  Pt reports PT didn't help.  Pt states she does some exercises at home including cervical rotation.  PT order 07/12/24 indicated water PT.  Pt was having significant H/A's.  She stopped drinking soda and tea, and is not having the significant H/A's she was having.  Pt does still get migraines. Pt has pain with driving and is unable to drive long distances.  Pt has pain with bending and taking care of flowers/plants.  Pt unable to clean her shower and vacuum.  Pt likes washing dishes because it sooths her hands.  Pt has pain with turning head.  Pt can have difficulty sleeping.     Hand dominance: Left  PERTINENT HISTORY:  HTN,  migraines/H/A's, anxiety and depression Breast CA 2004, 2011 with surgery, radiation, and chemo liver lesion dating back 10 years which is not cancerous  PAIN:  NPRS:  6/10 current, 12/10 worst, 4/10 best   Location:  Pt reports having cervical, bilat shoulder, bilat UE, and lumbar pain.  Pt states her hands hurt all the time.  Pt denies any shooting pain down Ue's and denies any N/T.   PRECAUTIONS: None     WEIGHT BEARING RESTRICTIONS: No  FALLS:  Has patient fallen in last 6 months? No  LIVING ENVIRONMENT: Lives with: lives with their spouse Lives in: ranch style home with a bonus room upstairs Stairs: yes    OCCUPATION:  Pt is on disability.   PLOF: Independent  PATIENT GOALS: to reduce pain, to learn exercises to improve pain   OBJECTIVE:  Note: Objective measures were completed at Evaluation unless otherwise noted.  DIAGNOSTIC FINDINGS:  MRI in 12/24: FINDINGS: Alignment: Straightening of the normal cervical lordosis. No listhesis.   Vertebrae: Vertebral body height maintained without acute or chronic fracture. Bone marrow signal intensity overall within normal limits. No worrisome osseous lesions or abnormal marrow edema.   Cord: Normal signal and morphology.   Posterior Fossa, vertebral arteries, paraspinal tissues: Unremarkable.   Disc levels:   C2-C3: Unremarkable.   C3-C4: Tiny central disc protrusion mildly indents the ventral thecal sac. Mild right-sided uncovertebral spurring. No spinal stenosis. Foramina remain patent.   C4-C5: Small central disc protrusion indents the ventral thecal sac (series 10, image 18). No significant spinal stenosis. Foramina remain patent.   C5-C6: Left eccentric disc osteophyte complex. Posterior component flattens and partially effaces the ventral thecal sac without significant spinal stenosis. Moderate left C6 foraminal narrowing. Right neural foramina remains patent.   C6-C7: Mild disc bulge with  uncovertebral spurring. No spinal stenosis. Foramina remain adequately patent.   C7-T1: Normal interspace. Mild left-sided facet hypertrophy. No canal or foraminal stenosis.   IMPRESSION: 1. Normal MRI appearance of the cervical spinal cord. 2. Left eccentric disc osteophyte complex at C5-6 with resultant moderate left C6 foraminal stenosis. 3. Additional mild noncompressive disc bulging at C3-4 through C6-7 without significant stenosis or neural impingement.  PATIENT SURVEYS:  NDI:  70  COGNITION: Overall cognitive status: Within functional limits for tasks assessed    PALPATION: TTP:  L > R UT and cervical paraspinals.  Cervical SP's  Pt has soft tissue tightness  in cervical paraspinals and UT.  CERVICAL ROM:   Active ROM A/PROM (deg) eval  Flexion WFL with pain on L sided cervical  Extension 40 deg  Right lateral flexion 36 with pain  Left lateral flexion 24 with pain  Right rotation WFL  Left rotation 80% with pain   (Blank rows = not tested)  UPPER EXTREMITY ROM:  Active ROM Right eval Left eval  Shoulder flexion 164 with pain 142 with pain  Shoulder extension    Shoulder abduction 113 with pain 87 with pain  Shoulder adduction    Shoulder extension    Shoulder internal rotation    Shoulder external rotation    Elbow flexion    Elbow extension    Wrist flexion    Wrist extension    Wrist ulnar deviation    Wrist radial deviation    Wrist pronation    Wrist supination     (Blank rows = not tested)  UPPER EXTREMITY MMT:  MMT Right eval Left eval  Shoulder flexion 5/5 5/5  Shoulder extension    Shoulder scaption 5/5 weak  Shoulder adduction    Shoulder extension    Shoulder internal rotation    Shoulder external rotation    Middle trapezius    Lower trapezius    Elbow flexion 5/5 5/5  Elbow extension 5/5 seated 5/5 seated  Wrist flexion    Wrist extension    Wrist ulnar deviation    Wrist radial deviation    Wrist pronation    Wrist  supination    Grip strength     (Blank rows = not tested)    TREATMENT:                                                                                                                               [facility] Adult PT Treatment:                                                DATE: 10/01/24 Pt seen for aquatic therapy today.  Treatment took place in water 3.5-4.75 ft in depth at the [facility] pool. Temp of water was 91.  Pt entered/exited the pool via *** with *** rail.  *Intro to setting *walking forward, back and side stepping in 3.6 ft with ue support of *L stretch *3 way hamstring stretch *Ue support on wall: toe raises; heel raises; hip add/abd; hip extension; relaxed squats *seated on lift: cycling; hip add/abd; LAQ *decompression position with noodle wrapped     across chest  Pt requires the buoyancy and hydrostatic pressure of water for support, and to offload joints by unweighting joint load by at least 50 % in navel deep water and by at least 75-80% in chest to neck deep water.  Viscosity of the water is needed for  resistance of strengthening. Water current perturbations provides challenge to standing balance requiring increased core activation.      Pt performed cervical retractions 2x10, shoulder rolls x 10 reps, and scap retractions.  Pt received a HEP handout and was instructed in correct form and appropriate frequency.   PATIENT EDUCATION:  Education details: dx, objective findings, HEP, relevant anatomy, POC, rationale of interventions, prognosis, and what to expect next treatment.   Person educated: Patient Education method: Explanation, Demonstration, Tactile cues, Verbal cues, and Handouts Education comprehension: verbalized understanding, returned demonstration, verbal cues required, tactile cues required, and needs further education  HOME EXERCISE PROGRAM: Access Code: [email or web address] URL: [email or web address] Date: 08/29/2024 Prepared by: Mose Minerva  Exercises - Seated Cervical Retraction  - 2 x daily - 7 x weekly - 2 sets - 10 reps - Standing Scapular Retraction  - 2 x daily - 7 x weekly - 2 sets - 10 reps - Standing Backward Shoulder Rolls  - 2 x daily - 7 x weekly - 2 sets - 10 reps  ASSESSMENT:  CLINICAL IMPRESSION:    Pt demonstrates safety and independence in aquatic setting with therapist instructing from deck. Pt is confident in setting, moving throughout all depths easily.  Pt is directed through various movement patterns and trials in both sitting and standing positions.   Pt is provided VC and demonstration throughout session for execution of exercises and while monitoring toleration.***  Goals are ongoing.    Initial Impression Patient is a 53 y.o. female with dx's of cervical DDD and fibromyalgia.  Pt c/o's of pain in cervical, bilat shoulders and Ue's, hands, and lumbar.  Pt has chronic neck pain bulging discs.  Pt had PT in 2018 and reports PT didn't help.  Pt has pain with driving and is unable to drive long distances.  She reports pain with turning her head.  Pt can have difficulty sleeping.  She has pain with bending and taking care of flowers/plants and is unable to perform select household cleaning.  Pt has limited cervical AROM and soft tissue tightness in cervical paraspinals and UT.  Pt should benefit from skilled PT to address impairments and improve overall function.  OBJECTIVE IMPAIRMENTS: decreased activity tolerance, decreased ROM, decreased strength, increased fascial restrictions, impaired UE functional use, and pain.   ACTIVITY LIMITATIONS: bending and sleeping  PARTICIPATION LIMITATIONS: cleaning, driving, community activity, and yard work  PERSONAL FACTORS: Time since onset of injury/illness/exacerbation and 1-2 comorbidities: Anxiety and depression and Migraines/H/A's are also affecting patient's functional outcome.   REHAB POTENTIAL: Good  CLINICAL DECISION MAKING: Evolving/moderate  complexity  EVALUATION COMPLEXITY: Moderate   GOALS:   SHORT TERM GOALS: Target date:  09/19/24  Pt will tolerate aquatic therapy without adverse effects for improved tolerance to activity, pain, and function.  Baseline:  Goal status: INITIAL  2.  Pt will report at least a 25% improvement in pain and sx's overall.  Baseline:  Goal status: INITIAL  3.  Pt will be independent and compliant with HEP for improved ROM, postural strength and stability, pain, and function.  Baseline:  Goal status: INITIAL  4.  Pt's worst pain will be no > 8/10. Baseline:  Goal status: INITIAL Target date:  09/26/24  5.  Pt will demo improved cervical AROM with L rotation to be [facility] and L Sb'ing to be at least 35 deg for improved stiffness and mobility.  Baseline:  Goal status: INITIAL    LONG TERM GOALS: Target date: 10/10/2024  Pt will be  able to turn her head while driving and drive her local distances without increased pain.  Baseline:  Goal status: INITIAL  2.  Pt will be able to take care of her flowers and plants without significant pain.  Baseline:  Goal status: INITIAL  3.  Pt will be independent with aquatic program in order to have a consistent routine to improve functional tolerance, strength, and functional mobility.  Baseline:  Goal status: INITIAL  4.  Pt will report at least a 70% improvement in pain and sx's overall and a 50-60% improvement in household cleaning.  Baseline:  Goal status: INITIAL     PLAN:  PT FREQUENCY: 2x/week  PT DURATION: 6 weeks  PLANNED INTERVENTIONS: 97164- PT Re-evaluation, 97750- Physical Performance Testing, 97110-Therapeutic exercises, 97530- Therapeutic activity, [id number]- Neuromuscular re-education, 97535- Self Care, [id number]- Manual therapy, [id number]- Gait training, [phone]- Aquatic Therapy, [phone]- Electrical stimulation (unattended), [phone]- Electrical stimulation (manual), [id number]- Ultrasound, 79439 (1-2 muscles), 20561 (3+ muscles)- Dry Needling,  Patient/Family education, Taping, Joint mobilization, Spinal mobilization, Cryotherapy, and Moist heat  PLAN FOR NEXT SESSION:  Pt to begin with aquatic therapy and then transition to alternating land based and aquatic therapy.  Postural stability and strengthening. STM to cervical paraspinals and UT.  Date of referral: 07/12/2024 Referring provider: Teresa Channel, MD Referring diagnosis? M50.30 (ICD-10-CM) - Other cervical disc degeneration, unspecified cervical region M79.7 (ICD-10-CM) - Fibromyalgia Treatment diagnosis? (if different than referring diagnosis)  Cervicalgia  Other symptoms and signs involving the musculoskeletal system  Bilateral shoulder pain, unspecified chronicity  Other low back pain  What was this (referring dx) caused by? Ongoing Issue  Lysle of Condition: Chronic (continuous duration > 3 months)   Laterality: Both  Current Functional Measure Score: Neck Index 19  Objective measurements identify impairments when they are compared to normal values, the uninvolved extremity, and prior level of function.  [x]  Yes  []  No  Objective assessment of functional ability: Moderate functional limitations   Briefly describe symptoms:  Pt has pain in cervical, bilat shoulders and Ue's, and lumbar.  pain with driving and is unable to drive long distances.  Pt has pain with bending and taking care of flowers/plants.  Pt unable to clean her shower and vacuum.  Pt has pain with turning head.  Pt can have difficulty sleeping.   How did symptoms start: chronic pain  Average pain intensity:  6/10 current, 12/10 worst, 4/10 best  How often does the pt experience symptoms? Constantly  How much have the symptoms interfered with usual daily activities? Moderately  How has condition changed since care began at this facility? NA - initial visit  In general, how is the patients overall health? Good   BACK PAIN (STarT Back Screening Tool) Pt has back pain and MD order is  for cervical DDD and fibromyalgia.   [address] [place]) Loyda Costin MPT 09/30/24 5:29 PM [facility] [address] [place], [place], [address] Phone: [phone]   Fax:  [phone]

## 2024-10-01 ENCOUNTER — Ambulatory Visit (HOSPITAL_BASED_OUTPATIENT_CLINIC_OR_DEPARTMENT_OTHER): Admitting: Physical Therapy

## 2024-10-01 NOTE — Progress Notes (Unsigned)
 NEUROLOGY FOLLOW UP OFFICE NOTE  Victoria Delacruz [id number]  Assessment/Plan:   Chronic migraine without aura, without status migrainosus, not intractable - over 15 headache days a month for over 3 months, failed topiramate , venlafaxine , propranolol , duloxetine Bilateral occipital neuralgia Cervicalgia with left sided radiculopathy   Continue physical therapy for neck to treat left sided radiculopathy and occipital neuralgia.  If no improvement in 6 weeks, we can refer for either/both left C6 epidural injection and occipital nerve blocks. She takes Flexeril  at bedtime as it causes drowsiness.  In the morning, she will try methocarbamol 500mg  for neck stiffness as it may cause less sedation. She will try wearing a left wrist splint to see if symptoms in left hand may also be due to carpal tunnel syndrome as well Sumatriptan  as needed for migraine. Follow up 6 months.     Subjective:  Victoria Delacruz is a 53 year old left-handed woman with hypertension, fibromyalgia, and history of left breast invasive duct carcinoma on Tamoxifen , who follows up for headaches.    UPDATE: Last visit, she deferred starting another preventative medication and requested referral back to neurosurgery for further evaluation of her cervical spine.  She never heard back from them.    Migraine: Controlled.    Occipital neuralgia/Neck pain: By August, she reported worsening migraines.  But in addition to the pressure, she also endorsed worsening of occipital neuralgia, a paroxysmal sharp pain.    MRI of brain with and without contrast on 08/15/2024 revealed mild nonspecific cerebral white matter hyperintensities but no acute or concerning intracranial abnormality.  She just started physical therapy for her neck pain.    Current medications: Current NSAIDS:  naproxen  Current analgesics:  Excedrin, Extra-strength Tylenol  Current triptans:  sumatriptan  100mg  Current anti-emetic:  Zofran  4mg  Current  muscle relaxants:  cyclobenzaprine  10mg  at bedtime PRN Current anti-anxiolytic:  no Current sleep aide:  no Current Antihypertensive medications:  propranolol  20mg  twice daily, amlodipine  Current Antidepressant medications:  none Current Anticonvulsant medications:  none Current CGRP inhibitor:  none Current Vitamins/Herbal/Supplements:  D3 Current Antihistamines/Decongestants:  Xyzal ,  Other therapy:  no Other medication:  Tamoxifen , ASA   Caffeine:  tea Alcohol:  no Smoker:  no Diet:  Increasing water Exercise:  no Depression/anxiety:  Increased: her husband has had two brain surgeries to remove tumors; relationship with her foster daughter Sleep hygiene:  poor  HISTORY:  Migraine Without Aura:   Onset:  childhood Location:  Band-like, bilateral posterior, bilateral temporal Quality:  pressure Intensity:  11/10 Aura:  no Prodrome:  no Postdrome:  no Associated symptoms:  Photophobia, phonophobia, neck pain, sometimes nausea and vomiting.  She has not had any new worse headache of her life, waking up from sleep Duration:  several hours (sleeps it off) Frequency:  10-12 days a month Triggers/exacerbating factors:  stress Relieving factors:  Ice pack, heating pad Activity:  Aggravates.  Cannot function 10 days out of the month  Bilateral Occipital Neuralgia/cervicalgia: Onset:  2023 Location:  usually left occipital, sometimes right temporal/occipital Quality:  Shooting Duration:  6 seconds Frequency:  several times a week  Fairly persistent neck pain.  Left sided.  Has radicular pain down the left arm.    Imaging: 10/31/2019 MRI BRAIN W WO:  Normal MRI of the brain  10/15/2023 MRI C-SPINE WO:  1. Normal MRI appearance of the cervical spinal cord. 2. Left eccentric disc osteophyte complex at C5-6 with resultant moderate left C6 foraminal stenosis. 3. Additional mild noncompressive disc bulging at C3-4 through  C6-7 without significant stenosis or neural  impingement. 11/26/2016 MRI C-SPINE WO:  Small left sided disc protrusion at C5-6 extending into the foramen.  This could be a source of radiculopathy involving the left C6 nerve root.  Small central disc protrusion at C4-5 to left of midline without cord deformity or stenosis.  C2-3 and C3-4 negative. 03/09/2017 CT MYELOGRAM C-SPINE:  Small disc protrusions from C4-C5 to C6-C7. Those at C4-C5 and C5-C6 are eccentric to the left, and a small C5-C6 struck back could the C6 neural foramen as demonstrated on the January MRI. Query left C6 radiculitis. Effaced ventral CSF space at these levels with nospinal stenosis.   Past NSAIDS:  Ibuprofen , meloxicam, ASA Past analgesics:  Tylenol  Past abortive triptans:  sumatriptan  Past muscle relaxants:  no Past anti-emetic:  no Past antihypertensive medications:  metoprolol Past antidepressant medications:  venlafaxine  XR (caused weight loss), amitriptyline , duloxetine, citalopram , fluoxetine  Past anticonvulsant medications:  topiramate , gabapentin , pregablin Past CGRP inhibitor (attempted to start Qulipta  in 2025 but could not afford it). Past vitamins/Herbal/Supplements:  no Other past therapies:  no    Family history of headache:  No   MRI of cervical spine on 11/26/16 personally reviewed and showed small left-sided disc protrusion at C5-6. CT head from 01/11/17 was personally reviewed and was normal.  PAST MEDICAL HISTORY: Past Medical History:  Diagnosis Date   Anemia    Arthritis    Blood transfusion 05/17/11, 05/25/11   anemia   Breast cancer (HCC) 2004 and 2011   Depression    Dizziness    ECTOPIC PREGNANCY 1997 and 2011   x 2   Family history of breast cancer    grandmother   Fibroid    Fibromyalgia    Hemorrhoids 2020   Hypertension    Personal history of radiation therapy 2011    MEDICATIONS: Current Outpatient Medications on File Prior to Visit  Medication Sig Dispense Refill   amLODipine  (NORVASC ) 5 MG tablet Take 1 tablet (5  mg total) by mouth daily. 90 tablet 3   aspirin -acetaminophen -caffeine (EXCEDRIN MIGRAINE) 250-250-65 MG per tablet Take 1 tablet by mouth every 6 (six) hours as needed for headache.     cholecalciferol  (VITAMIN D ) 1000 units tablet Take 5,000 Units by mouth daily.     cyclobenzaprine  (FLEXERIL ) 10 MG tablet Take 1 tablet (10 mg total) by mouth at bedtime. (Patient taking differently: Take 10 mg by mouth at bedtime as needed.) 20 tablet 0   fluticasone  (FLONASE ) 50 MCG/ACT nasal spray Place 2 sprays into both nostrils daily. 16 g 6   gabapentin  (NEURONTIN ) 100 MG capsule Take 1 capsule (100 mg total) by mouth 3 (three) times daily. 90 capsule 5   levocetirizine (XYZAL  ALLERGY 24HR) 5 MG tablet Take 1 tablet (5 mg total) by mouth every evening. 30 tablet 3   LORazepam  (ATIVAN ) 0.5 MG tablet      naproxen  (NAPROSYN ) 500 MG tablet      penicillin potassium (VEETID) 125 MG/5ML solution Take 125 mg by mouth 4 (four) times daily. DOSE UNKNOWN PUT IN 125mg  AS PLACEHOLDER. Patient taking temporarily after having teeth pulled.     potassium chloride  (KLOR-CON  M) 10 MEQ tablet Take 1 tablet (10 mEq total) by mouth 2 (two) times daily. 30 tablet 1   propranolol  (INDERAL ) 20 MG tablet      SUMAtriptan  (IMITREX ) 100 MG tablet Take 1 tablet at earliest onset of migraine.  May repeat in 2 hours if headache persists or recurs.  Maximum 2 tablets  in 24 hours. 10 tablet 5   No current facility-administered medications on file prior to visit.    ALLERGIES: Allergies  Allergen Reactions   Citalopram  Hydrobromide     Other reaction(s): not effective   Duloxetine Hcl Other (See Comments)   Fluoxetine      Other reaction(s): unable due to interaction for tamoxifen    Pregabalin      Other reaction(s): thoughts of death   Venlafaxine      Other reaction(s): headaches, weight loss    FAMILY HISTORY: Family History  Problem Relation Age of Onset   Hypertension Mother    Chronic bronchitis Mother     Hypertension Father        History of blood clots in lungs   COPD Father    Lymphoma Sister        Hodgkins   Cancer Sister        lung   Cancer Maternal Aunt        breast    Pulmonary fibrosis Maternal Aunt    Multiple sclerosis Maternal Uncle    Cancer Maternal Uncle        prostate/ bone    Stroke Maternal Uncle    Multiple sclerosis Maternal Uncle    Cancer Maternal Grandmother        breast   Breast cancer Maternal Grandmother    Cancer Maternal Grandfather        lung   Multiple sclerosis Cousin       Objective:  Blood pressure 125/76, pulse 64, height 5' 3 (1.6 m), weight 169 lb (76.7 kg), last menstrual period 04/23/2011, SpO2 97%. General: No acute distress.  Patient appears well-groomed.      Juliene Dunnings, DO  CC: Montie Pizza, MD

## 2024-10-02 ENCOUNTER — Ambulatory Visit: Admitting: Neurology

## 2024-10-02 ENCOUNTER — Encounter: Payer: Self-pay | Admitting: Neurology

## 2024-10-02 VITALS — BP 125/76 | HR 64 | Ht 63.0 in | Wt 169.0 lb

## 2024-10-02 DIAGNOSIS — M5412 Radiculopathy, cervical region: Secondary | ICD-10-CM | POA: Diagnosis not present

## 2024-10-02 DIAGNOSIS — M5481 Occipital neuralgia: Secondary | ICD-10-CM

## 2024-10-02 MED ORDER — METHOCARBAMOL 500 MG PO TABS: 500.0000 mg | ORAL_TABLET | Freq: Every morning | ORAL | 5 refills | Status: AC

## 2024-10-02 NOTE — Patient Instructions (Addendum)
 Continue physical therapy for neck to treat left arm pain and shooting pain from back of head.  Give me update in 6 weeks.  If no improvement, will refer to pain management for injections Take methocarbamol 500mg  in the morning to help with neck stiffness. Hopefully it will not be sedating.  Still take Flexeril  at bedtime.  If ineffective or have side effects, let me know Wear a wrist splint at night when you go to bed on the left wrist to see if discomfort in left hand improves. Cock up wrist splint for carpal tunnel symptoms can be bought in local drug stores or online. This should especially be worn at night while sleeping. [employer] is cheaper    Sumatriptan  as needed

## 2024-10-04 ENCOUNTER — Encounter (HOSPITAL_BASED_OUTPATIENT_CLINIC_OR_DEPARTMENT_OTHER): Payer: Self-pay

## 2024-10-04 ENCOUNTER — Ambulatory Visit (HOSPITAL_BASED_OUTPATIENT_CLINIC_OR_DEPARTMENT_OTHER): Attending: Family Medicine

## 2024-10-04 DIAGNOSIS — M25512 Pain in left shoulder: Secondary | ICD-10-CM | POA: Diagnosis present

## 2024-10-04 DIAGNOSIS — R29898 Other symptoms and signs involving the musculoskeletal system: Secondary | ICD-10-CM | POA: Diagnosis present

## 2024-10-04 DIAGNOSIS — M5459 Other low back pain: Secondary | ICD-10-CM | POA: Diagnosis present

## 2024-10-04 DIAGNOSIS — M542 Cervicalgia: Secondary | ICD-10-CM | POA: Diagnosis present

## 2024-10-04 DIAGNOSIS — M25511 Pain in right shoulder: Secondary | ICD-10-CM | POA: Diagnosis present

## 2024-10-04 NOTE — Therapy (Signed)
 OUTPATIENT PHYSICAL THERAPY CERVICAL TREATMENT   Patient Name: Victoria Delacruz MRN: [id number] DOB:1971/02/28, 53 y.o., female Today's Date: 10/04/2024  END OF SESSION:  PT End of Session - 10/04/24 1107     Visit Number 2    Number of Visits 12    Date for Recertification  10/10/24    Authorization Type [employer] MCR    PT Start Time 1104    PT Stop Time 1145    PT Time Calculation (min) 41 min    Activity Tolerance Patient tolerated treatment well    Behavior During Therapy [facility] for tasks assessed/performed           Past Medical History:  Diagnosis Date   Anemia    Arthritis    Blood transfusion 05/17/11, 05/25/11   anemia   Breast cancer (HCC) 2004 and 2011   Depression    Dizziness    ECTOPIC PREGNANCY 1997 and 2011   x 2   Family history of breast cancer    grandmother   Fibroid    Fibromyalgia    Hemorrhoids 2020   Hypertension    Personal history of radiation therapy 2011   Past Surgical History:  Procedure Laterality Date   ABDOMINAL HYSTERECTOMY     AUGMENTATION MAMMAPLASTY Left    BREAST BIOPSY Left 07/05/2003   malignant   BREAST BIOPSY Left 2011   malignant   EVALUATION UNDER ANESTHESIA WITH HEMORRHOIDECTOMY N/A 05/17/2019   Procedure: HEMORRHOIDECTOMY, HEMORRHOID LIGATION/PEXY, ANORECTAL EXAM UNDER ANESTHESIA., REMOVAL ANAL CRYPT POLYP;  Surgeon: Sheldon Standing, MD;  Location: [facility];  Service: General;  Laterality: N/A;   LAPAROSCOPIC ASSISTED VAGINAL HYSTERECTOMY  08/30/2011   Procedure: LAPAROSCOPIC ASSISTED VAGINAL HYSTERECTOMY;  Surgeon: Gloris DELENA Hugger, MD;  Location: [facility];  Service: Gynecology;  Laterality: N/A;   LAPAROSCOPY FOR ECTOPIC PREGNANCY     LAPAROSCOPY W/ MINI-LAPAROTOMY     MASTECTOMY Left 2001   complete with reconstruction x 3, no b/p punctures to left arm   RECONSTRUCTION BREAST IMMEDIATE / DELAYED W/ TISSUE EXPANDER  2004   REDUCTION MAMMAPLASTY Right    Patient Active Problem List   Diagnosis Date  Noted   Rectal bleeding 04/17/2014   Neck muscle spasm 02/20/2014   Hypokalemia 07/12/2013   Health care maintenance 07/12/2013   IBS (irritable bowel syndrome) 03/06/2013   Depression 01/20/2013   Abdominal pain in female patient 09/21/2012   S/P laparoscopic assisted vaginal hysterectomy (LAVH) 09/30/2011   Fibromyalgia 06/22/2011   History of ectopic pregnancy 05/28/2011   Night sweats 03/25/2011   Fatigue 03/25/2011   Nipple discharge 03/25/2011   Shortness of breath 03/25/2011   Family history of breast cancer 03/25/2011   Essential hypertension 01/28/2011   Hand pain 01/28/2011   Neuropathy 01/28/2011   MIGRAINE HEADACHE 04/14/2010   Malignant neoplasm of female breast (HCC) 03/04/2010   ANEMIA, IRON DEFICIENCY 03/04/2010    PCP: Teresa Channel, MD  REFERRING PROVIDER: Teresa Channel, MD  REFERRING DIAG: M50.30 (ICD-10-CM) - Other cervical disc degeneration, unspecified cervical region M79.7 (ICD-10-CM) - Fibromyalgia  THERAPY DIAG:  Cervicalgia  Other symptoms and signs involving the musculoskeletal system  Bilateral shoulder pain, unspecified chronicity  Other low back pain  Rationale for Evaluation and Treatment: Rehabilitation  ONSET DATE: Chronic pain / PT order 07/12/2024  SUBJECTIVE:  SUBJECTIVE STATEMENT:  Pt reports she met with neurologist who wants her to continue with PT and then maybe try injections into neck mm to help with pain. Ongoing c/o bil UT and neck tightness/pain.   Eval: Pt states she has had neck pain for years.  Pt states Dr. Gillie informed her she had bulging discs in cervical in 2018.  Pt states MD recommended surgery and she declined due to taking care of her husband with health issues.  Pt was also having H/A's.  Pt states she had PT  in 2018.  Pt reports PT didn't help.  Pt states she does some exercises at home including cervical rotation.  PT order 07/12/24 indicated water PT.  Pt was having significant H/A's.  She stopped drinking soda and tea, and is not having the significant H/A's she was having.  Pt does still get migraines. Pt has pain with driving and is unable to drive long distances.  Pt has pain with bending and taking care of flowers/plants.  Pt unable to clean her shower and vacuum.  Pt likes washing dishes because it sooths her hands.  Pt has pain with turning head.  Pt can have difficulty sleeping.     Hand dominance: Left  PERTINENT HISTORY:  HTN, migraines/H/A's, anxiety and depression Breast CA 2004, 2011 with surgery, radiation, and chemo liver lesion dating back 10 years which is not cancerous  PAIN:  NPRS:  6/10 current, 12/10 worst, 4/10 best   Location:  Pt reports having cervical, bilat shoulder, bilat UE, and lumbar pain.  Pt states her hands hurt all the time.  Pt denies any shooting pain down Ue's and denies any N/T.   PRECAUTIONS: None     WEIGHT BEARING RESTRICTIONS: No  FALLS:  Has patient fallen in last 6 months? No  LIVING ENVIRONMENT: Lives with: lives with their spouse Lives in: ranch style home with a bonus room upstairs Stairs: yes    OCCUPATION:  Pt is on disability.   PLOF: Independent  PATIENT GOALS: to reduce pain, to learn exercises to improve pain   OBJECTIVE:  Note: Objective measures were completed at Evaluation unless otherwise noted.  DIAGNOSTIC FINDINGS:  MRI in 12/24: FINDINGS: Alignment: Straightening of the normal cervical lordosis. No listhesis.   Vertebrae: Vertebral body height maintained without acute or chronic fracture. Bone marrow signal intensity overall within normal limits. No worrisome osseous lesions or abnormal marrow edema.   Cord: Normal signal and morphology.   Posterior Fossa, vertebral arteries, paraspinal  tissues: Unremarkable.   Disc levels:   C2-C3: Unremarkable.   C3-C4: Tiny central disc protrusion mildly indents the ventral thecal sac. Mild right-sided uncovertebral spurring. No spinal stenosis. Foramina remain patent.   C4-C5: Small central disc protrusion indents the ventral thecal sac (series 10, image 18). No significant spinal stenosis. Foramina remain patent.   C5-C6: Left eccentric disc osteophyte complex. Posterior component flattens and partially effaces the ventral thecal sac without significant spinal stenosis. Moderate left C6 foraminal narrowing. Right neural foramina remains patent.   C6-C7: Mild disc bulge with uncovertebral spurring. No spinal stenosis. Foramina remain adequately patent.   C7-T1: Normal interspace. Mild left-sided facet hypertrophy. No canal or foraminal stenosis.   IMPRESSION: 1. Normal MRI appearance of the cervical spinal cord. 2. Left eccentric disc osteophyte complex at C5-6 with resultant moderate left C6 foraminal stenosis. 3. Additional mild noncompressive disc bulging at C3-4 through C6-7 without significant stenosis or neural impingement.  PATIENT SURVEYS:  NDI:  68  COGNITION:  Overall cognitive status: Within functional limits for tasks assessed    PALPATION: TTP:  L > R UT and cervical paraspinals.  Cervical SP's  Pt has soft tissue tightness in cervical paraspinals and UT.  CERVICAL ROM:   Active ROM A/PROM (deg) eval  Flexion WFL with pain on L sided cervical  Extension 40 deg  Right lateral flexion 36 with pain  Left lateral flexion 24 with pain  Right rotation WFL  Left rotation 80% with pain   (Blank rows = not tested)  UPPER EXTREMITY ROM:  Active ROM Right eval Left eval  Shoulder flexion 164 with pain 142 with pain  Shoulder extension    Shoulder abduction 113 with pain 87 with pain  Shoulder adduction    Shoulder extension    Shoulder internal rotation    Shoulder external rotation    Elbow  flexion    Elbow extension    Wrist flexion    Wrist extension    Wrist ulnar deviation    Wrist radial deviation    Wrist pronation    Wrist supination     (Blank rows = not tested)  UPPER EXTREMITY MMT:  MMT Right eval Left eval  Shoulder flexion 5/5 5/5  Shoulder extension    Shoulder scaption 5/5 weak  Shoulder adduction    Shoulder extension    Shoulder internal rotation    Shoulder external rotation    Middle trapezius    Lower trapezius    Elbow flexion 5/5 5/5  Elbow extension 5/5 seated 5/5 seated  Wrist flexion    Wrist extension    Wrist ulnar deviation    Wrist radial deviation    Wrist pronation    Wrist supination    Grip strength     (Blank rows = not tested)    TREATMENT:                                                                                                                                12/4: Cupping to bil UT and cervical ps STM to bil UT UT stretching 30sec ea LS stretching 30sec ea Chin tucks 5 x10 seated Cervical rotation with nodding x10ea Sub occipital release in supine Instruction in theracane use  Eval: Pt performed cervical retractions 2x10, shoulder rolls x 10 reps, and scap retractions.  Pt received a HEP handout and was instructed in correct form and appropriate frequency.   PATIENT EDUCATION:  Education details: dx, objective findings, HEP, relevant anatomy, POC, rationale of interventions, prognosis, and what to expect next treatment.   Person educated: Patient Education method: Explanation, Demonstration, Tactile cues, Verbal cues, and Handouts Education comprehension: verbalized understanding, returned demonstration, verbal cues required, tactile cues required, and needs further education  HOME EXERCISE PROGRAM: Access Code: [email or web address] URL: [email or web address] Date: 08/29/2024 Prepared by: Mose Minerva  Exercises - Seated Cervical Retraction  - 2 x daily - 7 x weekly - 2 sets - 10 reps -  Standing Scapular Retraction  - 2 x daily - 7 x weekly - 2 sets - 10 reps - Standing Backward Shoulder Rolls  - 2 x daily - 7 x weekly - 2 sets - 10 reps  ASSESSMENT:  CLINICAL IMPRESSION:  Cupping trialed today to decrease tightness present bilaterally in UT and cervical ps. Good tolerance for this as well as STM and sub occipital release. Pt reported she would like to avoid future injections if possible. Reviewed chin tuck performance as pt performing incorrectly. Discussed use of theracane as a means of self symptom management as well as use of heating pad. Pt felt strong stretching with UT and LS stretches. Reviewed postural awareness strategies and corrections. Will monitor pain level and progress as tolerated.   Eval: Patient is a 53 y.o. female with dx's of cervical DDD and fibromyalgia.  Pt c/o's of pain in cervical, bilat shoulders and Ue's, hands, and lumbar.  Pt has chronic neck pain bulging discs.  Pt had PT in 2018 and reports PT didn't help.  Pt has pain with driving and is unable to drive long distances.  She reports pain with turning her head.  Pt can have difficulty sleeping.  She has pain with bending and taking care of flowers/plants and is unable to perform select household cleaning.  Pt has limited cervical AROM and soft tissue tightness in cervical paraspinals and UT.  Pt should benefit from skilled PT to address impairments and improve overall function.  OBJECTIVE IMPAIRMENTS: decreased activity tolerance, decreased ROM, decreased strength, increased fascial restrictions, impaired UE functional use, and pain.   ACTIVITY LIMITATIONS: bending and sleeping  PARTICIPATION LIMITATIONS: cleaning, driving, community activity, and yard work  PERSONAL FACTORS: Time since onset of injury/illness/exacerbation and 1-2 comorbidities: Anxiety and depression and Migraines/H/A's are also affecting patient's functional outcome.   REHAB POTENTIAL: Good  CLINICAL DECISION MAKING:  Evolving/moderate complexity  EVALUATION COMPLEXITY: Moderate   GOALS:   SHORT TERM GOALS: Target date:  09/19/24  Pt will tolerate aquatic therapy without adverse effects for improved tolerance to activity, pain, and function.  Baseline:  Goal status: INITIAL  2.  Pt will report at least a 25% improvement in pain and sx's overall.  Baseline:  Goal status: INITIAL  3.  Pt will be independent and compliant with HEP for improved ROM, postural strength and stability, pain, and function.  Baseline:  Goal status: INITIAL  4.  Pt's worst pain will be no > 8/10. Baseline:  Goal status: INITIAL Target date:  09/26/24  5.  Pt will demo improved cervical AROM with L rotation to be [facility] and L Sb'ing to be at least 35 deg for improved stiffness and mobility.  Baseline:  Goal status: INITIAL    LONG TERM GOALS: Target date: 10/10/2024  Pt will be able to turn her head while driving and drive her local distances without increased pain.  Baseline:  Goal status: INITIAL  2.  Pt will be able to take care of her flowers and plants without significant pain.  Baseline:  Goal status: INITIAL  3.  Pt will be independent with aquatic program in order to have a consistent routine to improve functional tolerance, strength, and functional mobility.  Baseline:  Goal status: INITIAL  4.  Pt will report at least a 70% improvement in pain and sx's overall and a 50-60% improvement in household cleaning.  Baseline:  Goal status: INITIAL     PLAN:  PT FREQUENCY: 2x/week  PT DURATION: 6 weeks  PLANNED  INTERVENTIONS: 97164- PT Re-evaluation, 97750- Physical Performance Testing, 97110-Therapeutic exercises, 97530- Therapeutic activity, [id number]- Neuromuscular re-education, 97535- Self Care, [id number]- Manual therapy, [phone]- Gait training, [phone]- Aquatic Therapy, [phone]- Electrical stimulation (unattended), [phone]- Electrical stimulation (manual), [id number]- Ultrasound, [id number] (1-2 muscles), [phone] (3+  muscles)- Dry Needling, Patient/Family education, Taping, Joint mobilization, Spinal mobilization, Cryotherapy, and Moist heat  PLAN FOR NEXT SESSION:  Pt to begin with aquatic therapy and then transition to alternating land based and aquatic therapy.  Postural stability and strengthening. STM to cervical paraspinals and UT.  Date of referral: 07/12/2024 Referring provider: Teresa Channel, MD Referring diagnosis? M50.30 (ICD-10-CM) - Other cervical disc degeneration, unspecified cervical region M79.7 (ICD-10-CM) - Fibromyalgia Treatment diagnosis? (if different than referring diagnosis)  Cervicalgia  Other symptoms and signs involving the musculoskeletal system  Bilateral shoulder pain, unspecified chronicity  Other low back pain  What was this (referring dx) caused by? Ongoing Issue  Lysle of Condition: Chronic (continuous duration > 3 months)   Laterality: Both  Current Functional Measure Score: Neck Index 19  Objective measurements identify impairments when they are compared to normal values, the uninvolved extremity, and prior level of function.  [x]  Yes  []  No  Objective assessment of functional ability: Moderate functional limitations   Briefly describe symptoms:  Pt has pain in cervical, bilat shoulders and Ue's, and lumbar.  pain with driving and is unable to drive long distances.  Pt has pain with bending and taking care of flowers/plants.  Pt unable to clean her shower and vacuum.  Pt has pain with turning head.  Pt can have difficulty sleeping.   How did symptoms start: chronic pain  Average pain intensity:  6/10 current, 12/10 worst, 4/10 best  How often does the pt experience symptoms? Constantly  How much have the symptoms interfered with usual daily activities? Moderately  How has condition changed since care began at this facility? NA - initial visit  In general, how is the patients overall health? Good   BACK PAIN (STarT Back Screening Tool) Pt has back  pain and MD order is for cervical DDD and fibromyalgia.   Asberry Rodes, PTA  10/04/24 1:56 PM

## 2024-10-08 ENCOUNTER — Ambulatory Visit (HOSPITAL_BASED_OUTPATIENT_CLINIC_OR_DEPARTMENT_OTHER): Admitting: Physical Therapy

## 2024-10-11 ENCOUNTER — Encounter (HOSPITAL_BASED_OUTPATIENT_CLINIC_OR_DEPARTMENT_OTHER): Payer: Self-pay | Admitting: Physical Therapy

## 2024-10-11 ENCOUNTER — Ambulatory Visit (HOSPITAL_BASED_OUTPATIENT_CLINIC_OR_DEPARTMENT_OTHER): Admitting: Physical Therapy

## 2024-10-11 DIAGNOSIS — M542 Cervicalgia: Secondary | ICD-10-CM | POA: Diagnosis not present

## 2024-10-11 DIAGNOSIS — M25511 Pain in right shoulder: Secondary | ICD-10-CM

## 2024-10-11 DIAGNOSIS — R29898 Other symptoms and signs involving the musculoskeletal system: Secondary | ICD-10-CM

## 2024-10-11 DIAGNOSIS — M5459 Other low back pain: Secondary | ICD-10-CM

## 2024-10-11 NOTE — Therapy (Signed)
 OUTPATIENT PHYSICAL THERAPY CERVICAL TREATMENT   Patient Name: Victoria Delacruz MRN: [id number] DOB:1971-01-26, 53 y.o., female Today's Date: 10/12/2024  END OF SESSION:  PT End of Session - 10/11/24 0945     Visit Number 3    Number of Visits 12    Date for Recertification  10/10/24    Authorization Type [employer] MCR    PT Start Time 9057    PT Stop Time 1027    PT Time Calculation (min) 45 min    Activity Tolerance Patient tolerated treatment well    Behavior During Therapy [facility] for tasks assessed/performed            Past Medical History:  Diagnosis Date   Anemia    Arthritis    Blood transfusion 05/17/11, 05/25/11   anemia   Breast cancer (HCC) 2004 and 2011   Depression    Dizziness    ECTOPIC PREGNANCY 1997 and 2011   x 2   Family history of breast cancer    grandmother   Fibroid    Fibromyalgia    Hemorrhoids 2020   Hypertension    Personal history of radiation therapy 2011   Past Surgical History:  Procedure Laterality Date   ABDOMINAL HYSTERECTOMY     AUGMENTATION MAMMAPLASTY Left    BREAST BIOPSY Left 07/05/2003   malignant   BREAST BIOPSY Left 2011   malignant   EVALUATION UNDER ANESTHESIA WITH HEMORRHOIDECTOMY N/A 05/17/2019   Procedure: HEMORRHOIDECTOMY, HEMORRHOID LIGATION/PEXY, ANORECTAL EXAM UNDER ANESTHESIA., REMOVAL ANAL CRYPT POLYP;  Surgeon: Sheldon Standing, MD;  Location: [facility];  Service: General;  Laterality: N/A;   LAPAROSCOPIC ASSISTED VAGINAL HYSTERECTOMY  08/30/2011   Procedure: LAPAROSCOPIC ASSISTED VAGINAL HYSTERECTOMY;  Surgeon: Gloris DELENA Hugger, MD;  Location: [facility];  Service: Gynecology;  Laterality: N/A;   LAPAROSCOPY FOR ECTOPIC PREGNANCY     LAPAROSCOPY W/ MINI-LAPAROTOMY     MASTECTOMY Left 2001   complete with reconstruction x 3, no b/p punctures to left arm   RECONSTRUCTION BREAST IMMEDIATE / DELAYED W/ TISSUE EXPANDER  2004   REDUCTION MAMMAPLASTY Right    Patient Active Problem List   Diagnosis Date  Noted   Rectal bleeding 04/17/2014   Neck muscle spasm 02/20/2014   Hypokalemia 07/12/2013   Health care maintenance 07/12/2013   IBS (irritable bowel syndrome) 03/06/2013   Depression 01/20/2013   Abdominal pain in female patient 09/21/2012   S/P laparoscopic assisted vaginal hysterectomy (LAVH) 09/30/2011   Fibromyalgia 06/22/2011   History of ectopic pregnancy 05/28/2011   Night sweats 03/25/2011   Fatigue 03/25/2011   Nipple discharge 03/25/2011   Shortness of breath 03/25/2011   Family history of breast cancer 03/25/2011   Essential hypertension 01/28/2011   Hand pain 01/28/2011   Neuropathy 01/28/2011   MIGRAINE HEADACHE 04/14/2010   Malignant neoplasm of female breast (HCC) 03/04/2010   ANEMIA, IRON DEFICIENCY 03/04/2010    PCP: Teresa Channel, MD  REFERRING PROVIDER: Teresa Channel, MD  REFERRING DIAG: M50.30 (ICD-10-CM) - Other cervical disc degeneration, unspecified cervical region M79.7 (ICD-10-CM) - Fibromyalgia  THERAPY DIAG:  Cervicalgia  Other symptoms and signs involving the musculoskeletal system  Bilateral shoulder pain, unspecified chronicity  Other low back pain  Rationale for Evaluation and Treatment: Rehabilitation  ONSET DATE: Chronic pain / PT order 07/12/2024  SUBJECTIVE:  SUBJECTIVE STATEMENT:  Pt reports she met with neurologist who wants her to continue with PT and then maybe try injections into neck mm to help with pain.  Pt doesn't have any injections scheduled and wants to try PT first.  She felt good after prior treatment.  Pt felt relief after the manual treatments.  Pt is taking care of her niece who has special needs.  She has increased back pain when taking care of niece.  Pt reports compliance with HEP.    Hand dominance:  Left  PERTINENT HISTORY:  HTN, migraines/H/A's, anxiety and depression Breast CA 2004, 2011 with surgery, radiation, and chemo liver lesion dating back 10 years which is not cancerous  PAIN:  NPRS:  6-7/10 current / 8/10 ; 12/10 worst, 4/10 best   Location:  R sided lumbar / cervical, bilat shoulder  Pt states her hands hurt all the time.  Pt denies any shooting pain down Ue's and denies any N/T.   PRECAUTIONS: None     WEIGHT BEARING RESTRICTIONS: No  FALLS:  Has patient fallen in last 6 months? No  LIVING ENVIRONMENT: Lives with: lives with their spouse Lives in: ranch style home with a bonus room upstairs Stairs: yes    OCCUPATION:  Pt is on disability.   PLOF: Independent  PATIENT GOALS: to reduce pain, to learn exercises to improve pain   OBJECTIVE:  Note: Objective measures were completed at Evaluation unless otherwise noted.  DIAGNOSTIC FINDINGS:  MRI in 12/24: FINDINGS: Alignment: Straightening of the normal cervical lordosis. No listhesis.   Vertebrae: Vertebral body height maintained without acute or chronic fracture. Bone marrow signal intensity overall within normal limits. No worrisome osseous lesions or abnormal marrow edema.   Cord: Normal signal and morphology.   Posterior Fossa, vertebral arteries, paraspinal tissues: Unremarkable.   Disc levels:   C2-C3: Unremarkable.   C3-C4: Tiny central disc protrusion mildly indents the ventral thecal sac. Mild right-sided uncovertebral spurring. No spinal stenosis. Foramina remain patent.   C4-C5: Small central disc protrusion indents the ventral thecal sac (series 10, image 18). No significant spinal stenosis. Foramina remain patent.   C5-C6: Left eccentric disc osteophyte complex. Posterior component flattens and partially effaces the ventral thecal sac without significant spinal stenosis. Moderate left C6 foraminal narrowing. Right neural foramina remains patent.   C6-C7: Mild disc bulge  with uncovertebral spurring. No spinal stenosis. Foramina remain adequately patent.   C7-T1: Normal interspace. Mild left-sided facet hypertrophy. No canal or foraminal stenosis.   IMPRESSION: 1. Normal MRI appearance of the cervical spinal cord. 2. Left eccentric disc osteophyte complex at C5-6 with resultant moderate left C6 foraminal stenosis. 3. Additional mild noncompressive disc bulging at C3-4 through C6-7 without significant stenosis or neural impingement.  PATIENT SURVEYS:  NDI:  75  COGNITION: Overall cognitive status: Within functional limits for tasks assessed    PALPATION: TTP:  L > R UT and cervical paraspinals.  Cervical SP's  Pt has soft tissue tightness in cervical paraspinals and UT.  CERVICAL ROM:   Active ROM A/PROM (deg) eval  Flexion WFL with pain on L sided cervical  Extension 40 deg  Right lateral flexion 36 with pain  Left lateral flexion 24 with pain  Right rotation WFL  Left rotation 80% with pain   (Blank rows = not tested)  UPPER EXTREMITY ROM:  Active ROM Right eval Left eval  Shoulder flexion 164 with pain 142 with pain  Shoulder extension    Shoulder abduction 113 with pain  87 with pain  Shoulder adduction    Shoulder extension    Shoulder internal rotation    Shoulder external rotation    Elbow flexion    Elbow extension    Wrist flexion    Wrist extension    Wrist ulnar deviation    Wrist radial deviation    Wrist pronation    Wrist supination     (Blank rows = not tested)  UPPER EXTREMITY MMT:  MMT Right eval Left eval  Shoulder flexion 5/5 5/5  Shoulder extension    Shoulder scaption 5/5 weak  Shoulder adduction    Shoulder extension    Shoulder internal rotation    Shoulder external rotation    Middle trapezius    Lower trapezius    Elbow flexion 5/5 5/5  Elbow extension 5/5 seated 5/5 seated  Wrist flexion    Wrist extension    Wrist ulnar deviation    Wrist radial deviation    Wrist pronation     Wrist supination    Grip strength     (Blank rows = not tested)    TREATMENT:                                                                                                                                12/11: Cervical retraction x 10 Cervical rotation x10 each Seated UT stretch 2x20 sec bilat Seated LS stretch 2x20 sec bilat Supine lumbar rotation  Supine piriformis stretch 3x20 sec bilat  STM to bilat cervical paraspinals in supine Suboccipital release in supine Static cupping to bilat UT in sitting   12/4: Cupping to bil UT and cervical ps STM to bil UT UT stretching 30sec ea LS stretching 30sec ea Chin tucks 5 x10 seated Cervical rotation with nodding x10ea Sub occipital release in supine Instruction in theracane use  Eval: Pt performed cervical retractions 2x10, shoulder rolls x 10 reps, and scap retractions.  Pt received a HEP handout and was instructed in correct form and appropriate frequency.   PATIENT EDUCATION:  Education details: dx, objective findings, HEP, relevant anatomy, POC, rationale of interventions, prognosis, and what to expect next treatment.   Person educated: Patient Education method: Explanation, Demonstration, Tactile cues, Verbal cues, and Handouts Education comprehension: verbalized understanding, returned demonstration, verbal cues required, tactile cues required, and needs further education  HOME EXERCISE PROGRAM: Access Code: [email or web address] URL: [email or web address] Date: 08/29/2024 Prepared by: Mose Minerva  Exercises - Seated Cervical Retraction  - 2 x daily - 7 x weekly - 2 sets - 10 reps - Standing Scapular Retraction  - 2 x daily - 7 x weekly - 2 sets - 10 reps - Standing Backward Shoulder Rolls  - 2 x daily - 7 x weekly - 2 sets - 10 reps  ASSESSMENT:  CLINICAL IMPRESSION:  Pt reports having increased back pain due to taking care of her niece who has special needs.  Pt performed exercises focused on posture,  flexibility, and mobility.  PT provided instruction and cuing for correct form and she performed exercises well with instruction and cuing.  PT performed STM and cupping to improve soft tissue tightness and mobility and to reduce pain and myofascial restrictions and adhesions.  Pt had an appropriate response to cupping.  PT instructed pt to drink plenty of water.  Pt tolerated treatment well.  Pt had no change in cervical pain though reported improved lumbar pain to 4-5/10 after treatment.  Pt should benefit from continued skilled PT to address impairments and goals and improve overall function.  OBJECTIVE IMPAIRMENTS: decreased activity tolerance, decreased ROM, decreased strength, increased fascial restrictions, impaired UE functional use, and pain.   ACTIVITY LIMITATIONS: bending and sleeping  PARTICIPATION LIMITATIONS: cleaning, driving, community activity, and yard work  PERSONAL FACTORS: Time since onset of injury/illness/exacerbation and 1-2 comorbidities: Anxiety and depression and Migraines/H/A's are also affecting patient's functional outcome.   REHAB POTENTIAL: Good  CLINICAL DECISION MAKING: Evolving/moderate complexity  EVALUATION COMPLEXITY: Moderate   GOALS:   SHORT TERM GOALS: Target date:  09/19/24  Pt will tolerate aquatic therapy without adverse effects for improved tolerance to activity, pain, and function.  Baseline:  Goal status: INITIAL  2.  Pt will report at least a 25% improvement in pain and sx's overall.  Baseline:  Goal status: INITIAL  3.  Pt will be independent and compliant with HEP for improved ROM, postural strength and stability, pain, and function.  Baseline:  Goal status: INITIAL  4.  Pt's worst pain will be no > 8/10. Baseline:  Goal status: INITIAL Target date:  09/26/24  5.  Pt will demo improved cervical AROM with L rotation to be [facility] and L Sb'ing to be at least 35 deg for improved stiffness and mobility.  Baseline:  Goal status:  INITIAL    LONG TERM GOALS: Target date: 10/10/2024  Pt will be able to turn her head while driving and drive her local distances without increased pain.  Baseline:  Goal status: INITIAL  2.  Pt will be able to take care of her flowers and plants without significant pain.  Baseline:  Goal status: INITIAL  3.  Pt will be independent with aquatic program in order to have a consistent routine to improve functional tolerance, strength, and functional mobility.  Baseline:  Goal status: INITIAL  4.  Pt will report at least a 70% improvement in pain and sx's overall and a 50-60% improvement in household cleaning.  Baseline:  Goal status: INITIAL     PLAN:  PT FREQUENCY: 2x/week  PT DURATION: 6 weeks  PLANNED INTERVENTIONS: 97164- PT Re-evaluation, 97750- Physical Performance Testing, 97110-Therapeutic exercises, 97530- Therapeutic activity, [id number]- Neuromuscular re-education, 97535- Self Care, [id number]- Manual therapy, [id number]- Gait training, [phone]- Aquatic Therapy, [phone]- Electrical stimulation (unattended), [phone]- Electrical stimulation (manual), [id number]- Ultrasound, 79439 (1-2 muscles), 20561 (3+ muscles)- Dry Needling, Patient/Family education, Taping, Joint mobilization, Spinal mobilization, Cryotherapy, and Moist heat  PLAN FOR NEXT SESSION:  Pt to begin with aquatic therapy and then transition to alternating land based and aquatic therapy.  Postural stability and strengthening. STM and cupping to cervical paraspinals and UT.  Leigh Minerva III PT, DPT 10/12/2024 1:54 PM

## 2024-10-15 ENCOUNTER — Ambulatory Visit (HOSPITAL_BASED_OUTPATIENT_CLINIC_OR_DEPARTMENT_OTHER): Admitting: Physical Therapy

## 2024-10-15 NOTE — Addendum Note (Signed)
 Addended by: MARGRETTE LEIGH RAMAN on: 10/15/2024 09:54 AM   Modules accepted: Orders

## 2024-10-16 ENCOUNTER — Inpatient Hospital Stay: Admission: RE | Admit: 2024-10-16 | Discharge: 2024-10-16 | Attending: Nurse Practitioner

## 2024-10-16 DIAGNOSIS — C50412 Malignant neoplasm of upper-outer quadrant of left female breast: Secondary | ICD-10-CM

## 2024-10-16 MED ORDER — GADOPICLENOL 0.5 MMOL/ML IV SOLN
7.0000 mL | Freq: Once | INTRAVENOUS | Status: AC | PRN
  Administered 2024-10-16: 12:00:00 7 mL via INTRAVENOUS

## 2024-10-18 ENCOUNTER — Encounter (HOSPITAL_BASED_OUTPATIENT_CLINIC_OR_DEPARTMENT_OTHER)

## 2024-10-23 ENCOUNTER — Ambulatory Visit (HOSPITAL_BASED_OUTPATIENT_CLINIC_OR_DEPARTMENT_OTHER): Admitting: Physical Therapy

## 2024-10-30 ENCOUNTER — Encounter (HOSPITAL_BASED_OUTPATIENT_CLINIC_OR_DEPARTMENT_OTHER): Payer: Self-pay | Admitting: Physical Therapy

## 2024-10-30 ENCOUNTER — Ambulatory Visit (HOSPITAL_BASED_OUTPATIENT_CLINIC_OR_DEPARTMENT_OTHER): Admitting: Physical Therapy

## 2024-10-30 DIAGNOSIS — R29898 Other symptoms and signs involving the musculoskeletal system: Secondary | ICD-10-CM

## 2024-10-30 DIAGNOSIS — M542 Cervicalgia: Secondary | ICD-10-CM | POA: Diagnosis not present

## 2024-10-30 DIAGNOSIS — M5459 Other low back pain: Secondary | ICD-10-CM

## 2024-10-30 DIAGNOSIS — M25511 Pain in right shoulder: Secondary | ICD-10-CM

## 2024-10-30 NOTE — Therapy (Signed)
 " OUTPATIENT PHYSICAL THERAPY CERVICAL TREATMENT   Patient Name: Victoria Delacruz MRN: [id number] DOB:29-Nov-1970, 53 y.o., female Today's Date: 10/30/2024  END OF SESSION:   PT End of Session - 10/30/24 1018     Visit Number 4    Number of Visits 12    Date for Recertification  11/22/24    Authorization Type [employer] MCR    PT Start Time 1020    PT Stop Time 1100    PT Time Calculation (min) 40 min    Activity Tolerance Patient tolerated treatment well    Behavior During Therapy [facility] for tasks assessed/performed           Past Medical History:  Diagnosis Date   Anemia    Arthritis    Blood transfusion 05/17/11, 05/25/11   anemia   Breast cancer (HCC) 2004 and 2011   Depression    Dizziness    ECTOPIC PREGNANCY 1997 and 2011   x 2   Family history of breast cancer    grandmother   Fibroid    Fibromyalgia    Hemorrhoids 2020   Hypertension    Personal history of radiation therapy 2011   Past Surgical History:  Procedure Laterality Date   ABDOMINAL HYSTERECTOMY     AUGMENTATION MAMMAPLASTY Left    BREAST BIOPSY Left 07/05/2003   malignant   BREAST BIOPSY Left 2011   malignant   EVALUATION UNDER ANESTHESIA WITH HEMORRHOIDECTOMY N/A 05/17/2019   Procedure: HEMORRHOIDECTOMY, HEMORRHOID LIGATION/PEXY, ANORECTAL EXAM UNDER ANESTHESIA., REMOVAL ANAL CRYPT POLYP;  Surgeon: Sheldon Standing, MD;  Location: [facility];  Service: General;  Laterality: N/A;   LAPAROSCOPIC ASSISTED VAGINAL HYSTERECTOMY  08/30/2011   Procedure: LAPAROSCOPIC ASSISTED VAGINAL HYSTERECTOMY;  Surgeon: Gloris DELENA Hugger, MD;  Location: [facility];  Service: Gynecology;  Laterality: N/A;   LAPAROSCOPY FOR ECTOPIC PREGNANCY     LAPAROSCOPY W/ MINI-LAPAROTOMY     MASTECTOMY Left 2001   complete with reconstruction x 3, no b/p punctures to left arm   RECONSTRUCTION BREAST IMMEDIATE / DELAYED W/ TISSUE EXPANDER  2004   REDUCTION MAMMAPLASTY Right    Patient Active Problem List   Diagnosis Date  Noted   Rectal bleeding 04/17/2014   Neck muscle spasm 02/20/2014   Hypokalemia 07/12/2013   Health care maintenance 07/12/2013   IBS (irritable bowel syndrome) 03/06/2013   Depression 01/20/2013   Abdominal pain in female patient 09/21/2012   S/P laparoscopic assisted vaginal hysterectomy (LAVH) 09/30/2011   Fibromyalgia 06/22/2011   History of ectopic pregnancy 05/28/2011   Night sweats 03/25/2011   Fatigue 03/25/2011   Nipple discharge 03/25/2011   Shortness of breath 03/25/2011   Family history of breast cancer 03/25/2011   Essential hypertension 01/28/2011   Hand pain 01/28/2011   Neuropathy 01/28/2011   MIGRAINE HEADACHE 04/14/2010   Malignant neoplasm of female breast (HCC) 03/04/2010   ANEMIA, IRON DEFICIENCY 03/04/2010    PCP: Teresa Channel, MD  REFERRING PROVIDER: Teresa Channel, MD  REFERRING DIAG: M50.30 (ICD-10-CM) - Other cervical disc degeneration, unspecified cervical region M79.7 (ICD-10-CM) - Fibromyalgia  THERAPY DIAG:  Cervicalgia  Other symptoms and signs involving the musculoskeletal system  Bilateral shoulder pain, unspecified chronicity  Other low back pain  Rationale for Evaluation and Treatment: Rehabilitation  ONSET DATE: Chronic pain / PT order 07/12/2024  SUBJECTIVE:  SUBJECTIVE STATEMENT: Before spin class 10/10.  7-8/10 right now.  Pushed through. Do spin 2-3 x week. Strength training 1 x week   Pt reports she met with neurologist who wants her to continue with PT and then maybe try injections into neck mm to help with pain.  Pt doesn't have any injections scheduled and wants to try PT first.  She felt good after prior treatment.  Pt felt relief after the manual treatments.  Pt is taking care of her niece who has special needs.  She has  increased back pain when taking care of niece.  Pt reports compliance with HEP.    Hand dominance: Left  PERTINENT HISTORY:  HTN, migraines/H/A's, anxiety and depression Breast CA 2004, 2011 with surgery, radiation, and chemo liver lesion dating back 10 years which is not cancerous  PAIN:  NPRS:  6-7/10 current / 8/10 ; 12/10 worst, 4/10 best   Location:  R sided lumbar / cervical, bilat shoulder  Pt states her hands hurt all the time.  Pt denies any shooting pain down Ue's and denies any N/T.   PRECAUTIONS: None     WEIGHT BEARING RESTRICTIONS: No  FALLS:  Has patient fallen in last 6 months? No  LIVING ENVIRONMENT: Lives with: lives with their spouse Lives in: ranch style home with a bonus room upstairs Stairs: yes    OCCUPATION:  Pt is on disability.   PLOF: Independent  PATIENT GOALS: to reduce pain, to learn exercises to improve pain   OBJECTIVE:  Note: Objective measures were completed at Evaluation unless otherwise noted.  DIAGNOSTIC FINDINGS:  MRI in 12/24: FINDINGS: Alignment: Straightening of the normal cervical lordosis. No listhesis.   Vertebrae: Vertebral body height maintained without acute or chronic fracture. Bone marrow signal intensity overall within normal limits. No worrisome osseous lesions or abnormal marrow edema.   Cord: Normal signal and morphology.   Posterior Fossa, vertebral arteries, paraspinal tissues: Unremarkable.   Disc levels:   C2-C3: Unremarkable.   C3-C4: Tiny central disc protrusion mildly indents the ventral thecal sac. Mild right-sided uncovertebral spurring. No spinal stenosis. Foramina remain patent.   C4-C5: Small central disc protrusion indents the ventral thecal sac (series 10, image 18). No significant spinal stenosis. Foramina remain patent.   C5-C6: Left eccentric disc osteophyte complex. Posterior component flattens and partially effaces the ventral thecal sac without significant spinal stenosis.  Moderate left C6 foraminal narrowing. Right neural foramina remains patent.   C6-C7: Mild disc bulge with uncovertebral spurring. No spinal stenosis. Foramina remain adequately patent.   C7-T1: Normal interspace. Mild left-sided facet hypertrophy. No canal or foraminal stenosis.   IMPRESSION: 1. Normal MRI appearance of the cervical spinal cord. 2. Left eccentric disc osteophyte complex at C5-6 with resultant moderate left C6 foraminal stenosis. 3. Additional mild noncompressive disc bulging at C3-4 through C6-7 without significant stenosis or neural impingement.  PATIENT SURVEYS:  NDI:  56  COGNITION: Overall cognitive status: Within functional limits for tasks assessed    PALPATION: TTP:  L > R UT and cervical paraspinals.  Cervical SP's  Pt has soft tissue tightness in cervical paraspinals and UT.  CERVICAL ROM:   Active ROM A/PROM (deg) eval  Flexion WFL with pain on L sided cervical  Extension 40 deg  Right lateral flexion 36 with pain  Left lateral flexion 24 with pain  Right rotation WFL  Left rotation 80% with pain   (Blank rows = not tested)  UPPER EXTREMITY ROM:  Active ROM Right eval  Left eval  Shoulder flexion 164 with pain 142 with pain  Shoulder extension    Shoulder abduction 113 with pain 87 with pain  Shoulder adduction    Shoulder extension    Shoulder internal rotation    Shoulder external rotation    Elbow flexion    Elbow extension    Wrist flexion    Wrist extension    Wrist ulnar deviation    Wrist radial deviation    Wrist pronation    Wrist supination     (Blank rows = not tested)  UPPER EXTREMITY MMT:  MMT Right eval Left eval  Shoulder flexion 5/5 5/5  Shoulder extension    Shoulder scaption 5/5 weak  Shoulder adduction    Shoulder extension    Shoulder internal rotation    Shoulder external rotation    Middle trapezius    Lower trapezius    Elbow flexion 5/5 5/5  Elbow extension 5/5 seated 5/5 seated  Wrist  flexion    Wrist extension    Wrist ulnar deviation    Wrist radial deviation    Wrist pronation    Wrist supination    Grip strength     (Blank rows = not tested)    TREATMENT:                                                                                                                                [facility] Adult PT Treatment:                                                DATE: 10/30/24 Pt seen for aquatic therapy today.  Treatment took place in water 3.5-4.75 ft in depth at the [facility] pool. Temp of water was 91.  Pt entered/exited the pool via stairs using alternating pattern with hand rail.  *Intro to setting *walking forward and back in 3.6 ft with unsupported; added row motion (c/o upper arm pain).  Varied submersion depth/ shoulder flex with slight reduction in pain *side stepping with ue horizontal add/abd using RB HB; add/abd without HB *standing 4.6 ft: shoulder horizontal add/abd on surface; add/abd to surface; row motion.  All unsupported *cycling on straddled noodle  Pt requires the buoyancy and hydrostatic pressure of water for support, and to offload joints by unweighting joint load by at least 50 % in navel deep water and by at least 75-80% in chest to neck deep water.  Viscosity of the water is needed for resistance of strengthening. Water current perturbations provides challenge to standing balance requiring increased core activation.      12/11: Cervical retraction x 10 Cervical rotation x10 each Seated UT stretch 2x20 sec bilat Seated LS stretch 2x20 sec bilat Supine lumbar rotation  Supine piriformis stretch 3x20 sec bilat  STM to bilat cervical  paraspinals in supine Suboccipital release in supine Static cupping to bilat UT in sitting   12/4: Cupping to bil UT and cervical ps STM to bil UT UT stretching 30sec ea LS stretching 30sec ea Chin tucks 5 x10 seated Cervical rotation with nodding x10ea Sub occipital release in  supine Instruction in theracane use  Eval: Pt performed cervical retractions 2x10, shoulder rolls x 10 reps, and scap retractions.  Pt received a HEP handout and was instructed in correct form and appropriate frequency.   PATIENT EDUCATION:  Education details: dx, objective findings, HEP, relevant anatomy, POC, rationale of interventions, prognosis, and what to expect next treatment.   Person educated: Patient Education method: Explanation, Demonstration, Tactile cues, Verbal cues, and Handouts Education comprehension: verbalized understanding, returned demonstration, verbal cues required, tactile cues required, and needs further education  HOME EXERCISE PROGRAM: Access Code: [email or web address] URL: [email or web address] Date: 08/29/2024 Prepared by: Mose Minerva  Exercises - Seated Cervical Retraction  - 2 x daily - 7 x weekly - 2 sets - 10 reps - Standing Scapular Retraction  - 2 x daily - 7 x weekly - 2 sets - 10 reps - Standing Backward Shoulder Rolls  - 2 x daily - 7 x weekly - 2 sets - 10 reps  ASSESSMENT:  CLINICAL IMPRESSION: Pt demonstrates safety and independence in aquatic setting with therapist instructing from deck. She is confident in setting, moving throughout all depths easily.  Pt is directed through various movement patterns and trials in both sitting and standing positions.   She is provided VC and demonstration throughout session for execution of exercises while monitoring toleration.Cueing given throughout for relaxed posture particularly in shoulders and to slow pacing of exercise. With repetition of shoulder movement supported/assistance by buoyancy and viscosity of water her ROM improves pain reduced.   Goals are ongoing.     Pt reports having increased back pain due to taking care of her niece who has special needs.  Pt performed exercises focused on posture, flexibility, and mobility.  PT provided instruction and cuing for correct form and she performed  exercises well with instruction and cuing.  PT performed STM and cupping to improve soft tissue tightness and mobility and to reduce pain and myofascial restrictions and adhesions.  Pt had an appropriate response to cupping.  PT instructed pt to drink plenty of water.  Pt tolerated treatment well.  Pt had no change in cervical pain though reported improved lumbar pain to 4-5/10 after treatment.  Pt should benefit from continued skilled PT to address impairments and goals and improve overall function.  OBJECTIVE IMPAIRMENTS: decreased activity tolerance, decreased ROM, decreased strength, increased fascial restrictions, impaired UE functional use, and pain.   ACTIVITY LIMITATIONS: bending and sleeping  PARTICIPATION LIMITATIONS: cleaning, driving, community activity, and yard work  PERSONAL FACTORS: Time since onset of injury/illness/exacerbation and 1-2 comorbidities: Anxiety and depression and Migraines/H/A's are also affecting patient's functional outcome.   REHAB POTENTIAL: Good  CLINICAL DECISION MAKING: Evolving/moderate complexity  EVALUATION COMPLEXITY: Moderate   GOALS:   SHORT TERM GOALS: Target date:  09/19/24  Pt will tolerate aquatic therapy without adverse effects for improved tolerance to activity, pain, and function.  Baseline:  Goal status: INITIAL  2.  Pt will report at least a 25% improvement in pain and sx's overall.  Baseline:  Goal status: INITIAL  3.  Pt will be independent and compliant with HEP for improved ROM, postural strength and stability, pain, and function.  Baseline:  Goal status: INITIAL  4.  Pt's worst pain will be no > 8/10. Baseline:  Goal status: INITIAL Target date:  09/26/24  5.  Pt will demo improved cervical AROM with L rotation to be [facility] and L Sb'ing to be at least 35 deg for improved stiffness and mobility.  Baseline:  Goal status: INITIAL    LONG TERM GOALS: Target date: 11/22/2024  Pt will be able to turn her head while  driving and drive her local distances without increased pain.  Baseline:  Goal status: INITIAL  2.  Pt will be able to take care of her flowers and plants without significant pain.  Baseline:  Goal status: INITIAL  3.  Pt will be independent with aquatic program in order to have a consistent routine to improve functional tolerance, strength, and functional mobility.  Baseline:  Goal status: INITIAL  4.  Pt will report at least a 70% improvement in pain and sx's overall and a 50-60% improvement in household cleaning.  Baseline:  Goal status: INITIAL     PLAN:  PT FREQUENCY: 2x/week  PT DURATION: 6 weeks  PLANNED INTERVENTIONS: [id number]- PT Re-evaluation, 97750- Physical Performance Testing, 97110-Therapeutic exercises, 97530- Therapeutic activity, [id number]- Neuromuscular re-education, 97535- Self Care, [id number]- Manual therapy, [id number]- Gait training, [phone]- Aquatic Therapy, [phone]- Electrical stimulation (unattended), [phone]- Electrical stimulation (manual), [id number]- Ultrasound, 79439 (1-2 muscles), [id number] (3+ muscles)- Dry Needling, Patient/Family education, Taping, Joint mobilization, Spinal mobilization, Cryotherapy, and Moist heat  PLAN FOR NEXT SESSION:  Pt to begin with aquatic therapy and then transition to alternating land based and aquatic therapy.  Postural stability and strengthening. STM and cupping to cervical paraspinals and UT.  Ronal Gaston) Sherah Lund MPT 10/30/2024 12:24 PM [facility] [address] [place], [place], [address] Phone: [phone]   Fax:  [phone]          "

## 2024-11-02 ENCOUNTER — Ambulatory Visit (HOSPITAL_BASED_OUTPATIENT_CLINIC_OR_DEPARTMENT_OTHER): Attending: Family Medicine | Admitting: Physical Therapy

## 2024-11-02 ENCOUNTER — Telehealth (HOSPITAL_BASED_OUTPATIENT_CLINIC_OR_DEPARTMENT_OTHER): Payer: Self-pay | Admitting: Physical Therapy

## 2024-11-02 DIAGNOSIS — M25512 Pain in left shoulder: Secondary | ICD-10-CM | POA: Insufficient documentation

## 2024-11-02 DIAGNOSIS — M25511 Pain in right shoulder: Secondary | ICD-10-CM | POA: Insufficient documentation

## 2024-11-02 DIAGNOSIS — R29898 Other symptoms and signs involving the musculoskeletal system: Secondary | ICD-10-CM | POA: Insufficient documentation

## 2024-11-02 DIAGNOSIS — M542 Cervicalgia: Secondary | ICD-10-CM | POA: Insufficient documentation

## 2024-11-02 DIAGNOSIS — M5459 Other low back pain: Secondary | ICD-10-CM | POA: Insufficient documentation

## 2024-11-02 NOTE — Telephone Encounter (Signed)
 Pt No-showed for her appt today.  PT called pt though pt did not answer.  PT left a message for her and reminded her of her next appt.   Leigh Minerva III PT, DPT 11/02/2024 10:43 AM

## 2024-11-06 ENCOUNTER — Ambulatory Visit (HOSPITAL_BASED_OUTPATIENT_CLINIC_OR_DEPARTMENT_OTHER): Admitting: Physical Therapy

## 2024-11-06 ENCOUNTER — Encounter (HOSPITAL_BASED_OUTPATIENT_CLINIC_OR_DEPARTMENT_OTHER): Payer: Self-pay | Admitting: Physical Therapy

## 2024-11-06 DIAGNOSIS — R29898 Other symptoms and signs involving the musculoskeletal system: Secondary | ICD-10-CM

## 2024-11-06 DIAGNOSIS — M25511 Pain in right shoulder: Secondary | ICD-10-CM | POA: Diagnosis present

## 2024-11-06 DIAGNOSIS — M542 Cervicalgia: Secondary | ICD-10-CM | POA: Diagnosis present

## 2024-11-06 DIAGNOSIS — M5459 Other low back pain: Secondary | ICD-10-CM

## 2024-11-06 DIAGNOSIS — M25512 Pain in left shoulder: Secondary | ICD-10-CM | POA: Diagnosis present

## 2024-11-06 NOTE — Therapy (Signed)
 " OUTPATIENT PHYSICAL THERAPY CERVICAL TREATMENT   Patient Name: Victoria Delacruz MRN: [id number] DOB:February 03, 1971, 54 y.o., female Today's Date: 11/07/2024  END OF SESSION:   PT End of Session - 11/06/24 1032     Visit Number 5    Number of Visits 12    Date for Recertification  11/22/24    Authorization Type [employer] MCR    PT Start Time 1023    PT Stop Time 1112    PT Time Calculation (min) 49 min    Activity Tolerance Patient tolerated treatment well    Behavior During Therapy [facility] for tasks assessed/performed            Past Medical History:  Diagnosis Date   Anemia    Arthritis    Blood transfusion 05/17/11, 05/25/11   anemia   Breast cancer (HCC) 2004 and 2011   Depression    Dizziness    ECTOPIC PREGNANCY 1997 and 2011   x 2   Family history of breast cancer    grandmother   Fibroid    Fibromyalgia    Hemorrhoids 2020   Hypertension    Personal history of radiation therapy 2011   Past Surgical History:  Procedure Laterality Date   ABDOMINAL HYSTERECTOMY     AUGMENTATION MAMMAPLASTY Left    BREAST BIOPSY Left 07/05/2003   malignant   BREAST BIOPSY Left 2011   malignant   EVALUATION UNDER ANESTHESIA WITH HEMORRHOIDECTOMY N/A 05/17/2019   Procedure: HEMORRHOIDECTOMY, HEMORRHOID LIGATION/PEXY, ANORECTAL EXAM UNDER ANESTHESIA., REMOVAL ANAL CRYPT POLYP;  Surgeon: Sheldon Standing, MD;  Location: [facility];  Service: General;  Laterality: N/A;   LAPAROSCOPIC ASSISTED VAGINAL HYSTERECTOMY  08/30/2011   Procedure: LAPAROSCOPIC ASSISTED VAGINAL HYSTERECTOMY;  Surgeon: Gloris DELENA Hugger, MD;  Location: [facility];  Service: Gynecology;  Laterality: N/A;   LAPAROSCOPY FOR ECTOPIC PREGNANCY     LAPAROSCOPY W/ MINI-LAPAROTOMY     MASTECTOMY Left 2001   complete with reconstruction x 3, no b/p punctures to left arm   RECONSTRUCTION BREAST IMMEDIATE / DELAYED W/ TISSUE EXPANDER  2004   REDUCTION MAMMAPLASTY Right    Patient Active Problem List   Diagnosis Date  Noted   Rectal bleeding 04/17/2014   Neck muscle spasm 02/20/2014   Hypokalemia 07/12/2013   Health care maintenance 07/12/2013   IBS (irritable bowel syndrome) 03/06/2013   Depression 01/20/2013   Abdominal pain in female patient 09/21/2012   S/P laparoscopic assisted vaginal hysterectomy (LAVH) 09/30/2011   Fibromyalgia 06/22/2011   History of ectopic pregnancy 05/28/2011   Night sweats 03/25/2011   Fatigue 03/25/2011   Nipple discharge 03/25/2011   Shortness of breath 03/25/2011   Family history of breast cancer 03/25/2011   Essential hypertension 01/28/2011   Hand pain 01/28/2011   Neuropathy 01/28/2011   MIGRAINE HEADACHE 04/14/2010   Malignant neoplasm of female breast (HCC) 03/04/2010   ANEMIA, IRON DEFICIENCY 03/04/2010    PCP: Teresa Channel, MD  REFERRING PROVIDER: Teresa Channel, MD  REFERRING DIAG: M50.30 (ICD-10-CM) - Other cervical disc degeneration, unspecified cervical region M79.7 (ICD-10-CM) - Fibromyalgia  THERAPY DIAG:  Cervicalgia  Other symptoms and signs involving the musculoskeletal system  Bilateral shoulder pain, unspecified chronicity  Other low back pain  Rationale for Evaluation and Treatment: Rehabilitation  ONSET DATE: Chronic pain / PT order 07/12/2024  SUBJECTIVE:  SUBJECTIVE STATEMENT: I'm hurting.  My whole body is hurting.  Pt did spin class this AM and is doing a strengthening class tomorrow.  Pt states she had to stop the class early this AM due to pain and did 13 miles on her spin bike.  Pt states she enjoyed aquatic therapy and felt pretty good afterwards.  She states it was easier to move in the water.  Pt states she felt fine after prior land based treatment.  Pt states she had some relief with the cupping.   Pt reports she has  been performing her HEP some.    Hand dominance: Left  PERTINENT HISTORY:  HTN, migraines/H/A's, anxiety and depression Breast CA 2004, 2011 with surgery, radiation, and chemo liver lesion dating back 10 years which is not cancerous  PAIN:  NPRS:  8/10 current / 8/10 ; 12/10 worst, 4/10 best   Location:  bilat lumbar flanks/ L > R cervical, L UE    PRECAUTIONS: None     WEIGHT BEARING RESTRICTIONS: No  FALLS:  Has patient fallen in last 6 months? No  LIVING ENVIRONMENT: Lives with: lives with their spouse Lives in: ranch style home with a bonus room upstairs Stairs: yes    OCCUPATION:  Pt is on disability.   PLOF: Independent  PATIENT GOALS: to reduce pain, to learn exercises to improve pain   OBJECTIVE:  Note: Objective measures were completed at Evaluation unless otherwise noted.  DIAGNOSTIC FINDINGS:  MRI in 12/24: FINDINGS: Alignment: Straightening of the normal cervical lordosis. No listhesis.   Vertebrae: Vertebral body height maintained without acute or chronic fracture. Bone marrow signal intensity overall within normal limits. No worrisome osseous lesions or abnormal marrow edema.   Cord: Normal signal and morphology.   Posterior Fossa, vertebral arteries, paraspinal tissues: Unremarkable.   Disc levels:   C2-C3: Unremarkable.   C3-C4: Tiny central disc protrusion mildly indents the ventral thecal sac. Mild right-sided uncovertebral spurring. No spinal stenosis. Foramina remain patent.   C4-C5: Small central disc protrusion indents the ventral thecal sac (series 10, image 18). No significant spinal stenosis. Foramina remain patent.   C5-C6: Left eccentric disc osteophyte complex. Posterior component flattens and partially effaces the ventral thecal sac without significant spinal stenosis. Moderate left C6 foraminal narrowing. Right neural foramina remains patent.   C6-C7: Mild disc bulge with uncovertebral spurring. No spinal stenosis.  Foramina remain adequately patent.   C7-T1: Normal interspace. Mild left-sided facet hypertrophy. No canal or foraminal stenosis.   IMPRESSION: 1. Normal MRI appearance of the cervical spinal cord. 2. Left eccentric disc osteophyte complex at C5-6 with resultant moderate left C6 foraminal stenosis. 3. Additional mild noncompressive disc bulging at C3-4 through C6-7 without significant stenosis or neural impingement.  PATIENT SURVEYS:  NDI:  11 NDI:  18  (11/06/24)  COGNITION: Overall cognitive status: Within functional limits for tasks assessed    PALPATION: TTP:  L > R UT and cervical paraspinals.  Cervical SP's  Pt has soft tissue tightness in cervical paraspinals and UT.  CERVICAL ROM:   Active ROM A/PROM (deg) eval  Flexion WFL with pain on L sided cervical  Extension 40 deg  Right lateral flexion 36 with pain  Left lateral flexion 24 with pain  Right rotation WFL  Left rotation 80% with pain   (Blank rows = not tested)  UPPER EXTREMITY ROM:  Active ROM Right eval Left eval  Shoulder flexion 164 with pain 142 with pain  Shoulder extension    Shoulder  abduction 113 with pain 87 with pain  Shoulder adduction    Shoulder extension    Shoulder internal rotation    Shoulder external rotation    Elbow flexion    Elbow extension    Wrist flexion    Wrist extension    Wrist ulnar deviation    Wrist radial deviation    Wrist pronation    Wrist supination     (Blank rows = not tested)  UPPER EXTREMITY MMT:  MMT Right eval Left eval  Shoulder flexion 5/5 5/5  Shoulder extension    Shoulder scaption 5/5 weak  Shoulder adduction    Shoulder extension    Shoulder internal rotation    Shoulder external rotation    Middle trapezius    Lower trapezius    Elbow flexion 5/5 5/5  Elbow extension 5/5 seated 5/5 seated  Wrist flexion    Wrist extension    Wrist ulnar deviation    Wrist radial deviation    Wrist pronation    Wrist supination    Grip  strength     (Blank rows = not tested)    TREATMENT:                                                                                                                                11/06/24  Reviewed pt presentation, pain levels, response to prior treatment, and HEP compliance.   Pulleys in flexion 2x10 and scaption approx 15 Cervical rotation 2x10-12 Rows with YTB x20 Seated UT stretch 2x20 sec bilat Seated LS stretch x 20 sec bilat Supine lumbar rotation x 10   STM to bilat UT and cervical paraspinals and static cupping to bilat UT  Pt answered questions for insurance authorization (see below)  and completed NDI (see above).      [facility] Adult PT Treatment:                                                DATE: 10/30/24 Pt seen for aquatic therapy today.  Treatment took place in water 3.5-4.75 ft in depth at the [facility] pool. Temp of water was 91.  Pt entered/exited the pool via stairs using alternating pattern with hand rail.  *Intro to setting *walking forward and back in 3.6 ft with unsupported; added row motion (c/o upper arm pain).  Varied submersion depth/ shoulder flex with slight reduction in pain *side stepping with ue horizontal add/abd using RB HB; add/abd without HB *standing 4.6 ft: shoulder horizontal add/abd on surface; add/abd to surface; row motion.  All unsupported *cycling on straddled noodle  Pt requires the buoyancy and hydrostatic pressure of water for support, and to offload joints by unweighting joint load by at least 50 % in navel deep water and by at least 75-80% in chest to neck  deep water.  Viscosity of the water is needed for resistance of strengthening. Water current perturbations provides challenge to standing balance requiring increased core activation.      12/11: Cervical retraction x 10 Cervical rotation x10 each Seated UT stretch 2x20 sec bilat Seated LS stretch 2x20 sec bilat Supine lumbar rotation  Supine piriformis stretch  3x20 sec bilat  STM to bilat cervical paraspinals in supine Suboccipital release in supine Static cupping to bilat UT in sitting   12/4: Cupping to bil UT and cervical ps STM to bil UT UT stretching 30sec ea LS stretching 30sec ea Chin tucks 5 x10 seated Cervical rotation with nodding x10ea Sub occipital release in supine Instruction in theracane use  Eval: Pt performed cervical retractions 2x10, shoulder rolls x 10 reps, and scap retractions.  Pt received a HEP handout and was instructed in correct form and appropriate frequency.   PATIENT EDUCATION:  Education details: dx, objective findings, HEP, relevant anatomy, POC, rationale of interventions, prognosis, and what to expect next treatment.   Person educated: Patient Education method: Explanation, Demonstration, Tactile cues, Verbal cues, and Handouts Education comprehension: verbalized understanding, returned demonstration, verbal cues required, tactile cues required, and needs further education  HOME EXERCISE PROGRAM: Access Code: [email or web address] URL: [email or web address] Date: 08/29/2024 Prepared by: Mose Minerva  Exercises - Seated Cervical Retraction  - 2 x daily - 7 x weekly - 2 sets - 10 reps - Standing Scapular Retraction  - 2 x daily - 7 x weekly - 2 sets - 10 reps - Standing Backward Shoulder Rolls  - 2 x daily - 7 x weekly - 2 sets - 10 reps  ASSESSMENT:  CLINICAL IMPRESSION: Pt presents to treatment with high levels of pain stating her whole body hurts.  PT performed exercises to improve pain, cervical and lumbar mobility, cervical flexibility, shoulder ROM, and posture.  PT instructed pt in correct form and positioning with exercises and she performed exercises well with cuing and instruction.  PT performed soft tissue work to UT and cervical paraspinals to improve soft tissue tightness and mobility and pain and to reduce myofascial restrictions.  Pt had an appropriate response to cupping and was  informed to drink plenty of water.  She has significant tightness in bilat UT.  Pt had minimal improvement in self perceived disability with NDI improving by 1 point though not clinically significant.  Pt states her neck feels a little better after , but back still hurts.  Pt should benefit from continued skilled PT to address impairments and goals and improve overall function.   OBJECTIVE IMPAIRMENTS: decreased activity tolerance, decreased ROM, decreased strength, increased fascial restrictions, impaired UE functional use, and pain.   ACTIVITY LIMITATIONS: bending and sleeping  PARTICIPATION LIMITATIONS: cleaning, driving, community activity, and yard work  PERSONAL FACTORS: Time since onset of injury/illness/exacerbation and 1-2 comorbidities: Anxiety and depression and Migraines/H/A's are also affecting patient's functional outcome.   REHAB POTENTIAL: Good  CLINICAL DECISION MAKING: Evolving/moderate complexity  EVALUATION COMPLEXITY: Moderate   GOALS:   SHORT TERM GOALS: Target date:  09/19/24  Pt will tolerate aquatic therapy without adverse effects for improved tolerance to activity, pain, and function.  Baseline:  Goal status: INITIAL  2.  Pt will report at least a 25% improvement in pain and sx's overall.  Baseline:  Goal status: INITIAL  3.  Pt will be independent and compliant with HEP for improved ROM, postural strength and stability, pain, and function.  Baseline:  Goal status: INITIAL  4.  Pt's worst pain will be no > 8/10. Baseline:  Goal status: INITIAL Target date:  09/26/24  5.  Pt will demo improved cervical AROM with L rotation to be [facility] and L Sb'ing to be at least 35 deg for improved stiffness and mobility.  Baseline:  Goal status: INITIAL    LONG TERM GOALS: Target date: 11/22/2024  Pt will be able to turn her head while driving and drive her local distances without increased pain.  Baseline:  Goal status: INITIAL  2.  Pt will be able to take  care of her flowers and plants without significant pain.  Baseline:  Goal status: INITIAL  3.  Pt will be independent with aquatic program in order to have a consistent routine to improve functional tolerance, strength, and functional mobility.  Baseline:  Goal status: INITIAL  4.  Pt will report at least a 70% improvement in pain and sx's overall and a 50-60% improvement in household cleaning.  Baseline:  Goal status: INITIAL     PLAN:  PT FREQUENCY: 2x/week  PT DURATION: 6 weeks  PLANNED INTERVENTIONS: 97164- PT Re-evaluation, 97750- Physical Performance Testing, 97110-Therapeutic exercises, 97530- Therapeutic activity, [id number]- Neuromuscular re-education, 97535- Self Care, [id number]- Manual therapy, [id number]- Gait training, [phone]- Aquatic Therapy, [phone]- Electrical stimulation (unattended), [phone]- Electrical stimulation (manual), [id number]- Ultrasound, 79439 (1-2 muscles), [id number] (3+ muscles)- Dry Needling, Patient/Family education, Taping, Joint mobilization, Spinal mobilization, Cryotherapy, and Moist heat  PLAN FOR NEXT SESSION:  Cont with aquatic therapy and land based therapy.  Postural stability and strengthening. STM and cupping to cervical paraspinals and UT.  Assess STG's next visit.      Date of referral: 07/12/2024 Referring provider: Teresa Channel, MD Referring diagnosis? M50.30 (ICD-10-CM) - Other cervical disc degeneration, unspecified cervical region M79.7 (ICD-10-CM) - Fibromyalgia Treatment diagnosis? (if different than referring diagnosis)  Cervicalgia   Other symptoms and signs involving the musculoskeletal system   Bilateral shoulder pain, unspecified chronicity   Other low back pain   What was this (referring dx) caused by? Ongoing Issue   Lysle of Condition: Chronic (continuous duration > 3 months)            Laterality: Both    Current Functional Measure Score: Neck Index 18  Objective measurements identify impairments when they are compared to normal  values, the uninvolved extremity, and prior level of function.  [x]  Yes  []  No  Objective assessment of functional ability: Moderate functional limitations   Briefly describe symptoms: Pt has pain in cervical, bilat shoulders and Ue's, and lumbar.  pain with driving and is unable to drive long distances.  Pt has pain with bending and taking care of flowers/plants.  Pt unable to clean her shower and vacuum.  Pt has pain with turning head.  Pt can have difficulty sleeping.   How did symptoms start: chronic pain   Average pain intensity:  Last 24 hours: 7/10  Past week: 9-10/10  How often does the pt experience symptoms? Constantly  How much have the symptoms interfered with usual daily activities? Quite a bit  How has condition changed since care began at this facility? A little better  In general, how is the patients overall health? Good   BACK PAIN (STarT Back Screening Tool) Pt has back pain and MD order is for cervical DDD and fibromyalgia.    Leigh Minerva III PT, DPT 11/07/2024 9:49 AM            "

## 2024-11-12 ENCOUNTER — Encounter (HOSPITAL_BASED_OUTPATIENT_CLINIC_OR_DEPARTMENT_OTHER): Payer: Self-pay

## 2024-11-12 ENCOUNTER — Ambulatory Visit (HOSPITAL_BASED_OUTPATIENT_CLINIC_OR_DEPARTMENT_OTHER)

## 2024-11-15 ENCOUNTER — Encounter (HOSPITAL_BASED_OUTPATIENT_CLINIC_OR_DEPARTMENT_OTHER): Payer: Self-pay | Admitting: Physical Therapy

## 2024-11-15 ENCOUNTER — Ambulatory Visit (HOSPITAL_BASED_OUTPATIENT_CLINIC_OR_DEPARTMENT_OTHER): Admitting: Physical Therapy

## 2024-11-15 DIAGNOSIS — M5459 Other low back pain: Secondary | ICD-10-CM

## 2024-11-15 DIAGNOSIS — M25511 Pain in right shoulder: Secondary | ICD-10-CM

## 2024-11-15 DIAGNOSIS — R29898 Other symptoms and signs involving the musculoskeletal system: Secondary | ICD-10-CM

## 2024-11-15 DIAGNOSIS — M542 Cervicalgia: Secondary | ICD-10-CM

## 2024-11-15 NOTE — Therapy (Signed)
 " OUTPATIENT PHYSICAL THERAPY CERVICAL TREATMENT   Patient Name: Victoria Delacruz MRN: [id number] DOB:17-Sep-1971, 54 y.o., female Today's Date: 11/15/2024  END OF SESSION:   PT End of Session - 11/15/24 1025     Visit Number 6    Number of Visits 12    Date for Recertification  11/22/24    Authorization Type [employer] [facility]    Authorization Time Period 11/02/24-11/30/24    Authorization - Visit Number 2    Authorization - Number of Visits 3    PT Start Time 1015    PT Stop Time 1055    PT Time Calculation (min) 40 min    Behavior During Therapy [facility] for tasks assessed/performed            Past Medical History:  Diagnosis Date   Anemia    Arthritis    Blood transfusion 05/17/11, 05/25/11   anemia   Breast cancer (HCC) 2004 and 2011   Depression    Dizziness    ECTOPIC PREGNANCY 1997 and 2011   x 2   Family history of breast cancer    grandmother   Fibroid    Fibromyalgia    Hemorrhoids 2020   Hypertension    Personal history of radiation therapy 2011   Past Surgical History:  Procedure Laterality Date   ABDOMINAL HYSTERECTOMY     AUGMENTATION MAMMAPLASTY Left    BREAST BIOPSY Left 07/05/2003   malignant   BREAST BIOPSY Left 2011   malignant   EVALUATION UNDER ANESTHESIA WITH HEMORRHOIDECTOMY N/A 05/17/2019   Procedure: HEMORRHOIDECTOMY, HEMORRHOID LIGATION/PEXY, ANORECTAL EXAM UNDER ANESTHESIA., REMOVAL ANAL CRYPT POLYP;  Surgeon: Sheldon Standing, MD;  Location: [facility];  Service: General;  Laterality: N/A;   LAPAROSCOPIC ASSISTED VAGINAL HYSTERECTOMY  08/30/2011   Procedure: LAPAROSCOPIC ASSISTED VAGINAL HYSTERECTOMY;  Surgeon: Gloris DELENA Hugger, MD;  Location: [facility];  Service: Gynecology;  Laterality: N/A;   LAPAROSCOPY FOR ECTOPIC PREGNANCY     LAPAROSCOPY W/ MINI-LAPAROTOMY     MASTECTOMY Left 2001   complete with reconstruction x 3, no b/p punctures to left arm   RECONSTRUCTION BREAST IMMEDIATE / DELAYED W/ TISSUE EXPANDER  2004   REDUCTION  MAMMAPLASTY Right    Patient Active Problem List   Diagnosis Date Noted   Rectal bleeding 04/17/2014   Neck muscle spasm 02/20/2014   Hypokalemia 07/12/2013   Health care maintenance 07/12/2013   IBS (irritable bowel syndrome) 03/06/2013   Depression 01/20/2013   Abdominal pain in female patient 09/21/2012   S/P laparoscopic assisted vaginal hysterectomy (LAVH) 09/30/2011   Fibromyalgia 06/22/2011   History of ectopic pregnancy 05/28/2011   Night sweats 03/25/2011   Fatigue 03/25/2011   Nipple discharge 03/25/2011   Shortness of breath 03/25/2011   Family history of breast cancer 03/25/2011   Essential hypertension 01/28/2011   Hand pain 01/28/2011   Neuropathy 01/28/2011   MIGRAINE HEADACHE 04/14/2010   Malignant neoplasm of female breast (HCC) 03/04/2010   ANEMIA, IRON DEFICIENCY 03/04/2010    PCP: Teresa Channel, MD  REFERRING PROVIDER: Teresa Channel, MD  REFERRING DIAG: M50.30 (ICD-10-CM) - Other cervical disc degeneration, unspecified cervical region M79.7 (ICD-10-CM) - Fibromyalgia  THERAPY DIAG:  Cervicalgia  Other symptoms and signs involving the musculoskeletal system  Bilateral shoulder pain, unspecified chronicity  Other low back pain  Rationale for Evaluation and Treatment: Rehabilitation  ONSET DATE: Chronic pain / PT order 07/12/2024  SUBJECTIVE:  SUBJECTIVE STATEMENT: Pt states she had some relief with the cupping and she did well with last aquatic therapy session.   Plans to begin using pool at [facility].   Pt reports she has been performing her HEP some.    Hand dominance: Left  PERTINENT HISTORY:  HTN, migraines/H/A's, anxiety and depression Breast CA 2004, 2011 with surgery, radiation, and chemo liver lesion dating back 10 years which is not  cancerous  PAIN:  NPRS:  8/10 current / 8/10 ; 12/10 worst, 4/10 best   Location:  bilat lumbar flanks/ L > R cervical, L UE    PRECAUTIONS: None     WEIGHT BEARING RESTRICTIONS: No  FALLS:  Has patient fallen in last 6 months? No  LIVING ENVIRONMENT: Lives with: lives with their spouse Lives in: ranch style home with a bonus room upstairs Stairs: yes    OCCUPATION:  Pt is on disability.   PLOF: Independent  PATIENT GOALS: to reduce pain, to learn exercises to improve pain   OBJECTIVE:  Note: Objective measures were completed at Evaluation unless otherwise noted.  DIAGNOSTIC FINDINGS:  MRI in 12/24: FINDINGS: Alignment: Straightening of the normal cervical lordosis. No listhesis.   Vertebrae: Vertebral body height maintained without acute or chronic fracture. Bone marrow signal intensity overall within normal limits. No worrisome osseous lesions or abnormal marrow edema.   Cord: Normal signal and morphology.   Posterior Fossa, vertebral arteries, paraspinal tissues: Unremarkable.   Disc levels:   C2-C3: Unremarkable.   C3-C4: Tiny central disc protrusion mildly indents the ventral thecal sac. Mild right-sided uncovertebral spurring. No spinal stenosis. Foramina remain patent.   C4-C5: Small central disc protrusion indents the ventral thecal sac (series 10, image 18). No significant spinal stenosis. Foramina remain patent.   C5-C6: Left eccentric disc osteophyte complex. Posterior component flattens and partially effaces the ventral thecal sac without significant spinal stenosis. Moderate left C6 foraminal narrowing. Right neural foramina remains patent.   C6-C7: Mild disc bulge with uncovertebral spurring. No spinal stenosis. Foramina remain adequately patent.   C7-T1: Normal interspace. Mild left-sided facet hypertrophy. No canal or foraminal stenosis.   IMPRESSION: 1. Normal MRI appearance of the cervical spinal cord. 2. Left eccentric disc  osteophyte complex at C5-6 with resultant moderate left C6 foraminal stenosis. 3. Additional mild noncompressive disc bulging at C3-4 through C6-7 without significant stenosis or neural impingement.  PATIENT SURVEYS:  NDI:  74 NDI:  18  (11/06/24)  COGNITION: Overall cognitive status: Within functional limits for tasks assessed    PALPATION: TTP:  L > R UT and cervical paraspinals.  Cervical SP's  Pt has soft tissue tightness in cervical paraspinals and UT.  CERVICAL ROM:   Active ROM A/PROM (deg) eval  Flexion WFL with pain on L sided cervical  Extension 40 deg  Right lateral flexion 36 with pain  Left lateral flexion 24 with pain  Right rotation WFL  Left rotation 80% with pain   (Blank rows = not tested)  UPPER EXTREMITY ROM:  Active ROM Right eval Left eval  Shoulder flexion 164 with pain 142 with pain  Shoulder extension    Shoulder abduction 113 with pain 87 with pain  Shoulder adduction    Shoulder extension    Shoulder internal rotation    Shoulder external rotation    Elbow flexion    Elbow extension    Wrist flexion    Wrist extension    Wrist ulnar deviation    Wrist radial deviation  Wrist pronation    Wrist supination     (Blank rows = not tested)  UPPER EXTREMITY MMT:  MMT Right eval Left eval  Shoulder flexion 5/5 5/5  Shoulder extension    Shoulder scaption 5/5 weak  Shoulder adduction    Shoulder extension    Shoulder internal rotation    Shoulder external rotation    Middle trapezius    Lower trapezius    Elbow flexion 5/5 5/5  Elbow extension 5/5 seated 5/5 seated  Wrist flexion    Wrist extension    Wrist ulnar deviation    Wrist radial deviation    Wrist pronation    Wrist supination    Grip strength     (Blank rows = not tested)    TREATMENT:                                                                                                                               [facility] Adult PT Treatment:                                                 DATE: 11/15/24 Pt seen for aquatic therapy today.  Treatment took place in water 3.5-4.75 ft in depth at the [facility] pool. Temp of water was 91.  Pt entered/exited the pool via stairs using alternating pattern with hand rail.  *walking forward and backward in 3.6 ft with unsupported; added row motion with  * horiz abdct/ add with rainbow hand floats-> no floats, hands under water (improved tolerance) * bil shoulder flexion/extension (under water) * bil shoudler ER/IR * marching with reciprocal arm swing * TrA set with 1/2 hollow noodle pull down in standard stance and staggered stance *side stepping with ue horizontal add/abd using short noodles *cycling on straddled noodle, UE on corner  11/06/24  Reviewed pt presentation, pain levels, response to prior treatment, and HEP compliance.   Pulleys in flexion 2x10 and scaption approx 15 Cervical rotation 2x10-12 Rows with YTB x20 Seated UT stretch 2x20 sec bilat Seated LS stretch x 20 sec bilat Supine lumbar rotation x 10   STM to bilat UT and cervical paraspinals and static cupping to bilat UT  Pt answered questions for insurance authorization (see below)  and completed NDI (see above).      [facility] Adult PT Treatment:                                                DATE: 10/30/24 Pt seen for aquatic therapy today.  Treatment took place in water 3.5-4.75 ft in depth at the [facility] pool. Temp of water was 91.  Pt entered/exited the pool via stairs  using alternating pattern with hand rail.  *Intro to setting *walking forward and back in 3.6 ft with unsupported; added row motion (c/o upper arm pain).  Varied submersion depth/ shoulder flex with slight reduction in pain *side stepping with ue horizontal add/abd using RB HB; add/abd without HB *standing 4.6 ft: shoulder horizontal add/abd on surface; add/abd to surface; row motion.  All unsupported *cycling on straddled noodle  Pt requires the  buoyancy and hydrostatic pressure of water for support, and to offload joints by unweighting joint load by at least 50 % in navel deep water and by at least 75-80% in chest to neck deep water.  Viscosity of the water is needed for resistance of strengthening. Water current perturbations provides challenge to standing balance requiring increased core activation.      12/11: Cervical retraction x 10 Cervical rotation x10 each Seated UT stretch 2x20 sec bilat Seated LS stretch 2x20 sec bilat Supine lumbar rotation  Supine piriformis stretch 3x20 sec bilat  STM to bilat cervical paraspinals in supine Suboccipital release in supine Static cupping to bilat UT in sitting   12/4: Cupping to bil UT and cervical ps STM to bil UT UT stretching 30sec ea LS stretching 30sec ea Chin tucks 5 x10 seated Cervical rotation with nodding x10ea Sub occipital release in supine Instruction in theracane use  Eval: Pt performed cervical retractions 2x10, shoulder rolls x 10 reps, and scap retractions.  Pt received a HEP handout and was instructed in correct form and appropriate frequency.   PATIENT EDUCATION:  Education details: intro to aquatic therapy   Person educated: Patient Education method: Explanation, Demonstration, Tactile cues, Verbal cues, and Handouts Education comprehension: verbalized understanding, returned demonstration, verbal cues required, tactile cues required, and needs further education  HOME EXERCISE PROGRAM: Access Code: [email or web address] URL: [email or web address] Date: 08/29/2024 Prepared by: Mose Minerva  Exercises - Seated Cervical Retraction  - 2 x daily - 7 x weekly - 2 sets - 10 reps - Standing Scapular Retraction  - 2 x daily - 7 x weekly - 2 sets - 10 reps - Standing Backward Shoulder Rolls  - 2 x daily - 7 x weekly - 2 sets - 10 reps  ASSESSMENT:  CLINICAL IMPRESSION: Pt required frequent cues to relax upper traps. Pt did better with light  resistance of short hollow noodles or just arms moving through water.  Pt reported gradual reduction of pain during session.  Most pain relief was reported with suspended cycling at end of session.  Pt would benefit from additional visits to establish aquatic HEP that she can continue at local pool.  Pt should benefit from continued skilled PT to address impairments and goals and improve overall function. Pt has met STG1.  Therapist to check goals as time allows next visit.  Pt is authorized for one more visit; will need peer to peer review if appropriate for additional visits.    OBJECTIVE IMPAIRMENTS: decreased activity tolerance, decreased ROM, decreased strength, increased fascial restrictions, impaired UE functional use, and pain.   ACTIVITY LIMITATIONS: bending and sleeping  PARTICIPATION LIMITATIONS: cleaning, driving, community activity, and yard work  PERSONAL FACTORS: Time since onset of injury/illness/exacerbation and 1-2 comorbidities: Anxiety and depression and Migraines/H/A's are also affecting patient's functional outcome.   REHAB POTENTIAL: Good  CLINICAL DECISION MAKING: Evolving/moderate complexity  EVALUATION COMPLEXITY: Moderate   GOALS:   SHORT TERM GOALS: Target date:  09/19/24  Pt will tolerate aquatic therapy without adverse effects for improved tolerance to activity, pain, and function.  Baseline:  Goal status: MET- 11/15/24  2.  Pt will report at least a 25% improvement in pain and sx's overall.  Baseline:  Goal status: INITIAL  3.  Pt will be independent and compliant with HEP for improved ROM, postural strength and stability, pain, and function.  Baseline:  Goal status: INITIAL  4.  Pt's worst pain will be no > 8/10. Baseline:  Goal status: INITIAL Target date:  09/26/24  5.  Pt will demo improved cervical AROM with L rotation to be [facility] and L Sb'ing to be at least 35 deg for improved stiffness and mobility.  Baseline:  Goal status: In progress -  11/15/24    LONG TERM GOALS: Target date: 11/22/2024  Pt will be able to turn her head while driving and drive her local distances without increased pain.  Baseline:  Goal status: INITIAL  2.  Pt will be able to take care of her flowers and plants without significant pain.  Baseline:  Goal status: INITIAL  3.  Pt will be independent with aquatic program in order to have a consistent routine to improve functional tolerance, strength, and functional mobility.  Baseline:  Goal status: INITIAL  4.  Pt will report at least a 70% improvement in pain and sx's overall and a 50-60% improvement in household cleaning.  Baseline:  Goal status: INITIAL     PLAN:  PT FREQUENCY: 2x/week  PT DURATION: 6 weeks  PLANNED INTERVENTIONS: [id number]- PT Re-evaluation, 97750- Physical Performance Testing, 97110-Therapeutic exercises, 97530- Therapeutic activity, [id number]- Neuromuscular re-education, 97535- Self Care, [id number]- Manual therapy, [id number]- Gait training, [phone]- Aquatic Therapy, [phone]- Electrical stimulation (unattended), [phone]- Electrical stimulation (manual), [id number]- Ultrasound, 79439 (1-2 muscles), [id number] (3+ muscles)- Dry Needling, Patient/Family education, Taping, Joint mobilization, Spinal mobilization, Cryotherapy, and Moist heat  PLAN FOR NEXT SESSION:  Cont with aquatic therapy and land based therapy.  Postural stability and strengthening. STM and cupping to cervical paraspinals and UT.  Assess STG's next visit.   Delon Aquas, PTA 11/15/24 12:40 PM [facility] [address] [place], [place], [address] Phone: [phone]   Fax:  [phone]    Date of referral: 07/12/2024 Referring provider: Teresa Channel, MD Referring diagnosis? M50.30 (ICD-10-CM) - Other cervical disc degeneration, unspecified cervical region M79.7 (ICD-10-CM) - Fibromyalgia Treatment diagnosis? (if different than referring diagnosis)  Cervicalgia   Other  symptoms and signs involving the musculoskeletal system   Bilateral shoulder pain, unspecified chronicity   Other low back pain   What was this (referring dx) caused by? Ongoing Issue   Lysle of Condition: Chronic (continuous duration > 3 months)            Laterality: Both    Current Functional Measure Score: Neck Index 18  Objective measurements identify impairments when they are compared to normal values, the uninvolved extremity, and prior level of function.  [x]  Yes  []  No  Objective assessment of functional ability: Moderate functional limitations   Briefly describe symptoms: Pt has pain in cervical, bilat shoulders and Ue's, and lumbar.  pain with driving and is unable to drive long distances.  Pt has pain with bending and taking care of flowers/plants.  Pt unable to clean her shower and vacuum.  Pt has pain with turning head.  Pt can have difficulty sleeping.   How did symptoms start: chronic pain   Average pain intensity:  Last 24 hours: 7/10  Past week: 9-10/10  How often does the pt experience symptoms? Constantly  How much  have the symptoms interfered with usual daily activities? Quite a bit  How has condition changed since care began at this facility? A little better  In general, how is the patients overall health? Good   BACK PAIN (STarT Back Screening Tool) Pt has back pain and MD order is for cervical DDD and fibromyalgia.            "

## 2024-11-22 ENCOUNTER — Telehealth (HOSPITAL_BASED_OUTPATIENT_CLINIC_OR_DEPARTMENT_OTHER): Payer: Self-pay | Admitting: Physical Therapy

## 2024-11-22 NOTE — Telephone Encounter (Signed)
 LVM offering open slots 1/22 & advised of offers in [place]. Requested call back   Victoria Delacruz C. Lyla Jasek PT, DPT 11/22/24 7:58 AM

## 2025-04-03 ENCOUNTER — Ambulatory Visit: Admitting: Neurology

## 2025-06-28 ENCOUNTER — Other Ambulatory Visit

## 2025-06-28 ENCOUNTER — Encounter: Admitting: Nurse Practitioner
